# Patient Record
Sex: Female | Born: 1985 | Race: Black or African American | Hispanic: No | Marital: Single | Smoking: Current every day smoker
Health system: Southern US, Community
[De-identification: ages and names within clinical notes are randomized; demographics above are authoritative.]

## PROBLEM LIST (undated history)

## (undated) ENCOUNTER — Inpatient Hospital Stay (HOSPITAL_COMMUNITY): Payer: Self-pay

## (undated) ENCOUNTER — Inpatient Hospital Stay (HOSPITAL_COMMUNITY): Payer: Medicaid Other | Admitting: Obstetrics & Gynecology

## (undated) DIAGNOSIS — N750 Cyst of Bartholin's gland: Secondary | ICD-10-CM

## (undated) DIAGNOSIS — A64 Unspecified sexually transmitted disease: Secondary | ICD-10-CM

## (undated) DIAGNOSIS — R51 Headache: Secondary | ICD-10-CM

## (undated) DIAGNOSIS — F1910 Other psychoactive substance abuse, uncomplicated: Secondary | ICD-10-CM

## (undated) DIAGNOSIS — Z8742 Personal history of other diseases of the female genital tract: Secondary | ICD-10-CM

## (undated) DIAGNOSIS — R87629 Unspecified abnormal cytological findings in specimens from vagina: Secondary | ICD-10-CM

## (undated) DIAGNOSIS — N83209 Unspecified ovarian cyst, unspecified side: Secondary | ICD-10-CM

## (undated) HISTORY — PX: FRACTURE SURGERY: SHX138

## (undated) HISTORY — DX: Unspecified sexually transmitted disease: A64

## (undated) HISTORY — PX: WISDOM TOOTH EXTRACTION: SHX21

## (undated) HISTORY — PX: DILATION AND CURETTAGE OF UTERUS: SHX78

## (undated) HISTORY — PX: LAPAROSCOPY: SHX197

## (undated) HISTORY — DX: Personal history of other diseases of the female genital tract: Z87.42

---
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
## 2005-01-23 ENCOUNTER — Emergency Department (HOSPITAL_COMMUNITY): Admission: EM | Admit: 2005-01-23 | Discharge: 2005-01-23 | Payer: Self-pay | Admitting: Emergency Medicine

## 2005-09-10 ENCOUNTER — Emergency Department (HOSPITAL_COMMUNITY): Admission: EM | Admit: 2005-09-10 | Discharge: 2005-09-10 | Payer: Self-pay | Admitting: Emergency Medicine

## 2006-04-21 ENCOUNTER — Emergency Department (HOSPITAL_COMMUNITY): Admission: EM | Admit: 2006-04-21 | Discharge: 2006-04-21 | Payer: Self-pay | Admitting: Family Medicine

## 2006-06-10 ENCOUNTER — Emergency Department (HOSPITAL_COMMUNITY): Admission: EM | Admit: 2006-06-10 | Discharge: 2006-06-11 | Payer: Self-pay | Admitting: Emergency Medicine

## 2006-06-21 ENCOUNTER — Emergency Department (HOSPITAL_COMMUNITY): Admission: EM | Admit: 2006-06-21 | Discharge: 2006-06-22 | Payer: Self-pay | Admitting: Emergency Medicine

## 2006-08-23 ENCOUNTER — Emergency Department (HOSPITAL_COMMUNITY): Admission: EM | Admit: 2006-08-23 | Discharge: 2006-08-23 | Payer: Self-pay | Admitting: Emergency Medicine

## 2006-09-15 ENCOUNTER — Emergency Department (HOSPITAL_COMMUNITY): Admission: EM | Admit: 2006-09-15 | Discharge: 2006-09-15 | Payer: Self-pay | Admitting: Emergency Medicine

## 2006-09-22 ENCOUNTER — Emergency Department (HOSPITAL_COMMUNITY): Admission: EM | Admit: 2006-09-22 | Discharge: 2006-09-22 | Payer: Self-pay | Admitting: Emergency Medicine

## 2006-11-18 ENCOUNTER — Emergency Department (HOSPITAL_COMMUNITY): Admission: EM | Admit: 2006-11-18 | Discharge: 2006-11-18 | Payer: Self-pay | Admitting: Emergency Medicine

## 2006-12-01 ENCOUNTER — Emergency Department (HOSPITAL_COMMUNITY): Admission: EM | Admit: 2006-12-01 | Discharge: 2006-12-01 | Payer: Self-pay | Admitting: Emergency Medicine

## 2006-12-19 ENCOUNTER — Emergency Department (HOSPITAL_COMMUNITY): Admission: EM | Admit: 2006-12-19 | Discharge: 2006-12-19 | Payer: Self-pay | Admitting: Emergency Medicine

## 2007-01-05 ENCOUNTER — Emergency Department (HOSPITAL_COMMUNITY): Admission: EM | Admit: 2007-01-05 | Discharge: 2007-01-05 | Payer: Self-pay | Admitting: Emergency Medicine

## 2007-01-27 ENCOUNTER — Emergency Department (HOSPITAL_COMMUNITY): Admission: EM | Admit: 2007-01-27 | Discharge: 2007-01-27 | Payer: Self-pay | Admitting: Emergency Medicine

## 2007-04-10 ENCOUNTER — Emergency Department (HOSPITAL_COMMUNITY): Admission: EM | Admit: 2007-04-10 | Discharge: 2007-04-10 | Payer: Self-pay | Admitting: Emergency Medicine

## 2007-04-13 ENCOUNTER — Inpatient Hospital Stay (HOSPITAL_COMMUNITY): Admission: AD | Admit: 2007-04-13 | Discharge: 2007-04-13 | Payer: Self-pay | Admitting: Obstetrics & Gynecology

## 2007-04-16 ENCOUNTER — Other Ambulatory Visit: Payer: Self-pay | Admitting: Gynecology

## 2007-04-16 ENCOUNTER — Inpatient Hospital Stay (HOSPITAL_COMMUNITY): Admission: RE | Admit: 2007-04-16 | Discharge: 2007-04-18 | Payer: Self-pay | Admitting: *Deleted

## 2007-04-16 ENCOUNTER — Ambulatory Visit: Payer: Self-pay | Admitting: *Deleted

## 2007-06-10 ENCOUNTER — Emergency Department (HOSPITAL_COMMUNITY): Admission: EM | Admit: 2007-06-10 | Discharge: 2007-06-10 | Payer: Self-pay | Admitting: Emergency Medicine

## 2007-07-18 ENCOUNTER — Emergency Department (HOSPITAL_COMMUNITY): Admission: EM | Admit: 2007-07-18 | Discharge: 2007-07-18 | Payer: Self-pay | Admitting: Emergency Medicine

## 2007-07-25 ENCOUNTER — Inpatient Hospital Stay (HOSPITAL_COMMUNITY): Admission: AD | Admit: 2007-07-25 | Discharge: 2007-07-27 | Payer: Self-pay | Admitting: Obstetrics & Gynecology

## 2007-07-25 ENCOUNTER — Ambulatory Visit: Payer: Self-pay | Admitting: Obstetrics & Gynecology

## 2007-10-13 ENCOUNTER — Emergency Department (HOSPITAL_COMMUNITY): Admission: EM | Admit: 2007-10-13 | Discharge: 2007-10-13 | Payer: Self-pay | Admitting: Emergency Medicine

## 2007-11-29 ENCOUNTER — Emergency Department (HOSPITAL_COMMUNITY): Admission: EM | Admit: 2007-11-29 | Discharge: 2007-11-29 | Payer: Self-pay | Admitting: Family Medicine

## 2007-12-17 ENCOUNTER — Emergency Department (HOSPITAL_COMMUNITY): Admission: EM | Admit: 2007-12-17 | Discharge: 2007-12-17 | Payer: Self-pay | Admitting: Emergency Medicine

## 2008-01-11 ENCOUNTER — Emergency Department (HOSPITAL_COMMUNITY): Admission: EM | Admit: 2008-01-11 | Discharge: 2008-01-11 | Payer: Self-pay | Admitting: Emergency Medicine

## 2008-03-25 ENCOUNTER — Emergency Department (HOSPITAL_COMMUNITY): Admission: EM | Admit: 2008-03-25 | Discharge: 2008-03-25 | Payer: Self-pay | Admitting: Emergency Medicine

## 2008-06-28 ENCOUNTER — Emergency Department (HOSPITAL_COMMUNITY): Admission: EM | Admit: 2008-06-28 | Discharge: 2008-06-28 | Payer: Self-pay | Admitting: Emergency Medicine

## 2008-08-09 ENCOUNTER — Emergency Department (HOSPITAL_COMMUNITY): Admission: EM | Admit: 2008-08-09 | Discharge: 2008-08-09 | Payer: Self-pay | Admitting: Emergency Medicine

## 2008-09-05 ENCOUNTER — Emergency Department (HOSPITAL_COMMUNITY): Admission: EM | Admit: 2008-09-05 | Discharge: 2008-09-05 | Payer: Self-pay | Admitting: Emergency Medicine

## 2008-09-07 ENCOUNTER — Emergency Department (HOSPITAL_COMMUNITY): Admission: EM | Admit: 2008-09-07 | Discharge: 2008-09-07 | Payer: Self-pay | Admitting: Emergency Medicine

## 2008-09-26 ENCOUNTER — Emergency Department (HOSPITAL_COMMUNITY): Admission: EM | Admit: 2008-09-26 | Discharge: 2008-09-26 | Payer: Self-pay | Admitting: Emergency Medicine

## 2008-10-18 ENCOUNTER — Emergency Department: Payer: Self-pay | Admitting: Emergency Medicine

## 2008-10-20 ENCOUNTER — Emergency Department (HOSPITAL_COMMUNITY): Admission: EM | Admit: 2008-10-20 | Discharge: 2008-10-20 | Payer: Self-pay | Admitting: Emergency Medicine

## 2008-12-06 ENCOUNTER — Ambulatory Visit: Payer: Self-pay | Admitting: Obstetrics & Gynecology

## 2008-12-06 ENCOUNTER — Inpatient Hospital Stay (HOSPITAL_COMMUNITY): Admission: AD | Admit: 2008-12-06 | Discharge: 2008-12-08 | Payer: Self-pay | Admitting: Obstetrics & Gynecology

## 2008-12-10 ENCOUNTER — Inpatient Hospital Stay (HOSPITAL_COMMUNITY): Admission: AD | Admit: 2008-12-10 | Discharge: 2008-12-10 | Payer: Self-pay | Admitting: Obstetrics and Gynecology

## 2008-12-10 ENCOUNTER — Ambulatory Visit: Payer: Self-pay | Admitting: Family

## 2009-01-06 ENCOUNTER — Emergency Department (HOSPITAL_COMMUNITY): Admission: EM | Admit: 2009-01-06 | Discharge: 2009-01-06 | Payer: Self-pay | Admitting: Emergency Medicine

## 2009-01-09 ENCOUNTER — Inpatient Hospital Stay (HOSPITAL_COMMUNITY): Admission: AD | Admit: 2009-01-09 | Discharge: 2009-01-09 | Payer: Self-pay | Admitting: Obstetrics & Gynecology

## 2009-01-12 ENCOUNTER — Emergency Department (HOSPITAL_COMMUNITY): Admission: EM | Admit: 2009-01-12 | Discharge: 2009-01-12 | Payer: Self-pay | Admitting: Emergency Medicine

## 2009-01-18 ENCOUNTER — Emergency Department (HOSPITAL_COMMUNITY): Admission: EM | Admit: 2009-01-18 | Discharge: 2009-01-18 | Payer: Self-pay | Admitting: Emergency Medicine

## 2009-02-05 ENCOUNTER — Inpatient Hospital Stay (HOSPITAL_COMMUNITY): Admission: AD | Admit: 2009-02-05 | Discharge: 2009-02-05 | Payer: Self-pay | Admitting: Obstetrics & Gynecology

## 2009-02-22 ENCOUNTER — Emergency Department (HOSPITAL_COMMUNITY): Admission: EM | Admit: 2009-02-22 | Discharge: 2009-02-22 | Payer: Self-pay | Admitting: Emergency Medicine

## 2009-02-24 ENCOUNTER — Inpatient Hospital Stay (HOSPITAL_COMMUNITY): Admission: AD | Admit: 2009-02-24 | Discharge: 2009-02-24 | Payer: Self-pay | Admitting: Obstetrics & Gynecology

## 2009-04-05 ENCOUNTER — Inpatient Hospital Stay (HOSPITAL_COMMUNITY): Admission: AD | Admit: 2009-04-05 | Discharge: 2009-04-05 | Payer: Self-pay | Admitting: Obstetrics & Gynecology

## 2009-05-05 ENCOUNTER — Emergency Department (HOSPITAL_COMMUNITY): Admission: EM | Admit: 2009-05-05 | Discharge: 2009-05-05 | Payer: Self-pay | Admitting: Emergency Medicine

## 2009-05-06 ENCOUNTER — Emergency Department (HOSPITAL_COMMUNITY): Admission: EM | Admit: 2009-05-06 | Discharge: 2009-05-06 | Payer: Self-pay | Admitting: Emergency Medicine

## 2009-05-28 ENCOUNTER — Inpatient Hospital Stay (HOSPITAL_COMMUNITY): Admission: AD | Admit: 2009-05-28 | Discharge: 2009-05-28 | Payer: Self-pay | Admitting: Obstetrics and Gynecology

## 2009-05-28 ENCOUNTER — Ambulatory Visit: Payer: Self-pay | Admitting: Family Medicine

## 2009-06-11 ENCOUNTER — Ambulatory Visit: Payer: Self-pay | Admitting: Obstetrics and Gynecology

## 2009-06-11 LAB — CONVERTED CEMR LAB
Hemoglobin: 12.8 g/dL (ref 12.0–15.0)
RDW: 13.1 % (ref 11.5–15.5)

## 2009-07-30 ENCOUNTER — Inpatient Hospital Stay (HOSPITAL_COMMUNITY): Admission: AD | Admit: 2009-07-30 | Discharge: 2009-07-30 | Payer: Self-pay | Admitting: Obstetrics & Gynecology

## 2009-08-13 ENCOUNTER — Ambulatory Visit: Payer: Self-pay | Admitting: Obstetrics & Gynecology

## 2009-08-14 ENCOUNTER — Encounter: Payer: Self-pay | Admitting: Obstetrics and Gynecology

## 2009-08-19 ENCOUNTER — Emergency Department (HOSPITAL_COMMUNITY): Admission: EM | Admit: 2009-08-19 | Discharge: 2009-08-19 | Payer: Self-pay | Admitting: Emergency Medicine

## 2009-08-19 ENCOUNTER — Ambulatory Visit: Payer: Self-pay | Admitting: Obstetrics & Gynecology

## 2009-08-23 ENCOUNTER — Inpatient Hospital Stay (HOSPITAL_COMMUNITY): Admission: AD | Admit: 2009-08-23 | Discharge: 2009-08-23 | Payer: Self-pay | Admitting: Obstetrics and Gynecology

## 2009-08-31 ENCOUNTER — Ambulatory Visit: Payer: Self-pay | Admitting: Obstetrics & Gynecology

## 2009-08-31 ENCOUNTER — Ambulatory Visit (HOSPITAL_COMMUNITY): Admission: RE | Admit: 2009-08-31 | Discharge: 2009-08-31 | Payer: Self-pay | Admitting: Obstetrics & Gynecology

## 2009-09-03 ENCOUNTER — Inpatient Hospital Stay (HOSPITAL_COMMUNITY): Admission: AD | Admit: 2009-09-03 | Discharge: 2009-09-03 | Payer: Self-pay | Admitting: Obstetrics & Gynecology

## 2009-09-05 ENCOUNTER — Ambulatory Visit: Payer: Self-pay | Admitting: Physician Assistant

## 2009-09-05 ENCOUNTER — Inpatient Hospital Stay (HOSPITAL_COMMUNITY): Admission: AD | Admit: 2009-09-05 | Discharge: 2009-09-05 | Payer: Self-pay | Admitting: Obstetrics and Gynecology

## 2009-09-16 ENCOUNTER — Emergency Department (HOSPITAL_COMMUNITY): Admission: EM | Admit: 2009-09-16 | Discharge: 2009-09-16 | Payer: Self-pay | Admitting: Emergency Medicine

## 2009-10-03 ENCOUNTER — Emergency Department (HOSPITAL_COMMUNITY): Admission: EM | Admit: 2009-10-03 | Discharge: 2009-10-03 | Payer: Self-pay | Admitting: Emergency Medicine

## 2009-10-04 ENCOUNTER — Emergency Department (HOSPITAL_COMMUNITY): Admission: EM | Admit: 2009-10-04 | Discharge: 2009-10-04 | Payer: Self-pay | Admitting: Emergency Medicine

## 2009-10-20 ENCOUNTER — Inpatient Hospital Stay (HOSPITAL_COMMUNITY): Admission: AD | Admit: 2009-10-20 | Discharge: 2009-10-20 | Payer: Self-pay | Admitting: Family Medicine

## 2009-10-20 ENCOUNTER — Ambulatory Visit: Payer: Self-pay | Admitting: Gynecology

## 2009-10-30 ENCOUNTER — Ambulatory Visit: Payer: Self-pay | Admitting: Nurse Practitioner

## 2009-10-30 ENCOUNTER — Inpatient Hospital Stay (HOSPITAL_COMMUNITY): Admission: AD | Admit: 2009-10-30 | Discharge: 2009-10-30 | Payer: Self-pay | Admitting: Obstetrics & Gynecology

## 2009-11-15 ENCOUNTER — Emergency Department (HOSPITAL_COMMUNITY): Admission: EM | Admit: 2009-11-15 | Discharge: 2009-11-15 | Payer: Self-pay | Admitting: Emergency Medicine

## 2009-11-22 ENCOUNTER — Emergency Department (HOSPITAL_COMMUNITY): Admission: EM | Admit: 2009-11-22 | Discharge: 2009-11-22 | Payer: Self-pay | Admitting: Emergency Medicine

## 2009-11-23 ENCOUNTER — Ambulatory Visit: Payer: Self-pay | Admitting: Family

## 2009-11-23 ENCOUNTER — Emergency Department (HOSPITAL_COMMUNITY): Admission: EM | Admit: 2009-11-23 | Discharge: 2009-11-23 | Payer: Self-pay | Admitting: Emergency Medicine

## 2009-11-23 ENCOUNTER — Inpatient Hospital Stay (HOSPITAL_COMMUNITY)
Admission: AD | Admit: 2009-11-23 | Discharge: 2009-11-23 | Payer: Self-pay | Source: Home / Self Care | Admitting: Obstetrics & Gynecology

## 2009-11-26 ENCOUNTER — Other Ambulatory Visit: Payer: Self-pay | Admitting: Obstetrics & Gynecology

## 2009-11-26 ENCOUNTER — Emergency Department (HOSPITAL_COMMUNITY): Admission: EM | Admit: 2009-11-26 | Discharge: 2009-11-27 | Payer: Self-pay | Admitting: Emergency Medicine

## 2009-12-13 ENCOUNTER — Inpatient Hospital Stay (HOSPITAL_COMMUNITY): Admission: AD | Admit: 2009-12-13 | Discharge: 2009-12-13 | Payer: Self-pay | Admitting: Obstetrics and Gynecology

## 2009-12-13 ENCOUNTER — Ambulatory Visit: Payer: Self-pay | Admitting: Nurse Practitioner

## 2010-02-28 ENCOUNTER — Inpatient Hospital Stay (HOSPITAL_COMMUNITY): Admission: AD | Admit: 2010-02-28 | Discharge: 2010-02-28 | Payer: Self-pay | Admitting: Obstetrics & Gynecology

## 2010-03-29 ENCOUNTER — Ambulatory Visit: Payer: Self-pay | Admitting: Family Medicine

## 2010-04-05 ENCOUNTER — Ambulatory Visit: Payer: Self-pay | Admitting: Physician Assistant

## 2010-05-16 ENCOUNTER — Encounter: Payer: Self-pay | Admitting: Obstetrics and Gynecology

## 2010-05-16 ENCOUNTER — Encounter: Payer: Self-pay | Admitting: Obstetrics

## 2010-05-16 ENCOUNTER — Encounter: Payer: Self-pay | Admitting: *Deleted

## 2010-07-06 LAB — URINALYSIS, ROUTINE W REFLEX MICROSCOPIC
Glucose, UA: NEGATIVE mg/dL
Leukocytes, UA: NEGATIVE
Protein, ur: NEGATIVE mg/dL
Urobilinogen, UA: 0.2 mg/dL (ref 0.0–1.0)

## 2010-07-06 LAB — URINE MICROSCOPIC-ADD ON

## 2010-07-08 LAB — CBC
MCHC: 33.4 g/dL (ref 30.0–36.0)
Platelets: 388 10*3/uL (ref 150–400)
RDW: 15.1 % (ref 11.5–15.5)
WBC: 4.9 10*3/uL (ref 4.0–10.5)

## 2010-07-08 LAB — SAMPLE TO BLOOD BANK

## 2010-07-08 LAB — HCG, SERUM, QUALITATIVE: Preg, Serum: NEGATIVE

## 2010-07-09 LAB — COMPREHENSIVE METABOLIC PANEL
AST: 16 U/L (ref 0–37)
Albumin: 3.5 g/dL (ref 3.5–5.2)
Alkaline Phosphatase: 57 U/L (ref 39–117)
BUN: 10 mg/dL (ref 6–23)
Chloride: 105 mEq/L (ref 96–112)
GFR calc Af Amer: >60 mL/min (ref >60)
Potassium: 3.9 mEq/L (ref 3.5–5.1)
Sodium: 136 mEq/L (ref 135–145)
Total Protein: 7.6 g/dL (ref 6.0–8.3)

## 2010-07-09 LAB — URINE MICROSCOPIC-ADD ON

## 2010-07-09 LAB — URINALYSIS, ROUTINE W REFLEX MICROSCOPIC
Glucose, UA: NEGATIVE mg/dL
Protein, ur: 100 mg/dL — AB

## 2010-07-09 LAB — CBC
Platelets: 322 10*3/uL (ref 150–400)
RBC: 4.13 MIL/uL (ref 3.87–5.11)
RDW: 13.6 % (ref 11.5–15.5)
WBC: 7.2 10*3/uL (ref 4.0–10.5)

## 2010-07-09 LAB — POCT PREGNANCY, URINE: Preg Test, Ur: NEGATIVE

## 2010-07-10 LAB — CBC
MCH: 32.6 pg (ref 26.0–34.0)
MCV: 93.9 fL (ref 78.0–100.0)
Platelets: 319 10*3/uL (ref 150–400)
RBC: 3.88 MIL/uL (ref 3.87–5.11)
RDW: 14.2 % (ref 11.5–15.5)

## 2010-07-10 LAB — HEPATIC FUNCTION PANEL
Alkaline Phosphatase: 43 U/L (ref 39–117)
Bilirubin, Direct: 0.1 mg/dL (ref 0.0–0.3)
Indirect Bilirubin: 0.8 mg/dL (ref 0.3–0.9)
Total Protein: 6.8 g/dL (ref 6.0–8.3)

## 2010-07-10 LAB — URINALYSIS, ROUTINE W REFLEX MICROSCOPIC
Bilirubin Urine: NEGATIVE
Glucose, UA: NEGATIVE mg/dL
Protein, ur: NEGATIVE mg/dL
Protein, ur: NEGATIVE mg/dL
Urobilinogen, UA: 0.2 mg/dL (ref 0.0–1.0)
Urobilinogen, UA: 0.2 mg/dL (ref 0.0–1.0)
pH: 6 (ref 5.0–8.0)

## 2010-07-10 LAB — POCT I-STAT, CHEM 8
Calcium, Ion: 1.13 mmol/L (ref 1.12–1.32)
Chloride: 107 mEq/L (ref 96–112)
HCT: 41 % (ref 36.0–46.0)
Hemoglobin: 13.9 g/dL (ref 12.0–15.0)
TCO2: 21 mmol/L (ref 0–100)

## 2010-07-10 LAB — PREGNANCY, URINE: Preg Test, Ur: NEGATIVE

## 2010-07-10 LAB — DIFFERENTIAL
Eosinophils Absolute: 0 10*3/uL (ref 0.0–0.7)
Eosinophils Relative: 0 % (ref 0–5)
Lymphs Abs: 1.6 10*3/uL (ref 0.7–4.0)

## 2010-07-10 LAB — URINE MICROSCOPIC-ADD ON

## 2010-07-10 LAB — LIPASE, BLOOD: Lipase: 44 U/L (ref 11–59)

## 2010-07-10 LAB — POCT PREGNANCY, URINE: Preg Test, Ur: NEGATIVE

## 2010-07-11 LAB — HERPES SIMPLEX VIRUS CULTURE

## 2010-07-11 LAB — URINALYSIS, ROUTINE W REFLEX MICROSCOPIC
Bilirubin Urine: NEGATIVE
Bilirubin Urine: NEGATIVE
Glucose, UA: NEGATIVE mg/dL
Ketones, ur: NEGATIVE mg/dL
Ketones, ur: NEGATIVE mg/dL
Ketones, ur: 15 mg/dL — AB
Nitrite: NEGATIVE
Nitrite: NEGATIVE
Protein, ur: NEGATIVE mg/dL
Protein, ur: NEGATIVE mg/dL
Protein, ur: NEGATIVE mg/dL
Specific Gravity, Urine: 1.025 (ref 1.005–1.030)
Urobilinogen, UA: 0.2 mg/dL (ref 0.0–1.0)
Urobilinogen, UA: 0.2 mg/dL (ref 0.0–1.0)
pH: 6.5 (ref 5.0–8.0)

## 2010-07-11 LAB — POCT PREGNANCY, URINE: Preg Test, Ur: NEGATIVE

## 2010-07-11 LAB — GC/CHLAMYDIA PROBE AMP, GENITAL
Chlamydia, DNA Probe: NEGATIVE
GC Probe Amp, Genital: NEGATIVE

## 2010-07-11 LAB — WET PREP, GENITAL
Trich, Wet Prep: NONE SEEN
Trich, Wet Prep: NONE SEEN
Yeast Wet Prep HPF POC: NONE SEEN

## 2010-07-11 LAB — URINE MICROSCOPIC-ADD ON

## 2010-07-11 LAB — CBC
HCT: 38.5 % (ref 36.0–46.0)
HCT: 39.3 % (ref 36.0–46.0)
MCHC: 33.6 g/dL (ref 30.0–36.0)
MCV: 93.9 fL (ref 78.0–100.0)
MCV: 94.7 fL (ref 78.0–100.0)
Platelets: 378 10*3/uL (ref 150–400)
RBC: 4.1 MIL/uL (ref 3.87–5.11)
RDW: 14.1 % (ref 11.5–15.5)
WBC: 5.4 10*3/uL (ref 4.0–10.5)
WBC: 6.7 10*3/uL (ref 4.0–10.5)

## 2010-07-11 LAB — SAMPLE TO BLOOD BANK

## 2010-07-12 LAB — DIFFERENTIAL
Basophils Absolute: 0 10*3/uL (ref 0.0–0.1)
Basophils Absolute: 0 10*3/uL (ref 0.0–0.1)
Basophils Relative: 1 % (ref 0–1)
Basophils Relative: 0 % (ref 0–1)
Eosinophils Absolute: 0 10*3/uL (ref 0.0–0.7)
Eosinophils Absolute: 0 10*3/uL (ref 0.0–0.7)
Eosinophils Relative: 0 % (ref 0–5)
Lymphs Abs: 1.7 10*3/uL (ref 0.7–4.0)
Monocytes Relative: 6 % (ref 3–12)
Neutro Abs: 12.2 10*3/uL — ABNORMAL HIGH (ref 1.7–7.7)
Neutro Abs: 9.8 10*3/uL — ABNORMAL HIGH (ref 1.7–7.7)
Neutrophils Relative %: 81 % — ABNORMAL HIGH (ref 43–77)
Neutrophils Relative %: 83 % — ABNORMAL HIGH (ref 43–77)
Neutrophils Relative %: 66 % (ref 43–77)

## 2010-07-12 LAB — URINALYSIS, ROUTINE W REFLEX MICROSCOPIC
Bilirubin Urine: NEGATIVE
Ketones, ur: 15 mg/dL — AB
Nitrite: NEGATIVE
Protein, ur: NEGATIVE mg/dL
Protein, ur: NEGATIVE mg/dL
Specific Gravity, Urine: 1.015 (ref 1.005–1.030)
Urobilinogen, UA: 0.2 mg/dL (ref 0.0–1.0)
pH: 6.5 (ref 5.0–8.0)

## 2010-07-12 LAB — CBC
HCT: 38 % (ref 36.0–46.0)
MCHC: 34.1 g/dL (ref 30.0–36.0)
MCV: 94.6 fL (ref 78.0–100.0)
MCV: 94.5 fL (ref 78.0–100.0)
Platelets: 302 10*3/uL (ref 150–400)
Platelets: 320 10*3/uL (ref 150–400)
Platelets: 351 10*3/uL (ref 150–400)
RBC: 3.77 MIL/uL — ABNORMAL LOW (ref 3.87–5.11)
RBC: 3.95 MIL/uL (ref 3.87–5.11)
RDW: 13.9 % (ref 11.5–15.5)
RDW: 13.6 % (ref 11.5–15.5)
WBC: 15.1 10*3/uL — ABNORMAL HIGH (ref 4.0–10.5)

## 2010-07-12 LAB — POCT I-STAT, CHEM 8
BUN: 8 mg/dL (ref 6–23)
Creatinine, Ser: 0.7 mg/dL (ref 0.4–1.2)
Hemoglobin: 12.9 g/dL (ref 12.0–15.0)
Potassium: 3.7 mEq/L (ref 3.5–5.1)
Sodium: 137 mEq/L (ref 135–145)
TCO2: 21 mmol/L (ref 0–100)

## 2010-07-12 LAB — BASIC METABOLIC PANEL
BUN: 5 mg/dL — ABNORMAL LOW (ref 6–23)
Chloride: 107 mEq/L (ref 96–112)
Creatinine, Ser: 0.7 mg/dL (ref 0.4–1.2)
GFR calc non Af Amer: >60 mL/min (ref >60)
Glucose, Bld: 109 mg/dL — ABNORMAL HIGH (ref 70–99)
Potassium: 3.5 mEq/L (ref 3.5–5.1)

## 2010-07-12 LAB — RAPID URINE DRUG SCREEN, HOSP PERFORMED
Amphetamines: NOT DETECTED
Barbiturates: NOT DETECTED
Cocaine: NOT DETECTED
Opiates: NOT DETECTED

## 2010-07-12 LAB — GC/CHLAMYDIA PROBE AMP, GENITAL: GC Probe Amp, Genital: NEGATIVE

## 2010-07-12 LAB — URINE MICROSCOPIC-ADD ON

## 2010-07-12 LAB — D-DIMER, QUANTITATIVE: D-Dimer, Quant: 0.26 ug/mL-FEU (ref 0.00–0.48)

## 2010-07-12 LAB — POCT PREGNANCY, URINE: Preg Test, Ur: NEGATIVE

## 2010-07-12 LAB — RAPID STREP SCREEN (MED CTR MEBANE ONLY): Streptococcus, Group A Screen (Direct): NEGATIVE

## 2010-07-13 LAB — CBC
HCT: 37.8 % (ref 36.0–46.0)
HCT: 41.2 % (ref 36.0–46.0)
Hemoglobin: 14 g/dL (ref 12.0–15.0)
MCHC: 34.1 g/dL (ref 30.0–36.0)
MCHC: 34.4 g/dL (ref 30.0–36.0)
MCV: 94.3 fL (ref 78.0–100.0)
MCV: 94.8 fL (ref 78.0–100.0)
Platelets: 365 10*3/uL (ref 150–400)
Platelets: 392 10*3/uL (ref 150–400)
RBC: 4.36 MIL/uL (ref 3.87–5.11)
RDW: 14.3 % (ref 11.5–15.5)
RDW: 14.5 % (ref 11.5–15.5)

## 2010-07-13 LAB — PREGNANCY, URINE: Preg Test, Ur: NEGATIVE

## 2010-07-14 LAB — CBC
Hemoglobin: 13.4 g/dL (ref 12.0–15.0)
Hemoglobin: 13.1 g/dL (ref 12.0–15.0)
MCHC: 33.4 g/dL (ref 30.0–36.0)
MCHC: 33.6 g/dL (ref 30.0–36.0)
RBC: 4.14 MIL/uL (ref 3.87–5.11)
RDW: 14.5 % (ref 11.5–15.5)

## 2010-07-14 LAB — URINALYSIS, ROUTINE W REFLEX MICROSCOPIC
Bilirubin Urine: NEGATIVE
Ketones, ur: NEGATIVE mg/dL
Nitrite: NEGATIVE
Nitrite: NEGATIVE
Protein, ur: NEGATIVE mg/dL
Specific Gravity, Urine: 1.02 (ref 1.005–1.030)
Specific Gravity, Urine: 1.02 (ref 1.005–1.030)
Urobilinogen, UA: 0.2 mg/dL (ref 0.0–1.0)
pH: 5.5 (ref 5.0–8.0)

## 2010-07-14 LAB — URINE MICROSCOPIC-ADD ON

## 2010-07-14 LAB — COMPREHENSIVE METABOLIC PANEL
ALT: 33 U/L (ref 0–35)
Alkaline Phosphatase: 40 U/L (ref 39–117)
CO2: 26 mEq/L (ref 19–32)
Calcium: 9.8 mg/dL (ref 8.4–10.5)
Chloride: 105 mEq/L (ref 96–112)
GFR calc non Af Amer: >60 mL/min (ref >60)
Glucose, Bld: 96 mg/dL (ref 70–99)
Sodium: 138 mEq/L (ref 135–145)
Total Bilirubin: 0.3 mg/dL (ref 0.3–1.2)

## 2010-07-14 LAB — RAPID URINE DRUG SCREEN, HOSP PERFORMED
Amphetamines: NOT DETECTED
Barbiturates: NOT DETECTED
Cocaine: NOT DETECTED
Opiates: POSITIVE — AB
Tetrahydrocannabinol: POSITIVE — AB

## 2010-07-14 LAB — WET PREP, GENITAL

## 2010-07-14 LAB — GC/CHLAMYDIA PROBE AMP, GENITAL
Chlamydia, DNA Probe: NEGATIVE
GC Probe Amp, Genital: NEGATIVE

## 2010-07-14 LAB — LIPASE, BLOOD: Lipase: 32 U/L (ref 11–59)

## 2010-07-23 ENCOUNTER — Ambulatory Visit (HOSPITAL_COMMUNITY)
Admission: RE | Admit: 2010-07-23 | Discharge: 2010-07-23 | Disposition: A | Discharge status: Home / Self Care | Payer: Medicaid Other | Source: Ambulatory Visit | Attending: Obstetrics & Gynecology | Admitting: Obstetrics & Gynecology

## 2010-07-23 ENCOUNTER — Other Ambulatory Visit: Payer: Self-pay | Admitting: Obstetrics & Gynecology

## 2010-07-23 DIAGNOSIS — N949 Unspecified condition associated with female genital organs and menstrual cycle: Secondary | ICD-10-CM | POA: Insufficient documentation

## 2010-07-23 DIAGNOSIS — N938 Other specified abnormal uterine and vaginal bleeding: Secondary | ICD-10-CM | POA: Insufficient documentation

## 2010-07-23 LAB — PREGNANCY, URINE: Preg Test, Ur: NEGATIVE

## 2010-07-23 LAB — CBC
MCH: 31 pg (ref 26.0–34.0)
MCHC: 34.4 g/dL (ref 30.0–36.0)
MCV: 90.1 fL (ref 78.0–100.0)
Platelets: 374 10*3/uL (ref 150–400)

## 2010-07-27 LAB — COMPREHENSIVE METABOLIC PANEL
Albumin: 3.3 g/dL — ABNORMAL LOW (ref 3.5–5.2)
Alkaline Phosphatase: 39 U/L (ref 39–117)
BUN: 5 mg/dL — ABNORMAL LOW (ref 6–23)
Calcium: 8.7 mg/dL (ref 8.4–10.5)
Glucose, Bld: 84 mg/dL (ref 70–99)
Potassium: 4.3 mEq/L (ref 3.5–5.1)
Sodium: 138 mEq/L (ref 135–145)
Total Protein: 6.3 g/dL (ref 6.0–8.3)

## 2010-07-27 LAB — URINALYSIS, ROUTINE W REFLEX MICROSCOPIC
Glucose, UA: NEGATIVE mg/dL
Leukocytes, UA: NEGATIVE
Protein, ur: NEGATIVE mg/dL
pH: 5 (ref 5.0–8.0)

## 2010-07-27 LAB — POCT PREGNANCY, URINE: Preg Test, Ur: NEGATIVE

## 2010-07-27 LAB — WET PREP, GENITAL: Yeast Wet Prep HPF POC: NONE SEEN

## 2010-07-27 LAB — CBC
HCT: 35.6 % — ABNORMAL LOW (ref 36.0–46.0)
MCHC: 33.9 g/dL (ref 30.0–36.0)
Platelets: 355 10*3/uL (ref 150–400)
RDW: 13.4 % (ref 11.5–15.5)

## 2010-07-27 LAB — URINE MICROSCOPIC-ADD ON

## 2010-07-28 LAB — COMPREHENSIVE METABOLIC PANEL
BUN: 6 mg/dL (ref 6–23)
Calcium: 9 mg/dL (ref 8.4–10.5)
Glucose, Bld: 93 mg/dL (ref 70–99)
Sodium: 139 mEq/L (ref 135–145)
Total Protein: 7.1 g/dL (ref 6.0–8.3)

## 2010-07-28 LAB — GC/CHLAMYDIA PROBE AMP, GENITAL
Chlamydia, DNA Probe: NEGATIVE
GC Probe Amp, Genital: NEGATIVE

## 2010-07-28 LAB — URINALYSIS, ROUTINE W REFLEX MICROSCOPIC
Glucose, UA: NEGATIVE mg/dL
Protein, ur: NEGATIVE mg/dL
Specific Gravity, Urine: 1.02 (ref 1.005–1.030)
pH: 5.5 (ref 5.0–8.0)

## 2010-07-28 LAB — WET PREP, GENITAL
Trich, Wet Prep: NONE SEEN
Yeast Wet Prep HPF POC: NONE SEEN

## 2010-07-28 LAB — RAPID URINE DRUG SCREEN, HOSP PERFORMED
Amphetamines: NOT DETECTED
Barbiturates: NOT DETECTED
Opiates: POSITIVE — AB

## 2010-07-28 LAB — POCT PREGNANCY, URINE: Preg Test, Ur: NEGATIVE

## 2010-07-28 LAB — CBC
HCT: 40.1 % (ref 36.0–46.0)
Hemoglobin: 13.5 g/dL (ref 12.0–15.0)
MCHC: 33.7 g/dL (ref 30.0–36.0)
RDW: 14.2 % (ref 11.5–15.5)

## 2010-07-28 LAB — URINE MICROSCOPIC-ADD ON

## 2010-07-28 NOTE — Op Note (Signed)
  [name], [name]               ACCOUNT NO.:  [id number]  MEDICAL RECORD NO.:  [id number]           PATIENT TYPE:  O  LOCATION:  [facility]                          [facility]  [facility]  PHYSICIAN:  [name], M.D.DATE OF BIRTH:  14-Sep-1985  DATE OF PROCEDURE:  07/23/2010 DATE OF DISCHARGE:                              OPERATIVE REPORT   PREOPERATIVE DIAGNOSIS:  Abnormal uterine bleeding.  POSTOPERATIVE DIAGNOSIS:  Abnormal uterine bleeding.  PROCEDURE:  Diagnostic hysteroscopy, dilatation and curettage.  SURGEON:  [name], M.D.  ANESTHESIA:  Laryngeal mask airway, paracervical block.  FINDINGS:  The endometrial lining was thin.  There were no discrete lesions. The tubal ostia were well visualized bilaterally.  SPECIMEN:  Endometrial curettings to Pathology.  ESTIMATED BLOOD LOSS:  Minimal.  COMPLICATIONS:  None.  PROCEDURE:  The patient was taken to the operating room with an IV running.  A laryngeal mask airway was then placed.  She was then placed in a semi-lithotomy position in [place] stirrups and prepped and draped in the usual sterile fashion.  After time-out had been completed, a speculum was placed in the patient's vagina.  The anterior lip of the cervix was then infiltrated with 2 mL of 2% lidocaine plain.  The single- tooth tenaculum was then applied to this location.  A 4 mL of 2% lidocaine plain were then injected at 4 and 7 o'clock to produce a paracervical block.  The hysteroscope was then advanced into the cervix and gradually into the uterine cavity.  The above findings were noted. The hysteroscope was then removed.  A sharp curettage was then performed.  The single-tooth tenaculum was then removed with minimal bleeding noted from the cervix.  At the close of the procedure, the instrument and pack counts were said to be correct x2.  The patient was taken to the PACU awake and in stable condition.     [name],  M.D.    Judee Clara  D:  07/23/2010  T:  07/24/2010  Job:  [id number]  Electronically Signed by Antionette Char M.D. on 07/28/2010 10:00:59 PM

## 2010-07-29 LAB — URINALYSIS, ROUTINE W REFLEX MICROSCOPIC
Bilirubin Urine: NEGATIVE
Glucose, UA: NEGATIVE mg/dL
Ketones, ur: 15 mg/dL — AB
Ketones, ur: NEGATIVE mg/dL
Nitrite: NEGATIVE
Protein, ur: NEGATIVE mg/dL
Protein, ur: NEGATIVE mg/dL
Specific Gravity, Urine: 1.01 (ref 1.005–1.030)
Urobilinogen, UA: 0.2 mg/dL (ref 0.0–1.0)

## 2010-07-29 LAB — COMPREHENSIVE METABOLIC PANEL
AST: 19 U/L (ref 0–37)
Alkaline Phosphatase: 46 U/L (ref 39–117)
BUN: 5 mg/dL — ABNORMAL LOW (ref 6–23)
CO2: 27 mEq/L (ref 19–32)
Chloride: 105 mEq/L (ref 96–112)
Creatinine, Ser: 0.79 mg/dL (ref 0.4–1.2)
GFR calc Af Amer: >60 mL/min (ref >60)
GFR calc non Af Amer: >60 mL/min (ref >60)
Potassium: 4.1 mEq/L (ref 3.5–5.1)
Total Bilirubin: 0.7 mg/dL (ref 0.3–1.2)

## 2010-07-29 LAB — URINE MICROSCOPIC-ADD ON

## 2010-07-29 LAB — CBC
HCT: 39.5 % (ref 36.0–46.0)
MCV: 95.8 fL (ref 78.0–100.0)
RBC: 4.13 MIL/uL (ref 3.87–5.11)
WBC: 6.5 10*3/uL (ref 4.0–10.5)

## 2010-07-29 LAB — POCT PREGNANCY, URINE: Preg Test, Ur: NEGATIVE

## 2010-07-29 LAB — WET PREP, GENITAL
Clue Cells Wet Prep HPF POC: NONE SEEN
Trich, Wet Prep: NONE SEEN
Yeast Wet Prep HPF POC: NONE SEEN

## 2010-07-29 LAB — GC/CHLAMYDIA PROBE AMP, GENITAL
Chlamydia, DNA Probe: NEGATIVE
GC Probe Amp, Genital: NEGATIVE

## 2010-07-30 LAB — WET PREP, GENITAL
Trich, Wet Prep: NONE SEEN
Trich, Wet Prep: NONE SEEN
Yeast Wet Prep HPF POC: NONE SEEN
Yeast Wet Prep HPF POC: NONE SEEN

## 2010-07-30 LAB — URINALYSIS, ROUTINE W REFLEX MICROSCOPIC
Bilirubin Urine: NEGATIVE
Bilirubin Urine: NEGATIVE
Glucose, UA: NEGATIVE mg/dL
Ketones, ur: NEGATIVE mg/dL
Ketones, ur: NEGATIVE mg/dL
Leukocytes, UA: NEGATIVE
Nitrite: NEGATIVE
Nitrite: NEGATIVE
Protein, ur: NEGATIVE mg/dL
Protein, ur: NEGATIVE mg/dL
Specific Gravity, Urine: 1.014 (ref 1.005–1.030)
Urobilinogen, UA: 0.2 mg/dL (ref 0.0–1.0)
Urobilinogen, UA: 0.2 mg/dL (ref 0.0–1.0)
pH: 6 (ref 5.0–8.0)

## 2010-07-30 LAB — DIFFERENTIAL
Basophils Relative: 0 % (ref 0–1)
Eosinophils Absolute: 0.2 10*3/uL (ref 0.0–0.7)
Eosinophils Relative: 3 % (ref 0–5)
Lymphs Abs: 1.6 10*3/uL (ref 0.7–4.0)
Neutrophils Relative %: 66 % (ref 43–77)

## 2010-07-30 LAB — HERPES SIMPLEX VIRUS CULTURE: Culture: DETECTED

## 2010-07-30 LAB — URINE MICROSCOPIC-ADD ON

## 2010-07-30 LAB — CBC
HCT: 39.3 % (ref 36.0–46.0)
MCHC: 33.4 g/dL (ref 30.0–36.0)
MCV: 95.6 fL (ref 78.0–100.0)
Platelets: 322 10*3/uL (ref 150–400)
RDW: 13.9 % (ref 11.5–15.5)

## 2010-07-30 LAB — POCT I-STAT, CHEM 8
BUN: 8 mg/dL (ref 6–23)
Calcium, Ion: 1.16 mmol/L (ref 1.12–1.32)
Hemoglobin: 14.6 g/dL (ref 12.0–15.0)
Sodium: 139 mEq/L (ref 135–145)
TCO2: 23 mmol/L (ref 0–100)

## 2010-07-30 LAB — PREGNANCY, URINE: Preg Test, Ur: NEGATIVE

## 2010-07-30 LAB — GC/CHLAMYDIA PROBE AMP, GENITAL
Chlamydia, DNA Probe: NEGATIVE
Chlamydia, DNA Probe: NEGATIVE
GC Probe Amp, Genital: NEGATIVE
GC Probe Amp, Genital: NEGATIVE

## 2010-07-30 LAB — POCT PREGNANCY, URINE: Preg Test, Ur: NEGATIVE

## 2010-07-31 LAB — URINE MICROSCOPIC-ADD ON

## 2010-07-31 LAB — URINALYSIS, ROUTINE W REFLEX MICROSCOPIC
Bilirubin Urine: NEGATIVE
Glucose, UA: NEGATIVE mg/dL
Glucose, UA: NEGATIVE mg/dL
Ketones, ur: NEGATIVE mg/dL
Ketones, ur: NEGATIVE mg/dL
Leukocytes, UA: NEGATIVE
Nitrite: NEGATIVE
Protein, ur: NEGATIVE mg/dL
Protein, ur: NEGATIVE mg/dL
Specific Gravity, Urine: 1.02 (ref 1.005–1.030)
Urobilinogen, UA: 0.2 mg/dL (ref 0.0–1.0)
Urobilinogen, UA: 0.2 mg/dL (ref 0.0–1.0)
pH: 5.5 (ref 5.0–8.0)

## 2010-07-31 LAB — WET PREP, GENITAL
Trich, Wet Prep: NONE SEEN
Yeast Wet Prep HPF POC: NONE SEEN

## 2010-07-31 LAB — CBC
HCT: 40.6 % (ref 36.0–46.0)
HCT: 38 % (ref 36.0–46.0)
Hemoglobin: 12.9 g/dL (ref 12.0–15.0)
MCHC: 33.6 g/dL (ref 30.0–36.0)
MCHC: 34 g/dL (ref 30.0–36.0)
MCV: 96.4 fL (ref 78.0–100.0)
MCV: 96.7 fL (ref 78.0–100.0)
Platelets: 323 K/uL (ref 150–400)
RBC: 4.21 MIL/uL (ref 3.87–5.11)
RBC: 3.93 MIL/uL (ref 3.87–5.11)
RDW: 13.6 % (ref 11.5–15.5)
WBC: 8.1 10*3/uL (ref 4.0–10.5)
WBC: 9.8 K/uL (ref 4.0–10.5)

## 2010-07-31 LAB — DIFFERENTIAL
Eosinophils Absolute: 0.2 10*3/uL (ref 0.0–0.7)
Eosinophils Relative: 2 % (ref 0–5)
Lymphs Abs: 2.7 10*3/uL (ref 0.7–4.0)
Monocytes Relative: 8 % (ref 3–12)

## 2010-07-31 LAB — CREATININE, SERUM
Creatinine, Ser: 0.88 mg/dL (ref 0.4–1.2)
GFR calc non Af Amer: >60 mL/min

## 2010-07-31 LAB — POCT PREGNANCY, URINE: Preg Test, Ur: NEGATIVE

## 2010-08-02 LAB — URINE MICROSCOPIC-ADD ON

## 2010-08-02 LAB — CBC
HCT: 40.6 % (ref 36.0–46.0)
Hemoglobin: 13.6 g/dL (ref 12.0–15.0)
MCHC: 33.8 g/dL (ref 30.0–36.0)
MCHC: 33.6 g/dL (ref 30.0–36.0)
MCV: 95.5 fL (ref 78.0–100.0)
MCV: 94.9 fL (ref 78.0–100.0)
Platelets: 342 10*3/uL (ref 150–400)
RBC: 4.17 MIL/uL (ref 3.87–5.11)
RBC: 4.28 MIL/uL (ref 3.87–5.11)
RDW: 13.7 % (ref 11.5–15.5)
WBC: 7.2 10*3/uL (ref 4.0–10.5)

## 2010-08-02 LAB — DIFFERENTIAL
Basophils Absolute: 0 10*3/uL (ref 0.0–0.1)
Basophils Relative: 1 % (ref 0–1)
Basophils Relative: 0 % (ref 0–1)
Eosinophils Absolute: 0.1 10*3/uL (ref 0.0–0.7)
Eosinophils Absolute: 0.1 10*3/uL (ref 0.0–0.7)
Eosinophils Relative: 2 % (ref 0–5)
Lymphocytes Relative: 28 % (ref 12–46)
Lymphs Abs: 2 10*3/uL (ref 0.7–4.0)
Monocytes Absolute: 0.7 10*3/uL (ref 0.1–1.0)
Monocytes Absolute: 0.6 10*3/uL (ref 0.1–1.0)
Monocytes Relative: 8 % (ref 3–12)
Monocytes Relative: 9 % (ref 3–12)
Neutro Abs: 4.4 10*3/uL (ref 1.7–7.7)
Neutrophils Relative %: 66 % (ref 43–77)
Neutrophils Relative %: 61 % (ref 43–77)

## 2010-08-02 LAB — BASIC METABOLIC PANEL
BUN: 9 mg/dL (ref 6–23)
CO2: 22 mEq/L (ref 19–32)
Calcium: 9.3 mg/dL (ref 8.4–10.5)
Chloride: 107 mEq/L (ref 96–112)
Creatinine, Ser: 0.8 mg/dL (ref 0.4–1.2)
GFR calc Af Amer: >60 mL/min (ref >60)
GFR calc non Af Amer: >60 mL/min (ref >60)
Glucose, Bld: 98 mg/dL (ref 70–99)
Potassium: 4.5 mEq/L (ref 3.5–5.1)
Sodium: 138 mEq/L (ref 135–145)

## 2010-08-02 LAB — URINALYSIS, ROUTINE W REFLEX MICROSCOPIC
Bilirubin Urine: NEGATIVE
Glucose, UA: NEGATIVE mg/dL
Ketones, ur: NEGATIVE mg/dL
Nitrite: NEGATIVE
Protein, ur: NEGATIVE mg/dL
Specific Gravity, Urine: 1.021 (ref 1.005–1.030)
Urobilinogen, UA: 0.2 mg/dL (ref 0.0–1.0)
pH: 6 (ref 5.0–8.0)

## 2010-08-02 LAB — WET PREP, GENITAL
Trich, Wet Prep: NONE SEEN
Yeast Wet Prep HPF POC: NONE SEEN

## 2010-08-02 LAB — PREGNANCY, URINE: Preg Test, Ur: NEGATIVE

## 2010-08-02 LAB — GC/CHLAMYDIA PROBE AMP, GENITAL
Chlamydia, DNA Probe: NEGATIVE
GC Probe Amp, Genital: NEGATIVE

## 2010-08-03 ENCOUNTER — Inpatient Hospital Stay (HOSPITAL_COMMUNITY): Payer: Medicaid Other

## 2010-08-03 ENCOUNTER — Inpatient Hospital Stay (HOSPITAL_COMMUNITY)
Admission: AD | Admit: 2010-08-03 | Discharge: 2010-08-03 | Disposition: A | Discharge status: Home / Self Care | Payer: Medicaid Other | Source: Ambulatory Visit | Attending: Obstetrics | Admitting: Obstetrics

## 2010-08-03 DIAGNOSIS — N390 Urinary tract infection, site not specified: Secondary | ICD-10-CM

## 2010-08-03 DIAGNOSIS — R109 Unspecified abdominal pain: Secondary | ICD-10-CM | POA: Insufficient documentation

## 2010-08-03 LAB — POCT PREGNANCY, URINE
Preg Test, Ur: NEGATIVE
Preg Test, Ur: NEGATIVE

## 2010-08-03 LAB — CBC
HCT: 39.2 % (ref 36.0–46.0)
HCT: 38 % (ref 36.0–46.0)
Hemoglobin: 13.4 g/dL (ref 12.0–15.0)
MCH: 30.8 pg (ref 26.0–34.0)
MCV: 94 fL (ref 78.0–100.0)
MCV: 89.2 fL (ref 78.0–100.0)
Platelets: 293 10*3/uL (ref 150–400)
RBC: 4.17 MIL/uL (ref 3.87–5.11)
RBC: 4.26 MIL/uL (ref 3.87–5.11)
WBC: 5.7 10*3/uL (ref 4.0–10.5)
WBC: 7.6 10*3/uL (ref 4.0–10.5)

## 2010-08-03 LAB — URINALYSIS, ROUTINE W REFLEX MICROSCOPIC
Bilirubin Urine: NEGATIVE
Glucose, UA: NEGATIVE mg/dL
Ketones, ur: NEGATIVE mg/dL
Nitrite: POSITIVE — AB
Protein, ur: NEGATIVE mg/dL
Protein, ur: NEGATIVE mg/dL
Specific Gravity, Urine: 1.02 (ref 1.005–1.030)
Urobilinogen, UA: 0.2 mg/dL (ref 0.0–1.0)
pH: 6.5 (ref 5.0–8.0)

## 2010-08-03 LAB — URINE MICROSCOPIC-ADD ON

## 2010-08-03 LAB — POCT I-STAT, CHEM 8
BUN: 8 mg/dL (ref 6–23)
Calcium, Ion: 1.2 mmol/L (ref 1.12–1.32)
Chloride: 108 mEq/L (ref 96–112)
Creatinine, Ser: 0.9 mg/dL (ref 0.4–1.2)
Glucose, Bld: 78 mg/dL (ref 70–99)
TCO2: 24 mmol/L (ref 0–100)

## 2010-08-03 LAB — DIFFERENTIAL
Eosinophils Absolute: 0.2 10*3/uL (ref 0.0–0.7)
Eosinophils Relative: 4 % (ref 0–5)
Lymphocytes Relative: 32 % (ref 12–46)
Lymphs Abs: 1.8 10*3/uL (ref 0.7–4.0)
Monocytes Absolute: 0.6 10*3/uL (ref 0.1–1.0)
Monocytes Relative: 10 % (ref 3–12)

## 2010-08-03 LAB — HCG, QUANTITATIVE, PREGNANCY: hCG, Beta Chain, Quant, S: <2 m[IU]/mL (ref <5)

## 2010-08-04 LAB — WET PREP, GENITAL
Clue Cells Wet Prep HPF POC: NONE SEEN
Yeast Wet Prep HPF POC: NONE SEEN

## 2010-08-05 ENCOUNTER — Ambulatory Visit: Payer: Medicaid Other | Admitting: Physical Therapy

## 2010-08-05 LAB — POCT I-STAT, CHEM 8
Chloride: 105 mEq/L (ref 96–112)
Creatinine, Ser: 0.9 mg/dL (ref 0.4–1.2)
Hemoglobin: 15 g/dL (ref 12.0–15.0)
Potassium: 4 mEq/L (ref 3.5–5.1)
Sodium: 138 mEq/L (ref 135–145)

## 2010-08-05 LAB — URINE CULTURE

## 2010-08-05 LAB — WET PREP, GENITAL: Yeast Wet Prep HPF POC: NONE SEEN

## 2010-08-05 LAB — URINALYSIS, ROUTINE W REFLEX MICROSCOPIC
Bilirubin Urine: NEGATIVE
Ketones, ur: NEGATIVE mg/dL
Nitrite: NEGATIVE
Protein, ur: NEGATIVE mg/dL
Specific Gravity, Urine: 1.014 (ref 1.005–1.030)
Urobilinogen, UA: 0.2 mg/dL (ref 0.0–1.0)

## 2010-08-05 LAB — URINE MICROSCOPIC-ADD ON

## 2010-08-05 LAB — DIFFERENTIAL
Basophils Absolute: 0 10*3/uL (ref 0.0–0.1)
Basophils Relative: 0 % (ref 0–1)
Eosinophils Absolute: 0 10*3/uL (ref 0.0–0.7)
Eosinophils Relative: 1 % (ref 0–5)
Monocytes Absolute: 0.7 10*3/uL (ref 0.1–1.0)
Monocytes Relative: 8 % (ref 3–12)

## 2010-08-05 LAB — CBC
HCT: 40.3 % (ref 36.0–46.0)
MCHC: 34 g/dL (ref 30.0–36.0)
MCV: 94.9 fL (ref 78.0–100.0)
Platelets: 334 10*3/uL (ref 150–400)
RDW: 13.9 % (ref 11.5–15.5)
WBC: 9.1 10*3/uL (ref 4.0–10.5)

## 2010-08-05 LAB — PREGNANCY, URINE: Preg Test, Ur: NEGATIVE

## 2010-08-10 ENCOUNTER — Ambulatory Visit: Payer: Medicaid Other | Admitting: Physical Therapy

## 2010-09-04 ENCOUNTER — Inpatient Hospital Stay (HOSPITAL_COMMUNITY)
Admission: AD | Admit: 2010-09-04 | Discharge: 2010-09-04 | Disposition: A | Discharge status: Home / Self Care | Payer: Medicaid Other | Source: Ambulatory Visit | Attending: Obstetrics & Gynecology | Admitting: Obstetrics & Gynecology

## 2010-09-04 DIAGNOSIS — R109 Unspecified abdominal pain: Secondary | ICD-10-CM

## 2010-09-04 DIAGNOSIS — N390 Urinary tract infection, site not specified: Secondary | ICD-10-CM | POA: Insufficient documentation

## 2010-09-04 DIAGNOSIS — N949 Unspecified condition associated with female genital organs and menstrual cycle: Secondary | ICD-10-CM | POA: Insufficient documentation

## 2010-09-04 DIAGNOSIS — N938 Other specified abnormal uterine and vaginal bleeding: Secondary | ICD-10-CM | POA: Insufficient documentation

## 2010-09-04 LAB — URINE MICROSCOPIC-ADD ON

## 2010-09-04 LAB — URINALYSIS, ROUTINE W REFLEX MICROSCOPIC
Glucose, UA: NEGATIVE mg/dL
Protein, ur: NEGATIVE mg/dL
Specific Gravity, Urine: 1.02 (ref 1.005–1.030)
Urobilinogen, UA: 1 mg/dL (ref 0.0–1.0)

## 2010-09-04 LAB — CBC
HCT: 38.8 % (ref 36.0–46.0)
Hemoglobin: 13.6 g/dL (ref 12.0–15.0)
MCH: 31.1 pg (ref 26.0–34.0)
MCHC: 35.1 g/dL (ref 30.0–36.0)

## 2010-09-07 NOTE — Discharge Summary (Signed)
NAMEAUTUMNE, [name]               ACCOUNT NO.:  [id number]   MEDICAL RECORD NO.:  [id number]          PATIENT TYPE:  INP   LOCATION:  9304                          [facility]ACILITY:  [facility]   PHYSICIAN:  Norton Blizzard, MD    DATE OF BIRTH:  Feb 21, 1986   DATE OF ADMISSION:  07/25/2007  DATE OF DISCHARGE:  07/27/2007                               DISCHARGE SUMMARY   DISCHARGE DIAGNOSES:  1. Bilateral tubo-ovarian abscess.  2. Glaucoma.   DISCHARGE MEDICATIONS:  1. Levofloxacin 500 mg p.o. daily x10 days.  2. Metronidazole 100 mg p.o. t.i.d. x10 days.  3. Pilocarpine 2% ophthalmic solution 1 drop each eye b.i.d.  4. Percocet (5/325) 1-2 tabs p.o. q.6 h. p.r.n. pain, dispense #30.  5. Ibuprofen 400 mg p.o. q.6 h. p.r.n. pain.   CONSULTS:  None.   PROCEDURES:  The transvaginal ultrasound on July 25, 2007, showed  bilateral ovarian masses with hyperechoic component.  These were not  present on a prior ultrasound from August 2008.  Differential diagnosis  was felt to represent a dermoid cyst versus abscess versus endometrial  mass.   LABS:  On admission, the patient had a negative urine pregnancy test.  Urinalysis was significant for moderate blood and large leukocytes,  otherwise within normal limits with a specific gravity of 1.010.  Urine  microscopy showed 7-10 white blood cells and 0-2 red blood cells.  Wet  prep showed few clue cells and few white blood cells, no Trichomonas or  yeast.  GC and Chlamydia probe are positive for both GC and Chlamydia.  CBC on admission 13.1 white blood cells, 12.3 hemoglobin, 36.8  hematocrit, and 404 platelets.  Hepatis B surface antigen is negative.  HIV antibody nonreactive.  Hepatitis C antibody negative.  RPR  nonreactive.  ESR 49.  On July 26, 2007, CBC white blood cell count 4.8,  hemoglobin 11.4, hematocrit 34.3, and platelets 356.  Urine drug screen  on July 26, 2007, was positive for marijuana.   BRIEF HOSPITAL COURSE:  This is a 25 year old  female admitted with  bilateral tubo-ovarian abscesses.  1. Tubo-ovarian abscess.  The patient is treated during this admission      with IV Avelox and IV Flagyl.  She is discharged on 10 days of p.o.      levofloxacin and 10 days of Metronidazole.  She remained afebrile      throughout admission.  2. Gonorrhea and Chlamydia infections.  The patient is positive for      Gonorrhea and Chlamydia and is treated with ceftriaxone and      azithromycin during this admission.  3. Glaucoma.  The patient's home pilocarpine was continued.  4. Pain control.  The patient was treated with Percocet and ibuprofen      during this admission.   DISCHARGE CONDITION:  The patient is discharged to home in stable  medical condition.   DISCHARGE INSTRUCTIONS:  The patient is advised to increase activity  fully and has no restrictions on diet.   FOLLOWUP APPOINTMENTS:  The patient was provided with phone number for  [facility] and is asked  call for an appointment to establish care with  them, she states she has no regular doctor and it would provide one.      Romero Belling, MD      Norton Blizzard, MD  Electronically Signed    MO/MEDQ  D:  07/27/2007  T:  07/27/2007  Job:  [id number]
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
## 2010-09-07 NOTE — H&P (Signed)
[name], [name]               ACCOUNT NO.:  [id number]   MEDICAL RECORD NO.:  [id number]          PATIENT TYPE:  IPS   LOCATION:  0404                          [facility]ILITY:  [facility]   PHYSICIAN:  Anselm Jungling, MD  DATE OF BIRTH:  02/27/1986   DATE OF ADMISSION:  04/16/2007  DATE OF DISCHARGE:                       PSYCHIATRIC ADMISSION ASSESSMENT   A 25 year old single female voluntarily admitted April 16, 2007.   HISTORY OF PRESENT ILLNESS:  The patient presents with a history  depression, auditory hallucinations.  She states the last she heard any  hallucinations was 2 days ago.  They were derogatory in nature.  She has  been off her medications for some time.  She has not been sleeping well.  She denies any suicidal thoughts.  Denies any substance use.  She  reports having a miscarriage 5 days ago and is currently being treated  for Bartholin cyst.   PAST PSYCHIATRIC HISTORY:  This is her first admission to [facility].  In the past has been on Prozac, Neurontin and Seroquel.  She did not feel the Seroquel is beneficial.  She reports being very  sedated.  She does report being in counseling for years, but is not  currently receiving treatment.  Apparently her therapist has retired.   SOCIAL HISTORY:  25 year old female has a 32-year-old son.  The patient  lives with a best friend.  Patient has a 10th grade education.   FAMILY HISTORY:  Unknown.   SOCIAL HISTORY:  The patient smokes.  Denies any alcohol or drug use.   PRIMARY CARE Edrian Melucci:  Has been going to the women's clinic for  treatment.   MEDICAL PROBLEMS:  Medical problems are currently a Bartholin's cyst.  The patient has a Word catheter in place, will need follow-up in about 1-  2 weeks.   MEDICATIONS:  His prescription for Vicodin 5/500 1 every 6 hours.  No  other medications at this time.   DRUG ALLERGIES:  DOXYCYCLINE, FLEXERIL, AND LATEX.   PHYSICAL EXAMINATION:  This is a young female who  was fully assessed at  [facility].  She received Rocephin 250 mg IM, Toradol and Vicodin.  She currently has a Word catheter in place for the abscess.  Her  temperature is 97.6, 65 heart rate, 18 respirations, blood pressure is  107/65.   LABORATORY DATA:  Her WBC count is at 11.  Her CMET  shows a glucose of  102.  Pregnancy test is negative .  Her GC and chlamydia were negative.   MENTAL STATUS EXAM:  She is sleepy but easily arousable, cooperative  during the interview.  Speech is soft spoken but normal pace.  Mood is  neutral.  The patient's affect is, she is pleasant and cooperative,  somewhat sad.  Thought processes endorsing auditory hallucinations.  Prior to admission does not appear to be actively responding.  Her  cognitive function intact.  Her memory is good.  Judgment, insight is  fair.  AXIS I:  Depressive disorder NOS.  AXIS II:  Deferred.  AXIS III:  Is a Bartholin cyst.  AXIS  IV:  Medical problems, possible problems with occupation and  psychosocial problems.  AXIS V:  Current is 35 .   PLAN:  Contract for safety.  Stabilize her mood and thinking.  We will  continue with Vicodin for pain.  The patient is to follow up with  women's clinic in 1-2 weeks as indicated.  We will initiate Prozac.  Case manager will obtain her follow-up.  The patient is to attend  groups, increase coping skills.  Tentative length of stay is 2 to 3  days.      Landry Corporal, N.P.      Anselm Jungling, MD  Electronically Signed    JO/MEDQ  D:  04/17/2007  T:  04/17/2007  Job:  [id number]
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
## 2010-09-10 NOTE — Discharge Summary (Signed)
NAMECONSTANCIA, GEETING               ACCOUNT NO.:  [id number]   MEDICAL RECORD NO.:  [id number]          PATIENT TYPE:  IPS   LOCATION:  0404                          [facility]   PHYSICIAN:  Jasmine Pang, M.D. DATE OF BIRTH:  March 17, 1986   DATE OF ADMISSION:  04/16/2007  DATE OF DISCHARGE:  04/18/2007                               DISCHARGE SUMMARY   IDENTIFICATION:  A 25 year old single female who was admitted on a  voluntary basis on April 16, 2007.   HISTORY OF PRESENT ILLNESS:  The patient presents with a history of  depression and auditory hallucinations.  She states that the last she  heard any hallucinations was 2 days ago.  There were derogatory in  nature.  She has been off her medications for sometime.  She has not  been sleeping well.  She denies any suicidal thoughts.  She denies any  substance abuse.  She reports having a miscarriage 5 days ago and is  currently being treated for Bartholin cyst.  This is the first admission  to [facility].  In the past, she has been on Prozac,  Neurontin, and Seroquel.  She did not feel the Seroquel was beneficial.  She reports being very sedated.  She does report being in counseling for  years, but is not currently receiving treatment.  Apparently, her  therapist has retired.  The patient smokes, but denies any alcohol or  drug use.   She is on no medications at this time.   She is allergic to:  1. DOXYCYCLINE.  2. FLEXERIL.  3. LATEX.   PHYSICAL FINDINGS:  This is a young female who was fully assessed at the  [facility].  She received Rocephin 20 mg IM, Toradol, and Vicodin.  She currently has a Word catheter in place for her abscess (Bartholin  cyst).   LABORATORY DATA:  Her WBC count is 11.  CMET shows a glucose of 102.  Pregnancy test is negative.  GC and chlamydia were negative.  Basic  metabolic panel was grossly within normal limits except for the elevated  glucose of 102.  Hepatic profile  was within normal limits.  TSH was  1.414, which was within normal limits.  Urine drug screen was positive  for marijuana, cocaine, and opiates.   HOSPITAL COURSE:  Upon admission, the patient was continued on tramadol  50 mg p.o. q.6 h. p.r.n. and hydrocodone 5/500 p.o. q.6 h. p.r.n. for  pain from abscess.  She was also started on Zyprexa Zydis 5 mg q.6 h.  p.r.n. for the voices and hallucinations.  On April 17, 2007, she was  started on Prozac 20 mg daily and Zyprexa Zydis 5 mg p.o. q.h.s.  The  patient tolerated these medications well with no significant side  effects.  She was friendly and cooperative with me in individual  sessions.  She stated she had been off her medications and was very  tearful at the [facility].  They then referred her to [place] for  stabilization.  She denied suicidal or homicidal ideation.  She denied  any current  auditory or visual hallucinations.  She states she has  minimal contact with her family except occasional  conversation with her  grandmother and father.  Her mother died when she was 58 years old.  The  patient was able to participate appropriately in unit therapeutic groups  and activities.  On April 18, 2007, mental status had improved from  admission status.  The patient was less depressed and anxious.  Affect  wide range.  No suicidal or homicidal ideation.  No thoughts of self  injurious behavior.  No auditory or visual hallucinations.  No paranoia  or delusions.  Thoughts were logical and goal-directed.  Thought  content, no predominant theme.  Cognitive was grossly back to baseline.  The patient felt ready for discharge and it was felt she would be safe  to go home.   DISCHARGE DIAGNOSES:  Axis I:  Major depression, no mood disorder, not  otherwise specified.  Rule out marijuana abuse.  Axis II:  None.  Axis III:  Bartholin cyst.  Axis IV:  Moderate (medical problems, possible problems with occupation,  and psychosocial  problems.)  Axis V:  Global assessment of functioning was 50 upon discharge.  Global  assessment of functioning was 35 upon admission.  Global assessment of  functioning highest past year was 60-65.   DISCHARGE PLANS:  There were no specific activity level or dietary  restrictions.   POST HOSPITAL CARE PLANS:  The patient will go to [facility] to see  Eugenie Birks on May 08, 2007, at 12:30 p.m.   DISCHARGE MEDICATIONS:  1. Fluoxetine 20 mg daily.  2. Zyprexa 5 mg at bedtime.  3..  Ambien 5 mg at bedtime p.r.n. insomnia.   She will also follow up at the [facility] for treatment of  her Bartholin's cyst.      Jasmine Pang, M.D.  Electronically Signed     BHS/MEDQ  D:  05/08/2007  T:  05/08/2007  Job:  [id number]
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
## 2010-10-23 ENCOUNTER — Inpatient Hospital Stay (HOSPITAL_COMMUNITY)
Admission: AD | Admit: 2010-10-23 | Discharge: 2010-10-23 | Disposition: A | Discharge status: Other Acute Inpatient Hospital | Payer: Medicaid Other | Source: Ambulatory Visit | Attending: Obstetrics | Admitting: Obstetrics

## 2010-10-23 ENCOUNTER — Other Ambulatory Visit: Payer: Self-pay | Admitting: Emergency Medicine

## 2010-10-23 ENCOUNTER — Emergency Department (HOSPITAL_COMMUNITY)
Admission: EM | Admit: 2010-10-23 | Discharge: 2010-10-24 | Disposition: A | Discharge status: Other Acute Inpatient Hospital | Payer: Medicaid Other | Source: Home / Self Care | Attending: Emergency Medicine | Admitting: Emergency Medicine

## 2010-10-23 DIAGNOSIS — R45851 Suicidal ideations: Secondary | ICD-10-CM | POA: Insufficient documentation

## 2010-10-23 DIAGNOSIS — F259 Schizoaffective disorder, unspecified: Secondary | ICD-10-CM

## 2010-10-23 DIAGNOSIS — F1420 Cocaine dependence, uncomplicated: Secondary | ICD-10-CM

## 2010-10-23 DIAGNOSIS — N898 Other specified noninflammatory disorders of vagina: Secondary | ICD-10-CM | POA: Insufficient documentation

## 2010-10-23 LAB — CBC
HCT: 36.1 % (ref 36.0–46.0)
Hemoglobin: 12.4 g/dL (ref 12.0–15.0)
MCH: 30.7 pg (ref 26.0–34.0)
MCHC: 34.3 g/dL (ref 30.0–36.0)
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
## 2010-10-24 ENCOUNTER — Inpatient Hospital Stay (HOSPITAL_COMMUNITY)
Admission: RE | Admit: 2010-10-24 | Discharge: 2010-10-30 | DRG: 885 | Disposition: A | Discharge status: Other Acute Inpatient Hospital | Payer: Medicaid Other | Source: Ambulatory Visit | Attending: Psychiatry | Admitting: Psychiatry

## 2010-10-25 DIAGNOSIS — F259 Schizoaffective disorder, unspecified: Secondary | ICD-10-CM

## 2010-10-25 LAB — RAPID URINE DRUG SCREEN, HOSP PERFORMED
Amphetamines: NOT DETECTED
Barbiturates: NOT DETECTED
Tetrahydrocannabinol: POSITIVE — AB

## 2010-10-25 LAB — COMPREHENSIVE METABOLIC PANEL
AST: 15 U/L (ref 0–37)
Albumin: 3.7 g/dL (ref 3.5–5.2)
Chloride: 107 mEq/L (ref 96–112)
Creatinine, Ser: 0.78 mg/dL (ref 0.50–1.10)
Potassium: 3.2 mEq/L — ABNORMAL LOW (ref 3.5–5.1)
Total Bilirubin: 0.3 mg/dL (ref 0.3–1.2)

## 2010-10-25 LAB — DIFFERENTIAL
Basophils Relative: 0 % (ref 0–1)
Monocytes Absolute: 1.1 10*3/uL — ABNORMAL HIGH (ref 0.1–1.0)
Monocytes Relative: 12 % (ref 3–12)
Neutro Abs: 3.7 10*3/uL (ref 1.7–7.7)

## 2010-10-25 LAB — CBC
MCV: 88.1 fL (ref 78.0–100.0)
Platelets: 347 10*3/uL (ref 150–400)
RDW: 13.5 % (ref 11.5–15.5)
WBC: 9.1 10*3/uL (ref 4.0–10.5)

## 2010-10-25 LAB — ETHANOL: Alcohol, Ethyl (B): <11 mg/dL (ref 0–11)

## 2010-10-30 ENCOUNTER — Inpatient Hospital Stay (HOSPITAL_COMMUNITY): Admission: AD | Admit: 2010-10-30 | Payer: Self-pay | Source: Ambulatory Visit | Admitting: Family Medicine

## 2010-10-30 ENCOUNTER — Inpatient Hospital Stay (HOSPITAL_COMMUNITY)
Admission: RE | Admit: 2010-10-30 | Discharge: 2010-11-01 | Disposition: A | Discharge status: Home / Self Care | Payer: Medicaid Other | Source: Ambulatory Visit | Attending: Psychiatry | Admitting: Psychiatry

## 2010-10-30 ENCOUNTER — Inpatient Hospital Stay (HOSPITAL_COMMUNITY)
Admission: AD | Admit: 2010-10-30 | Discharge: 2010-10-30 | Disposition: A | Discharge status: Other Acute Inpatient Hospital | Payer: Medicaid Other | Source: Ambulatory Visit | Attending: Obstetrics & Gynecology | Admitting: Obstetrics & Gynecology

## 2010-10-30 ENCOUNTER — Inpatient Hospital Stay (HOSPITAL_COMMUNITY): Payer: Medicaid Other | Admitting: Family Medicine

## 2010-10-30 DIAGNOSIS — Z9119 Patient's noncompliance with other medical treatment and regimen: Secondary | ICD-10-CM

## 2010-10-30 DIAGNOSIS — N938 Other specified abnormal uterine and vaginal bleeding: Secondary | ICD-10-CM | POA: Insufficient documentation

## 2010-10-30 DIAGNOSIS — R109 Unspecified abdominal pain: Secondary | ICD-10-CM

## 2010-10-30 DIAGNOSIS — R45851 Suicidal ideations: Secondary | ICD-10-CM

## 2010-10-30 DIAGNOSIS — F1110 Opioid abuse, uncomplicated: Secondary | ICD-10-CM

## 2010-10-30 DIAGNOSIS — F1410 Cocaine abuse, uncomplicated: Secondary | ICD-10-CM

## 2010-10-30 DIAGNOSIS — F259 Schizoaffective disorder, unspecified: Principal | ICD-10-CM

## 2010-10-30 DIAGNOSIS — N949 Unspecified condition associated with female genital organs and menstrual cycle: Secondary | ICD-10-CM

## 2010-10-30 DIAGNOSIS — Z91199 Patient's noncompliance with other medical treatment and regimen due to unspecified reason: Secondary | ICD-10-CM

## 2010-10-30 DIAGNOSIS — H409 Unspecified glaucoma: Secondary | ICD-10-CM

## 2010-10-30 DIAGNOSIS — N809 Endometriosis, unspecified: Secondary | ICD-10-CM

## 2010-10-30 LAB — DIFFERENTIAL
Eosinophils Absolute: 0.1 10*3/uL (ref 0.0–0.7)
Eosinophils Relative: 2 % (ref 0–5)
Lymphs Abs: 1.9 10*3/uL (ref 0.7–4.0)
Monocytes Absolute: 1.1 10*3/uL — ABNORMAL HIGH (ref 0.1–1.0)
Monocytes Relative: 14 % — ABNORMAL HIGH (ref 3–12)

## 2010-10-30 LAB — CBC
HCT: 35.8 % — ABNORMAL LOW (ref 36.0–46.0)
Hemoglobin: 12.3 g/dL (ref 12.0–15.0)
MCH: 30.9 pg (ref 26.0–34.0)
MCV: 89.9 fL (ref 78.0–100.0)
Platelets: 353 10*3/uL (ref 150–400)
RBC: 3.98 MIL/uL (ref 3.87–5.11)

## 2010-10-30 LAB — POCT PREGNANCY, URINE: Preg Test, Ur: NEGATIVE

## 2010-10-30 LAB — URINALYSIS, ROUTINE W REFLEX MICROSCOPIC
Glucose, UA: NEGATIVE mg/dL
Leukocytes, UA: NEGATIVE
pH: 7.5 (ref 5.0–8.0)

## 2010-10-30 LAB — WET PREP, GENITAL
Trich, Wet Prep: NONE SEEN
Yeast Wet Prep HPF POC: NONE SEEN

## 2010-10-30 LAB — URINE MICROSCOPIC-ADD ON

## 2010-10-30 MED ORDER — ACETAMINOPHEN 500 MG PO TABS
1000.0000 mg | ORAL_TABLET | Freq: Once | ORAL | Status: AC
  Administered 2010-10-30: 1000 mg via ORAL
  Filled 2010-10-30: qty 2

## 2010-10-30 NOTE — ED Provider Notes (Signed)
History     Chief Complaint  Patient presents with  . Vaginal Bleeding   The history is provided by the patient.      No past medical history on file.  No past surgical history on file.  No family history on file.  History  Substance Use Topics  . Smoking status: Not on file  . Smokeless tobacco: Not on file  . Alcohol Use: Not on file    Allergies: Allergies not on file  No prescriptions prior to admission    Review of Systems  Gastrointestinal: Positive for abdominal pain. Negative for nausea and vomiting.  Genitourinary: Positive for dysuria and frequency.       Nocturia, vaginal bleeding X 3 day, pain.  Musculoskeletal: Positive for back pain.   Physical Exam   Blood pressure 93/51, pulse 80, temperature 97.6 F (36.4 C), temperature source Oral, resp. rate 18, height 5\' 4"  (1.626 m), weight 125 lb (56.7 kg).  Physical Exam  Constitutional: She appears well-developed and well-nourished.       uncomfortable  HENT:  Head: Normocephalic.  Neck: Normal range of motion.  Respiratory: Effort normal.  GI: Soft. There is tenderness in the right lower quadrant and left lower quadrant. There is no rebound and no guarding.       Moderate bleeding without clots.    Genitourinary: Uterus is tender. Cervix exhibits no motion tenderness and no discharge. Right adnexum displays tenderness. Right adnexum displays no mass. Left adnexum displays tenderness. Left adnexum displays no mass. There is bleeding around the vagina.  Neurological: She is alert.  Skin: Skin is warm and dry.    MAU Course  Procedures  MDM  Patient is a current patient of Dr. Marcia Brash.  I reported to Dr. Tamela Oddi, Whittney's hx, chief complaint and information regarding her admission to [facility] for detox per patient report, of Cocaine.  Discussed pain, physical findings.  Order given for UPT, UA, CBC and Acetaminophen 1000mg  po now for pain.    Matt Holmes, NP 10/30/10 1420  Matt Holmes, NP 10/30/10 1424

## 2010-10-30 NOTE — ED Notes (Signed)
Patient states the medication has eased her pain from a 10/10 to an 8/10.  She wants Dr. Tamela Oddi to give her 1 vicoden.  I called Dr. Holley Dexter to report.  Per Dr. Tamela Oddi, no further treatment needed.  If she needs pain management for her detox off cocaine, they will give her that at [facility].  Will plan to [facility] patient back to that facility.   Matt Holmes, NP 10/30/10 1529

## 2010-10-30 NOTE — Initial Assessments (Signed)
pT FEELING A LITTLE BETTER. pT TRANSFERRED [facility]. Report given to charge nurse. [facility] transported pt . Report given.

## 2010-10-30 NOTE — Progress Notes (Signed)
Pt has endo metriousis. Stopped bleeding 1.5 months ago and  Started bleeding 3 days ago. Soaking through pads 4/day. Pt c/o severe abd pain and cramping. [facility].

## 2010-11-01 LAB — GC/CHLAMYDIA PROBE AMP, GENITAL: Chlamydia, DNA Probe: NEGATIVE

## 2010-11-02 NOTE — Discharge Summary (Addendum)
[name], [name]               ACCOUNT NO.:  [id number]  MEDICAL RECORD NO.:  [id number]  LOCATION:  0407                          [facility]ILITY[facility]  [facility]  PHYSICIAN:  Eulogio Ditch, MD DATE OF BIRTH:  06-05-1985  DATE OF ADMISSION:  10/30/2010 DATE OF DISCHARGE:  11/01/2010                              DISCHARGE SUMMARY   HISTORY OF PRESENT ILLNESS:  A 25 year old African American female who was admitted from [facility] after she was feeling depressed. The patient also reported at the time of admission ongoing suicidal ideations.  The patient was noncompliant with her medications.  The patient also was abusing cocaine.  The patient was admitted from [facility] to the [facility].  The patient was started on Prozac 20 mg along with Risperdal 3 mg at bedtime and trazodone 150 mg at bedtime.  The patient remained compliant with her medication throughout the stay.  No agitation was reported by the staff.  The patient responded to the medication well without any side effects.  The patient was also put on clonidine detox protocol.  Later on the patient was feeling dizzy on Risperdal 3 mg at bedtime so Risperdal was decreased to 2 mg.  As the patient complained of agitation during the daytime she was given 0.5 mg Risperdal b.i.d. After that the patient denied any suicidal ideations or homicidal ideations.  Denied hearing any voices, remained compliant with the treatment.  The patient agreed to be admitted to inpatient rehab at [facility].  On Monday November 01, 2010 when I saw the patient, the patient was very logical and goal directed.  Denies any suicidal or homicidal ideations.  Denies hearing any voices, was not delusional, was pleasant on approach.  Her insight and judgment improved.  The patient is going to stay with the daughter and on Wednesday she will go to the [facility] for inpatient admission.  During the hospital stay on Saturday October 30, 2010 the patient  complained of abdominal pain and vaginal bleeding.  The patient was sent to the [facility].  The patient was not put on any medication by them. They told the patient that she has endometriosis and she should take Tylenol for the pain and then she can follow up with them in the outpatient setting.  At the time of discharge the patient was not having any abdominal pain, did not complain of any vaginal bleeding.  DIAGNOSIS AT THE TIME OF DISCHARGE:  AXIS I: Schizoaffective disorder and currently under remission, chronic cocaine abuse and opioids abuse. AXIS II: Deferred. AXIS III: History of endometriosis, glaucoma of the left eye, history of umbilical hernia repair. AXIS IV: No current stressors at this time.  The patient remained compliant with medications. AXIS V: 55.  FOLLOW UP:  The patient is going to follow up at [facility] on Wednesday for the inpatient admission.  DISCHARGE MEDICATIONS: 1. Prozac 20 mg p.o. daily. 2. Risperdal 2 mg at bedtime. 3. Risperdal 0.5 mg twice daily. 4. Trazodone 150 mg at bedtime.     Eulogio Ditch, MD     SA/MEDQ  D:  11/01/2010  T:  11/01/2010  Job:  [id number]  Electronically Signed by Eulogio Ditch  on 11/02/2010 11:20:42 AM

## 2010-11-09 ENCOUNTER — Emergency Department (HOSPITAL_COMMUNITY)
Admission: EM | Admit: 2010-11-09 | Discharge: 2010-11-11 | Disposition: A | Discharge status: Other Acute Inpatient Hospital | Payer: Medicaid Other | Source: Home / Self Care | Attending: Emergency Medicine | Admitting: Emergency Medicine

## 2010-11-09 DIAGNOSIS — F1410 Cocaine abuse, uncomplicated: Secondary | ICD-10-CM | POA: Insufficient documentation

## 2010-11-09 DIAGNOSIS — Z5900 Homelessness unspecified: Secondary | ICD-10-CM | POA: Insufficient documentation

## 2010-11-09 DIAGNOSIS — N390 Urinary tract infection, site not specified: Secondary | ICD-10-CM | POA: Insufficient documentation

## 2010-11-09 DIAGNOSIS — X58XXXA Exposure to other specified factors, initial encounter: Secondary | ICD-10-CM | POA: Insufficient documentation

## 2010-11-09 DIAGNOSIS — R45851 Suicidal ideations: Secondary | ICD-10-CM | POA: Insufficient documentation

## 2010-11-09 DIAGNOSIS — IMO0002 Reserved for concepts with insufficient information to code with codable children: Secondary | ICD-10-CM | POA: Insufficient documentation

## 2010-11-09 LAB — CBC
HCT: 35.2 % — ABNORMAL LOW (ref 36.0–46.0)
Hemoglobin: 12.8 g/dL (ref 12.0–15.0)
MCV: 86.7 fL (ref 78.0–100.0)
Platelets: 424 10*3/uL — ABNORMAL HIGH (ref 150–400)
RBC: 4.06 MIL/uL (ref 3.87–5.11)
WBC: 9.2 10*3/uL (ref 4.0–10.5)

## 2010-11-09 LAB — DIFFERENTIAL
Lymphocytes Relative: 39 % (ref 12–46)
Lymphs Abs: 3.6 10*3/uL (ref 0.7–4.0)
Neutro Abs: 3.9 10*3/uL (ref 1.7–7.7)
Neutrophils Relative %: 42 % — ABNORMAL LOW (ref 43–77)

## 2010-11-10 LAB — RAPID URINE DRUG SCREEN, HOSP PERFORMED
Amphetamines: NOT DETECTED
Tetrahydrocannabinol: POSITIVE — AB

## 2010-11-10 LAB — URINALYSIS, ROUTINE W REFLEX MICROSCOPIC
Specific Gravity, Urine: 1.02 (ref 1.005–1.030)
Urobilinogen, UA: 1 mg/dL (ref 0.0–1.0)
pH: 7.5 (ref 5.0–8.0)

## 2010-11-10 LAB — BASIC METABOLIC PANEL
BUN: 15 mg/dL (ref 6–23)
CO2: 23 mEq/L (ref 19–32)
Chloride: 101 mEq/L (ref 96–112)
GFR calc Af Amer: >60 mL/min (ref >60)
Glucose, Bld: 106 mg/dL — ABNORMAL HIGH (ref 70–99)
Potassium: 3 mEq/L — ABNORMAL LOW (ref 3.5–5.1)

## 2010-11-10 LAB — URINE MICROSCOPIC-ADD ON

## 2010-11-11 ENCOUNTER — Inpatient Hospital Stay (HOSPITAL_COMMUNITY)
Admission: AD | Admit: 2010-11-11 | Discharge: 2010-11-12 | DRG: 885 | Disposition: A | Discharge status: Home / Self Care | Payer: Medicaid Other | Source: Ambulatory Visit | Attending: Psychiatry | Admitting: Psychiatry

## 2010-11-11 DIAGNOSIS — IMO0002 Reserved for concepts with insufficient information to code with codable children: Secondary | ICD-10-CM

## 2010-11-11 DIAGNOSIS — F1110 Opioid abuse, uncomplicated: Secondary | ICD-10-CM

## 2010-11-11 DIAGNOSIS — H409 Unspecified glaucoma: Secondary | ICD-10-CM

## 2010-11-11 DIAGNOSIS — F259 Schizoaffective disorder, unspecified: Secondary | ICD-10-CM

## 2010-11-11 DIAGNOSIS — Z5900 Homelessness unspecified: Secondary | ICD-10-CM

## 2010-11-11 DIAGNOSIS — F4310 Post-traumatic stress disorder, unspecified: Secondary | ICD-10-CM

## 2010-11-11 DIAGNOSIS — F1410 Cocaine abuse, uncomplicated: Secondary | ICD-10-CM

## 2010-11-11 DIAGNOSIS — R45851 Suicidal ideations: Secondary | ICD-10-CM

## 2010-11-11 DIAGNOSIS — N809 Endometriosis, unspecified: Secondary | ICD-10-CM

## 2010-11-11 DIAGNOSIS — X58XXXA Exposure to other specified factors, initial encounter: Secondary | ICD-10-CM

## 2010-11-11 DIAGNOSIS — N390 Urinary tract infection, site not specified: Secondary | ICD-10-CM

## 2010-11-11 DIAGNOSIS — Z6379 Other stressful life events affecting family and household: Secondary | ICD-10-CM

## 2010-11-11 DIAGNOSIS — F322 Major depressive disorder, single episode, severe without psychotic features: Secondary | ICD-10-CM
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
## 2010-11-30 NOTE — Discharge Summary (Signed)
  NAMEAVYNN, KLASSEN               ACCOUNT NO.:  [id number]  MEDICAL RECORD NO.:  [id number]  LOCATION:  7846                          FACILITY:  [facility]  PHYSICIAN:  Orson Aloe, MD       DATE OF BIRTH:  05/30/1985  DATE OF ADMISSION:  11/11/2010 DATE OF DISCHARGE:  11/12/2010                              DISCHARGE SUMMARY   IDENTIFYING INFORMATION:  This is a 25 year old African American female. This is a voluntary admission.  HISTORY OF PRESENT ILLNESS:  This is a repeat psychiatric admission for [name] who returned to the emergency room 2 weeks after her previous discharge complaining of abnormal vaginal bleeding, foot pain and feeling that she is "at the end of her rope".  She has a history of endometriosis.  She is homeless, has been walking a lot since then and had relapsed on cocaine, benzodiazepine and marijuana abuse.  She was initially evaluated in the emergency room and her alcohol level was noted to be negative.  Her urine drug screen was positive for cocaine, benzodiazepines and tetrahydrocannabinoids.  UA noted to be positive for protein at 30 mg/dL, positive for nitrites and large amount of leukocyte esterase.  COURSE OF HOSPITALIZATION:  She was admitted to our Dual Diagnosis Unit and her routine medications were restarted.  Septra was added for presumptive UTI.  She was initially started on Librium detox protocol and placed on Risperdal 3 mg p.o. q.h.s., also Elavil 25 mg q.h.s. and trazodone 150 mg p.o. q.h.s.  She was ready for discharge with no suicidal thoughts by the 20th and in full contact with reality.  DISCHARGE DIAGNOSES:  Schizoaffective disorder, polysubstance abuse. Axis II: No diagnosis. Axis III: Pyuria, rule out urinary tract infection, questionable history of endometriosis. Axis IV: Significant issues with homelessness. Axis V: Current 55.  DISCHARGE MEDICATIONS: 1. Risperdal 3 mg q.h.s. 2. TMP/SMX 1 tablet b.i.d. 3. Klonopin 1 mg t.i.d. 4.  Fluoxetine 20 mg daily. 5. Risperdal 0.5 mg b.i.d. 6. Trazodone 150 mg p.o. q.h.s.  DISCHARGE PLAN:  Follow up at [facility] and to attend AA meetings.     Margaret A. Lorin Picket, N.P.   ______________________________ Orson Aloe, MD    MAS/MEDQ  D:  11/29/2010  T:  11/29/2010  Job:  [id number]  Electronically Signed by Kari Baars N.P. on 11/29/2010 02:40:56 PM Electronically Signed by Orson Aloe  on 11/30/2010 07:57:52 AM

## 2010-12-07 ENCOUNTER — Encounter: Payer: Self-pay | Admitting: *Deleted

## 2010-12-07 DIAGNOSIS — N938 Other specified abnormal uterine and vaginal bleeding: Secondary | ICD-10-CM

## 2010-12-07 DIAGNOSIS — R102 Pelvic and perineal pain: Secondary | ICD-10-CM

## 2010-12-07 DIAGNOSIS — Z8742 Personal history of other diseases of the female genital tract: Secondary | ICD-10-CM | POA: Insufficient documentation

## 2010-12-16 ENCOUNTER — Ambulatory Visit: Payer: Medicaid Other | Admitting: Obstetrics and Gynecology

## 2010-12-26 ENCOUNTER — Emergency Department (HOSPITAL_COMMUNITY): Payer: Self-pay

## 2010-12-26 ENCOUNTER — Emergency Department (HOSPITAL_COMMUNITY)
Admission: EM | Admit: 2010-12-26 | Discharge: 2010-12-26 | Disposition: A | Discharge status: Home / Self Care | Payer: Self-pay | Attending: Emergency Medicine | Admitting: Emergency Medicine

## 2010-12-26 DIAGNOSIS — F319 Bipolar disorder, unspecified: Secondary | ICD-10-CM | POA: Insufficient documentation

## 2010-12-26 DIAGNOSIS — R071 Chest pain on breathing: Secondary | ICD-10-CM | POA: Insufficient documentation

## 2010-12-26 DIAGNOSIS — R51 Headache: Secondary | ICD-10-CM | POA: Insufficient documentation

## 2010-12-26 DIAGNOSIS — F341 Dysthymic disorder: Secondary | ICD-10-CM | POA: Insufficient documentation

## 2010-12-26 DIAGNOSIS — R6884 Jaw pain: Secondary | ICD-10-CM | POA: Insufficient documentation

## 2010-12-26 DIAGNOSIS — R404 Transient alteration of awareness: Secondary | ICD-10-CM | POA: Insufficient documentation

## 2011-01-06 ENCOUNTER — Emergency Department (HOSPITAL_COMMUNITY)
Admission: EM | Admit: 2011-01-06 | Discharge: 2011-01-06 | Discharge status: Against Medical Advice | Payer: Self-pay | Attending: Emergency Medicine | Admitting: Emergency Medicine

## 2011-01-06 DIAGNOSIS — M545 Low back pain, unspecified: Secondary | ICD-10-CM | POA: Insufficient documentation

## 2011-01-06 DIAGNOSIS — R1032 Left lower quadrant pain: Secondary | ICD-10-CM | POA: Insufficient documentation

## 2011-01-06 DIAGNOSIS — N898 Other specified noninflammatory disorders of vagina: Secondary | ICD-10-CM | POA: Insufficient documentation

## 2011-01-17 LAB — POCT PREGNANCY, URINE
Operator id: 294501
Preg Test, Ur: NEGATIVE

## 2011-01-17 LAB — URINALYSIS, ROUTINE W REFLEX MICROSCOPIC
Bilirubin Urine: NEGATIVE
Ketones, ur: NEGATIVE
Nitrite: NEGATIVE
Urobilinogen, UA: 1

## 2011-01-18 LAB — RAPID URINE DRUG SCREEN, HOSP PERFORMED
Amphetamines: NOT DETECTED
Benzodiazepines: NOT DETECTED
Cocaine: NOT DETECTED
Tetrahydrocannabinol: POSITIVE — AB

## 2011-01-18 LAB — WET PREP, GENITAL: Trich, Wet Prep: NONE SEEN

## 2011-01-18 LAB — URINE MICROSCOPIC-ADD ON

## 2011-01-18 LAB — CBC
Hemoglobin: 11.4 — ABNORMAL LOW
MCHC: 33.3
MCHC: 33.3
RBC: 3.72 — ABNORMAL LOW
RBC: 4
WBC: 13.1 — ABNORMAL HIGH

## 2011-01-18 LAB — SEDIMENTATION RATE: Sed Rate: 49 — ABNORMAL HIGH

## 2011-01-18 LAB — DIFFERENTIAL
Basophils Relative: 0
Monocytes Absolute: 1.3 — ABNORMAL HIGH
Monocytes Relative: 10
Neutro Abs: 9 — ABNORMAL HIGH

## 2011-01-18 LAB — URINALYSIS, ROUTINE W REFLEX MICROSCOPIC
Glucose, UA: NEGATIVE
Specific Gravity, Urine: 1.01

## 2011-01-18 LAB — HEPATITIS B SURFACE ANTIGEN: Hepatitis B Surface Ag: NEGATIVE

## 2011-01-18 LAB — HEPATITIS C ANTIBODY: HCV Ab: NEGATIVE

## 2011-01-18 LAB — RPR: RPR Ser Ql: NONREACTIVE

## 2011-01-18 LAB — URINE CULTURE

## 2011-01-28 LAB — CBC
HCT: 36.6
Hemoglobin: 12.6
MCHC: 34.3
MCV: 93.7
RBC: 4.29 MIL/uL (ref 3.87–5.11)
RBC: 3.91
WBC: 7.1 10*3/uL (ref 4.0–10.5)

## 2011-01-28 LAB — DIFFERENTIAL
Lymphocytes Relative: 32 % (ref 12–46)
Lymphs Abs: 2.3 10*3/uL (ref 0.7–4.0)
Monocytes Relative: 8 % (ref 3–12)
Neutro Abs: 4.1 10*3/uL (ref 1.7–7.7)
Neutrophils Relative %: 58 % (ref 43–77)

## 2011-01-28 LAB — COMPREHENSIVE METABOLIC PANEL
ALT: 20
Albumin: 3.8
Alkaline Phosphatase: 50
Glucose, Bld: 102 — ABNORMAL HIGH
Potassium: 3.7
Sodium: 138
Total Protein: 7.1

## 2011-01-28 LAB — WET PREP, GENITAL: Trich, Wet Prep: NONE SEEN

## 2011-01-28 LAB — OPIATE, QUANTITATIVE, URINE
Hydrocodone: 1050 ng/mL
Hydromorphone GC/MS Conf: 240 ng/mL
Oxymorphone: NEGATIVE ng/mL

## 2011-01-28 LAB — URINALYSIS, ROUTINE W REFLEX MICROSCOPIC
Glucose, UA: NEGATIVE mg/dL
Glucose, UA: NEGATIVE
Ketones, ur: NEGATIVE
Nitrite: NEGATIVE
Nitrite: NEGATIVE
Protein, ur: NEGATIVE mg/dL
Protein, ur: NEGATIVE
pH: 6 (ref 5.0–8.0)

## 2011-01-28 LAB — CULTURE, ROUTINE-ABSCESS

## 2011-01-28 LAB — PREGNANCY, URINE
Preg Test, Ur: NEGATIVE
Preg Test, Ur: NEGATIVE

## 2011-01-28 LAB — URINE MICROSCOPIC-ADD ON

## 2011-01-28 LAB — DRUGS OF ABUSE SCREEN W/O ALC, ROUTINE URINE
Creatinine,U: 360.5
Marijuana Metabolite: POSITIVE — AB
Methadone: NEGATIVE
Opiate Screen, Urine: POSITIVE — AB
Propoxyphene: NEGATIVE

## 2011-01-28 LAB — BASIC METABOLIC PANEL
Calcium: 9.3 mg/dL (ref 8.4–10.5)
Chloride: 105 mEq/L (ref 96–112)
Creatinine, Ser: 0.85 mg/dL (ref 0.4–1.2)
GFR calc Af Amer: >60 mL/min (ref >60)
GFR calc non Af Amer: >60 mL/min (ref >60)

## 2011-01-28 LAB — GC/CHLAMYDIA PROBE AMP, GENITAL: Chlamydia, DNA Probe: NEGATIVE

## 2011-02-04 ENCOUNTER — Emergency Department (HOSPITAL_COMMUNITY)
Admission: EM | Admit: 2011-02-04 | Discharge: 2011-02-04 | Discharge status: Against Medical Advice | Payer: Self-pay | Attending: Emergency Medicine | Admitting: Emergency Medicine

## 2011-02-04 DIAGNOSIS — H9209 Otalgia, unspecified ear: Secondary | ICD-10-CM | POA: Insufficient documentation

## 2011-02-04 DIAGNOSIS — M7989 Other specified soft tissue disorders: Secondary | ICD-10-CM | POA: Insufficient documentation

## 2011-02-04 DIAGNOSIS — R51 Headache: Secondary | ICD-10-CM | POA: Insufficient documentation

## 2011-02-04 DIAGNOSIS — M545 Low back pain, unspecified: Secondary | ICD-10-CM | POA: Insufficient documentation

## 2011-02-04 LAB — URINALYSIS, ROUTINE W REFLEX MICROSCOPIC
Leukocytes, UA: NEGATIVE
Nitrite: NEGATIVE
Specific Gravity, Urine: 1.016
Specific Gravity, Urine: 1.015
Urobilinogen, UA: 1
Urobilinogen, UA: 0.2
pH: 6

## 2011-02-04 LAB — BASIC METABOLIC PANEL
BUN: 7
Calcium: 9
Creatinine, Ser: 1.23 — ABNORMAL HIGH
GFR calc non Af Amer: 56 — ABNORMAL LOW
Glucose, Bld: 99
Sodium: 133 — ABNORMAL LOW

## 2011-02-04 LAB — URINE MICROSCOPIC-ADD ON

## 2011-02-04 LAB — GC/CHLAMYDIA PROBE AMP, GENITAL: GC Probe Amp, Genital: NEGATIVE

## 2011-02-04 LAB — CBC
Hemoglobin: 12.8
MCHC: 33.4
Platelets: 392
RDW: 15.7 — ABNORMAL HIGH

## 2011-02-04 LAB — DIFFERENTIAL
Basophils Absolute: 0
Basophils Relative: 0
Lymphocytes Relative: 35
Neutro Abs: 4.4
Neutrophils Relative %: 55

## 2011-02-04 LAB — WET PREP, GENITAL
Clue Cells Wet Prep HPF POC: NONE SEEN
WBC, Wet Prep HPF POC: NONE SEEN

## 2011-02-05 ENCOUNTER — Emergency Department (HOSPITAL_COMMUNITY)
Admission: EM | Admit: 2011-02-05 | Discharge: 2011-02-05 | Disposition: A | Discharge status: Home / Self Care | Payer: Self-pay | Attending: Emergency Medicine | Admitting: Emergency Medicine

## 2011-02-05 ENCOUNTER — Emergency Department (HOSPITAL_COMMUNITY): Payer: Self-pay

## 2011-02-05 DIAGNOSIS — Y92009 Unspecified place in unspecified non-institutional (private) residence as the place of occurrence of the external cause: Secondary | ICD-10-CM | POA: Insufficient documentation

## 2011-02-05 DIAGNOSIS — M545 Low back pain, unspecified: Secondary | ICD-10-CM | POA: Insufficient documentation

## 2011-02-05 DIAGNOSIS — H9209 Otalgia, unspecified ear: Secondary | ICD-10-CM | POA: Insufficient documentation

## 2011-02-05 DIAGNOSIS — M7989 Other specified soft tissue disorders: Secondary | ICD-10-CM | POA: Insufficient documentation

## 2011-02-05 DIAGNOSIS — R300 Dysuria: Secondary | ICD-10-CM | POA: Insufficient documentation

## 2011-02-05 LAB — PREGNANCY, URINE: Preg Test, Ur: NEGATIVE

## 2011-02-05 LAB — URINALYSIS, ROUTINE W REFLEX MICROSCOPIC
Bilirubin Urine: NEGATIVE
Glucose, UA: NEGATIVE mg/dL
Ketones, ur: NEGATIVE mg/dL
Protein, ur: NEGATIVE mg/dL
pH: 8 (ref 5.0–8.0)

## 2011-02-05 LAB — URINE MICROSCOPIC-ADD ON

## 2011-02-07 LAB — POCT PREGNANCY, URINE
Operator id: 29026
Preg Test, Ur: NEGATIVE

## 2011-02-07 LAB — URINALYSIS, ROUTINE W REFLEX MICROSCOPIC
Glucose, UA: NEGATIVE
Specific Gravity, Urine: 1.019

## 2011-02-07 LAB — URINE MICROSCOPIC-ADD ON

## 2011-02-09 ENCOUNTER — Inpatient Hospital Stay (HOSPITAL_COMMUNITY)
Admission: AD | Admit: 2011-02-09 | Discharge: 2011-02-09 | Disposition: A | Discharge status: Home / Self Care | Payer: Self-pay | Source: Ambulatory Visit | Attending: Obstetrics & Gynecology | Admitting: Obstetrics & Gynecology

## 2011-02-09 ENCOUNTER — Encounter (HOSPITAL_COMMUNITY): Payer: Self-pay | Admitting: *Deleted

## 2011-02-09 ENCOUNTER — Inpatient Hospital Stay (HOSPITAL_COMMUNITY): Payer: Self-pay

## 2011-02-09 DIAGNOSIS — N730 Acute parametritis and pelvic cellulitis: Secondary | ICD-10-CM

## 2011-02-09 DIAGNOSIS — N949 Unspecified condition associated with female genital organs and menstrual cycle: Secondary | ICD-10-CM | POA: Insufficient documentation

## 2011-02-09 LAB — URINE MICROSCOPIC-ADD ON

## 2011-02-09 LAB — URINALYSIS, ROUTINE W REFLEX MICROSCOPIC
Bilirubin Urine: NEGATIVE
Ketones, ur: NEGATIVE mg/dL
Nitrite: NEGATIVE
Urobilinogen, UA: 0.2 mg/dL (ref 0.0–1.0)
pH: 6 (ref 5.0–8.0)

## 2011-02-09 LAB — WET PREP, GENITAL
Clue Cells Wet Prep HPF POC: NONE SEEN
Trich, Wet Prep: NONE SEEN
Yeast Wet Prep HPF POC: NONE SEEN

## 2011-02-09 LAB — RAPID URINE DRUG SCREEN, HOSP PERFORMED
Barbiturates: NOT DETECTED
Benzodiazepines: NOT DETECTED

## 2011-02-09 LAB — POCT PREGNANCY, URINE: Preg Test, Ur: NEGATIVE

## 2011-02-09 MED ORDER — AZITHROMYCIN 500 MG PO TABS: ORAL_TABLET | ORAL | Status: DC

## 2011-02-09 MED ORDER — CEFTRIAXONE SODIUM 1 G IJ SOLR
1.0000 g | Freq: Once | INTRAMUSCULAR | Status: AC
  Administered 2011-02-09: 1 g via INTRAMUSCULAR

## 2011-02-09 MED ORDER — OXYCODONE-ACETAMINOPHEN 5-325 MG PO TABS: 1.0000 | ORAL_TABLET | ORAL | Status: AC | PRN

## 2011-02-09 MED ORDER — OXYCODONE-ACETAMINOPHEN 5-325 MG PO TABS
2.0000 | ORAL_TABLET | Freq: Once | ORAL | Status: AC
  Administered 2011-02-09: 2 via ORAL
  Filled 2011-02-09: qty 2

## 2011-02-09 MED ORDER — CEFTRIAXONE SODIUM 1 G IJ SOLR
500.0000 mg | Freq: Once | INTRAMUSCULAR | Status: DC
  Filled 2011-02-09: qty 1

## 2011-02-09 NOTE — Progress Notes (Signed)
Pt left immediately after social worker walked out of room, states she has to catch the bus and can not wait for paper work that Child psychotherapist was bringing her.  States she will return if she misses the bus.  Pt given discharge instructions, states she can not wait to have vital signs taken or to sign for discharge instructions.

## 2011-02-09 NOTE — ED Provider Notes (Signed)
History   Pt presents today c/o lower pelvic pain, vag dc with irritation, and Rt lower back pain that radiates down her Rt leg. She denies fever and states her vag bleeding stopped yesterday. She has a long hx of chronic pelvic pain.  Chief Complaint  Patient presents with  . Abdominal Pain   HPI  OB History    Grav Para Term Preterm Abortions TAB SAB Ect Mult Living                  Past Medical History  Diagnosis Date  . STD (female)     hx of chlamydia and gonorrhea  . History of PID   . Mental disorder     Past Surgical History  Procedure Date  . Fracture surgery     left leg    No family history on file.  History  Substance Use Topics  . Smoking status: Not on file  . Smokeless tobacco: Not on file  . Alcohol Use:     Allergies:  Allergies  Allergen Reactions  . Darvocet (Propoxyphene N-Acetaminophen) Hives  . Doxycycline Swelling  . Metoclopramide Hives  . Naproxen Hives  . Latex Rash  . Tramadol Rash    Prescriptions prior to admission  Medication Sig Dispense Refill  . citalopram (CELEXA) 40 MG tablet Take 40 mg by mouth daily.        . clonazePAM (KLONOPIN) 1 MG tablet Take 1 mg by mouth 3 (three) times daily as needed. anxiety       . DULoxetine (CYMBALTA) 30 MG capsule Take 30 mg by mouth daily.        Marland Kitchen HYDROcodone-acetaminophen (NORCO) 10-325 MG per tablet Take 1 tablet by mouth every 6 (six) hours as needed. pain       . lithium carbonate 300 MG capsule Take 300 mg by mouth 2 (two) times daily with a meal.        . zolpidem (AMBIEN) 10 MG tablet Take 20 mg by mouth at bedtime as needed. sleep         Review of Systems  Constitutional: Negative for fever.  Cardiovascular: Negative for chest pain.  Gastrointestinal: Positive for abdominal pain. Negative for nausea, vomiting, diarrhea, constipation and blood in stool.  Genitourinary: Positive for dysuria, urgency and frequency. Negative for hematuria and flank pain.  Neurological: Negative  for dizziness and headaches.  Psychiatric/Behavioral: Negative for depression and suicidal ideas.   Physical Exam   Blood pressure 117/73, pulse 75, temperature 98.5 F (36.9 C), temperature source Oral, resp. rate 16, height 5\' 3"  (1.6 m), weight 139 lb 12.8 oz (63.413 kg), SpO2 97.00%.  Physical Exam  Nursing note and vitals reviewed. Constitutional: She is oriented to person, place, and time. She appears well-developed and well-nourished. No distress.  HENT:  Head: Normocephalic and atraumatic.  Eyes: EOM are normal. Pupils are equal, round, and reactive to light.  GI: Soft. She exhibits no distension and no mass. There is tenderness (Tenderness noted over bladder.). There is no rebound and no guarding.  Genitourinary: Uterus normal. Cervix exhibits no motion tenderness and no friability. Right adnexum displays no mass, no tenderness and no fullness. Left adnexum displays fullness. Left adnexum displays no mass and no tenderness. There is tenderness around the vagina. No erythema or bleeding around the vagina. No signs of injury around the vagina. Vaginal discharge found.  Neurological: She is alert and oriented to person, place, and time.  Skin: Skin is warm and dry. She is  not diaphoretic.  Psychiatric: She has a normal mood and affect. Her behavior is normal. Judgment and thought content normal.    MAU Course  Procedures  Wet prep and GC/Chlamydia cultures done.  Social Services also to see pt.  Results for orders placed during the hospital encounter of 02/09/11 (from the past 24 hour(s))  URINALYSIS, ROUTINE W REFLEX MICROSCOPIC     Status: Abnormal   Collection Time   02/09/11 12:45 PM      Component Value Range   Color, Urine YELLOW  YELLOW    Appearance CLEAR  CLEAR    Specific Gravity, Urine 1.020  1.005 - 1.030    pH 6.0  5.0 - 8.0    Glucose, UA NEGATIVE  NEGATIVE (mg/dL)   Hgb urine dipstick SMALL (*) NEGATIVE    Bilirubin Urine NEGATIVE  NEGATIVE    Ketones, ur  NEGATIVE  NEGATIVE (mg/dL)   Protein, ur NEGATIVE  NEGATIVE (mg/dL)   Urobilinogen, UA 0.2  0.0 - 1.0 (mg/dL)   Nitrite NEGATIVE  NEGATIVE    Leukocytes, UA NEGATIVE  NEGATIVE   URINE RAPID DRUG SCREEN (HOSP PERFORMED)     Status: Normal (Preliminary result)   Collection Time   02/09/11 12:45 PM      Component Value Range   Opiates NONE DETECTED  NONE DETECTED    Cocaine PENDING  NONE DETECTED    Benzodiazepines NONE DETECTED  NONE DETECTED    Amphetamines NONE DETECTED  NONE DETECTED    Tetrahydrocannabinol NONE DETECTED  NONE DETECTED    Barbiturates NONE DETECTED  NONE DETECTED   URINE MICROSCOPIC-ADD ON     Status: Normal   Collection Time   02/09/11 12:45 PM      Component Value Range   Squamous Epithelial / LPF RARE  RARE    RBC / HPF 0-2  <3 (RBC/hpf)  WET PREP, GENITAL     Status: Abnormal   Collection Time   02/09/11 12:50 PM      Component Value Range   Yeast, Wet Prep NONE SEEN  NONE SEEN    Trich, Wet Prep NONE SEEN  NONE SEEN    Clue Cells, Wet Prep NONE SEEN  NONE SEEN    WBC, Wet Prep HPF POC MANY (*) NONE SEEN   POCT PREGNANCY, URINE     Status: Normal   Collection Time   02/09/11 12:50 PM      Component Value Range   Preg Test, Ur NEGATIVE      Assessment and Plan  Pelvic pain: possible PID. Discussed with pt at length. Will give Rx for Azithromycin and 10 percocet. She will f/u with her PCP. Discussed diet, activity, risks, and precautions.  Clinton Gallant. Graves Nipp III, DrHSc, MPAS, PA-C  02/09/2011, 12:44 PM   Henrietta Hoover, PA 02/09/11 1430

## 2011-02-09 NOTE — Progress Notes (Signed)
Pt states she has a history of endometreosis. Has had a lot of abdominal and pelvic pain. Has had vagina pain and burning. Urinary frequency with abdominal pain. Has had bleeding up until yesterday, none now.

## 2011-02-09 NOTE — Plan of Care (Signed)
Pt is on the phone in the lobby when called to triage. Will not end the call.

## 2011-02-09 NOTE — Progress Notes (Signed)
Pt in c/o hx of endometriosis.  Pt in c/o of lower back pain and pelvic pain x1 week.  Denies any abnormal discharge.  Reports bleeding x2 days, none now.  Reports pain and burning with urination.

## 2011-02-10 LAB — GC/CHLAMYDIA PROBE AMP, GENITAL
Chlamydia, DNA Probe: NEGATIVE
GC Probe Amp, Genital: NEGATIVE

## 2011-02-10 NOTE — ED Provider Notes (Signed)
Attestation of Attending Supervision of Advanced Practitioner: Evaluation and management procedures were performed by the PA/NP/CNM/OB Fellow under my supervision/collaboration. Chart reviewed and agree with management and plan.  Billiejean Schimek A 02/10/2011 2:46 PM

## 2011-02-10 NOTE — Progress Notes (Signed)
LATE ENTRY FROM 02/09/11   PSYCHOSOCIAL ASSESSMENT ~ MATERNAL/CHILD Name:   Cendy Oconnor                                                                                   Age: 25   Referral Date:       10 /18   / 12  Reason/Source: Substance use & Homelessness / CN  I. FAMILY/HOME ENVIRONMENT A. Child's Legal Guardian _X__Parent(s) ___Grandparent ___Foster parent ___DSS_________________ Name:                                                                DOB: //                     Age:   Address:  Name:                                                               DOB: //                     Age:   Address:  B. Other Household Members/Support Persons Name:                                         Relationship:                        DOB ___/___/___                   Name:                                         Relationship:                        DOB ___/___/___                   Name:                                         Relationship:                        DOB ___/___/___                   Name:  Relationship:                        DOB ___/___/___  C. Other Support:   II. PSYCHOSOCIAL DATA A. Information Source                                                                                             _X_Patient Interview  __Family Interview           __Other___________  B. Event organiser __Employment: __Medicaid    [place]:                 __Private Insurance:                   __Self Pay  __Food Stamps   __WIC __[employer]     __Public Housing     __Section 8    __Maternity Care Coordination/Child Service Coordination/Early Intervention   ___School:                                                                         Grade:  __Other:   Salena Saner Cultural and Environment Information Cultural Issues Impacting Care:  III. STRENGTHS ___Supportive family/friends ___Adequate Resources ___Compliance with medical  plan ___Home prepared for Child (including basic supplies) ___Understanding of illness      ___Other: RISK FACTORS AND CURRENT PROBLEMS         ____No Problems Noted           Crack abuse  Homelessness  IV. SOCIAL WORK ASSESSMENT  Sw met with the pt to offer treatment and housing options.  Pt told Sw that she started smoking crack 5 months ago after a 2 year relationship ended with her boyfriend.    Pt has a 47 year old daughter who lives with her godmother.  Pt received brief treatment at [facility] and [facility].  She became homeless 5 months ago and states she walks the streets or provides sexual favors to be granted shelter for the the night.  Pt expressed interest in going to [facility] for detox and long term treatment.  She denies any SI but appears to be depressed and hopeless.  Her mother died from a drug overdose 20 years ago.  He reports that no one in her family cares about her.  Pt is not interested in shelter information.  Sw went to get treatment options for the pt but she left.  If pt returns to [facility] Sw has resources to provide to her.   V. SOCIAL WORK PLAN  _X__No Further Intervention Required/No Barriers to Discharge   ___Psychosocial Support and Ongoing Assessment of Needs   ___Patient/Family Education:   ___Child Protective Services Report   County___________ Date___/____/____   ___Information/Referral to [employer]_________________________   ___Other:

## 2011-03-12 ENCOUNTER — Inpatient Hospital Stay (HOSPITAL_COMMUNITY)
Admission: AD | Admit: 2011-03-12 | Discharge: 2011-03-12 | Disposition: A | Discharge status: Home / Self Care | Payer: Self-pay | Source: Ambulatory Visit | Attending: Obstetrics & Gynecology | Admitting: Obstetrics & Gynecology

## 2011-03-12 ENCOUNTER — Encounter (HOSPITAL_COMMUNITY): Payer: Self-pay | Admitting: *Deleted

## 2011-03-12 ENCOUNTER — Inpatient Hospital Stay (HOSPITAL_COMMUNITY): Payer: Self-pay

## 2011-03-12 DIAGNOSIS — R102 Pelvic and perineal pain: Secondary | ICD-10-CM

## 2011-03-12 DIAGNOSIS — N809 Endometriosis, unspecified: Secondary | ICD-10-CM

## 2011-03-12 DIAGNOSIS — N949 Unspecified condition associated with female genital organs and menstrual cycle: Secondary | ICD-10-CM | POA: Insufficient documentation

## 2011-03-12 HISTORY — DX: Unspecified ovarian cyst, unspecified side: N83.209

## 2011-03-12 LAB — DIFFERENTIAL
Basophils Absolute: 0 10*3/uL (ref 0.0–0.1)
Eosinophils Relative: 6 % — ABNORMAL HIGH (ref 0–5)
Lymphocytes Relative: 31 % (ref 12–46)
Lymphs Abs: 2 10*3/uL (ref 0.7–4.0)
Neutro Abs: 3.1 10*3/uL (ref 1.7–7.7)
Neutrophils Relative %: 50 % (ref 43–77)

## 2011-03-12 LAB — URINALYSIS, ROUTINE W REFLEX MICROSCOPIC
Nitrite: NEGATIVE
Protein, ur: NEGATIVE mg/dL
Specific Gravity, Urine: 1.02 (ref 1.005–1.030)
Urobilinogen, UA: 0.2 mg/dL (ref 0.0–1.0)

## 2011-03-12 LAB — CBC
MCV: 89.8 fL (ref 78.0–100.0)
Platelets: 369 10*3/uL (ref 150–400)
RBC: 3.94 MIL/uL (ref 3.87–5.11)
RDW: 14 % (ref 11.5–15.5)
WBC: 6.2 10*3/uL (ref 4.0–10.5)

## 2011-03-12 LAB — WET PREP, GENITAL: Clue Cells Wet Prep HPF POC: NONE SEEN

## 2011-03-12 LAB — URINE MICROSCOPIC-ADD ON

## 2011-03-12 LAB — POCT PREGNANCY, URINE: Preg Test, Ur: NEGATIVE

## 2011-03-12 MED ORDER — OXYCODONE-ACETAMINOPHEN 5-325 MG PO TABS
1.0000 | ORAL_TABLET | Freq: Once | ORAL | Status: AC
  Administered 2011-03-12: 1 via ORAL
  Filled 2011-03-12: qty 1

## 2011-03-12 MED ORDER — OXYCODONE-ACETAMINOPHEN 5-325 MG PO TABS: 2.0000 | ORAL_TABLET | Freq: Four times a day (QID) | ORAL | Status: AC | PRN

## 2011-03-12 NOTE — Progress Notes (Signed)
Pt medicated with percocet 1tab. Pt upset stated she needeed 2. Will check with NP to increase dose. Pt also did not want to go to  [facility] because it wil lnot show anything. Will notify. NP

## 2011-03-12 NOTE — Progress Notes (Signed)
Pt reports having endometriosis for the past 2 years. Has not had regular periods. Reports "bleeding is very heavy and having lots of back pain.

## 2011-03-12 NOTE — ED Provider Notes (Signed)
History     Chief Complaint  Patient presents with  . Vaginal Bleeding   HPI [name] is 25 y.o. [id number]  Had bleeding 1 month ago.  States she has dx by [facility] ( Dr. Gladis Riffle) 2011.  Tried hormones for control of pain but had more bleeding.  No meds X several months.  States she is tired of hurting and bleeding.  Using condoms.  New partner X 1.  Denies abnormal vaginal and odor.      Past Medical History  Diagnosis Date  . STD (female)     hx of chlamydia and gonorrhea  . History of PID   . Endometriosis   . Glaucoma   . Ovarian cyst     Past Surgical History  Procedure Date  . Fracture surgery     left leg  . Dilation and curettage of uterus   . Laparoscopy     No family history on file.  History  Substance Use Topics  . Smoking status: Current Everyday Smoker -- 0.5 packs/day    Types: Cigarettes  . Smokeless tobacco: Not on file  . Alcohol Use: No    Allergies:  Allergies  Allergen Reactions  . Darvocet (Propoxyphene N-Acetaminophen) Hives  . Doxycycline Swelling  . Metoclopramide Hives  . Naproxen Hives  . Latex Rash  . Tramadol Rash    Prescriptions prior to admission  Medication Sig Dispense Refill  . azithromycin (ZITHROMAX) 500 MG tablet Take both tablets as a single dose.  2 tablet  0    Review of Systems  Constitutional: Negative for fever and chills.  Respiratory: Negative.   Cardiovascular: Negative.   Gastrointestinal: Positive for abdominal pain.  Genitourinary: Negative.        Denies bleeding at this time.  Vaginal discharge and odor   Physical Exam   Blood pressure 119/80, pulse 72, temperature 98.6 F (37 C), temperature source Oral, resp. rate 18, height 5\' 4"  (1.626 m), weight 143 lb 3.2 oz (64.955 kg).  Physical Exam  Nursing note and vitals reviewed. Constitutional: She is oriented to person, place, and time. She appears well-developed and well-nourished. Distressed: uncomfortable.  HENT:  Head: Normocephalic.    Neck: Normal range of motion.  Cardiovascular: Normal rate.   Respiratory: Effort normal.  GI: Soft. She exhibits no mass. There is tenderness. There is no rebound and no guarding.  Genitourinary: Uterus is tender. Uterus is not enlarged. Cervix exhibits no motion tenderness, no discharge and no friability. Right adnexum displays tenderness. Right adnexum displays no mass and no fullness. Left adnexum displays tenderness. Left adnexum displays no mass and no fullness. No bleeding around the vagina. There is a foreign body (retained tampon) around the vagina. Vaginal discharge (thin foul smelling discharge) found.  Neurological: She is alert and oriented to person, place, and time.  Skin: Skin is warm and dry.    Results for orders placed during the hospital encounter of 03/12/11 (from the past 24 hour(s))  URINALYSIS, ROUTINE W REFLEX MICROSCOPIC     Status: Abnormal   Collection Time   03/12/11  1:10 PM      Component Value Range   Color, Urine YELLOW  YELLOW    Appearance CLEAR  CLEAR    Specific Gravity, Urine 1.020  1.005 - 1.030    pH 6.5  5.0 - 8.0    Glucose, UA NEGATIVE  NEGATIVE (mg/dL)   Hgb urine dipstick LARGE (*) NEGATIVE    Bilirubin Urine NEGATIVE  NEGATIVE  Ketones, ur NEGATIVE  NEGATIVE (mg/dL)   Protein, ur NEGATIVE  NEGATIVE (mg/dL)   Urobilinogen, UA 0.2  0.0 - 1.0 (mg/dL)   Nitrite NEGATIVE  NEGATIVE    Leukocytes, UA MODERATE (*) NEGATIVE   URINE MICROSCOPIC-ADD ON     Status: Abnormal   Collection Time   03/12/11  1:10 PM      Component Value Range   Squamous Epithelial / LPF FEW (*) RARE    WBC, UA 3-6  <3 (WBC/hpf)   RBC / HPF 3-6  <3 (RBC/hpf)   Bacteria, UA RARE  RARE   POCT PREGNANCY, URINE     Status: Normal   Collection Time   03/12/11  1:24 PM      Component Value Range   Preg Test, Ur NEGATIVE    WET PREP, GENITAL     Status: Abnormal   Collection Time   03/12/11  2:00 PM      Component Value Range   Yeast, Wet Prep NONE SEEN  NONE SEEN     Trich, Wet Prep NONE SEEN  NONE SEEN    Clue Cells, Wet Prep NONE SEEN  NONE SEEN    WBC, Wet Prep HPF POC FEW (*) NONE SEEN   CBC     Status: Abnormal   Collection Time   03/12/11  2:10 PM      Component Value Range   WBC 6.2  4.0 - 10.5 (K/uL)   RBC 3.94  3.87 - 5.11 (MIL/uL)   Hemoglobin 11.9 (*) 12.0 - 15.0 (g/dL)   HCT 40.9 (*) 81.1 - 46.0 (%)   MCV 89.8  78.0 - 100.0 (fL)   MCH 30.2  26.0 - 34.0 (pg)   MCHC 33.6  30.0 - 36.0 (g/dL)   RDW 91.4  78.2 - 95.6 (%)   Platelets 369  150 - 400 (K/uL)  DIFFERENTIAL     Status: Abnormal   Collection Time   03/12/11  2:10 PM      Component Value Range   Neutrophils Relative 50  43 - 77 (%)   Neutro Abs 3.1  1.7 - 7.7 (K/uL)   Lymphocytes Relative 31  12 - 46 (%)   Lymphs Abs 2.0  0.7 - 4.0 (K/uL)   Monocytes Relative 12  3 - 12 (%)   Monocytes Absolute 0.8  0.1 - 1.0 (K/uL)   Eosinophils Relative 6 (*) 0 - 5 (%)   Eosinophils Absolute 0.4  0.0 - 0.7 (K/uL)   Basophils Relative 1  0 - 1 (%)   Basophils Absolute 0.0  0.0 - 0.1 (K/uL)        *RADIOLOGY REPORT*  Clinical Data: Pelvic pain. Endometriosis.  TRANSVAGINAL ULTRASOUND OF PELVIS  Technique: Transvaginal ultrasound examination of the pelvis was  performed including evaluation of the uterus, ovaries, adnexal  regions, and pelvic cul-de-sac.  Comparison: 02/09/2011  Findings:  Uterus: 7.1 x 4.1 x 3.4 cm. Anteverted, anteflexed. No focal  abnormality.  Endometrium: 2 mm. Uniformly thin and echogenic.  Right ovary: 4.0 x 2.6 x 1.8 cm. Normal.  Left ovary: 3.7 x 2.6 x 1.6 cm. Normal.  Other Findings: Small free fluid noted in the cul-de-sac.  IMPRESSION:  Small amount of free fluid in the cul-de-sac, likely physiologic.  Otherwise normal exam.  Original Report Authenticated By: Harrel Lemon, M.D.      Imaging      MAU Course  Procedures GC/CHL culture to lab  MDM Discussed pain management with patient and she  states she has taken Percocet in the past  without any side effect.  Percocet 1 tab po given in MAU  At 15:12 patient reports that 1 percocet will not help pain and questioning why I ordered ultrasound.  I explained that even though we know she has endometriosis and U/S will not see that, we are looking for ovarian cysts and other causes of pain.  She agreed to go.  I have ordered another Percocet Tablet.  16:00 Patient is back from ultrasound.  Asked about her pain and she states the medication "took the edge off".  She is asking if she can be discharged before 6pm because she has a ride.   Assessment and Plan  A: Pelvic Pain     Hx endometriosis  P:  Message to [facility] CL[facility] to schedule  followup exam--she has seen Dr. Macon Large in past      Percocet Rx given        Goldman Birchall,EVE M 03/12/2011, 1:48 PM   Matt Holmes, NP 03/12/11 1627

## 2011-03-12 NOTE — Progress Notes (Signed)
Notified NP of pt wanting second percocet and refusing U/S. NP talked with pt and percocet ordered and pt agreeable to U/S

## 2011-03-12 NOTE — Progress Notes (Signed)
Paper towel/tampon removed by NP. Foul smelling dark bloody discharge noted.

## 2011-03-12 NOTE — ED Provider Notes (Signed)
Attestation of Attending Supervision of Advanced Practitioner: Evaluation and management procedures were performed by the PA/NP/CNM/OB Fellow under my supervision/collaboration. Chart reviewed, and agree with management and plan.  Katara Griner, M.D. 03/12/2011 4:53 PM   

## 2011-03-14 LAB — GC/CHLAMYDIA PROBE AMP, GENITAL
Chlamydia, DNA Probe: NEGATIVE
GC Probe Amp, Genital: NEGATIVE

## 2011-03-31 ENCOUNTER — Ambulatory Visit: Payer: Self-pay | Admitting: Obstetrics & Gynecology

## 2011-05-07 ENCOUNTER — Inpatient Hospital Stay (HOSPITAL_COMMUNITY)
Admission: AD | Admit: 2011-05-07 | Discharge: 2011-05-07 | Disposition: A | Discharge status: Home / Self Care | Payer: Medicaid Other | Source: Ambulatory Visit | Attending: Obstetrics & Gynecology | Admitting: Obstetrics & Gynecology

## 2011-05-07 ENCOUNTER — Encounter (HOSPITAL_COMMUNITY): Payer: Self-pay

## 2011-05-07 DIAGNOSIS — N938 Other specified abnormal uterine and vaginal bleeding: Secondary | ICD-10-CM | POA: Insufficient documentation

## 2011-05-07 DIAGNOSIS — N390 Urinary tract infection, site not specified: Secondary | ICD-10-CM

## 2011-05-07 DIAGNOSIS — N949 Unspecified condition associated with female genital organs and menstrual cycle: Secondary | ICD-10-CM | POA: Insufficient documentation

## 2011-05-07 LAB — URINE MICROSCOPIC-ADD ON

## 2011-05-07 LAB — URINE CULTURE: Colony Count: >=100000

## 2011-05-07 LAB — URINALYSIS, ROUTINE W REFLEX MICROSCOPIC
Glucose, UA: NEGATIVE mg/dL
Ketones, ur: NEGATIVE mg/dL
Nitrite: POSITIVE — AB
Specific Gravity, Urine: 1.015 (ref 1.005–1.030)
pH: 6 (ref 5.0–8.0)

## 2011-05-07 LAB — CBC
MCH: 30.5 pg (ref 26.0–34.0)
Platelets: 386 10*3/uL (ref 150–400)
RBC: 4 MIL/uL (ref 3.87–5.11)

## 2011-05-07 LAB — POCT PREGNANCY, URINE: Preg Test, Ur: NEGATIVE

## 2011-05-07 MED ORDER — CEPHALEXIN 500 MG PO CAPS: 500.0000 mg | ORAL_CAPSULE | Freq: Four times a day (QID) | ORAL | Status: AC

## 2011-05-07 MED ORDER — KETOROLAC TROMETHAMINE 60 MG/2ML IM SOLN
60.0000 mg | Freq: Once | INTRAMUSCULAR | Status: AC
  Administered 2011-05-07: 60 mg via INTRAMUSCULAR
  Filled 2011-05-07: qty 2

## 2011-05-07 NOTE — ED Provider Notes (Signed)
History   Pt presents today via EMS c/o bleeding in preg. She states she thinks she may be pregnant but she is not sure of her home preg test. She states she thinks she is 2 months pregnant. She states she has recently used cocaine because she was "stressed out." She has a long hx of narcotic abuse. She is requesting dilaudid. She denies fever, vag irritation, or any other sx at this time.  No chief complaint on file.  HPI  OB History    Grav Para Term Preterm Abortions TAB SAB Ect Mult Living   3 1 1  0 2 0 2 0 0 1      Past Medical History  Diagnosis Date  . STD (female)     hx of chlamydia and gonorrhea  . History of PID   . Endometriosis   . Glaucoma   . Ovarian cyst     Past Surgical History  Procedure Date  . Fracture surgery     left leg  . Dilation and curettage of uterus   . Laparoscopy     Family History  Problem Relation Age of Onset  . Anesthesia problems Neg Hx   . Hypotension Neg Hx   . Malignant hyperthermia Neg Hx   . Pseudochol deficiency Neg Hx   . Alcohol abuse Neg Hx     History  Substance Use Topics  . Smoking status: Current Everyday Smoker -- 0.5 packs/day    Types: Cigarettes  . Smokeless tobacco: Not on file  . Alcohol Use: No    Allergies:  Allergies  Allergen Reactions  . Darvocet (Propoxyphene N-Acetaminophen) Hives  . Doxycycline Swelling  . Metoclopramide Hives  . Naproxen Hives  . Latex Rash  . Tramadol Rash    No prescriptions prior to admission    Review of Systems  Constitutional: Negative for fever.  Eyes: Negative for blurred vision and double vision.  Cardiovascular: Negative for chest pain and palpitations.  Gastrointestinal: Positive for abdominal pain. Negative for nausea, vomiting, diarrhea and constipation.  Genitourinary: Negative for dysuria, urgency, frequency and hematuria.  Neurological: Negative for dizziness and headaches.  Psychiatric/Behavioral: Negative for depression and suicidal ideas.    Physical Exam   Blood pressure 103/54, pulse 72, temperature 97.7 F (36.5 C), temperature source Oral, resp. rate 18, last menstrual period 03/21/2011.  Physical Exam  Nursing note and vitals reviewed. Constitutional: She is oriented to person, place, and time. She appears well-developed and well-nourished. No distress.  HENT:  Head: Normocephalic and atraumatic.  Eyes: EOM are normal. Pupils are equal, round, and reactive to light.  GI: Soft. She exhibits no distension and no mass. There is tenderness. There is no rebound and no guarding.  Genitourinary: There is bleeding around the vagina. No vaginal discharge found.       Minimal amount of bleeding noted on exam. Cervix Lg/closed. Uterus NL size and shape. No adnexal masses. Pt tender to palpation.  Neurological: She is alert and oriented to person, place, and time.  Skin: Skin is warm and dry. She is not diaphoretic.  Psychiatric: She has a normal mood and affect. Her behavior is normal. Judgment and thought content normal.    MAU Course  Procedures  Social worker paged to see pt.  Results for orders placed during the hospital encounter of 05/07/11 (from the past 72 hour(s))  URINALYSIS, ROUTINE W REFLEX MICROSCOPIC     Status: Abnormal   Collection Time   05/07/11  5:29 PM  Component Value Range Comment   Color, Urine RED (*) YELLOW  BIOCHEMICALS MAY BE AFFECTED BY COLOR   APPearance CLOUDY (*) CLEAR     Specific Gravity, Urine 1.015  1.005 - 1.030     pH 6.0  5.0 - 8.0     Glucose, UA NEGATIVE  NEGATIVE (mg/dL)    Hgb urine dipstick LARGE (*) NEGATIVE     Bilirubin Urine NEGATIVE  NEGATIVE     Ketones, ur NEGATIVE  NEGATIVE (mg/dL)    Protein, ur 30 (*) NEGATIVE (mg/dL)    Urobilinogen, UA 1.0  0.0 - 1.0 (mg/dL)    Nitrite POSITIVE (*) NEGATIVE     Leukocytes, UA TRACE (*) NEGATIVE    URINE MICROSCOPIC-ADD ON     Status: Abnormal   Collection Time   05/07/11  5:29 PM      Component Value Range Comment    Squamous Epithelial / LPF RARE  RARE     WBC, UA 3-6  <3 (WBC/hpf)    RBC / HPF TOO NUMEROUS TO COUNT  <3 (RBC/hpf)    Bacteria, UA FEW (*) RARE    POCT PREGNANCY, URINE     Status: Normal   Collection Time   05/07/11  5:34 PM      Component Value Range Comment   Preg Test, Ur NEGATIVE     CBC     Status: Abnormal   Collection Time   05/07/11  6:06 PM      Component Value Range Comment   WBC 5.8  4.0 - 10.5 (K/uL)    RBC 4.00  3.87 - 5.11 (MIL/uL)    Hemoglobin 12.2  12.0 - 15.0 (g/dL)    HCT 47.8 (*) 29.5 - 46.0 (%)    MCV 88.5  78.0 - 100.0 (fL)    MCH 30.5  26.0 - 34.0 (pg)    MCHC 34.5  30.0 - 36.0 (g/dL)    RDW 62.1  30.8 - 65.7 (%)    Platelets 386  150 - 400 (K/uL)     Pt given toradol.  Pt refuses to have pelvic US at this time (6:37pm). She states she might reconsider in a little while.  Pt again refuses to go to [place] (7:14pm). Pt also is sleeping.  Pt will be dc'd with Rx for keflex for UTI. She is to f/u with PCP. Assessment and Plan  Non-compliance and UTI: Rx given to pt for keflex. Urine will be sent for culture. Discussed diet, activity, risks, and precautions.  Clinton Gallant. Rice III, DrHSc, MPAS, PA-C  05/07/2011, 5:55 PM   Henrietta Hoover, PA 05/07/11 [id number]

## 2011-05-07 NOTE — ED Notes (Signed)
[name] went into patient's room to take her to ultrasound patient refused to go at this time.
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
## 2011-05-07 NOTE — Progress Notes (Signed)
Upon entering room patient is resting with eyes closed. Asked patient if pain is any better, she turned to lay on her stomach and shook her head no. Asked patient if she would go to ultrasound and she said no. Patient returned to resting with eyes closed.

## 2011-08-30 ENCOUNTER — Emergency Department (HOSPITAL_COMMUNITY): Payer: Self-pay | Attending: Emergency Medicine

## 2011-08-30 ENCOUNTER — Emergency Department (HOSPITAL_COMMUNITY): Payer: Self-pay

## 2011-08-30 ENCOUNTER — Emergency Department (HOSPITAL_COMMUNITY)
Admission: EM | Admit: 2011-08-30 | Discharge: 2011-08-30 | Disposition: A | Discharge status: Home / Self Care | Payer: Self-pay | Attending: Emergency Medicine | Admitting: Emergency Medicine

## 2011-08-30 ENCOUNTER — Emergency Department (HOSPITAL_COMMUNITY): Payer: Medicaid Other

## 2011-08-30 ENCOUNTER — Encounter (HOSPITAL_COMMUNITY): Payer: Self-pay | Admitting: *Deleted

## 2011-08-30 DIAGNOSIS — T07XXXA Unspecified multiple injuries, initial encounter: Secondary | ICD-10-CM

## 2011-08-30 DIAGNOSIS — Y9289 Other specified places as the place of occurrence of the external cause: Secondary | ICD-10-CM | POA: Insufficient documentation

## 2011-08-30 DIAGNOSIS — IMO0002 Reserved for concepts with insufficient information to code with codable children: Secondary | ICD-10-CM | POA: Insufficient documentation

## 2011-08-30 MED ORDER — HYDROCODONE-ACETAMINOPHEN 5-325 MG PO TABS: 1.0000 | ORAL_TABLET | ORAL | Status: AC | PRN

## 2011-08-30 MED ORDER — HYDROMORPHONE HCL PF 1 MG/ML IJ SOLN
1.0000 mg | Freq: Once | INTRAMUSCULAR | Status: AC
  Administered 2011-08-30: 1 mg via INTRAMUSCULAR
  Filled 2011-08-30: qty 1

## 2011-08-30 NOTE — Discharge Instructions (Signed)
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
## 2011-09-06 ENCOUNTER — Emergency Department (HOSPITAL_COMMUNITY)
Admission: EM | Admit: 2011-09-06 | Discharge: 2011-09-07 | Disposition: A | Discharge status: Other Acute Inpatient Hospital | Payer: Self-pay | Attending: Emergency Medicine | Admitting: Emergency Medicine

## 2011-09-06 ENCOUNTER — Encounter (HOSPITAL_COMMUNITY): Payer: Self-pay | Admitting: *Deleted

## 2011-09-06 ENCOUNTER — Other Ambulatory Visit: Payer: Self-pay

## 2011-09-06 DIAGNOSIS — F3289 Other specified depressive episodes: Secondary | ICD-10-CM | POA: Insufficient documentation

## 2011-09-06 DIAGNOSIS — IMO0002 Reserved for concepts with insufficient information to code with codable children: Secondary | ICD-10-CM | POA: Insufficient documentation

## 2011-09-06 DIAGNOSIS — F1910 Other psychoactive substance abuse, uncomplicated: Secondary | ICD-10-CM | POA: Insufficient documentation

## 2011-09-06 DIAGNOSIS — R109 Unspecified abdominal pain: Secondary | ICD-10-CM | POA: Insufficient documentation

## 2011-09-06 DIAGNOSIS — F329 Major depressive disorder, single episode, unspecified: Secondary | ICD-10-CM | POA: Insufficient documentation

## 2011-09-06 LAB — URINALYSIS, ROUTINE W REFLEX MICROSCOPIC
Ketones, ur: NEGATIVE mg/dL
Nitrite: NEGATIVE
Protein, ur: NEGATIVE mg/dL
pH: 6 (ref 5.0–8.0)

## 2011-09-06 LAB — CBC
HCT: 39.2 % (ref 36.0–46.0)
Hemoglobin: 13.5 g/dL (ref 12.0–15.0)
MCV: 85.8 fL (ref 78.0–100.0)
Platelets: 480 10*3/uL — ABNORMAL HIGH (ref 150–400)
RBC: 4.57 MIL/uL (ref 3.87–5.11)
WBC: 6.1 10*3/uL (ref 4.0–10.5)

## 2011-09-06 LAB — DIFFERENTIAL
Eosinophils Relative: 5 % (ref 0–5)
Lymphocytes Relative: 31 % (ref 12–46)
Lymphs Abs: 1.9 10*3/uL (ref 0.7–4.0)
Monocytes Absolute: 0.6 10*3/uL (ref 0.1–1.0)
Monocytes Relative: 10 % (ref 3–12)

## 2011-09-06 LAB — ETHANOL: Alcohol, Ethyl (B): <11 mg/dL (ref 0–11)

## 2011-09-06 LAB — RAPID URINE DRUG SCREEN, HOSP PERFORMED
Cocaine: POSITIVE — AB
Opiates: NOT DETECTED

## 2011-09-06 LAB — COMPREHENSIVE METABOLIC PANEL
Albumin: 3.7 g/dL (ref 3.5–5.2)
BUN: 13 mg/dL (ref 6–23)
Creatinine, Ser: 0.82 mg/dL (ref 0.50–1.10)
Total Protein: 7.3 g/dL (ref 6.0–8.3)

## 2011-09-06 LAB — URINE MICROSCOPIC-ADD ON

## 2011-09-06 MED ORDER — HYDROXYZINE HCL 25 MG PO TABS: 25.0000 mg | ORAL_TABLET | Freq: Four times a day (QID) | ORAL | Status: DC | PRN

## 2011-09-06 MED ORDER — LORAZEPAM 1 MG PO TABS
1.0000 mg | ORAL_TABLET | Freq: Three times a day (TID) | ORAL | Status: DC | PRN
  Administered 2011-09-06 – 2011-09-07 (×2): 1 mg via ORAL
  Filled 2011-09-06 (×2): qty 1

## 2011-09-06 MED ORDER — ONDANSETRON 4 MG PO TBDP: 4.0000 mg | ORAL_TABLET | Freq: Four times a day (QID) | ORAL | Status: DC | PRN

## 2011-09-06 MED ORDER — ACETAMINOPHEN 325 MG PO TABS: 650.0000 mg | ORAL_TABLET | ORAL | Status: DC | PRN

## 2011-09-06 MED ORDER — CLONIDINE HCL 0.1 MG PO TABS
0.1000 mg | ORAL_TABLET | Freq: Four times a day (QID) | ORAL | Status: DC
  Administered 2011-09-06 – 2011-09-07 (×4): 0.1 mg via ORAL
  Filled 2011-09-06 (×4): qty 1

## 2011-09-06 MED ORDER — CLONIDINE HCL 0.1 MG PO TABS: 0.1000 mg | ORAL_TABLET | Freq: Every day | ORAL | Status: DC

## 2011-09-06 MED ORDER — CLONIDINE HCL 0.1 MG PO TABS: 0.1000 mg | ORAL_TABLET | ORAL | Status: DC

## 2011-09-06 MED ORDER — DICYCLOMINE HCL 20 MG PO TABS
20.0000 mg | ORAL_TABLET | Freq: Four times a day (QID) | ORAL | Status: DC | PRN
  Administered 2011-09-07: 20 mg via ORAL
  Filled 2011-09-06: qty 1

## 2011-09-06 MED ORDER — METHOCARBAMOL 500 MG PO TABS
500.0000 mg | ORAL_TABLET | Freq: Three times a day (TID) | ORAL | Status: DC | PRN
  Filled 2011-09-06: qty 1

## 2011-09-06 MED ORDER — ZOLPIDEM TARTRATE 5 MG PO TABS
5.0000 mg | ORAL_TABLET | Freq: Every evening | ORAL | Status: DC | PRN
  Administered 2011-09-06: 5 mg via ORAL
  Filled 2011-09-06: qty 1

## 2011-09-06 MED ORDER — LOPERAMIDE HCL 2 MG PO CAPS: 2.0000 mg | ORAL_CAPSULE | ORAL | Status: DC | PRN

## 2011-09-06 NOTE — ED Notes (Signed)
Pt taken to acute room. Pt states she is going in to withdrawal

## 2011-09-06 NOTE — ED Notes (Signed)
Pt states she want detox  From heroin and cocaine. Pt states he last use was this morning

## 2011-09-06 NOTE — ED Notes (Signed)
Pt brought back to the unit and immediately demanding food, per Nurse Tech, pt had just eaten a sandwich, given some cheese and crackers and peanut butter. Pt advised that she can have some peanut butter and crackers and some more cheese, pt then starts to yell even louder and state I have not eaten in 2 days and that's all you gone give me to eat. Pt advised that is all we have for her to eat, so that is all she can have, pt then advised that all of our other pt's are trying to rest and she needs to lower her voice and go back to her room if she could not be cooperative.

## 2011-09-06 NOTE — ED Provider Notes (Signed)
History     CSN: [id number]  Arrival date & time 09/06/11  1847   First MD Initiated Contact with Patient 09/06/11 1956      Chief Complaint  Patient presents with  . Medical Clearance    HPI Pt is requesting detox from heroin and crack. Pt states she is not sure how much she is using.  She last used crack this am.  The last time she used heroin was 2 days ago.  Pt states she is very hungry.  Pt states she is agitated, her stomach hurts.  Pt has not eaten anything because her appetite was not good but she is hungry now.  No vomiting, diarrhea. No fevers or cough. No dysuria. Past Medical History  Diagnosis Date  . STD (female)     hx of chlamydia and gonorrhea  . History of PID   . Endometriosis   . Glaucoma   . Ovarian cyst     Past Surgical History  Procedure Date  . Fracture surgery     left leg  . Dilation and curettage of uterus   . Laparoscopy     Family History  Problem Relation Age of Onset  . Anesthesia problems Neg Hx   . Hypotension Neg Hx   . Malignant hyperthermia Neg Hx   . Pseudochol deficiency Neg Hx   . Alcohol abuse Neg Hx     History  Substance Use Topics  . Smoking status: Current Everyday Smoker -- 0.5 packs/day    Types: Cigarettes  . Smokeless tobacco: Not on file  . Alcohol Use: Yes    OB History    Grav Para Term Preterm Abortions TAB SAB Ect Mult Living   3 1 1  0 2 0 2 0 0 1      Review of Systems  All other systems reviewed and are negative.    Allergies  Darvocet; Doxycycline; Flexeril; Metoclopramide; Naproxen; Latex; and Tramadol  Home Medications   Current Outpatient Rx  Name Route Sig Dispense Refill  . HYDROCODONE-ACETAMINOPHEN 5-325 MG PO TABS Oral Take 1 tablet by mouth every 4 (four) hours as needed for pain. 12 tablet 0  . IBUPROFEN 200 MG PO TABS Oral Take 400 mg by mouth every 8 (eight) hours as needed. For pain.      BP 124/76  Pulse 98  Temp 98 F (36.7 C)  Resp 20  SpO2 98%  Physical Exam    Nursing note and vitals reviewed. Constitutional: She appears well-developed and well-nourished. No distress.  HENT:  Head: Normocephalic and atraumatic.  Right Ear: External ear normal.  Left Ear: External ear normal.  Eyes: Conjunctivae are normal. Right eye exhibits no discharge. Left eye exhibits no discharge. No scleral icterus.  Neck: Neck supple. No tracheal deviation present.  Cardiovascular: Normal rate, regular rhythm and intact distal pulses.   Pulmonary/Chest: Effort normal and breath sounds normal. No stridor. No respiratory distress. She has no wheezes. She has no rales.  Abdominal: Soft. Bowel sounds are normal. She exhibits no distension. There is no tenderness. There is no rebound and no guarding.  Musculoskeletal: She exhibits no edema and no tenderness.  Neurological: She is alert. She has normal strength. No sensory deficit. Cranial nerve deficit:  no gross defecits noted. She exhibits normal muscle tone. She displays no seizure activity. Coordination normal.  Skin: Skin is warm and dry. No rash noted.  Psychiatric: Her affect is angry and labile. Her speech is not rapid and/or pressured and not slurred.  She is agitated. Thought content is not delusional. She exhibits a depressed mood. She expresses no homicidal and no suicidal ideation.    ED Course  Procedures (including critical care time)  Rate: 94  Rhythm: normal sinus rhythm  QRS Axis: normal  Intervals: normal  ST/T Wave abnormalities: Nonspecific T wave changes  Conduction Disutrbances:none  Old EKG Reviewed: none available  Labs Reviewed  URINALYSIS, ROUTINE W REFLEX MICROSCOPIC - Abnormal; Notable for the following:    APPearance TURBID (*)    Hgb urine dipstick TRACE (*)    Leukocytes, UA TRACE (*)    All other components within normal limits  COMPREHENSIVE METABOLIC PANEL - Abnormal; Notable for the following:    Glucose, Bld 120 (*)    Total Bilirubin 0.2 (*)    All other components within normal  limits  CBC - Abnormal; Notable for the following:    Platelets 480 (*)    All other components within normal limits  URINE MICROSCOPIC-ADD ON - Abnormal; Notable for the following:    Squamous Epithelial / LPF MANY (*)    All other components within normal limits  PREGNANCY, URINE  DIFFERENTIAL  ACETAMINOPHEN LEVEL  ETHANOL  URINE RAPID DRUG SCREEN (HOSP PERFORMED)   No results found.    MDM  Pt appears to be medically stable.  She is here requesting treatment for substance abuse problems.  Will consult act team to evaluate her for possible treatment.        Celene Kras, MD 09/06/11 2105

## 2011-09-07 ENCOUNTER — Encounter (HOSPITAL_COMMUNITY): Payer: Self-pay | Admitting: *Deleted

## 2011-09-07 ENCOUNTER — Inpatient Hospital Stay (HOSPITAL_COMMUNITY)
Admission: AD | Admit: 2011-09-07 | Discharge: 2011-09-13 | DRG: 897 | Disposition: A | Discharge status: Home / Self Care | Payer: Federal, State, Local not specified - Other | Source: Ambulatory Visit | Attending: Psychiatry | Admitting: Psychiatry

## 2011-09-07 DIAGNOSIS — F19939 Other psychoactive substance use, unspecified with withdrawal, unspecified: Principal | ICD-10-CM | POA: Diagnosis present

## 2011-09-07 DIAGNOSIS — F609 Personality disorder, unspecified: Secondary | ICD-10-CM | POA: Diagnosis present

## 2011-09-07 DIAGNOSIS — F39 Unspecified mood [affective] disorder: Secondary | ICD-10-CM | POA: Diagnosis present

## 2011-09-07 DIAGNOSIS — N72 Inflammatory disease of cervix uteri: Secondary | ICD-10-CM | POA: Diagnosis present

## 2011-09-07 DIAGNOSIS — F1920 Other psychoactive substance dependence, uncomplicated: Secondary | ICD-10-CM | POA: Diagnosis present

## 2011-09-07 DIAGNOSIS — B3789 Other sites of candidiasis: Secondary | ICD-10-CM | POA: Diagnosis present

## 2011-09-07 DIAGNOSIS — N809 Endometriosis, unspecified: Secondary | ICD-10-CM | POA: Diagnosis present

## 2011-09-07 DIAGNOSIS — A499 Bacterial infection, unspecified: Secondary | ICD-10-CM

## 2011-09-07 DIAGNOSIS — B373 Candidiasis of vulva and vagina: Secondary | ICD-10-CM

## 2011-09-07 DIAGNOSIS — R102 Pelvic and perineal pain: Secondary | ICD-10-CM

## 2011-09-07 DIAGNOSIS — F1120 Opioid dependence, uncomplicated: Secondary | ICD-10-CM

## 2011-09-07 DIAGNOSIS — Z79899 Other long term (current) drug therapy: Secondary | ICD-10-CM

## 2011-09-07 DIAGNOSIS — N760 Acute vaginitis: Secondary | ICD-10-CM

## 2011-09-07 DIAGNOSIS — H409 Unspecified glaucoma: Secondary | ICD-10-CM | POA: Diagnosis present

## 2011-09-07 MED ORDER — NICOTINE 21 MG/24HR TD PT24
21.0000 mg | MEDICATED_PATCH | Freq: Every day | TRANSDERMAL | Status: DC
  Administered 2011-09-10 – 2011-09-13 (×4): 21 mg via TRANSDERMAL
  Filled 2011-09-07 (×7): qty 1

## 2011-09-07 MED ORDER — HYDROXYZINE HCL 25 MG PO TABS
25.0000 mg | ORAL_TABLET | ORAL | Status: DC | PRN
  Administered 2011-09-07 – 2011-09-08 (×2): 25 mg via ORAL

## 2011-09-07 MED ORDER — ALUM & MAG HYDROXIDE-SIMETH 200-200-20 MG/5ML PO SUSP: 30.0000 mL | ORAL | Status: DC | PRN

## 2011-09-07 MED ORDER — ACETAMINOPHEN 325 MG PO TABS
650.0000 mg | ORAL_TABLET | Freq: Four times a day (QID) | ORAL | Status: DC | PRN
  Administered 2011-09-08 – 2011-09-11 (×3): 650 mg via ORAL
  Filled 2011-09-07: qty 2

## 2011-09-07 MED ORDER — MAGNESIUM HYDROXIDE 400 MG/5ML PO SUSP
30.0000 mL | Freq: Every day | ORAL | Status: DC | PRN
  Filled 2011-09-07: qty 30

## 2011-09-07 NOTE — ED Provider Notes (Signed)
The patient was accepted at [facility] by Dr. Peyton Bottoms, MD 09/07/11 [phone]

## 2011-09-07 NOTE — ED Notes (Signed)
Given sprite, applesauce and cheese, ate breakfast and is still hungry

## 2011-09-07 NOTE — Progress Notes (Signed)
Invited pt to join behavioral health group being facilitated in Psych ED, pt declined stating she was too weak.  Jasminemarie Sherrard B MS, LPCA, NCC

## 2011-09-07 NOTE — ED Notes (Signed)
Per Camelia Eng, in [facility] assessment, pt has been accepted by Roxy Cedar NP/Dr. Readling to bed 303-1. EDP has been notified and is in agreement with disposition. RN made aware to call report. Pt updated and agrees to transfer. All support paper work has been completed and faxed to [facility] for review. No further CSW needs identified at this time.

## 2011-09-07 NOTE — Progress Notes (Signed)
8:16 AM Review of pt's orders shows she has an order for lorazepam for anxiety, so Klonopin not prescribed.

## 2011-09-07 NOTE — BH Assessment (Signed)
Assessment Note   [name] is a 26 y.o. female who presents to [facility] for detox, denies SI/HI.  Pt is irritable and says--"I don't want to talk, I'm sick".  Pt is tearful.  Pt using Heroin, Crack(1 gram), and THC.  Pt denies alcohol.  Pt is homeless.  Pt is unable to tell this writer how much she's using on daily.  Pt c/o w/d sxs: agitation, cramps, shakes, body aches.  Pt says started using 1 yr ago, because stressed out about life.    Axis I: Substance Abuse Axis II: Deferred Axis III:  Past Medical History  Diagnosis Date  . STD (female)     hx of chlamydia and gonorrhea  . History of PID   . Endometriosis   . Glaucoma   . Ovarian cyst    Axis IV: housing problems, other psychosocial or environmental problems, problems related to social environment and problems with primary support group Axis V: 51-60 moderate symptoms  Past Medical History:  Past Medical History  Diagnosis Date  . STD (female)     hx of chlamydia and gonorrhea  . History of PID   . Endometriosis   . Glaucoma   . Ovarian cyst     Past Surgical History  Procedure Date  . Fracture surgery     left leg  . Dilation and curettage of uterus   . Laparoscopy     Family History:  Family History  Problem Relation Age of Onset  . Anesthesia problems Neg Hx   . Hypotension Neg Hx   . Malignant hyperthermia Neg Hx   . Pseudochol deficiency Neg Hx   . Alcohol abuse Neg Hx     Social History:  reports that she has been smoking Cigarettes.  She has been smoking about .5 packs per day. She does not have any smokeless tobacco history on file. She reports that she drinks alcohol. She reports that she uses illicit drugs (Marijuana, Cocaine, and Heroin).  Additional Social History:  Alcohol / Drug Use Pain Medications: None  Prescriptions: None  Over the Counter: None  History of alcohol / drug use?: Yes Longest period of sobriety (when/how long): None  Negative Consequences of Use: Personal  relationships Withdrawal Symptoms: Agitation;Cramps;Fever / Chills;Tremors (Body Aches ) Substance #1 Name of Substance 1: Heroin  1 - Age of First Use: Teens  1 - Amount (size/oz): "I don't know"  1 - Frequency: Daily  1 - Duration: On-going  1 - Last Use / Amount: 09/06/11 Substance #2 Name of Substance 2: Crack  2 - Age of First Use: Teens  2 - Amount (size/oz): 1 Gram  2 - Frequency: Daily  2 - Duration: On-going  2 - Last Use / Amount: 09/06/11 Substance #3 Name of Substance 3: THC  3 - Age of First Use: 15 YOF  3 - Amount (size/oz): "I don't know" 3 - Frequency: Daily  3 - Duration: On-going  3 - Last Use / Amount: 09/06/11 Allergies:  Allergies  Allergen Reactions  . Darvocet (Propoxyphene-Acetaminophen) Hives  . Doxycycline Swelling  . Flexeril (Cyclobenzaprine)   . Metoclopramide Hives  . Naproxen Hives  . Latex Rash  . Tramadol Rash    Home Medications:  (Not in a hospital admission)  OB/GYN Status:  No LMP recorded. Patient is not currently having periods (Reason: Other).  General Assessment Data Location of Assessment: [facility] Living Arrangements: Other (Comment) (Homeless) Can pt return to current living arrangement?: Yes Admission Status: Voluntary Is  patient capable of signing voluntary admission?: Yes Transfer from: Acute Hospital Referral Source: MD  Education Status Is patient currently in school?: No Current Grade: None  Highest grade of school patient has completed: Unk  Name of school: [employer]  Contact person: None   Risk to self Suicidal Ideation: No Suicidal Intent: No Is patient at risk for suicide?: No Suicidal Plan?: No Access to Means: No What has been your use of drugs/alcohol within the last 12 months?: Abusing: heroin, Crack, THC Previous Attempts/Gestures: No How many times?: 0  Other Self Harm Risks: None  Triggers for Past Attempts: None known Intentional Self Injurious Behavior: None Family Suicide History: No Recent  stressful life event(s): Other (Comment) (None Reported ) Persecutory voices/beliefs?: No Depression: Yes Depression Symptoms: Feeling angry/irritable Substance abuse history and/or treatment for substance abuse?: Yes Suicide prevention information given to non-admitted patients: Not applicable  Risk to Others Homicidal Ideation: No Thoughts of Harm to Others: No Current Homicidal Intent: No Current Homicidal Plan: No Access to Homicidal Means: No Identified Victim: None  History of harm to others?: No Assessment of Violence: None Noted Violent Behavior Description: None  Does patient have access to weapons?: No Criminal Charges Pending?: No Does patient have a court date: No  Psychosis Hallucinations: None noted Delusions: None noted  Mental Status Report Appear/Hygiene: Disheveled Eye Contact: Poor Motor Activity: Unremarkable Speech: Logical/coherent Level of Consciousness: Irritable Mood: Irritable Affect: Irritable Anxiety Level: Minimal Thought Processes: Coherent;Relevant Judgement: Unimpaired Orientation: Person;Place;Time;Situation Obsessive Compulsive Thoughts/Behaviors: None  Cognitive Functioning Concentration: Normal Memory: Recent Intact;Remote Intact IQ: Average Insight: Fair Impulse Control: Fair Appetite: Good Weight Loss: 0  Weight Gain: 0  Sleep: No Change Total Hours of Sleep: 5  Vegetative Symptoms: None  Prior Inpatient Therapy Prior Inpatient Therapy: Yes Prior Therapy Dates: [facility]  Prior Therapy Facilty/Provider(s): 2012 Reason for Treatment: MH/SA  Prior Outpatient Therapy Prior Outpatient Therapy: No Prior Therapy Dates: None  Prior Therapy Facilty/Provider(s): None  Reason for Treatment: None   ADL Screening (condition at time of admission) Patient's cognitive ability adequate to safely complete daily activities?: Yes Patient able to express need for assistance with ADLs?: Yes Independently performs ADLs?: Yes Weakness of  Legs: None Weakness of Arms/Hands: None       Abuse/Neglect Assessment (Assessment to be complete while patient is alone) Physical Abuse: Denies Verbal Abuse: Denies Sexual Abuse: Denies Exploitation of patient/patient's resources: Denies Self-Neglect: Denies Values / Beliefs Cultural Requests During Hospitalization: None Spiritual Requests During Hospitalization: None Consults Spiritual Care Consult Needed: No Social Work Consult Needed: No Merchant navy officer (For Healthcare) Advance Directive: Patient does not have advance directive;Patient would not like information Pre-existing out of facility DNR order (yellow form or pink MOST form): No    Additional Information 1:1 In Past 12 Months?: No CIRT Risk: No Elopement Risk: No Does patient have medical clearance?: Yes     Disposition:  Disposition Disposition of Patient: Referred to Patient referred to: [facility];RTS  On Site Evaluation by:   Reviewed with Physician:     Murrell Redden 09/07/2011 2:38 AM

## 2011-09-07 NOTE — ED Notes (Signed)
Informed during ACT pass-off that Patty @ [facility] is requesting UDS and vitals on pt. She will review once these have been complete. Informed RN that these are needed & request order.

## 2011-09-07 NOTE — Progress Notes (Signed)
Pt tearful, anxious.  Says she takes Klonopin for anxiety.  Will prescribe.

## 2011-09-07 NOTE — ED Notes (Signed)
Given applesauce and cheese x1
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
## 2011-09-08 DIAGNOSIS — F1420 Cocaine dependence, uncomplicated: Secondary | ICD-10-CM

## 2011-09-08 DIAGNOSIS — F1220 Cannabis dependence, uncomplicated: Secondary | ICD-10-CM

## 2011-09-08 DIAGNOSIS — F1320 Sedative, hypnotic or anxiolytic dependence, uncomplicated: Secondary | ICD-10-CM

## 2011-09-08 DIAGNOSIS — F39 Unspecified mood [affective] disorder: Secondary | ICD-10-CM

## 2011-09-08 MED ORDER — CHLORDIAZEPOXIDE HCL 25 MG PO CAPS
25.0000 mg | ORAL_CAPSULE | ORAL | Status: AC
  Administered 2011-09-11 (×2): 25 mg via ORAL
  Filled 2011-09-08 (×2): qty 1

## 2011-09-08 MED ORDER — CLONIDINE HCL 0.1 MG PO TABS
0.1000 mg | ORAL_TABLET | Freq: Four times a day (QID) | ORAL | Status: AC
  Administered 2011-09-08 – 2011-09-10 (×10): 0.1 mg via ORAL
  Filled 2011-09-08 (×11): qty 1

## 2011-09-08 MED ORDER — CHLORDIAZEPOXIDE HCL 25 MG PO CAPS
25.0000 mg | ORAL_CAPSULE | Freq: Three times a day (TID) | ORAL | Status: AC
  Administered 2011-09-10 (×3): 25 mg via ORAL
  Filled 2011-09-08 (×3): qty 1

## 2011-09-08 MED ORDER — ONDANSETRON 4 MG PO TBDP: 4.0000 mg | ORAL_TABLET | Freq: Four times a day (QID) | ORAL | Status: DC | PRN

## 2011-09-08 MED ORDER — BACITRACIN-NEOMYCIN-POLYMYXIN OINTMENT TUBE
TOPICAL_OINTMENT | Freq: Every day | CUTANEOUS | Status: DC
  Administered 2011-09-08 – 2011-09-09 (×2): 1 via TOPICAL
  Administered 2011-09-10 – 2011-09-11 (×2): via TOPICAL
  Filled 2011-09-08: qty 15

## 2011-09-08 MED ORDER — CHLORDIAZEPOXIDE HCL 25 MG PO CAPS
25.0000 mg | ORAL_CAPSULE | Freq: Once | ORAL | Status: AC
  Administered 2011-09-08: 25 mg via ORAL
  Filled 2011-09-08: qty 1

## 2011-09-08 MED ORDER — ADULT MULTIVITAMIN W/MINERALS CH
1.0000 | ORAL_TABLET | Freq: Every day | ORAL | Status: DC
  Administered 2011-09-08 – 2011-09-13 (×6): 1 via ORAL
  Filled 2011-09-08 (×7): qty 1

## 2011-09-08 MED ORDER — CLONIDINE HCL 0.1 MG PO TABS
0.1000 mg | ORAL_TABLET | Freq: Every day | ORAL | Status: AC
  Administered 2011-09-12 (×2): 0.1 mg via ORAL
  Filled 2011-09-08 (×2): qty 1

## 2011-09-08 MED ORDER — LOPERAMIDE HCL 2 MG PO CAPS: 2.0000 mg | ORAL_CAPSULE | ORAL | Status: DC | PRN

## 2011-09-08 MED ORDER — DICYCLOMINE HCL 20 MG PO TABS
20.0000 mg | ORAL_TABLET | Freq: Four times a day (QID) | ORAL | Status: DC | PRN
  Administered 2011-09-10: 20 mg via ORAL
  Filled 2011-09-08 (×2): qty 1

## 2011-09-08 MED ORDER — METHOCARBAMOL 500 MG PO TABS
500.0000 mg | ORAL_TABLET | Freq: Three times a day (TID) | ORAL | Status: DC | PRN
  Administered 2011-09-08: 500 mg via ORAL
  Filled 2011-09-08 (×3): qty 1

## 2011-09-08 MED ORDER — CHLORDIAZEPOXIDE HCL 25 MG PO CAPS
25.0000 mg | ORAL_CAPSULE | Freq: Four times a day (QID) | ORAL | Status: AC
  Administered 2011-09-08 – 2011-09-09 (×6): 25 mg via ORAL
  Filled 2011-09-08 (×6): qty 1

## 2011-09-08 MED ORDER — CHLORDIAZEPOXIDE HCL 25 MG PO CAPS
25.0000 mg | ORAL_CAPSULE | Freq: Every day | ORAL | Status: AC
  Administered 2011-09-12: 25 mg via ORAL
  Filled 2011-09-08: qty 1

## 2011-09-08 MED ORDER — CLONIDINE HCL 0.1 MG PO TABS
0.1000 mg | ORAL_TABLET | ORAL | Status: AC
  Administered 2011-09-10 – 2011-09-12 (×5): 0.1 mg via ORAL
  Filled 2011-09-08 (×5): qty 1

## 2011-09-08 NOTE — Tx Team (Signed)
Initial Interdisciplinary Treatment Plan  PATIENT STRENGTHS: (choose at least two) Capable of independent living General fund of knowledge Motivation for treatment/growth  PATIENT STRESSORS: Financial difficulties Loss of son* Substance abuse Traumatic event   PROBLEM LIST: Problem List/Patient Goals Date to be addressed Date deferred Reason deferred Estimated date of resolution  Depression-unable to see son      Substance abuse      Risk for self harm                                           DISCHARGE CRITERIA:  Ability to meet basic life and health needs Adequate post-discharge living arrangements Improved stabilization in mood, thinking, and/or behavior Motivation to continue treatment in a less acute level of care Safe-care adequate arrangements made Verbal commitment to aftercare and medication compliance Withdrawal symptoms are absent or subacute and managed without 24-hour nursing intervention  PRELIMINARY DISCHARGE PLAN: Attend aftercare/continuing care group Attend 12-step recovery group Outpatient therapy Placement in alternative living arrangements  PATIENT/FAMIILY INVOLVEMENT: This treatment plan has been presented to and reviewed with the patient, [name], and/or family member.  The patient and family have been given the opportunity to ask questions and make suggestions.  Jesus Genera [facility] 09/08/2011, 2:32 AM

## 2011-09-08 NOTE — Progress Notes (Signed)
Patient denies SI/HI and A/V hallucinations.Patient isolates to room most of the time, patient attempted to attend group activity tonight but returned to room after 15 minutes. Support and encouragement given. Cold pack given for right knee soreness. Patient appears asleep and relaxed. Safety checks Q15 minutes continue, will continue to monitor.
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
## 2011-09-08 NOTE — [facility] Suicide Risk Assessment (Signed)
Suicide Risk Assessment  Admission Assessment      Demographic factors:  See chart.  Current Mental Status:  Patient seen and evaluated. Chart reviewed. Patient stated that her mood was "not good". Her affect was mood congruent and labile.  Crying uncontrollably. Nevertheless, she denied any current thoughts of self injurious behavior, suicidal ideation or homicidal ideation. There were no auditory or visual hallucinations, paranoia, delusional thought processes, or mania noted.  Thought process was linear and goal directed.  Sig psychomotor agitation noted. Speech was normal rate, tone and increased volume. Eye contact was poor. Judgment and insight are limited.  Patient has not been up nor engaged on the unit.  No acute safety concerns reported from team.  Sig withdrawal s/s noted at this time.  Loss Factors: Decrease in vocational status;Loss of significant relationship;Decline in physical health;Financial problems / change in socioeconomic status  Historical Factors: Recently on Lithium w/out reported efficacy; Requesting "Klonopin" for anxiety; wants to get clean for her child  Risk Reduction Factors: Sense of responsibility to family; daughter  CLINICAL FACTORS: Heroin Dependence & W/D; Benzodiazepine W/D; Cocaine and Cannabis Dependence; Mood Disorder NOS  COGNITIVE FEATURES THAT CONTRIBUTE TO RISK: limited insight; impulsivity.  SUICIDE RISK:  Pt viewed as a chronic increased risk of harm to self in light of her past hx and risk factors.  No acute safety concerns on the unit.  Pt contracting for safety and in need of crisis stabilization, detox & Tx.  PLAN OF CARE: Pt admitted for crisis stabilization, detox and treatment.  Please see orders.   Medications reviewed with pt and medication education provided.  Pt seen and evaluated with physician extender, please see H&P.  Will continue q15 minute checks per unit protocol.  No clinical indication for one on one level of observation at this  time.  Pt contracting for safety.  Mental health treatment, medication management and continued sobriety will mitigate against the increased risk of harm to self and/or others.  Discussed the importance of recovery with pt, as well as, tools to move forward in a healthy & safe manner.  Pt agreeable with the plan.  Discussed with the team.   Lupe Carney 09/08/2011, 2:24 PM

## 2011-09-08 NOTE — Discharge Planning (Signed)
[name] did not attend AM group.  When I approached her this afternoon, she was extremely agitated, anxious and complaining of withdrawal symptoms.  Upset because "nothing the nurse is giving me is working."  Unable to carry on any meaningful conversation about d/c planning.

## 2011-09-08 NOTE — Progress Notes (Signed)
Pt upset, crying and agitated. Pt requesting to see the MD now for detox medication/protocol. Pt given Vistaril at 1117.Pt unable to go to groups, "I am going to stay in my room and sleep." AM VSS, will check noon meds.

## 2011-09-09 DIAGNOSIS — F1120 Opioid dependence, uncomplicated: Secondary | ICD-10-CM | POA: Diagnosis present

## 2011-09-09 NOTE — Progress Notes (Signed)
[facility] Group Notes:  (Counselor/Nursing/MHT/Case Management/Adjunct)  09/09/2011 2:22 PM  Type of Therapy:  Psychoeducational Skills  Participation Level:  None  Participation Quality:  Drowsy and Resistant  Affect:  Angry, Flat and Irritable  Cognitive:  Appropriate  Insight:  Limited  Engagement in Group:  None  Engagement in Therapy:  None  Modes of Intervention:  Education  Summary of Progress/Problems:Pt attended group but did not participate. Pt just sat in group, slumped over with eyes closed most of the time.    Dalia Heading 09/09/2011, 2:22 PM

## 2011-09-09 NOTE — Progress Notes (Signed)
Patient resting quietly in room. Patient denies SI/HI and A/V hallucinations. Patient c/o anxiety and restless feelings. Patient given cold pack for soreness to left knee. Patient refuses to attend group this evening, patient states "I don't need AA I am not an alcoholic." Support and encouragement given. Will continue to monitor.

## 2011-09-09 NOTE — Progress Notes (Signed)
Patient signed consent for [facility] to contact two friends and then rescinded consent on one of the friends leaving [place] with Clementeen Graham to contact. Homero Fellers at [phone] reports his concerns consist of:  1. He only recently meet patient yet has offered to drive her to detox several times 2. Patient needs longer than 3-5 day detox 3. Patient's report that he will provide hotel room for patient is false  Clide Dales 09/09/2011 5:21 PM

## 2011-09-09 NOTE — Treatment Plan (Addendum)
Interdisciplinary Treatment Plan Update (Adult)  Date: 09/09/2011  Time Reviewed: 8:19 AM   Progress in Treatment: Attending groups: Yes Participating in groups: Yes Taking medication as prescribed: Yes Tolerating medication: Yes   Family/Significant other contact made: Yes, by counselor for suicide prevention information if pt gives permission  Patient understands diagnosis:  Yes  As evidenced by asking for help with withdrawal symptoms Discussing patient identified problems/goals with staff:  Yes Medical problems stabilized or resolved:  Yes Denies suicidal/homicidal ideation: Yes  In AM group Issues/concerns per patient self-inventory:  Not filled out Other:  New problem(s) identified: N/A  Reason for Continuation of Hospitalization: Medication stabilization Withdrawal symptoms  Interventions implemented related to continuation of hospitalization: Librium taper, clonodine taper,  Encourage group attendance and participation  Additional comments: [name] states she has a job waiting for her at [employer] s/p d/c.  States they will also find her an apartment.  Until then, says a friend will pay for her to stay in hotel.  Offered her referral to [facility].  She declined.  Left message for [name] at [facility] about possible screening there.  Estimated length of stay:2-3 days  Discharge Plan:Unknown  New goal(s): N/A  Review of initial/current patient goals per problem list:    1.  Goal(s):Safely detox from opiates and benzos  Met:  No  Target date:5/20  As evidenced by:No withdrawal symptoms, stable vitals  2.  Goal (s):Identify comprehensive sobriety plan  Met:  No  Target date:5/20  As evidenced WU:JWJX report  3.  Goal(s):Eliminate SI  Met:  Yes  Target date:5/17  As evidenced BJ:YNWGN denides SI today  4.  Goal(s):Decrease depression  Met:  No  Target date:5/20  As evidenced FA:OZHYQM of 4 or less on self inventory  Attendees: Patient:   09/09/2011 8:19 AM    Family:     Physician:  Lupe Carney 09/09/2011 8:19 AM   Nursing:    09/09/2011 8:19 AM   Case Manager:  Richelle Ito, LCSW 09/09/2011 8:19 AM   Counselor:  Ronda Fairly, LCSWA 09/09/2011 8:19 AM   Other:     Other:     Other:     Other:      Scribe for Treatment Team:   Ida Rogue, 09/09/2011 8:19 AM

## 2011-09-09 NOTE — Progress Notes (Addendum)
Nutrition Note  Pt triggered for Nutrition Risk secondary to pregnant and unintentional weight loss.  Chart reviewed.  Pt admitted for detox.  States that she has not been eating well for the past 2 months secondary to stress, drugs.  Pt currently states that she is always hungry and eating very well (reg diet).  Missed snack this afternoon.  Ht:  5'3"  Wt:  129#, UBW:  139#.  93% UBW, BMI=22.9  Gave pt snack and encouraged continued good intake.  Obtained snack for patient. Continue regular diet with tid snacks.  Oran Rein, RD [phone]

## 2011-09-09 NOTE — Progress Notes (Signed)
[facility] Group Notes:  (Counselor/Nursing/MHT/Case Management/Adjunct)  09/09/2011 5:11 PM  Type of Therapy:  Group Therapy at 11 and 1:15  Participation Level:  Did Not Attend  [name] 09/09/2011, 5:11 PM

## 2011-09-09 NOTE — Progress Notes (Signed)
Patient has been doing much better today. Her mood is much less labile since receiving consistent detox medications. Has been attending groups today. Pleasant during interactions with Clinical research associate. Patient expressed sadness over his situation in life. Patient is receptive to emotional support and encouragement. Denies SI to Clinical research associate. Remains safe on the unit and verbalizes needs to staff.

## 2011-09-09 NOTE — Progress Notes (Signed)
[facility] Adult Inpatient Family/Significant Other Suicide Prevention Education  Suicide Prevention Education:  Education Completed; [name], friend at [phone] has been identified as the person(s) who has aided the patient in instance of a mental health crisis, willing to  transport her to detox..  With written consent from the patient, the family member/significant other has been provided the following suicide prevention education, prior to the and/or following the discharge of the patient.  The suicide prevention education provided includes the following:  Suicide risk factors  Suicide prevention and interventions  National Suicide Hotline telephone number  [facility] assessment telephone number  [facility] Emergency Assistance 911  [facility] and/or Residential Mobile Crisis Unit telephone number  Request made of family/significant other to:  Remove weapons (e.g., guns, rifles, knives), all items previously/currently identified as safety concern.    Remove drugs/medications (over-the-counter, prescriptions, illicit drugs), all items previously/currently identified as a safety concern.  [name] is a recent friend and is unaware as to whether or not patient has access to any firearms.   The friend verbalizes understanding of the suicide prevention education information provided.  The family member/significant other agrees to remove the items of safety concern listed above.  [name] 09/09/2011, 5:15 PM

## 2011-09-09 NOTE — Progress Notes (Signed)
[facility] MD Progress Note  09/09/2011 3:37 PM  S/O: Patient seen and evaluated. Chart reviewed. Patient stated that her mood was "not good". Her affect was mood congruent and labile. [name] crying uncontrollably. Nevertheless, she denied any current thoughts of self injurious behavior, suicidal ideation or homicidal ideation. There were no auditory or visual hallucinations, paranoia, delusional thought processes, or mania noted. Thought process was linear and goal directed. Sig psychomotor agitation noted. Speech was normal rate, tone and increased volume. Eye contact was poor. Judgment and insight are limited. Patient has not been up nor engaged on the unit. No acute safety concerns reported from team. Sig withdrawal s/s noted at this time.   Sleep:  Number of Hours: 3    Vital Signs:Blood pressure 100/59, pulse 81, temperature 98.2 F (36.8 C), temperature source Oral, resp. rate 12, height 5\' 3"  (1.6 m), weight 58.514 kg (129 lb), last menstrual period 09/07/2010.  Lab Results: No results found for this or any previous visit (from the past 48 hour(s)).  Physical Findings: CIWA:  CIWA-Ar Total: 4  COWS:  COWS Total Score: 14   A/P: Heroin Dependence & W/D; Benzodiazepine W/D; Cocaine and Cannabis Dependence; Mood Disorder NOS; PD NOS with Cluster B traits  Pt in withdrawal with sig mood instability.  Pt willing to start mood stabilizer in addition to her librium and clonidine tapers for withdrawal. Initiated Depakote ER 500mg  po qd.  Medication education completed.  Pros, cons, risks, potential side effects and benefits (including no treatment) were discussed with pt.  Pt agreeable with the plan.  See orders.  Discussed with team.  Lupe Carney 09/09/2011, 3:37 PM

## 2011-09-09 NOTE — Progress Notes (Signed)
[facility] Group Notes:  (Counselor/Nursing/MHT/Case Management/Adjunct)  09/09/2011 7:53 AM  Type of Therapy:  Group Therapy at 11AM and  1:15PM on Thursday 09/08/11  Participation Level:  Did Not Attend   Clide Dales 09/09/2011, 7:53 AM

## 2011-09-10 ENCOUNTER — Encounter (HOSPITAL_COMMUNITY): Payer: Self-pay

## 2011-09-10 LAB — WET PREP, GENITAL

## 2011-09-10 LAB — URINALYSIS, ROUTINE W REFLEX MICROSCOPIC
Bilirubin Urine: NEGATIVE
Hgb urine dipstick: NEGATIVE
Ketones, ur: NEGATIVE mg/dL
Nitrite: NEGATIVE
Protein, ur: NEGATIVE mg/dL
Urobilinogen, UA: 0.2 mg/dL (ref 0.0–1.0)

## 2011-09-10 LAB — URINE MICROSCOPIC-ADD ON

## 2011-09-10 MED ORDER — AZITHROMYCIN 500 MG PO TABS
1000.0000 mg | ORAL_TABLET | Freq: Every day | ORAL | Status: DC
  Administered 2011-09-10: 1000 mg via ORAL
  Filled 2011-09-10: qty 4
  Filled 2011-09-10 (×2): qty 2

## 2011-09-10 MED ORDER — FLUCONAZOLE 150 MG PO TABS: 150.0000 mg | ORAL_TABLET | Freq: Once | ORAL | Status: AC

## 2011-09-10 MED ORDER — CEFTRIAXONE SODIUM 250 MG IJ SOLR
250.0000 mg | Freq: Once | INTRAMUSCULAR | Status: DC
  Filled 2011-09-10: qty 250

## 2011-09-10 MED ORDER — FLUCONAZOLE 150 MG PO TABS
150.0000 mg | ORAL_TABLET | Freq: Once | ORAL | Status: AC
  Administered 2011-09-10: 150 mg via ORAL
  Filled 2011-09-10: qty 1

## 2011-09-10 MED ORDER — METRONIDAZOLE 500 MG PO TABS: 500.0000 mg | ORAL_TABLET | Freq: Two times a day (BID) | ORAL | Status: DC

## 2011-09-10 MED ORDER — FLUCONAZOLE 150 MG PO TABS: 150.0000 mg | ORAL_TABLET | Freq: Once | ORAL | Status: DC

## 2011-09-10 MED ORDER — METRONIDAZOLE 500 MG PO TABS
500.0000 mg | ORAL_TABLET | Freq: Two times a day (BID) | ORAL | Status: DC
  Administered 2011-09-10 – 2011-09-13 (×7): 500 mg via ORAL
  Filled 2011-09-10 (×9): qty 1

## 2011-09-10 MED ORDER — CEFIXIME 400 MG PO TABS
400.0000 mg | ORAL_TABLET | Freq: Once | ORAL | Status: AC
  Administered 2011-09-10: 400 mg via ORAL
  Filled 2011-09-10: qty 1

## 2011-09-10 NOTE — Progress Notes (Signed)
Pt. Returned from [facility] hospital -tolerated well. Is presently being treated for vaginal infections. Re[facility] . Pt stated her BF came to visit at lunchtime.

## 2011-09-10 NOTE — MAU Note (Signed)
Patient refused Rocephin injection, informed patient will be transferred back to [facility], patient upset that we are not giving her anything pain.
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
## 2011-09-10 NOTE — Progress Notes (Signed)
  [name] is a 26 y.o. female [id number] 1985/07/26  09/07/2011 Principal Problem:  *Opioid dependence   Mental Status:    Subjective/Objective: C/o pelvic pain and vaginal discharge. [facility] to  [facility] and found to have Candida cervicitis and probable PID. Empirically treated for Chlamydya gonorrhea and bacterial vaginitis.  Refused Rocephin so she was given Zithromax Diflucan and Flagyl.     Filed Vitals:   09/10/11 1015  BP: 112/56  Pulse: 72  Temp: 97.8 F (36.6 C)  Resp:     Lab Results:   BMET    Component Value Date/Time   NA 138 09/06/2011 1930   K 4.0 09/06/2011 1930   CL 106 09/06/2011 1930   CO2 22 09/06/2011 1930   GLUCOSE 120* 09/06/2011 1930   BUN 13 09/06/2011 1930   CREATININE 0.82 09/06/2011 1930   CALCIUM 8.9 09/06/2011 1930   GFRNONAA >90 09/06/2011 1930   GFRAA >90 09/06/2011 1930    Medications:  Scheduled:     . azithromycin  1,000 mg Oral Daily  . cefixime  400 mg Oral Once  . chlordiazePOXIDE  25 mg Oral QID   Followed by  . chlordiazePOXIDE  25 mg Oral TID   Followed by  . chlordiazePOXIDE  25 mg Oral BH-qamhs   Followed by  . chlordiazePOXIDE  25 mg Oral Daily  . cloNIDine  0.1 mg Oral QID   Followed by  . cloNIDine  0.1 mg Oral BH-qamhs   Followed by  . cloNIDine  0.1 mg Oral QAC breakfast  . fluconazole  150 mg Oral Once  . fluconazole  150 mg Oral Once  . fluconazole  150 mg Oral Once  . metroNIDAZOLE  500 mg Oral BID  . mulitivitamin with minerals  1 tablet Oral Daily  . neomycin-bacitracin-polymyxin   Topical Daily  . nicotine  21 mg Transdermal Q0600  . DISCONTD: cefTRIAXone (ROCEPHIN) IM  250 mg Intramuscular Once     PRN Meds acetaminophen, alum & mag hydroxide-simeth, dicyclomine, loperamide, magnesium hydroxide, methocarbamol, ondansetron Plan: continue Diflucan and Flagyl as ordered from [facility]            No OPIATES as she is here for detox.            Continue current plan of care .  Maryanna Stuber,MICKIE  D. 09/10/2011

## 2011-09-10 NOTE — Progress Notes (Signed)
Found pt in bed at 2030 complaining of abdominal cramping and anxiety. Refuses group. Asking for ice cream, told pt snacks would be after group to which pt replied, "I can't get up." Medicated with bentyl with relief. At 2130 pt up at med window complaining about medications. "I'm being promised things but I'm just being lied to." Reviewed protocols with pt and available meds at this time. Pt remains irritable, loud, labile. She does deny SI/HI/AVH. Lawrence Marseilles

## 2011-09-10 NOTE — Progress Notes (Signed)
Phoned [facility] and spoke to [facility]. Will be taken by security to women's for evaluation. Dorothyann Peng, RN will accompany pt.

## 2011-09-10 NOTE — MAU Note (Signed)
Patient was transferred from  [facility] for further evaluation of pelvic pain and vaginal discharge brown with an odor.
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
## 2011-09-11 MED ORDER — HYDROXYZINE HCL 50 MG PO TABS
50.0000 mg | ORAL_TABLET | Freq: Every evening | ORAL | Status: DC | PRN
  Administered 2011-09-11 – 2011-09-12 (×2): 50 mg via ORAL
  Filled 2011-09-11 (×6): qty 1

## 2011-09-11 MED ORDER — FLUCONAZOLE 150 MG PO TABS
150.0000 mg | ORAL_TABLET | Freq: Once | ORAL | Status: AC
  Administered 2011-09-13: 150 mg via ORAL
  Filled 2011-09-11: qty 1

## 2011-09-11 NOTE — Progress Notes (Signed)
Pt. Came to the med window and stated she felt better. Appeared very tired. Stated she was having a little pelivc pain but refused any pain med when offered. Pt. Does appear dishelved this am. No Si or HI and contracts for safety.

## 2011-09-11 NOTE — Progress Notes (Signed)
Pt remains irritable, cursing staff, somatic complaints. "I'm having an aneurysm." When asked if her withdrawal symptoms are worse pt confirms they are but in her next statement is angry a discharge date has not been discussed. Tylenol given for headache and ice pack provided. Encouraged pt to continue resting in bed however pt up in dayroom getting coffee and complaining about her care. [name]

## 2011-09-11 NOTE — Progress Notes (Signed)
Patient ID: [name], female   DOB: [birth year], 26 y.o.   MRN: [id number]  [facility] Group Notes:  (Counselor/Nursing/MHT/Case Management/Adjunct)  09/11/2011 1:15 PM  Type of Therapy:  Group Therapy, Dance/Movement Therapy   Participation Level:  Active  Participation Quality:  Appropriate  Affect:  Appropriate  Cognitive:  Appropriate  Insight:  Limited  Engagement in Group:  Good  Engagement in Therapy:  Good  Modes of Intervention:  Clarification, Problem-solving, Role-play, Socialization and Support  Summary of Progress/Problems: Therapist discussed choices and reasons why we choose good or bad choices in life.  Therapist ask members of group to read aloud the "Autobiography in Five Short Chapters" and ask group members where are they currently in relation to the poem.  Pt. stated, "I really like the poem.  This poem is my life.  I'm tired of being on crack and ready to make some changes in my life. Pt. seemed to understand where she fits in the poem.   [name]

## 2011-09-11 NOTE — Progress Notes (Signed)
Patient ID: [name], female   DOB: 1985-05-06, 26 y.o.   MRN: [id number]  Pt. attended a.m. aftercare planning group. Pt. was visibly upset and agitated. Pt. stated that she felt confused and pacified. Pt. is anxious to be discharged so that she can begin work on Wednesday and said, "I'm a nice person but I can be nasty." Pt. had an emotional break-down during group and spoke of the remorse and regret she feels about her drug-use as well as the type of mother she has been for her daughter. Pt. stated that her drug-use is generational and she wants to break the chain.

## 2011-09-11 NOTE — Progress Notes (Signed)
Dressing on rt knee changed non foul smelling exudate yellow/green in color. Does not appear to have water on the knee but states kee is painful at times.

## 2011-09-11 NOTE — Progress Notes (Signed)
Pt. States she wants to get ready for her discharge and wants to talk more about her feelings. Her hopelessness and depression today are b/t a 1-2. Pt. Refused any medicine for any pain even though she has a slight headache. Dressings to be changed.

## 2011-09-11 NOTE — Progress Notes (Signed)
  [name] is a 26 y.o. female [id number] [birth year]  09/07/2011 Principal Problem:  *Opioid dependence   Mental Status: Mood is much calmer and brighter. Denies SI/HI/AVH.    Subjective/Objective:  Feels better asks about Seroquel or Depakote for sleep but then says never mind. 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   Filed Vitals:   09/11/11 0801  BP: 111/75  Pulse: 88  Temp:   Resp:     Lab Results:   BMET    Component Value Date/Time   NA 138 09/06/2011 1930   K 4.0 09/06/2011 1930   CL 106 09/06/2011 1930   CO2 22 09/06/2011 1930   GLUCOSE 120* 09/06/2011 1930   BUN 13 09/06/2011 1930   CREATININE 0.82 09/06/2011 1930   CALCIUM 8.9 09/06/2011 1930   GFRNONAA >90 09/06/2011 1930   GFRAA >90 09/06/2011 1930    Medications:  Scheduled:     . chlordiazePOXIDE  25 mg Oral BH-qamhs   Followed by  . chlordiazePOXIDE  25 mg Oral Daily  . cloNIDine  0.1 mg Oral QID   Followed by  . cloNIDine  0.1 mg Oral BH-qamhs   Followed by  . cloNIDine  0.1 mg Oral QAC breakfast  . fluconazole  150 mg Oral Once  . metroNIDAZOLE  500 mg Oral BID  . mulitivitamin with minerals  1 tablet Oral Daily  . neomycin-bacitracin-polymyxin   Topical Daily  . nicotine  21 mg Transdermal Q0600  . DISCONTD: azithromycin  1,000 mg Oral Daily  . DISCONTD: fluconazole  150 mg Oral Once     PRN Meds acetaminophen, alum & mag hydroxide-simeth, dicyclomine, loperamide, magnesium hydroxide, methocarbamol, ondansetron Plan: allow Vistaril to help with sleep   Sabatino Williard,MICKIE D. 09/11/2011

## 2011-09-12 MED ORDER — DIVALPROEX SODIUM ER 500 MG PO TB24
500.0000 mg | ORAL_TABLET | Freq: Every day | ORAL | Status: DC
  Administered 2011-09-12 – 2011-09-13 (×2): 500 mg via ORAL
  Filled 2011-09-12 (×4): qty 1

## 2011-09-12 NOTE — Progress Notes (Signed)
Patient ID: [name], female   DOB: [birth year], 26 y.o.   MRN: [id number]  She had had angry outbursts today when she felt like she was not getting the medication for mood swings. Medication had not been ordered at that time and when she did received the medication she went to  Bed and slept/ Was up for 5 pm med,s and meals and has been calm and cooperative and pleasant.

## 2011-09-12 NOTE — Progress Notes (Signed)
B[facility] Group Notes:  (Counselor/Nursing/MHT/Case Management/Adjunct)  09/12/2011 12:40 PM  Type of Therapy:  Group Therapy at 11:00  Participation Level:  None  Participation Quality:  Inattentive and Resistant  Affect:  Irritable  Cognitive:  Alert  Insight:  None  Engagement in Group:  None  Engagement in Therapy:  None  Modes of Intervention:  Limit-setting  Summary of Progress/Problems:Patient was in group room for ten minutes of counseling group; moved twice between chairs and ultimately left when writer asked her to sit up in chair vs lying down.    Clide Dales 09/12/2011, 12:45 PM

## 2011-09-12 NOTE — Treatment Plan (Signed)
Interdisciplinary Treatment Plan Update (Adult)  Date: 09/12/2011  Time Reviewed: 5:55 PM   Progress in Treatment: Attending groups: Yes Participating in groups: Yes Taking medication as prescribed: Yes Tolerating medication: Yes   Family/Significant other contact made:  Not today.  Was told by Dr she must allow [place] to call to confirm safe place to stay at D/C if not going directly into tx from here Patient understands diagnosis:  Yes in a limited way.  Poor insight.  Knows she gets angry easily.  More focused on getting a job than any kink of treatment Discussing patient identified problems/goals with staff:  Yes  See below Medical problems stabilized or resolved:  Yes Denies suicidal/homicidal ideation: Yes In tx team Issues/concerns per patient self-inventory:  Not filled out Other:  New problem(s) identified: N/A  Reason for Continuation of Hospitalization: Medication stabilization Withdrawal symptoms  Interventions implemented related to continuation of hospitalization: clonodine taper, encourage group attendance and participation,  Depakote trial  Additional comments:  Estimated length of stay:  Discharge Plan:  New goal(s): N/A  Review of initial/current patient goals per problem list:   1.  Goal(s):Safely detox from benzos, opiates  Met:  No  Target date:5/21  As evidenced ZO:[id number] done, opiates not yet  2.  Goal (s):Identify comprehensive sobreity plan  Met:  No  Target date:5/21  As evidenced [id number] is waffling between going home and following up with [facility] vs going to [facility] directly from here   3.  Goal(s):Eliminate SI  Met:  Yes  See previous tx plan  Target date:  As evidenced by:  4.  Goal(s): Stabilize mood  Met:  No  Target date:5/22  As evidenced by: Kerryann will report/demonstrate a decreased level of labile affect  Attendees: Patient:  [name] 09/12/2011 5:55 PM  Family:     Physician:  Lupe Carney 09/12/2011 5:55  PM   Nursing: Robbie Louis   09/12/2011 5:55 PM   Case Manager:  Richelle Ito, LCSW 09/12/2011 5:55 PM   Counselor:  Ronda Fairly, LCSWA 09/12/2011 5:55 PM   Other:     Other:     Other:     Other:      Scribe for Treatment Team:   Ida Rogue, 09/12/2011 5:55 PM

## 2011-09-12 NOTE — Progress Notes (Signed)
Patient ID: [name], female   DOB: [birth year], 26 y.o.   MRN: [id number] Has been out and about on hall this evening, attended group, a little flirty at times with female peers, otherwise interacting well with staff and peers.  Neosporin and lg bandaid applied to rt knee abrasion after shower, old dsg showed some purulent drainage, had encouraged her to wash site well with soap while showering.  Helps peer frequently who has mobility issues, has been pleasant, cooperative and compliant with meds.  Denies si/hi.  Will continue to monitor.

## 2011-09-12 NOTE — Progress Notes (Signed)
[facility] MD Progress Note  09/12/2011 12:51 PM  S/O: Patient seen and evaluated in treatment team. Chart reviewed. Patient stated that her mood was "not good". Her affect was mood congruent and irritable. Crying spells improved. She denied any current thoughts of self injurious behavior, suicidal ideation or homicidal ideation. There were no auditory or visual hallucinations, paranoia, delusional thought processes, or mania noted. Thought process was linear and goal directed with redirection. Sig psychomotor agitation noted. Speech was normal rate, tone and increased volume. Eye contact was poor. Judgment and insight are limited. No acute safety concerns reported from team. Pt still ambivalent regarding next steps.  Sobriety plan pending.   Sleep:  Number of Hours: 4.5    Vital Signs:Blood pressure 134/73, pulse 78, temperature 98 F (36.7 C), temperature source Oral, resp. rate 16, height 5\' 3"  (1.6 m), weight 58.514 kg (129 lb), last menstrual period 09/07/2010.  Lab Results: No results found for this or any previous visit (from the past 48 hour(s)).  Physical Findings: CIWA:  CIWA-Ar Total: 0  COWS:  COWS Total Score: 14   A/P: Heroin Dependence & W/D; Benzodiazepine W/D; Cocaine and Cannabis Dependence; Mood Disorder NOS; PD NOS with Cluster B traits  Pt in continued withdrawal with mood instability.  Pt willing to start mood stabilizer in addition to her librium and clonidine tapers for withdrawal. Initiated Depakote ER 500mg  po qd.  Medication education completed.  Pros, cons, risks, potential side effects and benefits were discussed with pt.  Pt agreeable with the plan.  See orders.  Dispo pending. Discussed with team.  Lupe Carney 09/12/2011, 12:51 PM

## 2011-09-13 MED ORDER — METRONIDAZOLE 500 MG PO TABS: 500.0000 mg | ORAL_TABLET | Freq: Two times a day (BID) | ORAL | Status: AC

## 2011-09-13 MED ORDER — DIVALPROEX SODIUM ER 500 MG PO TB24: ORAL_TABLET | ORAL | Status: DC

## 2011-09-13 MED ORDER — DIVALPROEX SODIUM ER 500 MG PO TB24
500.0000 mg | ORAL_TABLET | Freq: Every day | ORAL | Status: DC
  Filled 2011-09-13: qty 14
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
## 2011-09-13 NOTE — Progress Notes (Signed)
[facility] Case Management Discharge Plan:  Will you be returning to the same living situation after discharge: Yes,  with Aurther Loft At discharge, do you have transportation home?:Yes,  bus pass Do you have the ability to pay for your medications:Yes,  mental health referral  Interagency Information:     Release of information consent forms completed and in the chart;  Patient's signature needed at discharge.  Patient to Follow up at:  Follow-up Information    Follow up with [facility]. (the GYN clinic will contact you about an appointment)       Follow up with [facility] on 09/16/2011. (Walk in at 8 AM fro your hospital follow up appointment  This is where you will see a Dr for your medication management)    Contact information:   [address]  [place]  [336] [phone]         Patient denies SI/HI:   Yes,  yes    Safety Planning and Suicide Prevention discussed:  Yes,  yes  Barrier to discharge identified:No.  Summary and Recommendations:   [name] 09/13/2011, 12:01 PM

## 2011-09-13 NOTE — Progress Notes (Signed)
Patient up and active in the milieu this morning.  Participated in groups.  Attended treatment team to discuss discharge planning.  Presented herself well.  Has tolerated medications well today.  Affect bright.  States she feels hopeful. States Depakote is helping to control her mood swings.  Patient was discharged today to home.  Reviewed all discharge instructions, medications, and follow up care.  Verbalized understanding of all.  Patient was given a two week supply of medication from the hospital pharmacy.  All belongings retrieved from room and from the locker out front.  Patient left the unit ambulatory and was escorted to the front lobby.  She was given a bus pass to get home with.
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
## 2011-09-13 NOTE — Progress Notes (Signed)
[facility] Group Notes:  (Counselor/Nursing/MHT/Case Management/Adjunct)  09/13/2011 3:15 PM  Type of Therapy:  Group Therapy  Participation Level:  Did Not Attend   Clide Dales 09/13/2011, 3:15 PM

## 2011-09-13 NOTE — Treatment Plan (Signed)
Interdisciplinary Treatment Plan Update (Adult)  Date: 09/13/2011  Time Reviewed: 10:12 AM   Progress in Treatment: Attending groups: Yes Participating in groups: Yes Taking medication as prescribed: Yes Tolerating medication: Yes   Family/Significant othe contact made:   Patient understands diagnosis:  Yes Discussing patient identified problems/goals with staff:  Yes Medical problems stabilized or resolved:  Yes Denies suicidal/homicidal ideation: Yes  In tx team Issues/concerns per patient self-inventory:  None Other:  New problem(s) identified: N/A  Reason for Continuation of Hospitalization: Other; describe D/C today  Interventions implemented related to continuation of hospitalization:   Additional comments:  Estimated length of stay: D/C today  Discharge Plan: Stay with Aurther Loft [friend] briefly until she goes to [place]  New goal(s): N/A  Review of initial/current patient goals per problem list:   1.  Goal(s):Stabilize mood  Met:  Yes  Target date:5/21  As evidenced QI:ONGEX reports that depakote has been helpful to stabilize mood  2.  Goal (s): Eliminate SI  Met:  Yes  See previous tx plan  Target date:  As evidenced by:  3.  Goal(s): Identify comprehensive sobriety plan  Met:  Yes  Target date:5/21  As evidenced by: Today Leidi states she will go to [place] to be with family who are supportive  4.  Goal(s): Safely detox from benzos and opiates  Met:  Yes  Target date: 5/21  As evidenced by:Stable vitals and no withdrawal symptoms  Attendees: Patient:  [name] 09/13/2011 10:12 AM  Family:     Physician:  Lupe Carney 09/13/2011 10:12 AM   Nursing: Cassie Freer   09/13/2011 10:12 AM   Case Manager:  Richelle Ito, LCSW 09/13/2011 10:12 AM   Counselor:  Ronda Fairly, LCSWA 09/13/2011 10:12 AM   Other:     Other:     Other:     Other:      Scribe for Treatment Team:   Ida Rogue, 09/13/2011 10:12 AM

## 2011-09-13 NOTE — [facility] Suicide Risk Assessment (Signed)
Suicide Risk Assessment  Discharge Assessment      Demographic factors: Adolescent or young adult;Low socioeconomic status  Current Mental Status Per Nursing Assessment::   On Admission:   (Denies any self- or other-harm) At Discharge:Pt denied any SI/HI/thoughts of self harm or acute psychiatric issues in treatment team with clinical, nursing and medical team present.  Current Mental Status Per Physician: Patient seen and evaluated in treatment team. Chart reviewed. Patient stated that her mood was "much better on Depakote". Her affect was mood congruent and stable. No reported crying spells.  She denied any current thoughts of self injurious behavior, suicidal ideation or homicidal ideation. There were no auditory or visual hallucinations, paranoia, delusional thought processes, or mania noted. Thought process was linear and goal directed. Speech was normal rate, tone and volume. Pt was smiling.  Eye contact was better. Judgment and insight are limited. No acute safety concerns reported from team. Pt still ambivalent regarding next steps, yet now wants to return to a freind's with outpt Tx and potentially relocate back to [place] with family.   Loss Factors: Decrease in vocational status;Loss of significant relationship;Decline in physical health;Financial problems / change in socioeconomic status  Historical Factors: Recently on Lithium w/out reported efficacy; Requesting "Klonopin" for anxiety upon initial treatment days; wants to get clean for her child  Risk Reduction Factors: Sense of responsibility to family; daughter; willing now to take mood stabilizer  Discharge Diagnoses:  AXIS I:  Heroin Dependence; Cocaine and Cannabis Dependence; Mood Disorder NOS AXIS II: PD NOS with Cluster B traits AXIS III:   Past Medical History  Diagnosis Date  . STD (female)     hx of chlamydia and gonorrhea  . History of PID   . Endometriosis   . Glaucoma   . Ovarian cyst    AXIS IV:  Moderate AXIS  V: 50  Cognitive Features That Contribute To Risk: limited insight; impulsivity.   Suicide Risk: Pt viewed as a chronic increased risk of harm to self in light of her past hx and risk factors.  No acute safety concerns since on the unit.  Pt contracting for safety and is stable for discharge.  Plan Of Care/Follow-up recommendations: Pt seen and evaluated in treatment team. Chart reviewed.  Pt stable for and requesting discharge to friend's home, [place].  Confirmed via CM. Pt contracting for safety and does not currently meet [place] involuntary commitment criteria for continued hospitalization against her will.  Mental health treatment, medication management and continued sobriety will mitigate against the increased risk of harm to self and/or others.  Discussed the importance of recovery further with pt, as well as, tools to move forward in a healthy & safe manner.  Pt agreeable with the plan.  Discussed with the team.  Please see orders, follow up appointments per AVS [facility]) and full discharge summary to be completed by physician extender.  Recommend follow up with NA.  Diet: Regular.  Activity: As tolerated.     Lupe Carney 09/13/2011, 1:46 PM

## 2011-09-15 NOTE — Progress Notes (Signed)
Patient Discharge Instructions:  After Visit Summary (AVS):   Faxed to:  09/14/2011 Psychiatric Admission Assessment Note:   Faxed to:  09/14/2011 Suicide Risk Assessment - Discharge Assessment:   Faxed to:  09/14/2011 Faxed/Sent to the Next Level Care provider:  09/14/2011  Faxed to [facility] @ [phone]  Heloise Purpura, Eduard Clos, 09/15/2011, 4:12 PM

## 2011-09-16 ENCOUNTER — Encounter: Payer: Self-pay | Admitting: Obstetrics & Gynecology

## 2011-12-08 ENCOUNTER — Emergency Department (HOSPITAL_COMMUNITY)
Admission: EM | Admit: 2011-12-08 | Discharge: 2011-12-08 | Disposition: A | Discharge status: Home / Self Care | Payer: Self-pay | Attending: Emergency Medicine | Admitting: Emergency Medicine

## 2011-12-08 ENCOUNTER — Emergency Department (HOSPITAL_COMMUNITY): Payer: Self-pay

## 2011-12-08 ENCOUNTER — Encounter (HOSPITAL_COMMUNITY): Payer: Self-pay | Admitting: Emergency Medicine

## 2011-12-08 DIAGNOSIS — Z202 Contact with and (suspected) exposure to infections with a predominantly sexual mode of transmission: Secondary | ICD-10-CM | POA: Insufficient documentation

## 2011-12-08 DIAGNOSIS — M79609 Pain in unspecified limb: Secondary | ICD-10-CM | POA: Insufficient documentation

## 2011-12-08 DIAGNOSIS — F1910 Other psychoactive substance abuse, uncomplicated: Secondary | ICD-10-CM | POA: Insufficient documentation

## 2011-12-08 DIAGNOSIS — M79671 Pain in right foot: Secondary | ICD-10-CM

## 2011-12-08 DIAGNOSIS — F17200 Nicotine dependence, unspecified, uncomplicated: Secondary | ICD-10-CM | POA: Insufficient documentation

## 2011-12-08 DIAGNOSIS — R109 Unspecified abdominal pain: Secondary | ICD-10-CM | POA: Insufficient documentation

## 2011-12-08 DIAGNOSIS — N390 Urinary tract infection, site not specified: Secondary | ICD-10-CM | POA: Insufficient documentation

## 2011-12-08 DIAGNOSIS — M7989 Other specified soft tissue disorders: Secondary | ICD-10-CM | POA: Insufficient documentation

## 2011-12-08 LAB — URINALYSIS, ROUTINE W REFLEX MICROSCOPIC
Bilirubin Urine: NEGATIVE
Ketones, ur: NEGATIVE mg/dL
Specific Gravity, Urine: 1.02 (ref 1.005–1.030)
pH: 6 (ref 5.0–8.0)

## 2011-12-08 LAB — COMPREHENSIVE METABOLIC PANEL
ALT: 18 U/L (ref 0–35)
Albumin: 3.4 g/dL — ABNORMAL LOW (ref 3.5–5.2)
Alkaline Phosphatase: 56 U/L (ref 39–117)
Calcium: 8.9 mg/dL (ref 8.4–10.5)
GFR calc Af Amer: >90 mL/min (ref >90)
Glucose, Bld: 130 mg/dL — ABNORMAL HIGH (ref 70–99)
Potassium: 3 mEq/L — ABNORMAL LOW (ref 3.5–5.1)
Sodium: 141 mEq/L (ref 135–145)
Total Protein: 6.4 g/dL (ref 6.0–8.3)

## 2011-12-08 LAB — RAPID URINE DRUG SCREEN, HOSP PERFORMED
Barbiturates: NOT DETECTED
Cocaine: POSITIVE — AB
Opiates: NOT DETECTED
Tetrahydrocannabinol: POSITIVE — AB

## 2011-12-08 LAB — CBC WITH DIFFERENTIAL/PLATELET
Eosinophils Relative: 3 % (ref 0–5)
HCT: 37.9 % (ref 36.0–46.0)
Hemoglobin: 13.5 g/dL (ref 12.0–15.0)
Lymphocytes Relative: 14 % (ref 12–46)
MCHC: 35.6 g/dL (ref 30.0–36.0)
MCV: 85.4 fL (ref 78.0–100.0)
Monocytes Absolute: 0.6 10*3/uL (ref 0.1–1.0)
Monocytes Relative: 6 % (ref 3–12)
Neutro Abs: 6.9 10*3/uL (ref 1.7–7.7)
WBC: 9 10*3/uL (ref 4.0–10.5)

## 2011-12-08 LAB — URINE MICROSCOPIC-ADD ON

## 2011-12-08 MED ORDER — NITROFURANTOIN MONOHYD MACRO 100 MG PO CAPS: 100.0000 mg | ORAL_CAPSULE | Freq: Two times a day (BID) | ORAL | Status: AC

## 2011-12-08 MED ORDER — MORPHINE SULFATE 4 MG/ML IJ SOLN
4.0000 mg | Freq: Once | INTRAMUSCULAR | Status: DC
  Filled 2011-12-08: qty 1

## 2011-12-08 MED ORDER — POTASSIUM CHLORIDE CRYS ER 20 MEQ PO TBCR
40.0000 meq | EXTENDED_RELEASE_TABLET | Freq: Once | ORAL | Status: AC
  Administered 2011-12-08: 40 meq via ORAL
  Filled 2011-12-08: qty 2

## 2011-12-08 MED ORDER — KETOROLAC TROMETHAMINE 30 MG/ML IJ SOLN
30.0000 mg | Freq: Once | INTRAMUSCULAR | Status: AC
  Administered 2011-12-08: 30 mg via INTRAVENOUS
  Filled 2011-12-08: qty 1

## 2011-12-08 MED ORDER — HYDROCODONE-ACETAMINOPHEN 5-325 MG PO TABS: 1.0000 | ORAL_TABLET | ORAL | Status: AC | PRN

## 2011-12-08 MED ORDER — AZITHROMYCIN 1 G PO PACK
1.0000 g | PACK | Freq: Once | ORAL | Status: DC
  Filled 2011-12-08: qty 1

## 2011-12-08 MED ORDER — DEXTROSE 5 % IV SOLN
1.0000 g | Freq: Once | INTRAVENOUS | Status: AC
  Administered 2011-12-08: 1 g via INTRAVENOUS
  Filled 2011-12-08: qty 10

## 2011-12-08 MED ORDER — PHENAZOPYRIDINE HCL 200 MG PO TABS: 200.0000 mg | ORAL_TABLET | Freq: Three times a day (TID) | ORAL | Status: AC

## 2011-12-08 MED ORDER — IBUPROFEN 600 MG PO TABS: 600.0000 mg | ORAL_TABLET | Freq: Four times a day (QID) | ORAL | Status: AC | PRN

## 2011-12-08 MED ORDER — ONDANSETRON HCL 4 MG/2ML IJ SOLN
4.0000 mg | Freq: Once | INTRAMUSCULAR | Status: AC
  Administered 2011-12-08: 4 mg via INTRAVENOUS
  Filled 2011-12-08 (×2): qty 2

## 2011-12-08 MED ORDER — HYDROCODONE-ACETAMINOPHEN 5-325 MG PO TABS
2.0000 | ORAL_TABLET | Freq: Once | ORAL | Status: AC
  Administered 2011-12-08: 2 via ORAL
  Filled 2011-12-08: qty 2

## 2011-12-08 MED ORDER — HYDROCODONE-ACETAMINOPHEN 5-325 MG PO TABS
1.0000 | ORAL_TABLET | Freq: Once | ORAL | Status: AC
  Administered 2011-12-08: 1 via ORAL
  Filled 2011-12-08: qty 1

## 2011-12-08 MED ORDER — CEFTRIAXONE SODIUM 250 MG IJ SOLR
250.0000 mg | Freq: Once | INTRAMUSCULAR | Status: AC
  Administered 2011-12-08: 250 mg via INTRAMUSCULAR
  Filled 2011-12-08: qty 250
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
## 2011-12-08 NOTE — ED Notes (Signed)
Patient transported to X-ray 

## 2011-12-08 NOTE — ED Notes (Signed)
Junious Dresser SANE nurse coming for eval.

## 2011-12-08 NOTE — ED Provider Notes (Signed)
Patient signed out to me by Dr. [name]  Patient has been seen by social work. Patients urinary tract infection to be treated. And she has been given referrals  Toy Baker, MD 12/08/11 2209
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
## 2011-12-08 NOTE — ED Notes (Signed)
Social work in to talk with pt.

## 2011-12-08 NOTE — ED Notes (Addendum)
SANE nurse at bedside.
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
## 2011-12-08 NOTE — ED Notes (Signed)
Pt in bed w/o any s/s of distress. Will continue to monitor.

## 2012-02-15 ENCOUNTER — Emergency Department (HOSPITAL_COMMUNITY): Admission: EM | Admit: 2012-02-15 | Discharge: 2012-02-15 | Discharge status: Against Medical Advice | Payer: Self-pay

## 2012-02-15 NOTE — ED Notes (Signed)
Pt was called 2 separate times.  No response.

## 2012-03-26 ENCOUNTER — Emergency Department (HOSPITAL_COMMUNITY)
Admission: EM | Admit: 2012-03-26 | Discharge: 2012-03-26 | Disposition: A | Discharge status: Home / Self Care | Payer: Self-pay | Attending: Emergency Medicine | Admitting: Emergency Medicine

## 2012-03-26 ENCOUNTER — Encounter (HOSPITAL_COMMUNITY): Payer: Self-pay | Admitting: Emergency Medicine

## 2012-03-26 DIAGNOSIS — R350 Frequency of micturition: Secondary | ICD-10-CM

## 2012-03-26 DIAGNOSIS — N949 Unspecified condition associated with female genital organs and menstrual cycle: Secondary | ICD-10-CM | POA: Insufficient documentation

## 2012-03-26 DIAGNOSIS — H409 Unspecified glaucoma: Secondary | ICD-10-CM | POA: Insufficient documentation

## 2012-03-26 DIAGNOSIS — R300 Dysuria: Secondary | ICD-10-CM | POA: Insufficient documentation

## 2012-03-26 DIAGNOSIS — Z8619 Personal history of other infectious and parasitic diseases: Secondary | ICD-10-CM | POA: Insufficient documentation

## 2012-03-26 DIAGNOSIS — Z8742 Personal history of other diseases of the female genital tract: Secondary | ICD-10-CM | POA: Insufficient documentation

## 2012-03-26 DIAGNOSIS — R634 Abnormal weight loss: Secondary | ICD-10-CM | POA: Insufficient documentation

## 2012-03-26 DIAGNOSIS — Z3202 Encounter for pregnancy test, result negative: Secondary | ICD-10-CM | POA: Insufficient documentation

## 2012-03-26 DIAGNOSIS — R358 Other polyuria: Secondary | ICD-10-CM | POA: Insufficient documentation

## 2012-03-26 DIAGNOSIS — F17200 Nicotine dependence, unspecified, uncomplicated: Secondary | ICD-10-CM | POA: Insufficient documentation

## 2012-03-26 DIAGNOSIS — R3589 Other polyuria: Secondary | ICD-10-CM | POA: Insufficient documentation

## 2012-03-26 DIAGNOSIS — Z8781 Personal history of (healed) traumatic fracture: Secondary | ICD-10-CM | POA: Insufficient documentation

## 2012-03-26 DIAGNOSIS — M549 Dorsalgia, unspecified: Secondary | ICD-10-CM | POA: Insufficient documentation

## 2012-03-26 DIAGNOSIS — R10A0 Flank pain, unspecified side: Secondary | ICD-10-CM

## 2012-03-26 DIAGNOSIS — R109 Unspecified abdominal pain: Secondary | ICD-10-CM | POA: Insufficient documentation

## 2012-03-26 LAB — WET PREP, GENITAL
Clue Cells Wet Prep HPF POC: NONE SEEN
Trich, Wet Prep: NONE SEEN
WBC, Wet Prep HPF POC: NONE SEEN
Yeast Wet Prep HPF POC: NONE SEEN

## 2012-03-26 LAB — URINE MICROSCOPIC-ADD ON

## 2012-03-26 LAB — URINALYSIS, ROUTINE W REFLEX MICROSCOPIC
Glucose, UA: NEGATIVE mg/dL
Ketones, ur: NEGATIVE mg/dL
Protein, ur: NEGATIVE mg/dL
Urobilinogen, UA: 0.2 mg/dL (ref 0.0–1.0)

## 2012-03-26 LAB — POCT I-STAT, CHEM 8
HCT: 43 % (ref 36.0–46.0)
Hemoglobin: 14.6 g/dL (ref 12.0–15.0)
Potassium: 4.2 mEq/L (ref 3.5–5.1)
Sodium: 138 mEq/L (ref 135–145)
TCO2: 23 mmol/L (ref 0–100)

## 2012-03-26 LAB — GC/CHLAMYDIA PROBE AMP: CT Probe RNA: NEGATIVE

## 2012-03-26 MED ORDER — IBUPROFEN 200 MG PO TABS: 600.0000 mg | ORAL_TABLET | Freq: Three times a day (TID) | ORAL | Status: DC | PRN

## 2012-03-26 MED ORDER — OXYCODONE-ACETAMINOPHEN 5-325 MG PO TABS: 1.0000 | ORAL_TABLET | Freq: Four times a day (QID) | ORAL | Status: DC | PRN

## 2012-03-26 MED ORDER — OXYCODONE-ACETAMINOPHEN 5-325 MG PO TABS
1.0000 | ORAL_TABLET | Freq: Once | ORAL | Status: AC
  Administered 2012-03-26: 1 via ORAL
  Filled 2012-03-26: qty 1

## 2012-03-26 NOTE — ED Notes (Addendum)
Patient states she has dysuria and polyuria, as well as left flank pain.  Patient reports N/V/D and denies fevers.

## 2012-03-26 NOTE — ED Provider Notes (Signed)
History     CSN: [id number]  Arrival date & time 03/26/12  0406   First MD Initiated Contact with Patient 03/26/12 0411      Chief Complaint  Patient presents with  . Flank Pain    left    (Consider location/radiation/quality/duration/timing/severity/associated sxs/prior treatment) HPI Comments: L flank pain dysuria, polyuria weight loss.  Hx endometrioses, irregular menses, L ovarian cysts, low back pain.    Patient is a 26 y.o. female presenting with flank pain. The history is provided by the patient.  Flank Pain This is a recurrent problem. The current episode started more than 1 month ago. The problem occurs intermittently. The problem has been unchanged. Associated symptoms include abdominal pain. Pertinent negatives include no change in bowel habit, fever, headaches, myalgias, rash or weakness.    Past Medical History  Diagnosis Date  . STD (female)     hx of chlamydia and gonorrhea  . History of PID   . Endometriosis   . Glaucoma(365)   . Ovarian cyst     Past Surgical History  Procedure Date  . Fracture surgery     left leg  . Dilation and curettage of uterus   . Laparoscopy     Family History  Problem Relation Age of Onset  . Anesthesia problems Neg Hx   . Hypotension Neg Hx   . Malignant hyperthermia Neg Hx   . Pseudochol deficiency Neg Hx   . Alcohol abuse Neg Hx     History  Substance Use Topics  . Smoking status: Current Every Day Smoker -- 0.5 packs/day    Types: Cigarettes  . Smokeless tobacco: Not on file  . Alcohol Use: No     Comment: Denies ETOH use    OB History    Grav Para Term Preterm Abortions TAB SAB Ect Mult Living   3 1 1  0 2 0 2 0 0 1      Review of Systems  Constitutional: Positive for unexpected weight change. Negative for fever, activity change and appetite change.  HENT: Negative.   Eyes: Negative.   Respiratory: Negative for shortness of breath.   Cardiovascular: Negative.   Gastrointestinal: Positive for  abdominal pain. Negative for change in bowel habit.  Genitourinary: Positive for frequency, flank pain and vaginal pain. Negative for dysuria, decreased urine volume, vaginal bleeding and vaginal discharge.  Musculoskeletal: Positive for back pain. Negative for myalgias.  Skin: Negative for rash and wound.  Neurological: Negative for dizziness, weakness and headaches.    Allergies  Darvocet; Doxycycline; Flexeril; Metoclopramide; Naproxen; Latex; and Tramadol  Home Medications   Current Outpatient Rx  Name  Route  Sig  Dispense  Refill  . IBUPROFEN 200 MG PO TABS   Oral   Take 3 tablets (600 mg total) by mouth every 8 (eight) hours as needed. For pain.   30 tablet   0   . OXYCODONE-ACETAMINOPHEN 5-325 MG PO TABS   Oral   Take 1 tablet by mouth every 6 (six) hours as needed for pain.   9 tablet   0     BP 129/79  Pulse 65  Temp 98.7 F (37.1 C)  Resp 20  Ht 5\' 4"  (1.626 m)  Wt 145 lb (65.772 kg)  BMI 24.89 kg/m2  SpO2 100%  LMP 03/18/2012  Physical Exam  Constitutional: She is oriented to person, place, and time. She appears well-developed and well-nourished.  HENT:  Head: Normocephalic.  Eyes: Pupils are equal, round, and reactive to light.  Neck: Normal range of motion.  Cardiovascular: Normal rate.   Pulmonary/Chest: Effort normal.  Abdominal: Soft. Bowel sounds are normal. Hernia confirmed negative in the right inguinal area and confirmed negative in the left inguinal area.  Genitourinary: Vagina normal. No erythema, tenderness or bleeding around the vagina. No foreign body around the vagina. No vaginal discharge found.       Slightly prolapsed into the vaginal  Musculoskeletal: Normal range of motion.  Lymphadenopathy:       Right: No inguinal adenopathy present.       Left: No inguinal adenopathy present.  Neurological: She is alert and oriented to person, place, and time.  Skin: Skin is warm and dry. No rash noted. No pallor.    ED Course  Procedures  (including critical care time)  Labs Reviewed  URINALYSIS, ROUTINE W REFLEX MICROSCOPIC - Abnormal; Notable for the following:    APPearance CLOUDY (*)     Hgb urine dipstick TRACE (*)     All other components within normal limits  WET PREP, GENITAL  POCT I-STAT, CHEM 8  URINE MICROSCOPIC-ADD ON  GC/CHLAMYDIA PROBE AMP  RPR  PREGNANCY, URINE   No results found.   1. Flank pain   2. Frequency of urination       MDM   Wet prep, reviewed.  Pregnancy is negative, I feel that her frequency.  Is most likely from a slightly prolapsed uterus in today        Arman Filter, NP 03/26/12 2130  Arman Filter, NP 03/26/12 [phone]

## 2012-03-26 NOTE — ED Notes (Signed)
Discharge instructions reviewed. All questions answered. Rx given x2.  

## 2012-03-26 NOTE — ED Notes (Signed)
Patient states she believes has a pinched nerve and a kidney infection. Patient reports painful and frequent urination and pain on the left side. Patient states she has had these symptoms before, this current episode has last approx 3 weeks. Patient states she has seen a pain management specialist in the past 2010 for the pinched nerve and was treated with narcotics.

## 2012-03-26 NOTE — ED Provider Notes (Signed)
Medical screening examination/treatment/procedure(s) were performed by non-physician practitioner and as supervising physician I was immediately available for consultation/collaboration.  Tobin Chad, MD 03/26/12 [phone]

## 2012-03-30 ENCOUNTER — Emergency Department (HOSPITAL_COMMUNITY)
Admission: EM | Admit: 2012-03-30 | Discharge: 2012-03-30 | Disposition: A | Discharge status: Home / Self Care | Payer: Self-pay | Attending: Emergency Medicine | Admitting: Emergency Medicine

## 2012-03-30 ENCOUNTER — Emergency Department (HOSPITAL_COMMUNITY): Payer: Self-pay

## 2012-03-30 ENCOUNTER — Encounter (HOSPITAL_COMMUNITY): Payer: Self-pay | Admitting: *Deleted

## 2012-03-30 DIAGNOSIS — H409 Unspecified glaucoma: Secondary | ICD-10-CM | POA: Insufficient documentation

## 2012-03-30 DIAGNOSIS — Z8742 Personal history of other diseases of the female genital tract: Secondary | ICD-10-CM | POA: Insufficient documentation

## 2012-03-30 DIAGNOSIS — M25559 Pain in unspecified hip: Secondary | ICD-10-CM

## 2012-03-30 DIAGNOSIS — IMO0002 Reserved for concepts with insufficient information to code with codable children: Secondary | ICD-10-CM | POA: Insufficient documentation

## 2012-03-30 DIAGNOSIS — F17200 Nicotine dependence, unspecified, uncomplicated: Secondary | ICD-10-CM | POA: Insufficient documentation

## 2012-03-30 DIAGNOSIS — S8990XA Unspecified injury of unspecified lower leg, initial encounter: Secondary | ICD-10-CM | POA: Insufficient documentation

## 2012-03-30 DIAGNOSIS — Z8619 Personal history of other infectious and parasitic diseases: Secondary | ICD-10-CM | POA: Insufficient documentation

## 2012-03-30 DIAGNOSIS — Z9889 Other specified postprocedural states: Secondary | ICD-10-CM | POA: Insufficient documentation

## 2012-03-30 DIAGNOSIS — S79929A Unspecified injury of unspecified thigh, initial encounter: Secondary | ICD-10-CM | POA: Insufficient documentation

## 2012-03-30 DIAGNOSIS — S79919A Unspecified injury of unspecified hip, initial encounter: Secondary | ICD-10-CM | POA: Insufficient documentation

## 2012-03-30 MED ORDER — HYDROCODONE-ACETAMINOPHEN 5-325 MG PO TABS
1.0000 | ORAL_TABLET | Freq: Once | ORAL | Status: AC
  Administered 2012-03-30: 1 via ORAL

## 2012-03-30 MED ORDER — OXYCODONE-ACETAMINOPHEN 5-325 MG PO TABS: 1.0000 | ORAL_TABLET | Freq: Four times a day (QID) | ORAL | Status: DC | PRN

## 2012-03-30 MED ORDER — HYDROCODONE-ACETAMINOPHEN 5-325 MG PO TABS
ORAL_TABLET | ORAL | Status: AC
  Filled 2012-03-30: qty 1

## 2012-03-30 MED ORDER — HYDROCODONE-ACETAMINOPHEN 5-325 MG PO TABS
1.0000 | ORAL_TABLET | Freq: Once | ORAL | Status: AC
  Administered 2012-03-30: 1 via ORAL
  Filled 2012-03-30: qty 1
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
## 2012-04-03 NOTE — ED Provider Notes (Signed)
Medical screening examination/treatment/procedure(s) were performed by non-physician practitioner and as supervising physician I was immediately available for consultation/collaboration.   Jenia Klepper E Hyla Coard, MD 04/03/12 1101 

## 2012-04-15 ENCOUNTER — Emergency Department (HOSPITAL_COMMUNITY)
Admission: EM | Admit: 2012-04-15 | Discharge: 2012-04-15 | Disposition: A | Discharge status: Home / Self Care | Payer: Self-pay | Attending: Emergency Medicine | Admitting: Emergency Medicine

## 2012-04-15 ENCOUNTER — Encounter (HOSPITAL_COMMUNITY): Payer: Self-pay | Admitting: Emergency Medicine

## 2012-04-15 DIAGNOSIS — Z8742 Personal history of other diseases of the female genital tract: Secondary | ICD-10-CM | POA: Insufficient documentation

## 2012-04-15 DIAGNOSIS — N898 Other specified noninflammatory disorders of vagina: Secondary | ICD-10-CM | POA: Insufficient documentation

## 2012-04-15 DIAGNOSIS — N939 Abnormal uterine and vaginal bleeding, unspecified: Secondary | ICD-10-CM

## 2012-04-15 DIAGNOSIS — R102 Pelvic and perineal pain: Secondary | ICD-10-CM

## 2012-04-15 DIAGNOSIS — Z8619 Personal history of other infectious and parasitic diseases: Secondary | ICD-10-CM | POA: Insufficient documentation

## 2012-04-15 DIAGNOSIS — Z8669 Personal history of other diseases of the nervous system and sense organs: Secondary | ICD-10-CM | POA: Insufficient documentation

## 2012-04-15 DIAGNOSIS — N949 Unspecified condition associated with female genital organs and menstrual cycle: Secondary | ICD-10-CM | POA: Insufficient documentation

## 2012-04-15 DIAGNOSIS — F17200 Nicotine dependence, unspecified, uncomplicated: Secondary | ICD-10-CM | POA: Insufficient documentation

## 2012-04-15 LAB — CBC
Hemoglobin: 12.6 g/dL (ref 12.0–15.0)
MCH: 29.6 pg (ref 26.0–34.0)
MCHC: 34.3 g/dL (ref 30.0–36.0)
Platelets: 471 10*3/uL — ABNORMAL HIGH (ref 150–400)
RBC: 4.25 MIL/uL (ref 3.87–5.11)

## 2012-04-15 LAB — BASIC METABOLIC PANEL
Calcium: 8.9 mg/dL (ref 8.4–10.5)
GFR calc non Af Amer: >90 mL/min (ref >90)
Glucose, Bld: 82 mg/dL (ref 70–99)
Potassium: 3.9 mEq/L (ref 3.5–5.1)
Sodium: 137 mEq/L (ref 135–145)

## 2012-04-15 LAB — WET PREP, GENITAL: Trich, Wet Prep: NONE SEEN

## 2012-04-15 LAB — HCG, SERUM, QUALITATIVE: Preg, Serum: NEGATIVE

## 2012-04-15 MED ORDER — IBUPROFEN 600 MG PO TABS: 600.0000 mg | ORAL_TABLET | Freq: Three times a day (TID) | ORAL | Status: DC | PRN

## 2012-04-15 MED ORDER — OXYCODONE-ACETAMINOPHEN 5-325 MG PO TABS
2.0000 | ORAL_TABLET | Freq: Once | ORAL | Status: AC
  Administered 2012-04-15: 2 via ORAL
  Filled 2012-04-15: qty 2

## 2012-04-15 MED ORDER — HYDROCODONE-ACETAMINOPHEN 7.5-500 MG/15ML PO SOLN: 10.0000 mL | Freq: Four times a day (QID) | ORAL | Status: DC | PRN

## 2012-04-15 NOTE — ED Notes (Signed)
Pt presents to the Ed with a complaint of vaginal bleeding. Pt states she has a dx of a prolapsed uterus and endometriosis.  Pt states" I have been going through 4-5 overnight pads a day."  Pt states" I am having lower back pain and I just can't sleep or do anything."

## 2012-04-15 NOTE — ED Notes (Signed)
Pt states "I was seen here on 12/2 and was diagnosed with a prolapsed uterus. They told me it would cause pain and heavy bleeding. I am tired of the pain." Pt was referred to the OB-GYN but has not followed up as advised on last ER visit; pt reports that she has had vaginal bleeding for 7 days but states "I am not bleeding that bad right now. I want something for pain"; Pt reports that she is changing her pad every 3-5 hrs with current menstrual flow; RN asked if pt could provide a urine sample; pt states "I don't have to pee and they did one last time." Pt states "I just want something for pain"

## 2012-04-15 NOTE — ED Provider Notes (Signed)
History     CSN: [id number]  Arrival date & time 04/15/12  0446   First MD Initiated Contact with Patient 04/15/12 [phone]      Chief Complaint  Patient presents with  . Vaginal Bleeding    The history is provided by the patient and medical records.   patient reports a history of endometriosis and presents the emergency department today because of several days of vaginal bleeding.  She does not have normal menstrual cycles.  She's been going to 4-5 pads a day.  She's also having mild low back pain and low pelvic pain.  She states she is tired of dealing with her ongoing pain.  She's supposed to followup with the OB/GYN but has not yet scheduled appointment.  She denies fevers or chills.  No new vaginal discharge.  No urinary complaints.  No nausea vomiting or diarrhea.  Nothing worsens her pain.  Nothing improves her pain.  Her pain is mild to moderate in severity.  Past Medical History  Diagnosis Date  . STD (female)     hx of chlamydia and gonorrhea  . History of PID   . Endometriosis   . Glaucoma(365)   . Ovarian cyst     Past Surgical History  Procedure Date  . Fracture surgery     left leg  . Dilation and curettage of uterus   . Laparoscopy     Family History  Problem Relation Age of Onset  . Anesthesia problems Neg Hx   . Hypotension Neg Hx   . Malignant hyperthermia Neg Hx   . Pseudochol deficiency Neg Hx   . Alcohol abuse Neg Hx     History  Substance Use Topics  . Smoking status: Current Every Day Smoker -- 0.5 packs/day    Types: Cigarettes  . Smokeless tobacco: Not on file  . Alcohol Use: No     Comment: Denies ETOH use    OB History    Grav Para Term Preterm Abortions TAB SAB Ect Mult Living   3 1 1  0 2 0 2 0 0 1      Review of Systems  Genitourinary: Positive for vaginal bleeding.  All other systems reviewed and are negative.    Allergies  Darvocet; Doxycycline; Flexeril; Metoclopramide; Naproxen; Latex; and Tramadol  Home Medications    Current Outpatient Rx  Name  Route  Sig  Dispense  Refill  . IBUPROFEN 200 MG PO TABS   Oral   Take 600 mg by mouth every 8 (eight) hours as needed. For pain.         . OXYCODONE-ACETAMINOPHEN 5-325 MG PO TABS   Oral   Take 1 tablet by mouth every 6 (six) hours as needed for pain.   9 tablet   0   . HYDROCODONE-ACETAMINOPHEN 7.5-500 MG/15ML PO SOLN   Oral   Take 10 mLs by mouth every 6 (six) hours as needed for pain.   60 mL   0   . IBUPROFEN 600 MG PO TABS   Oral   Take 1 tablet (600 mg total) by mouth every 8 (eight) hours as needed for pain.   15 tablet   0     BP 120/83  Pulse 67  Temp 98.8 F (37.1 C) (Oral)  Resp 16  SpO2 100%  LMP 03/18/2012  Physical Exam  Nursing note and vitals reviewed. Constitutional: She is oriented to person, place, and time. She appears well-developed and well-nourished. No distress.  HENT:  Head: Normocephalic  and atraumatic.  Eyes: EOM are normal.  Neck: Normal range of motion.  Cardiovascular: Normal rate, regular rhythm and normal heart sounds.   Pulmonary/Chest: Effort normal and breath sounds normal.  Abdominal: Soft. She exhibits no distension. There is no tenderness.  Genitourinary:       Normal external genitalia.  Normal-appearing cervix.  No cervical motion tenderness.  No adnexal masses or fullness.  No vaginal discharge.  Small amount of vaginal bleeding noted from her cervix.  Musculoskeletal: Normal range of motion.  Neurological: She is alert and oriented to person, place, and time.  Skin: Skin is warm and dry.  Psychiatric: She has a normal mood and affect. Judgment normal.    ED Course  Procedures (including critical care time)  Labs Reviewed  CBC - Abnormal; Notable for the following:    Platelets 471 (*)     All other components within normal limits  BASIC METABOLIC PANEL  HCG, SERUM, QUALITATIVE  PREGNANCY, URINE  GC/CHLAMYDIA PROBE AMP  WET PREP, GENITAL   No results found.   1. Vaginal  bleeding   2. Pelvic pain       MDM  Patient presents with what appears to be dysfunctional uterine bleeding.  She also appears to be developing chronic pelvic pain.  Home with a very short course of pain medicine as well as GYN followup        Lyanne Co, MD 04/15/12 [phone]

## 2012-04-17 ENCOUNTER — Encounter (HOSPITAL_COMMUNITY): Payer: Self-pay | Admitting: *Deleted

## 2012-04-17 ENCOUNTER — Inpatient Hospital Stay (HOSPITAL_COMMUNITY)
Admission: AD | Admit: 2012-04-17 | Discharge: 2012-04-17 | Disposition: A | Discharge status: Home / Self Care | Payer: Self-pay | Source: Ambulatory Visit | Attending: Obstetrics & Gynecology | Admitting: Obstetrics & Gynecology

## 2012-04-17 DIAGNOSIS — N730 Acute parametritis and pelvic cellulitis: Secondary | ICD-10-CM | POA: Insufficient documentation

## 2012-04-17 DIAGNOSIS — R102 Pelvic and perineal pain: Secondary | ICD-10-CM

## 2012-04-17 DIAGNOSIS — N949 Unspecified condition associated with female genital organs and menstrual cycle: Secondary | ICD-10-CM | POA: Insufficient documentation

## 2012-04-17 LAB — URINALYSIS, ROUTINE W REFLEX MICROSCOPIC
Bilirubin Urine: NEGATIVE
Glucose, UA: NEGATIVE mg/dL
Ketones, ur: NEGATIVE mg/dL
Leukocytes, UA: NEGATIVE
Nitrite: NEGATIVE
Protein, ur: NEGATIVE mg/dL
Specific Gravity, Urine: 1.02 (ref 1.005–1.030)
Urobilinogen, UA: 0.2 mg/dL (ref 0.0–1.0)
pH: 5.5 (ref 5.0–8.0)

## 2012-04-17 LAB — URINE MICROSCOPIC-ADD ON

## 2012-04-17 MED ORDER — ONDANSETRON 8 MG PO TBDP
8.0000 mg | ORAL_TABLET | ORAL | Status: AC
  Administered 2012-04-17: 8 mg via ORAL
  Filled 2012-04-17 (×2): qty 1

## 2012-04-17 MED ORDER — KETOROLAC TROMETHAMINE 60 MG/2ML IM SOLN
60.0000 mg | INTRAMUSCULAR | Status: AC
  Administered 2012-04-17: 60 mg via INTRAMUSCULAR
  Filled 2012-04-17: qty 2

## 2012-04-17 MED ORDER — OXYCODONE-ACETAMINOPHEN 5-325 MG PO TABS
2.0000 | ORAL_TABLET | ORAL | Status: AC
  Administered 2012-04-17: 2 via ORAL
  Filled 2012-04-17: qty 2

## 2012-04-17 MED ORDER — AZITHROMYCIN 250 MG PO TABS
1000.0000 mg | ORAL_TABLET | ORAL | Status: AC
  Administered 2012-04-17: 1000 mg via ORAL
  Filled 2012-04-17: qty 4

## 2012-04-17 MED ORDER — AZITHROMYCIN 500 MG PO TABS: ORAL_TABLET | ORAL | Status: DC

## 2012-04-17 MED ORDER — CEFTRIAXONE SODIUM 250 MG IJ SOLR
250.0000 mg | INTRAMUSCULAR | Status: AC
  Administered 2012-04-17: 250 mg via INTRAMUSCULAR
  Filled 2012-04-17: qty 250

## 2012-04-17 NOTE — MAU Provider Note (Signed)
Chief Complaint: Pelvic Pain   None    SUBJECTIVE HPI: [name] is a 26 y.o. [id number] who presents to maternity admissions reporting pelvic pain, ongoing for several years due to endometriosis but worsening in the last month.  She was seen in ED at [facility] on 12/2 and 12/22 for her symptoms and was diagnosed with a prolapsed uterus on a pelvic exam.  She had a f/u appointment with Dr [name] but does not have insurance and could not afford this appointment.  She has frequent ED visits for pain and has allergies to some NSAIDs but reports taking ibuprofen without difficulty, although it does not help her pain. She reports being treated for PID in the past but has been treated for STDs since this diagnosis, approximately 6 months ago.  She is having some spotting today, denies heavy vaginal bleeding, vaginal itching/burning, urinary symptoms, h/a, dizziness, n/v, or fever/chills.     Past Medical History  Diagnosis Date  . STD (female)     hx of chlamydia and gonorrhea  . History of PID   . Endometriosis   . Glaucoma(365)   . Ovarian cyst    Past Surgical History  Procedure Date  . Fracture surgery     left leg  . Dilation and curettage of uterus   . Laparoscopy    History   Social History  . Marital Status: Single    Spouse Name: N/A    Number of Children: N/A  . Years of Education: N/A   Occupational History  . Not on file.   Social History Main Topics  . Smoking status: Current Every Day Smoker -- 0.5 packs/day    Types: Cigarettes  . Smokeless tobacco: Not on file  . Alcohol Use: No     Comment: Denies ETOH use  . Drug Use: No     Comment: Pt states no drugs x 2 weeks.  Marland Kitchen Sexually Active: Yes    Birth Control/ Protection: Condom, None   Other Topics Concern  . Not on file   Social History Narrative  . No narrative on file   No current facility-administered medications on file prior to encounter.   Current Outpatient Prescriptions on File Prior to  Encounter  Medication Sig Dispense Refill  . ibuprofen (ADVIL,MOTRIN) 200 MG tablet Take 600 mg by mouth every 8 (eight) hours as needed. For pain.       Allergies  Allergen Reactions  . Darvocet (Propoxyphene-Acetaminophen) Hives  . Doxycycline Swelling  . Flexeril (Cyclobenzaprine) Nausea And Vomiting  . Metoclopramide Hives  . Naproxen Hives  . Latex Rash  . Tramadol Rash    ROS: Pertinent items in HPI  OBJECTIVE Blood pressure 122/84, pulse 66, temperature 98.3 F (36.8 C), temperature source Oral, resp. rate 18, height 5\' 3"  (1.6 m), weight 67.586 kg (149 lb), last menstrual period 03/18/2012, SpO2 97.00%. GENERAL: Well-developed, well-nourished female in no acute distress.  HEENT: Normocephalic HEART: normal rate RESP: normal effort ABDOMEN: Soft, non-tender EXTREMITIES: Nontender, no edema NEURO: Alert and oriented SPECULUM EXAM: Deferred--done 12/22  LAB RESULTS Results for orders placed during the hospital encounter of 04/17/12 (from the past 24 hour(s))  URINALYSIS, ROUTINE W REFLEX MICROSCOPIC     Status: Abnormal   Collection Time   04/17/12  8:03 PM      Component Value Range   Color, Urine YELLOW  YELLOW   APPearance CLEAR  CLEAR   Specific Gravity, Urine 1.020  1.005 - 1.030   pH 5.5  5.0 - 8.0   Glucose, UA NEGATIVE  NEGATIVE mg/dL   Hgb urine dipstick MODERATE (*) NEGATIVE   Bilirubin Urine NEGATIVE  NEGATIVE   Ketones, ur NEGATIVE  NEGATIVE mg/dL   Protein, ur NEGATIVE  NEGATIVE mg/dL   Urobilinogen, UA 0.2  0.0 - 1.0 mg/dL   Nitrite NEGATIVE  NEGATIVE   Leukocytes, UA NEGATIVE  NEGATIVE  URINE MICROSCOPIC-ADD ON     Status: Normal   Collection Time   04/17/12  8:03 PM      Component Value Range   Squamous Epithelial / LPF RARE  RARE   WBC, UA 0-2  <3 WBC/hpf   RBC / HPF 0-2  <3 RBC/hpf  POCT PREGNANCY, URINE     Status: Normal   Collection Time   04/17/12  8:09 PM      Component Value Range   Preg Test, Ur NEGATIVE  NEGATIVE     ASSESSMENT 1. PID (acute pelvic inflammatory disease)   2. Pelvic pain in female     PLAN Rocephin 250 mg IM x1 dose in MAU Toradol 60 mg IM and Percocet 5/325 x2 tabs in MAU Azithromycin 1000 mg x1 dose now, 1 dose prescribed to take in 1 week (alternative treatment listed in Up to Date, pt unable to afford Doxycycline) Discharge home Message sent to Gyn clinic for pt to f/u for chronic pelvic pain Return to MAU as needed    Medication List     As of 04/17/2012  9:05 PM    ASK your doctor about these medications         ibuprofen 200 MG tablet   Commonly known as: ADVIL,MOTRIN   Take 600 mg by mouth every 8 (eight) hours as needed. For pain.         Sharen Counter Certified Nurse-Midwife 04/17/2012  9:05 PM

## 2012-04-17 NOTE — MAU Note (Signed)
Pt reports history of endometriosis and states she was seen at [facility] on 12/02 and was told she has a prolapsed uterus. States she has had pain since that visit, but it has worsened over the last 3 days, states they referred her to Dr Tamela Oddi but her insurance is not in effect yet so she did not have the money to go.

## 2012-04-19 NOTE — MAU Provider Note (Signed)
Attestation of Attending Supervision of Advanced Practitioner (PA/CNM/NP): Evaluation and management procedures were performed by the Advanced Practitioner under my supervision and collaboration.  I have reviewed the Advanced Practitioner's note and chart, and I agree with the management and plan.  Reis Goga, MD, FACOG Attending Obstetrician & Gynecologist Faculty Practice, [facility]  

## 2012-04-27 ENCOUNTER — Encounter: Payer: Self-pay | Admitting: Obstetrics & Gynecology

## 2012-05-21 ENCOUNTER — Encounter: Payer: Self-pay | Admitting: Obstetrics & Gynecology

## 2012-05-25 ENCOUNTER — Emergency Department (HOSPITAL_COMMUNITY)
Admission: EM | Admit: 2012-05-25 | Discharge: 2012-05-25 | Disposition: A | Discharge status: Home / Self Care | Payer: Self-pay | Attending: Emergency Medicine | Admitting: Emergency Medicine

## 2012-05-25 ENCOUNTER — Encounter (HOSPITAL_COMMUNITY): Payer: Self-pay | Admitting: Emergency Medicine

## 2012-05-25 DIAGNOSIS — F17200 Nicotine dependence, unspecified, uncomplicated: Secondary | ICD-10-CM | POA: Insufficient documentation

## 2012-05-25 DIAGNOSIS — N730 Acute parametritis and pelvic cellulitis: Secondary | ICD-10-CM | POA: Insufficient documentation

## 2012-05-25 DIAGNOSIS — H409 Unspecified glaucoma: Secondary | ICD-10-CM | POA: Insufficient documentation

## 2012-05-25 DIAGNOSIS — Z8781 Personal history of (healed) traumatic fracture: Secondary | ICD-10-CM | POA: Insufficient documentation

## 2012-05-25 DIAGNOSIS — Z8742 Personal history of other diseases of the female genital tract: Secondary | ICD-10-CM | POA: Insufficient documentation

## 2012-05-25 DIAGNOSIS — Z8619 Personal history of other infectious and parasitic diseases: Secondary | ICD-10-CM | POA: Insufficient documentation

## 2012-05-25 DIAGNOSIS — F1410 Cocaine abuse, uncomplicated: Secondary | ICD-10-CM | POA: Insufficient documentation

## 2012-05-25 DIAGNOSIS — N390 Urinary tract infection, site not specified: Secondary | ICD-10-CM | POA: Insufficient documentation

## 2012-05-25 DIAGNOSIS — N949 Unspecified condition associated with female genital organs and menstrual cycle: Secondary | ICD-10-CM | POA: Insufficient documentation

## 2012-05-25 DIAGNOSIS — Z3202 Encounter for pregnancy test, result negative: Secondary | ICD-10-CM | POA: Insufficient documentation

## 2012-05-25 LAB — RAPID URINE DRUG SCREEN, HOSP PERFORMED
Amphetamines: NOT DETECTED
Barbiturates: NOT DETECTED
Benzodiazepines: NOT DETECTED
Cocaine: POSITIVE — AB

## 2012-05-25 LAB — URINE MICROSCOPIC-ADD ON

## 2012-05-25 LAB — URINALYSIS, ROUTINE W REFLEX MICROSCOPIC
Bilirubin Urine: NEGATIVE
Nitrite: POSITIVE — AB
Specific Gravity, Urine: 1.012 (ref 1.005–1.030)
Urobilinogen, UA: 0.2 mg/dL (ref 0.0–1.0)
pH: 6.5 (ref 5.0–8.0)

## 2012-05-25 LAB — COMPREHENSIVE METABOLIC PANEL
ALT: 8 U/L (ref 0–35)
AST: 11 U/L (ref 0–37)
Albumin: 3.4 g/dL — ABNORMAL LOW (ref 3.5–5.2)
Alkaline Phosphatase: 51 U/L (ref 39–117)
Potassium: 3.8 mEq/L (ref 3.5–5.1)
Sodium: 137 mEq/L (ref 135–145)
Total Protein: 6.6 g/dL (ref 6.0–8.3)

## 2012-05-25 LAB — CBC WITH DIFFERENTIAL/PLATELET
Basophils Absolute: 0 10*3/uL (ref 0.0–0.1)
Basophils Relative: 0 % (ref 0–1)
Eosinophils Absolute: 0.1 10*3/uL (ref 0.0–0.7)
Lymphs Abs: 2 10*3/uL (ref 0.7–4.0)
MCH: 30.1 pg (ref 26.0–34.0)
MCHC: 34.6 g/dL (ref 30.0–36.0)
Neutrophils Relative %: 61 % (ref 43–77)
Platelets: 413 10*3/uL — ABNORMAL HIGH (ref 150–400)
RBC: 4.39 MIL/uL (ref 3.87–5.11)
RDW: 14.1 % (ref 11.5–15.5)

## 2012-05-25 LAB — WET PREP, GENITAL: Clue Cells Wet Prep HPF POC: NONE SEEN

## 2012-05-25 LAB — PREGNANCY, URINE: Preg Test, Ur: NEGATIVE

## 2012-05-25 MED ORDER — METRONIDAZOLE 500 MG PO TABS
500.0000 mg | ORAL_TABLET | Freq: Once | ORAL | Status: AC
  Administered 2012-05-25: 500 mg via ORAL
  Filled 2012-05-25: qty 1

## 2012-05-25 MED ORDER — OXYCODONE-ACETAMINOPHEN 7.5-325 MG PO TABS: 1.0000 | ORAL_TABLET | ORAL | Status: DC | PRN

## 2012-05-25 MED ORDER — KETOROLAC TROMETHAMINE 30 MG/ML IJ SOLN
30.0000 mg | Freq: Once | INTRAMUSCULAR | Status: AC
  Administered 2012-05-25: 30 mg via INTRAVENOUS
  Filled 2012-05-25: qty 1

## 2012-05-25 MED ORDER — ONDANSETRON HCL 4 MG/2ML IJ SOLN
4.0000 mg | Freq: Once | INTRAMUSCULAR | Status: AC
  Administered 2012-05-25: 4 mg via INTRAVENOUS
  Filled 2012-05-25: qty 2

## 2012-05-25 MED ORDER — AZITHROMYCIN 250 MG PO TABS
1000.0000 mg | ORAL_TABLET | Freq: Once | ORAL | Status: AC
  Administered 2012-05-25: 1000 mg via ORAL
  Filled 2012-05-25: qty 4

## 2012-05-25 MED ORDER — METRONIDAZOLE 500 MG PO TABS: 500.0000 mg | ORAL_TABLET | Freq: Three times a day (TID) | ORAL | Status: DC

## 2012-05-25 MED ORDER — PROMETHAZINE HCL 25 MG PO TABS: 25.0000 mg | ORAL_TABLET | Freq: Four times a day (QID) | ORAL | Status: DC | PRN

## 2012-05-25 MED ORDER — CEPHALEXIN 500 MG PO CAPS: 500.0000 mg | ORAL_CAPSULE | Freq: Four times a day (QID) | ORAL | Status: DC

## 2012-05-25 MED ORDER — SODIUM CHLORIDE 0.9 % IV BOLUS (SEPSIS)
1000.0000 mL | Freq: Once | INTRAVENOUS | Status: AC
  Administered 2012-05-25: 1000 mL via INTRAVENOUS

## 2012-05-25 MED ORDER — AZITHROMYCIN 500 MG PO TABS: 1000.0000 mg | ORAL_TABLET | Freq: Once | ORAL | Status: DC

## 2012-05-25 MED ORDER — CEFTRIAXONE SODIUM 1 G IJ SOLR
1.0000 g | INTRAMUSCULAR | Status: DC
  Administered 2012-05-25: 1 g via INTRAVENOUS
  Filled 2012-05-25: qty 10

## 2012-05-25 MED ORDER — HYDROMORPHONE HCL PF 1 MG/ML IJ SOLN
1.0000 mg | Freq: Once | INTRAMUSCULAR | Status: AC
  Administered 2012-05-25: 1 mg via INTRAVENOUS
  Filled 2012-05-25: qty 1

## 2012-05-25 NOTE — ED Notes (Signed)
Per patient, states she has a "pelvic infection"-states she has constant burning-no discharge/no odor-also has a cocaine problem, does a gram a day-would like some help

## 2012-05-25 NOTE — Progress Notes (Signed)
Pt without pcp and coverage noted CM spoke with pt who confirms self pay [facility] resident with no pcp. CM discussed and provided written information with him and a female at bedside for self pay pcps, importance of pcp for f/u care, www.needymeds.org, discounted pharmacies, and other [place] resources such as financial assistance, DSS and  health department Reviewed Health connect number to assist with finding self pay provider close to pt's residence. Reviewed resources for health reform, Jovita Kussmaul, general medical clinics, [facility] out patient [facility]ies, housing, and other resources in [employer]. Pt voiced understanding and appreciation of resources provided All information given to female at bedside

## 2012-05-25 NOTE — ED Provider Notes (Signed)
History     CSN: [id number]  Arrival date & time 05/25/12  1411   First MD Initiated Contact with Patient 05/25/12 1526      Chief Complaint  Patient presents with  . pelvic infection   . cocanine abuse     (Consider location/radiation/quality/duration/timing/severity/associated sxs/prior treatment) The history is provided by the patient.   27 year old female comes in with 3 day history of lower down all pain and vaginal pain. Pain is severe and sharp. She rates it at 9/10. It is worse with walking, with coughing or sneezing, and worse with intercourse. Nothing makes it any better. She denies fever, chills, sweats. She has had nausea with intermittent vomiting. She's had urinary frequency and dysuria. She was treated for similar symptoms about one month ago and had some slight improvement but never got back to normal. She is sexually monogamous but her partner was not treated after she was treated. She also wishes referral for cocaine detox.  Past Medical History  Diagnosis Date  . STD (female)     hx of chlamydia and gonorrhea  . History of PID   . Endometriosis   . Glaucoma(365)   . Ovarian cyst     Past Surgical History  Procedure Date  . Fracture surgery     left leg  . Dilation and curettage of uterus   . Laparoscopy     Family History  Problem Relation Age of Onset  . Anesthesia problems Neg Hx   . Hypotension Neg Hx   . Malignant hyperthermia Neg Hx   . Pseudochol deficiency Neg Hx   . Alcohol abuse Neg Hx     History  Substance Use Topics  . Smoking status: Current Every Day Smoker -- 0.5 packs/day    Types: Cigarettes  . Smokeless tobacco: Not on file  . Alcohol Use: No     Comment: Denies ETOH use    OB History    Grav Para Term Preterm Abortions TAB SAB Ect Mult Living   3 1 1  0 2 0 2 0 0 1      Review of Systems  All other systems reviewed and are negative.    Allergies  Darvocet; Doxycycline; Flexeril; Metoclopramide; Naproxen; Latex;  and Tramadol  Home Medications  No current outpatient prescriptions on file.  BP 131/80  Pulse 83  Temp 98.3 F (36.8 C) (Oral)  Resp 18  SpO2 100%  LMP 05/11/2012  Physical Exam  Nursing note and vitals reviewed.  27 year old female, resting comfortably and in no acute distress. Vital signs are normal. Oxygen saturation is 100%, which is normal. Head is normocephalic and atraumatic. PERRLA, EOMI. Oropharynx is clear. Neck is nontender and supple without adenopathy or JVD. Back is nontender and there is no CVA tenderness. Lungs are clear without rales, wheezes, or rhonchi. Chest is nontender. Heart has regular rate and rhythm without murmur. Abdomen is soft, flat, with moderate tenderness across the lower abdomen. There is no rebound or guarding. There are no masses or hepatosplenomegaly and peristalsis is hypoactive. Pelvic: Normal external female genitalia, cervix appears normal, no significant discharge. There is marked cervical motion tenderness and marked tenderness bilaterally in the adnexae. No adnexal masses palpated.  Extremities have no cyanosis or edema, full range of motion is present. Skin is warm and dry without rash. Neurologic: Mental status is normal, cranial nerves are intact, there are no motor or sensory deficits.  ED Course  Procedures (including critical care time)  Results for orders  placed during the hospital encounter of 05/25/12  URINALYSIS, ROUTINE W REFLEX MICROSCOPIC      Component Value Range   Color, Urine YELLOW  YELLOW   APPearance CLEAR  CLEAR   Specific Gravity, Urine 1.012  1.005 - 1.030   pH 6.5  5.0 - 8.0   Glucose, UA NEGATIVE  NEGATIVE mg/dL   Hgb urine dipstick TRACE (*) NEGATIVE   Bilirubin Urine NEGATIVE  NEGATIVE   Ketones, ur NEGATIVE  NEGATIVE mg/dL   Protein, ur NEGATIVE  NEGATIVE mg/dL   Urobilinogen, UA 0.2  0.0 - 1.0 mg/dL   Nitrite POSITIVE (*) NEGATIVE   Leukocytes, UA SMALL (*) NEGATIVE  PREGNANCY, URINE       Component Value Range   Preg Test, Ur NEGATIVE  NEGATIVE  WET PREP, GENITAL      Component Value Range   Yeast Wet Prep HPF POC NONE SEEN  NONE SEEN   Trich, Wet Prep NONE SEEN  NONE SEEN   Clue Cells Wet Prep HPF POC NONE SEEN  NONE SEEN   WBC, Wet Prep HPF POC RARE (*) NONE SEEN  CBC WITH DIFFERENTIAL      Component Value Range   WBC 7.7  4.0 - 10.5 K/uL   RBC 4.39  3.87 - 5.11 MIL/uL   Hemoglobin 13.2  12.0 - 15.0 g/dL   HCT 16.1  09.6 - 04.5 %   MCV 87.0  78.0 - 100.0 fL   MCH 30.1  26.0 - 34.0 pg   MCHC 34.6  30.0 - 36.0 g/dL   RDW 40.9  81.1 - 91.4 %   Platelets 413 (*) 150 - 400 K/uL   Neutrophils Relative 61  43 - 77 %   Neutro Abs 4.7  1.7 - 7.7 K/uL   Lymphocytes Relative 26  12 - 46 %   Lymphs Abs 2.0  0.7 - 4.0 K/uL   Monocytes Relative 12  3 - 12 %   Monocytes Absolute 0.9  0.1 - 1.0 K/uL   Eosinophils Relative 1  0 - 5 %   Eosinophils Absolute 0.1  0.0 - 0.7 K/uL   Basophils Relative 0  0 - 1 %   Basophils Absolute 0.0  0.0 - 0.1 K/uL  COMPREHENSIVE METABOLIC PANEL      Component Value Range   Sodium 137  135 - 145 mEq/L   Potassium 3.8  3.5 - 5.1 mEq/L   Chloride 104  96 - 112 mEq/L   CO2 25  19 - 32 mEq/L   Glucose, Bld 89  70 - 99 mg/dL   BUN 7  6 - 23 mg/dL   Creatinine, Ser 7.82  0.50 - 1.10 mg/dL   Calcium 8.8  8.4 - 95.6 mg/dL   Total Protein 6.6  6.0 - 8.3 g/dL   Albumin 3.4 (*) 3.5 - 5.2 g/dL   AST 11  0 - 37 U/L   ALT 8  0 - 35 U/L   Alkaline Phosphatase 51  39 - 117 U/L   Total Bilirubin 0.2 (*) 0.3 - 1.2 mg/dL   GFR calc non Af Amer >90  >90 mL/min   GFR calc Af Amer >90  >90 mL/min  ETHANOL      Component Value Range   Alcohol, Ethyl (B) <11  0 - 11 mg/dL  URINE RAPID DRUG SCREEN (HOSP PERFORMED)      Component Value Range   Opiates NONE DETECTED  NONE DETECTED   Cocaine POSITIVE (*) NONE DETECTED  Benzodiazepines NONE DETECTED  NONE DETECTED   Amphetamines NONE DETECTED  NONE DETECTED   Tetrahydrocannabinol POSITIVE (*) NONE  DETECTED   Barbiturates NONE DETECTED  NONE DETECTED  URINE MICROSCOPIC-ADD ON      Component Value Range   Squamous Epithelial / LPF RARE  RARE   WBC, UA 0-2  <3 WBC/hpf   RBC / HPF 0-2  <3 RBC/hpf   Bacteria, UA MANY (*) RARE     1. PID (acute pelvic inflammatory disease)   2. Urinary tract infection   3. Cocaine abuse       MDM  Pelvic pain worrisome for pelvic inflammatory disease. Old charts are reviewed and she was treated for PID on December 24 but no cultures were obtained at that time and cultures that had been ordered and an ED visit 2 days prior were canceled and never completed. Also, her treatment consisted of a single dose of ceftriaxone and a single dose of azithromycin.  Urinalysis shows evidence of urinary tract infection but clinical exam is also consistent with pelvic inflammatory disease. She will need to be treated for both. Because of allergy to doxycycline, she will need to be treated with ceftriaxone and azithromycin with repeat dose of azithromycin in 7 days. She'll also be given prescriptions for metronidazole and cephalexin. Prescriptions are given for Percocet and Phenergan. She is advised that her partner needs to be treated as well or else she will become reinfected once she has been cured. Followup will be arranged with women's clinic following completion of antibiotics.    Dione Booze, MD 05/25/12 2004

## 2012-05-26 LAB — GC/CHLAMYDIA PROBE AMP
CT Probe RNA: NEGATIVE
GC Probe RNA: NEGATIVE

## 2012-06-03 ENCOUNTER — Encounter (HOSPITAL_COMMUNITY): Payer: Self-pay | Admitting: Emergency Medicine

## 2012-06-03 ENCOUNTER — Emergency Department (HOSPITAL_COMMUNITY): Payer: Self-pay

## 2012-06-03 ENCOUNTER — Emergency Department (HOSPITAL_COMMUNITY)
Admission: EM | Admit: 2012-06-03 | Discharge: 2012-06-04 | Disposition: A | Discharge status: Home / Self Care | Payer: Self-pay | Attending: Emergency Medicine | Admitting: Emergency Medicine

## 2012-06-03 DIAGNOSIS — Z3202 Encounter for pregnancy test, result negative: Secondary | ICD-10-CM | POA: Insufficient documentation

## 2012-06-03 DIAGNOSIS — R112 Nausea with vomiting, unspecified: Secondary | ICD-10-CM | POA: Insufficient documentation

## 2012-06-03 DIAGNOSIS — N898 Other specified noninflammatory disorders of vagina: Secondary | ICD-10-CM | POA: Insufficient documentation

## 2012-06-03 DIAGNOSIS — Z8669 Personal history of other diseases of the nervous system and sense organs: Secondary | ICD-10-CM | POA: Insufficient documentation

## 2012-06-03 DIAGNOSIS — Z9889 Other specified postprocedural states: Secondary | ICD-10-CM | POA: Insufficient documentation

## 2012-06-03 DIAGNOSIS — R109 Unspecified abdominal pain: Secondary | ICD-10-CM | POA: Insufficient documentation

## 2012-06-03 DIAGNOSIS — Z8742 Personal history of other diseases of the female genital tract: Secondary | ICD-10-CM | POA: Insufficient documentation

## 2012-06-03 DIAGNOSIS — F17200 Nicotine dependence, unspecified, uncomplicated: Secondary | ICD-10-CM | POA: Insufficient documentation

## 2012-06-03 DIAGNOSIS — Z8619 Personal history of other infectious and parasitic diseases: Secondary | ICD-10-CM | POA: Insufficient documentation

## 2012-06-03 DIAGNOSIS — N739 Female pelvic inflammatory disease, unspecified: Secondary | ICD-10-CM | POA: Insufficient documentation

## 2012-06-03 LAB — WET PREP, GENITAL
Clue Cells Wet Prep HPF POC: NONE SEEN
Trich, Wet Prep: NONE SEEN

## 2012-06-03 LAB — CBC WITH DIFFERENTIAL/PLATELET
Basophils Absolute: 0 10*3/uL (ref 0.0–0.1)
Basophils Relative: 0 % (ref 0–1)
Eosinophils Absolute: 0 10*3/uL (ref 0.0–0.7)
Eosinophils Relative: 0 % (ref 0–5)
HCT: 38.8 % (ref 36.0–46.0)
Hemoglobin: 13.3 g/dL (ref 12.0–15.0)
MCH: 30 pg (ref 26.0–34.0)
MCHC: 34.3 g/dL (ref 30.0–36.0)
Monocytes Absolute: 0.4 10*3/uL (ref 0.1–1.0)
Monocytes Relative: 9 % (ref 3–12)
Neutro Abs: 2.7 10*3/uL (ref 1.7–7.7)
RDW: 14 % (ref 11.5–15.5)

## 2012-06-03 LAB — URINALYSIS, ROUTINE W REFLEX MICROSCOPIC
Bilirubin Urine: NEGATIVE
Nitrite: NEGATIVE
Protein, ur: NEGATIVE mg/dL
Specific Gravity, Urine: 1.016 (ref 1.005–1.030)
Urobilinogen, UA: 0.2 mg/dL (ref 0.0–1.0)

## 2012-06-03 LAB — URINE MICROSCOPIC-ADD ON

## 2012-06-03 MED ORDER — OXYCODONE-ACETAMINOPHEN 5-325 MG PO TABS
1.0000 | ORAL_TABLET | Freq: Once | ORAL | Status: AC
  Administered 2012-06-03: 1 via ORAL
  Filled 2012-06-03: qty 1

## 2012-06-03 MED ORDER — AZITHROMYCIN 250 MG PO TABS: ORAL_TABLET | ORAL | Status: DC

## 2012-06-03 MED ORDER — AZITHROMYCIN 250 MG PO TABS
500.0000 mg | ORAL_TABLET | Freq: Once | ORAL | Status: AC
  Administered 2012-06-03: 500 mg via ORAL
  Filled 2012-06-03 (×2): qty 1

## 2012-06-03 MED ORDER — HYDROMORPHONE HCL PF 1 MG/ML IJ SOLN
1.0000 mg | Freq: Once | INTRAMUSCULAR | Status: AC
  Administered 2012-06-03: 1 mg via INTRAMUSCULAR

## 2012-06-03 MED ORDER — HYDROMORPHONE HCL PF 1 MG/ML IJ SOLN
1.0000 mg | Freq: Once | INTRAMUSCULAR | Status: DC
  Filled 2012-06-03: qty 1

## 2012-06-03 MED ORDER — METRONIDAZOLE 500 MG PO TABS
500.0000 mg | ORAL_TABLET | Freq: Once | ORAL | Status: AC
  Administered 2012-06-03: 500 mg via ORAL
  Filled 2012-06-03: qty 1

## 2012-06-03 MED ORDER — CEFTRIAXONE SODIUM 250 MG IJ SOLR
250.0000 mg | Freq: Once | INTRAMUSCULAR | Status: AC
  Administered 2012-06-03: 250 mg via INTRAMUSCULAR
  Filled 2012-06-03: qty 250

## 2012-06-03 MED ORDER — METRONIDAZOLE 500 MG PO TABS: 500.0000 mg | ORAL_TABLET | Freq: Two times a day (BID) | ORAL | Status: DC

## 2012-06-03 MED ORDER — OXYCODONE-ACETAMINOPHEN 7.5-325 MG PO TABS: 1.0000 | ORAL_TABLET | Freq: Four times a day (QID) | ORAL | Status: DC | PRN

## 2012-06-03 NOTE — ED Provider Notes (Addendum)
History     CSN: [id number]  Arrival date & time 06/03/12  1741   First MD Initiated Contact with Patient 06/03/12 1926      Chief Complaint  Patient presents with  . Pelvic Pain  . Migraine    (Consider location/radiation/quality/duration/timing/severity/associated sxs/prior treatment) HPI Patient presents with concern of persistent vaginal discharge, suprapubic pain, nausea.  Notably, the patient was seen here one week ago, evaluated for these complaints.  She was diagnosed with PID, received antibiotics, and after discharge initially was better.  She notes over the past 2 or 3 days she has had increasing nausea, episodic vomiting.  She continues to have foul-smelling vaginal discharge, suprapubic pain.  There is no new fever, no chills, no new dyspnea, no new other focal complaints. She has not seen a physician since that emergency department evaluation. She states that she has a history of similar prior events in the distant past. She has no primary care physician. Past Medical History  Diagnosis Date  . STD (female)     hx of chlamydia and gonorrhea  . History of PID   . Endometriosis   . Glaucoma(365)   . Ovarian cyst     Past Surgical History  Procedure Laterality Date  . Fracture surgery      left leg  . Dilation and curettage of uterus    . Laparoscopy      Family History  Problem Relation Age of Onset  . Anesthesia problems Neg Hx   . Hypotension Neg Hx   . Malignant hyperthermia Neg Hx   . Pseudochol deficiency Neg Hx   . Alcohol abuse Neg Hx     History  Substance Use Topics  . Smoking status: Current Every Day Smoker -- 0.50 packs/day    Types: Cigarettes  . Smokeless tobacco: Not on file  . Alcohol Use: No     Comment: Denies ETOH use    OB History   Grav Para Term Preterm Abortions TAB SAB Ect Mult Living   3 1 1  0 2 0 2 0 0 1      Review of Systems  Constitutional:       Per HPI, otherwise negative  HENT:       Per HPI, otherwise  negative  Respiratory:       Per HPI, otherwise negative  Cardiovascular:       Per HPI, otherwise negative  Gastrointestinal: Positive for nausea and vomiting.  Endocrine:       Negative aside from HPI  Genitourinary:       Neg aside from HPI   Musculoskeletal:       Per HPI, otherwise negative  Skin: Negative.   Neurological: Negative for syncope.    Allergies  Darvocet; Doxycycline; Flexeril; Metoclopramide; Naproxen; Latex; and Tramadol  Home Medications  No current outpatient prescriptions on file.  BP 110/75  Pulse 71  Temp(Src) 98.5 F (36.9 C) (Oral)  Resp 18  SpO2 100%  LMP 05/11/2012  Physical Exam  Nursing note and vitals reviewed. Constitutional: She is oriented to person, place, and time. She appears well-developed and well-nourished. No distress.  HENT:  Head: Normocephalic and atraumatic.  Eyes: Conjunctivae and EOM are normal.  Cardiovascular: Normal rate and regular rhythm.   Pulmonary/Chest: Effort normal and breath sounds normal. No stridor. No respiratory distress.  Abdominal: She exhibits no distension.  Genitourinary:    There is no rash, tenderness or lesion on the right labia. There is no rash, tenderness or lesion  on the left labia. There is tenderness around the vagina. No erythema or bleeding around the vagina. No foreign body around the vagina. Vaginal discharge found.  Musculoskeletal: She exhibits no edema.  Neurological: She is alert and oriented to person, place, and time. No cranial nerve deficit.  Skin: Skin is warm and dry.  Psychiatric: She has a normal mood and affect.    ED Course  Procedures (including critical care time)  Labs Reviewed  WET PREP, GENITAL - Abnormal; Notable for the following:    WBC, Wet Prep HPF POC FEW (*)    All other components within normal limits  URINALYSIS, ROUTINE W REFLEX MICROSCOPIC - Abnormal; Notable for the following:    Hgb urine dipstick TRACE (*)    All other components within normal  limits  GC/CHLAMYDIA PROBE AMP  PREGNANCY, URINE  CBC WITH DIFFERENTIAL  URINE MICROSCOPIC-ADD ON   US Transvaginal Non-ob  06/03/2012  *RADIOLOGY REPORT*  Clinical Data: Left-sided pelvic pain.  TRANSABDOMINAL AND TRANSVAGINAL ULTRASOUND OF PELVIS Technique:  Both transabdominal and transvaginal ultrasound examinations of the pelvis were performed. Transabdominal technique was performed for global imaging of the pelvis including uterus, ovaries, adnexal regions, and pelvic cul-de-sac.  It was necessary to proceed with endovaginal exam following the transabdominal exam to visualize the uterus and ovaries in greater detail.  Comparison:  None  Findings:  Uterus: Normal in size and appearance; measures 7.8 x 3.6 x 4.4 cm.  Endometrium: Normal in thickness and appearance; measures 0.9 cm in thickness.  Right ovary:  A mildly complex 1.9 cm cyst at the right ovary could reflect a small hemorrhagic cyst.  The right ovary is otherwise unremarkable in appearance.  It measures 4.9 x 2.3 x 2.8 cm.  Left ovary: Normal appearance/no adnexal mass; measures 4.3 x 2.1 x 2.1 cm.  Other findings: A trace amount of free fluid within the pelvic cul- de-sac is likely physiologic in nature.  Limited Doppler evaluation demonstrates normal color Doppler blood flow with respect to both ovaries; there is no evidence for ovarian torsion.  Peristalsing bowel is noted at the right adnexa.  IMPRESSION:  1.  No evidence for tubo-ovarian abscess; the left adnexa is unremarkable in appearance. 2.  No evidence for ovarian torsion. 3.  Question of small 1.9 cm right-sided hemorrhagic cyst; right ovary otherwise unremarkable. 4.  Uterus unremarkable in appearance.   Original Report Authenticated By: Tonia Ghent, M.D.    US Pelvis Complete  06/03/2012  *RADIOLOGY REPORT*  Clinical Data: Left-sided pelvic pain.  TRANSABDOMINAL AND TRANSVAGINAL ULTRASOUND OF PELVIS Technique:  Both transabdominal and transvaginal ultrasound examinations of  the pelvis were performed. Transabdominal technique was performed for global imaging of the pelvis including uterus, ovaries, adnexal regions, and pelvic cul-de-sac.  It was necessary to proceed with endovaginal exam following the transabdominal exam to visualize the uterus and ovaries in greater detail.  Comparison:  None  Findings:  Uterus: Normal in size and appearance; measures 7.8 x 3.6 x 4.4 cm.  Endometrium: Normal in thickness and appearance; measures 0.9 cm in thickness.  Right ovary:  A mildly complex 1.9 cm cyst at the right ovary could reflect a small hemorrhagic cyst.  The right ovary is otherwise unremarkable in appearance.  It measures 4.9 x 2.3 x 2.8 cm.  Left ovary: Normal appearance/no adnexal mass; measures 4.3 x 2.1 x 2.1 cm.  Other findings: A trace amount of free fluid within the pelvic cul- de-sac is likely physiologic in nature.  Limited Doppler evaluation  demonstrates normal color Doppler blood flow with respect to both ovaries; there is no evidence for ovarian torsion.  Peristalsing bowel is noted at the right adnexa.  IMPRESSION:  1.  No evidence for tubo-ovarian abscess; the left adnexa is unremarkable in appearance. 2.  No evidence for ovarian torsion. 3.  Question of small 1.9 cm right-sided hemorrhagic cyst; right ovary otherwise unremarkable. 4.  Uterus unremarkable in appearance.   Original Report Authenticated By: Tonia Ghent, M.D.      No diagnosis found.  Update: On repeat exam the patient remains in no distress.  I discussed all findings, and also to patient.  Specifically, we discussed the need to follow up with women's ambulatory clinic, and return precautions.  MDM  This generally well young female presents one week after initial evaluation for vaginal discharge pain, diagnosis was PID, now with recurrent nausea, and continued vaginal discharge and pain.  On exam the patient is afebrile, in no distress, but has cervical motion tenderness, with vaginal discharge. At  length I discussed the need for followup with the patient.  She received additional antibiotics here for PID, was discharged in stable condition.       Gerhard Munch, MD 06/03/12 2316  Gerhard Munch, MD 06/03/12 [id number]

## 2012-06-03 NOTE — ED Notes (Signed)
Pt states that she was seen a week ago for a pelvic infection and was given abx.  States she has taken all of them but she does not feel any better.  Also c/o migraine x 2 days.  Hx of same.

## 2012-06-05 LAB — GC/CHLAMYDIA PROBE AMP
CT Probe RNA: NEGATIVE
GC Probe RNA: NEGATIVE

## 2012-06-13 ENCOUNTER — Encounter (HOSPITAL_COMMUNITY): Payer: Self-pay | Admitting: Emergency Medicine

## 2012-06-13 ENCOUNTER — Emergency Department (HOSPITAL_COMMUNITY)
Admission: EM | Admit: 2012-06-13 | Discharge: 2012-06-13 | Disposition: A | Discharge status: Home / Self Care | Payer: Self-pay | Attending: Emergency Medicine | Admitting: Emergency Medicine

## 2012-06-13 DIAGNOSIS — F17200 Nicotine dependence, unspecified, uncomplicated: Secondary | ICD-10-CM | POA: Insufficient documentation

## 2012-06-13 DIAGNOSIS — N949 Unspecified condition associated with female genital organs and menstrual cycle: Secondary | ICD-10-CM | POA: Insufficient documentation

## 2012-06-13 DIAGNOSIS — R3915 Urgency of urination: Secondary | ICD-10-CM | POA: Insufficient documentation

## 2012-06-13 DIAGNOSIS — R112 Nausea with vomiting, unspecified: Secondary | ICD-10-CM | POA: Insufficient documentation

## 2012-06-13 DIAGNOSIS — G43909 Migraine, unspecified, not intractable, without status migrainosus: Secondary | ICD-10-CM | POA: Insufficient documentation

## 2012-06-13 DIAGNOSIS — A64 Unspecified sexually transmitted disease: Secondary | ICD-10-CM | POA: Insufficient documentation

## 2012-06-13 DIAGNOSIS — Z3202 Encounter for pregnancy test, result negative: Secondary | ICD-10-CM | POA: Insufficient documentation

## 2012-06-13 DIAGNOSIS — R350 Frequency of micturition: Secondary | ICD-10-CM | POA: Insufficient documentation

## 2012-06-13 DIAGNOSIS — M545 Low back pain, unspecified: Secondary | ICD-10-CM | POA: Insufficient documentation

## 2012-06-13 DIAGNOSIS — Z79899 Other long term (current) drug therapy: Secondary | ICD-10-CM | POA: Insufficient documentation

## 2012-06-13 DIAGNOSIS — H40009 Preglaucoma, unspecified, unspecified eye: Secondary | ICD-10-CM | POA: Insufficient documentation

## 2012-06-13 DIAGNOSIS — Z8742 Personal history of other diseases of the female genital tract: Secondary | ICD-10-CM | POA: Insufficient documentation

## 2012-06-13 DIAGNOSIS — R102 Pelvic and perineal pain: Secondary | ICD-10-CM

## 2012-06-13 DIAGNOSIS — R6883 Chills (without fever): Secondary | ICD-10-CM | POA: Insufficient documentation

## 2012-06-13 LAB — CBC WITH DIFFERENTIAL/PLATELET
HCT: 39.2 % (ref 36.0–46.0)
Hemoglobin: 14 g/dL (ref 12.0–15.0)
Lymphocytes Relative: 44 % (ref 12–46)
Monocytes Absolute: 0.6 10*3/uL (ref 0.1–1.0)
Monocytes Relative: 9 % (ref 3–12)
Neutro Abs: 2.9 10*3/uL (ref 1.7–7.7)
Neutrophils Relative %: 45 % (ref 43–77)
RBC: 4.54 MIL/uL (ref 3.87–5.11)
WBC: 6.6 10*3/uL (ref 4.0–10.5)

## 2012-06-13 LAB — URINALYSIS, ROUTINE W REFLEX MICROSCOPIC
Bilirubin Urine: NEGATIVE
Glucose, UA: NEGATIVE mg/dL
Ketones, ur: NEGATIVE mg/dL
Nitrite: NEGATIVE
Protein, ur: NEGATIVE mg/dL
Specific Gravity, Urine: 1.017 (ref 1.005–1.030)
Urobilinogen, UA: 0.2 mg/dL (ref 0.0–1.0)
pH: 5.5 (ref 5.0–8.0)

## 2012-06-13 LAB — COMPREHENSIVE METABOLIC PANEL
AST: 13 U/L (ref 0–37)
Alkaline Phosphatase: 59 U/L (ref 39–117)
BUN: 13 mg/dL (ref 6–23)
CO2: 23 mEq/L (ref 19–32)
Chloride: 102 mEq/L (ref 96–112)
Creatinine, Ser: 0.78 mg/dL (ref 0.50–1.10)
GFR calc non Af Amer: >90 mL/min (ref >90)
Potassium: 4.3 mEq/L (ref 3.5–5.1)
Total Bilirubin: 0.2 mg/dL — ABNORMAL LOW (ref 0.3–1.2)

## 2012-06-13 LAB — URINE MICROSCOPIC-ADD ON

## 2012-06-13 LAB — POCT PREGNANCY, URINE: Preg Test, Ur: NEGATIVE

## 2012-06-13 LAB — LIPASE, BLOOD: Lipase: 98 U/L — ABNORMAL HIGH (ref 11–59)

## 2012-06-13 MED ORDER — OXYCODONE-ACETAMINOPHEN 5-325 MG PO TABS
2.0000 | ORAL_TABLET | Freq: Once | ORAL | Status: AC
  Administered 2012-06-13: 2 via ORAL
  Filled 2012-06-13: qty 2

## 2012-06-13 MED ORDER — METRONIDAZOLE 500 MG PO TABS: 500.0000 mg | ORAL_TABLET | Freq: Two times a day (BID) | ORAL | Status: DC

## 2012-06-13 MED ORDER — SODIUM CHLORIDE 0.9 % IV BOLUS (SEPSIS): 1000.0000 mL | Freq: Once | INTRAVENOUS | Status: DC

## 2012-06-13 MED ORDER — OXYCODONE-ACETAMINOPHEN 5-325 MG PO TABS: 1.0000 | ORAL_TABLET | ORAL | Status: DC | PRN

## 2012-06-13 MED ORDER — AZITHROMYCIN 250 MG PO TABS
1000.0000 mg | ORAL_TABLET | Freq: Once | ORAL | Status: AC
  Administered 2012-06-13: 1000 mg via ORAL
  Filled 2012-06-13: qty 4

## 2012-06-13 MED ORDER — PROMETHAZINE HCL 25 MG PO TABS: 25.0000 mg | ORAL_TABLET | Freq: Four times a day (QID) | ORAL | Status: DC | PRN

## 2012-06-13 MED ORDER — CEFIXIME 400 MG PO TABS: 400.0000 mg | ORAL_TABLET | Freq: Every day | ORAL | Status: DC

## 2012-06-13 MED ORDER — CEFTRIAXONE SODIUM 250 MG IJ SOLR
250.0000 mg | Freq: Once | INTRAMUSCULAR | Status: AC
  Administered 2012-06-13: 250 mg via INTRAMUSCULAR
  Filled 2012-06-13: qty 250

## 2012-06-13 NOTE — ED Notes (Addendum)
Pt presenting to ed with c/o pelvic pain x 3 weeks pt states she has been seen here for the same. Pt states she can't take the antibiotics that have been prescribed. Pt states she also has positive back pain and nausea and vomiting. Pt denies vaginal discharge

## 2012-06-13 NOTE — ED Provider Notes (Signed)
History     CSN: [id number]  Arrival date & time 06/13/12  1313   First MD Initiated Contact with Patient 06/13/12 1354      Chief Complaint  Patient presents with  . Pelvic Pain    (Consider location/radiation/quality/duration/timing/severity/associated sxs/prior treatment) Patient is a 27 y.o. female presenting with pelvic pain. The history is provided by the patient and medical records.  Pelvic Pain This is a recurrent problem. The current episode started in the past 7 days. The problem occurs constantly. The problem has been unchanged. Associated symptoms include chills, headaches, nausea, urinary symptoms and vomiting. Pertinent negatives include no abdominal pain, anorexia, change in bowel habit, chest pain, coughing, diaphoresis, fatigue, fever, rash, sore throat or swollen glands. Nothing aggravates the symptoms. She has tried oral narcotics for the symptoms. The treatment provided mild relief.    [name] is a 27 y.o. female  with a hx of PID last treated on 06/03/12 and ovarian cyst presents to the Emergency Department complaining of gradual, persistent, progressively worsening pelvic pain onset 4 days ago. Associated symptoms include nausea, vomiting, migraines, chills, low back pain, urinary urgency, frequency.  Nothing makes it better and nothing makes it worse.  Pt denies fever,chest pain, shortness of breath, abdominal pain, diarrhea, weakness, dizziness, dysuria, hematuria, syncope.  Pt states the abx make her sick, but she has taken them anyway.  She knows her partner was evaluated, but is unsure if he was treated.    Past Medical History  Diagnosis Date  . STD (female)     hx of chlamydia and gonorrhea  . History of PID   . Endometriosis   . Glaucoma(365)   . Ovarian cyst     Past Surgical History  Procedure Laterality Date  . Fracture surgery      left leg  . Dilation and curettage of uterus    . Laparoscopy      Family History  Problem Relation Age  of Onset  . Anesthesia problems Neg Hx   . Hypotension Neg Hx   . Malignant hyperthermia Neg Hx   . Pseudochol deficiency Neg Hx   . Alcohol abuse Neg Hx     History  Substance Use Topics  . Smoking status: Current Every Day Smoker -- 0.50 packs/day    Types: Cigarettes  . Smokeless tobacco: Not on file  . Alcohol Use: No     Comment: Denies ETOH use    OB History   Grav Para Term Preterm Abortions TAB SAB Ect Mult Living   3 1 1  0 2 0 2 0 0 1      Review of Systems  Constitutional: Positive for chills. Negative for fever, diaphoresis, appetite change, fatigue and unexpected weight change.  HENT: Negative for sore throat, mouth sores and neck stiffness.   Eyes: Negative for visual disturbance.  Respiratory: Negative for cough, chest tightness, shortness of breath and wheezing.   Cardiovascular: Negative for chest pain.  Gastrointestinal: Positive for nausea and vomiting. Negative for abdominal pain, diarrhea, constipation, anorexia and change in bowel habit.  Endocrine: Negative for polydipsia, polyphagia and polyuria.  Genitourinary: Positive for urgency, frequency and pelvic pain. Negative for dysuria, hematuria, vaginal bleeding, vaginal discharge and vaginal pain.  Musculoskeletal: Negative for back pain.  Skin: Negative for rash.  Allergic/Immunologic: Negative for immunocompromised state.  Neurological: Positive for headaches. Negative for syncope and light-headedness.  Hematological: Does not bruise/bleed easily.  Psychiatric/Behavioral: Negative for sleep disturbance. The patient is not nervous/anxious.  All other systems reviewed and are negative.    Allergies  Darvocet; Doxycycline; Flexeril; Metoclopramide; Naproxen; Latex; and Tramadol  Home Medications   Current Outpatient Rx  Name  Route  Sig  Dispense  Refill  . cefixime (SUPRAX) 400 MG tablet   Oral   Take 1 tablet (400 mg total) by mouth daily.   10 tablet   0   . metroNIDAZOLE (FLAGYL) 500 MG  tablet   Oral   Take 1 tablet (500 mg total) by mouth 2 (two) times daily. One po bid x 7 days   14 tablet   0   . oxyCODONE-acetaminophen (PERCOCET) 5-325 MG per tablet   Oral   Take 1 tablet by mouth every 4 (four) hours as needed for pain.   20 tablet   0   . promethazine (PHENERGAN) 25 MG tablet   Oral   Take 1 tablet (25 mg total) by mouth every 6 (six) hours as needed for nausea.   20 tablet   0     BP 137/80  Pulse 80  Temp(Src) 98.3 F (36.8 C) (Oral)  SpO2 100%  LMP 05/11/2012  Physical Exam  Nursing note and vitals reviewed. Constitutional: She is oriented to person, place, and time. She appears well-developed and well-nourished. No distress.  HENT:  Head: Normocephalic and atraumatic.  Mouth/Throat: Oropharynx is clear and moist. No oropharyngeal exudate.  Eyes: Conjunctivae are normal. Pupils are equal, round, and reactive to light. No scleral icterus.  Neck: Normal range of motion. Neck supple.  Cardiovascular: Normal rate, regular rhythm, normal heart sounds and intact distal pulses.  Exam reveals no gallop and no friction rub.   No murmur heard. Pulmonary/Chest: Effort normal and breath sounds normal. No respiratory distress. She has no wheezes. She has no rales. She exhibits no tenderness.  Abdominal: Soft. Bowel sounds are normal. She exhibits no mass. There is no hepatosplenomegaly. There is tenderness (pelvic) in the right lower quadrant, suprapubic area and left lower quadrant. There is guarding. There is no rigidity, no rebound and no CVA tenderness.    Musculoskeletal: Normal range of motion. She exhibits no edema.  Lymphadenopathy:    She has no cervical adenopathy.  Neurological: She is alert and oriented to person, place, and time. She exhibits normal muscle tone. Coordination normal.  Speech is clear and goal oriented Moves extremities without ataxia  Skin: Skin is warm and dry. No rash noted. She is not diaphoretic. No erythema.  Psychiatric:  She has a normal mood and affect.    ED Course  Procedures (including critical care time)  Labs Reviewed  WET PREP, GENITAL - Abnormal; Notable for the following:    Clue Cells Wet Prep HPF POC FEW (*)    WBC, Wet Prep HPF POC MODERATE (*)    All other components within normal limits  URINALYSIS, ROUTINE W REFLEX MICROSCOPIC - Abnormal; Notable for the following:    APPearance CLOUDY (*)    Hgb urine dipstick SMALL (*)    Leukocytes, UA TRACE (*)    All other components within normal limits  CBC WITH DIFFERENTIAL - Abnormal; Notable for the following:    Platelets 429 (*)    All other components within normal limits  COMPREHENSIVE METABOLIC PANEL - Abnormal; Notable for the following:    Total Bilirubin 0.2 (*)    All other components within normal limits  LIPASE, BLOOD - Abnormal; Notable for the following:    Lipase 98 (*)    All other  components within normal limits  URINE MICROSCOPIC-ADD ON - Abnormal; Notable for the following:    Squamous Epithelial / LPF MANY (*)    Bacteria, UA FEW (*)    All other components within normal limits  GC/CHLAMYDIA PROBE AMP  POCT PREGNANCY, URINE   No results found.   1. STD (female)   2. Pelvic pain in female   3. History of PID       MDM  JAYNI PRESCHER presents with Hx of STD and recently treated for PID.  Patient has not refrain from sexual intercourse and it is unclear as to whether or not her partner has been treated.  Today with mildly elevated lipase and 98, CMP unremarkable, CBC unremarkable, UA with contamination and urine culture sent. Wet prep with moderate white blood cells a few clue cells.  I stressed the need to abstain from sexual intercourse until both parties have been treated in treatment has been given time to work. We'll retreat again today. Patient is afebrile, NAD, nonseptic, nontoxic appearing. I gently the patient needs IV antibiotics at this time.  Patient to be discharged with instructions to follow up with  OBGYN. Discussed importance of using protection when sexually active. Pt understands that they have GC/Chlamydia cultures pending and that they will need to inform all sexual partners if results return positive. Pt has been treated prophylacticly with azithromycin, metronidazole and rocephin due to pts history, pelvic exam, and wet prep with increased WBCs. Pt not concerning for PID because hemodynamically stable and no cervical motion tenderness on pelvic exam. Pt has also been treated with flagyl for Bacterial Vaginosis. Pt has been advised to not drink alcohol while on this medication.            Dahlia Client Kroy Sprung, PA-C 06/13/12 2108

## 2012-06-14 LAB — GC/CHLAMYDIA PROBE AMP: GC Probe RNA: NEGATIVE

## 2012-06-14 NOTE — ED Provider Notes (Signed)
Medical screening examination/treatment/procedure(s) were performed by non-physician practitioner and as supervising physician I was immediately available for consultation/collaboration.  Nikolos Billig, MD 06/14/12 0728 

## 2012-06-21 ENCOUNTER — Encounter (HOSPITAL_COMMUNITY): Payer: Self-pay | Admitting: Emergency Medicine

## 2012-06-21 ENCOUNTER — Emergency Department (HOSPITAL_COMMUNITY)
Admission: EM | Admit: 2012-06-21 | Discharge: 2012-06-21 | Disposition: A | Discharge status: Home / Self Care | Payer: Self-pay | Attending: Emergency Medicine | Admitting: Emergency Medicine

## 2012-06-21 DIAGNOSIS — N898 Other specified noninflammatory disorders of vagina: Secondary | ICD-10-CM | POA: Insufficient documentation

## 2012-06-21 DIAGNOSIS — Z8781 Personal history of (healed) traumatic fracture: Secondary | ICD-10-CM | POA: Insufficient documentation

## 2012-06-21 DIAGNOSIS — Z3202 Encounter for pregnancy test, result negative: Secondary | ICD-10-CM | POA: Insufficient documentation

## 2012-06-21 DIAGNOSIS — Z8742 Personal history of other diseases of the female genital tract: Secondary | ICD-10-CM | POA: Insufficient documentation

## 2012-06-21 DIAGNOSIS — Z8619 Personal history of other infectious and parasitic diseases: Secondary | ICD-10-CM | POA: Insufficient documentation

## 2012-06-21 DIAGNOSIS — R51 Headache: Secondary | ICD-10-CM | POA: Insufficient documentation

## 2012-06-21 DIAGNOSIS — Z8669 Personal history of other diseases of the nervous system and sense organs: Secondary | ICD-10-CM | POA: Insufficient documentation

## 2012-06-21 DIAGNOSIS — Z9889 Other specified postprocedural states: Secondary | ICD-10-CM | POA: Insufficient documentation

## 2012-06-21 DIAGNOSIS — N949 Unspecified condition associated with female genital organs and menstrual cycle: Secondary | ICD-10-CM | POA: Insufficient documentation

## 2012-06-21 DIAGNOSIS — F17200 Nicotine dependence, unspecified, uncomplicated: Secondary | ICD-10-CM | POA: Insufficient documentation

## 2012-06-21 DIAGNOSIS — M545 Low back pain, unspecified: Secondary | ICD-10-CM | POA: Insufficient documentation

## 2012-06-21 LAB — URINALYSIS, ROUTINE W REFLEX MICROSCOPIC
Glucose, UA: NEGATIVE mg/dL
Ketones, ur: NEGATIVE mg/dL
Leukocytes, UA: NEGATIVE
Nitrite: NEGATIVE
Specific Gravity, Urine: 1.016 (ref 1.005–1.030)
pH: 6 (ref 5.0–8.0)

## 2012-06-21 LAB — URINE MICROSCOPIC-ADD ON

## 2012-06-21 NOTE — ED Notes (Signed)
Pt c/o lower abd pain with vaginal discharge; pt sts recently treated for PID; pt sts HA x 3 days also

## 2012-06-21 NOTE — ED Notes (Signed)
[address] Rosalia Hammers with pelvic exam.

## 2012-06-21 NOTE — ED Notes (Signed)
Friend in room at bedside

## 2012-06-21 NOTE — ED Notes (Signed)
NAD noted at time of d/c home with friend 

## 2012-06-21 NOTE — ED Provider Notes (Addendum)
History     CSN: [id number]  Arrival date & time 06/21/12  1243   First MD Initiated Contact with Patient 06/21/12 1252      Chief Complaint  Patient presents with  . Abdominal Pain  . Vaginal Discharge  . Headache    (Consider location/radiation/quality/duration/timing/severity/associated sxs/prior treatment) HPI  Patient complaining of back pain lower back suprapubic began two days ago.  Patient with similar symptoms 3.5 weeks ago and seen tested for stds, treated, std negative, seen with same treatment 2/20 symptoms improved.  States symptoms resolved then returned unsure if partner treated.  Pain is constant, vaginal discharge, no dysuria, frequency with pressure.  G2p1a1.  No current birth control.    Past Medical History  Diagnosis Date  . STD (female)     hx of chlamydia and gonorrhea  . History of PID   . Endometriosis   . Glaucoma(365)   . Ovarian cyst     Past Surgical History  Procedure Laterality Date  . Fracture surgery      left leg  . Dilation and curettage of uterus    . Laparoscopy      Family History  Problem Relation Age of Onset  . Anesthesia problems Neg Hx   . Hypotension Neg Hx   . Malignant hyperthermia Neg Hx   . Pseudochol deficiency Neg Hx   . Alcohol abuse Neg Hx     History  Substance Use Topics  . Smoking status: Current Every Day Smoker -- 0.50 packs/day    Types: Cigarettes  . Smokeless tobacco: Not on file  . Alcohol Use: No     Comment: Denies ETOH use    OB History   Grav Para Term Preterm Abortions TAB SAB Ect Mult Living   3 1 1  0 2 0 2 0 0 1      Review of Systems  All other systems reviewed and are negative.    Allergies  Darvocet; Doxycycline; Flexeril; Metoclopramide; Naproxen; Latex; and Tramadol  Home Medications   Current Outpatient Rx  Name  Route  Sig  Dispense  Refill  . cefixime (SUPRAX) 400 MG tablet   Oral   Take 1 tablet (400 mg total) by mouth daily.   10 tablet   0   . metroNIDAZOLE  (FLAGYL) 500 MG tablet   Oral   Take 1 tablet (500 mg total) by mouth 2 (two) times daily. One po bid x 7 days   14 tablet   0   . oxyCODONE-acetaminophen (PERCOCET) 5-325 MG per tablet   Oral   Take 1 tablet by mouth every 4 (four) hours as needed for pain.   20 tablet   0   . promethazine (PHENERGAN) 25 MG tablet   Oral   Take 1 tablet (25 mg total) by mouth every 6 (six) hours as needed for nausea.   20 tablet   0     BP 134/93  Pulse 95  Temp(Src) 98.1 F (36.7 C) (Oral)  Resp 16  SpO2 99%  LMP 05/11/2012  Physical Exam  Nursing note and vitals reviewed. Constitutional: She appears well-developed and well-nourished.  HENT:  Head: Normocephalic and atraumatic.  Eyes: Conjunctivae and EOM are normal. Pupils are equal, round, and reactive to light.  Neck: Normal range of motion. Neck supple.  Cardiovascular: Normal rate, regular rhythm, normal heart sounds and intact distal pulses.   Pulmonary/Chest: Effort normal and breath sounds normal.  Abdominal: Soft. Bowel sounds are normal.  Genitourinary: Vagina normal.  Musculoskeletal: Normal range of motion.  Neurological: She is alert.  Skin: Skin is warm and dry.  Psychiatric: She has a normal mood and affect. Thought content normal.    ED Course  Procedures (including critical care time)  Labs Reviewed - No data to display No results found.   No diagnosis found.    MDM  Patient with history of endometriosis.  States laparoscopy for same.  No bcp.  Patient tested for stds x 2 in past two weeks.  Ultrasound report 2/9  TRANSABDOMINAL AND TRANSVAGINAL ULTRASOUND OF PELVIS  Technique: Both transabdominal and transvaginal ultrasound  examinations of the pelvis were performed. Transabdominal technique  was performed for global imaging of the pelvis including uterus,  ovaries, adnexal regions, and pelvic cul-de-sac.  It was necessary to proceed with endovaginal exam following the  transabdominal exam to  visualize the uterus and ovaries in greater  detail.  Comparison: None  Findings:  Uterus: Normal in size and appearance; measures 7.8 x 3.6 x 4.4 cm.  Endometrium: Normal in thickness and appearance; measures 0.9 cm in  thickness.  Right ovary: A mildly complex 1.9 cm cyst at the right ovary could  reflect a small hemorrhagic cyst. The right ovary is otherwise  unremarkable in appearance. It measures 4.9 x 2.3 x 2.8 cm.  Left ovary: Normal appearance/no adnexal mass; measures 4.3 x 2.1 x  2.1 cm.  Other findings: A trace amount of free fluid within the pelvic cul-  de-sac is likely physiologic in nature.  Limited Doppler evaluation demonstrates normal color Doppler blood  flow with respect to both ovaries; there is no evidence for ovarian  torsion.  Peristalsing bowel is noted at the right adnexa.  IMPRESSION:  1. No evidence for tubo-ovarian abscess; the left adnexa is  unremarkable in appearance.  2. No evidence for ovarian torsion.  3. Question of small 1.9 cm right-sided hemorrhagic cyst; right  ovary otherwise unremarkable.  4. Uterus unremarkable in appearance.  Original Report Authenticated By: Tonia Ghent, M.D.             External Result Report    External Result Report            Imaging    Imaging Information            Signed by    Signed Date/Time   Phone Pager   [place], JEFFREY 06/03/2012 11:06 PM EST [id number] [phone]         Exam Information    Status Exam Begun   Exam Ended     Final [99] 06/03/2012 8:38 PM EST 06/03/2012 10:58 PM EST                     Original Order    Ordered On      There is no evidence that this is a sexually transmitted disease although she does have pelvic pain she has been treated twice and tested twice. Although it is unclear her sexual partner has been tested and treated and she's advised to followup on this. She is been seen in the past for endometriosis and had laparoscopic  surgery. She's advised return to the gynecologist. She's we referred to gynecology as she is unable to get back into her prior gynecologist.  Hilario Quarry, MD 06/21/12 1408  Hilario Quarry, MD 07/25/12 [phone]

## 2012-07-03 ENCOUNTER — Emergency Department (HOSPITAL_COMMUNITY)
Admission: EM | Admit: 2012-07-03 | Discharge: 2012-07-04 | Disposition: A | Discharge status: Home / Self Care | Payer: Self-pay | Attending: Emergency Medicine | Admitting: Emergency Medicine

## 2012-07-03 ENCOUNTER — Encounter (HOSPITAL_COMMUNITY): Payer: Self-pay | Admitting: Emergency Medicine

## 2012-07-03 DIAGNOSIS — N809 Endometriosis, unspecified: Secondary | ICD-10-CM | POA: Insufficient documentation

## 2012-07-03 DIAGNOSIS — H409 Unspecified glaucoma: Secondary | ICD-10-CM | POA: Insufficient documentation

## 2012-07-03 DIAGNOSIS — Z3202 Encounter for pregnancy test, result negative: Secondary | ICD-10-CM | POA: Insufficient documentation

## 2012-07-03 DIAGNOSIS — F17200 Nicotine dependence, unspecified, uncomplicated: Secondary | ICD-10-CM | POA: Insufficient documentation

## 2012-07-03 DIAGNOSIS — N938 Other specified abnormal uterine and vaginal bleeding: Secondary | ICD-10-CM | POA: Insufficient documentation

## 2012-07-03 DIAGNOSIS — Z8619 Personal history of other infectious and parasitic diseases: Secondary | ICD-10-CM | POA: Insufficient documentation

## 2012-07-03 DIAGNOSIS — Z8742 Personal history of other diseases of the female genital tract: Secondary | ICD-10-CM | POA: Insufficient documentation

## 2012-07-03 DIAGNOSIS — R109 Unspecified abdominal pain: Secondary | ICD-10-CM | POA: Insufficient documentation

## 2012-07-03 DIAGNOSIS — M545 Low back pain, unspecified: Secondary | ICD-10-CM | POA: Insufficient documentation

## 2012-07-03 LAB — CBC WITH DIFFERENTIAL/PLATELET
Basophils Absolute: 0 10*3/uL (ref 0.0–0.1)
Basophils Relative: 0 % (ref 0–1)
Eosinophils Absolute: 0.2 10*3/uL (ref 0.0–0.7)
Eosinophils Relative: 2 % (ref 0–5)
MCH: 30.3 pg (ref 26.0–34.0)
MCHC: 35.3 g/dL (ref 30.0–36.0)
MCV: 86 fL (ref 78.0–100.0)
Platelets: 398 10*3/uL (ref 150–400)
RDW: 14 % (ref 11.5–15.5)
WBC: 8.8 10*3/uL (ref 4.0–10.5)

## 2012-07-03 LAB — URINALYSIS, ROUTINE W REFLEX MICROSCOPIC
Bilirubin Urine: NEGATIVE
Ketones, ur: NEGATIVE mg/dL
Protein, ur: 30 mg/dL — AB
Urobilinogen, UA: 1 mg/dL (ref 0.0–1.0)

## 2012-07-03 LAB — COMPREHENSIVE METABOLIC PANEL
ALT: 11 U/L (ref 0–35)
AST: 14 U/L (ref 0–37)
Calcium: 8.9 mg/dL (ref 8.4–10.5)
Creatinine, Ser: 0.72 mg/dL (ref 0.50–1.10)
Sodium: 134 mEq/L — ABNORMAL LOW (ref 135–145)
Total Protein: 7.4 g/dL (ref 6.0–8.3)

## 2012-07-03 LAB — WET PREP, GENITAL
Clue Cells Wet Prep HPF POC: NONE SEEN
Yeast Wet Prep HPF POC: NONE SEEN

## 2012-07-03 LAB — URINE MICROSCOPIC-ADD ON

## 2012-07-03 MED ORDER — MORPHINE SULFATE 4 MG/ML IJ SOLN
4.0000 mg | Freq: Once | INTRAMUSCULAR | Status: AC
  Administered 2012-07-03: 4 mg via INTRAVENOUS
  Filled 2012-07-03: qty 1

## 2012-07-03 MED ORDER — MORPHINE SULFATE 2 MG/ML IJ SOLN: 1.0000 mg | Freq: Once | INTRAMUSCULAR | Status: DC

## 2012-07-03 MED ORDER — SODIUM CHLORIDE 0.9 % IV SOLN
Freq: Once | INTRAVENOUS | Status: AC
  Administered 2012-07-03: 23:00:00 via INTRAVENOUS

## 2012-07-03 NOTE — ED Notes (Signed)
States that she started bleeding this am and thought she could be pregnant. States that she has a tremendous amount of pressure. States that she also has pain that originates in her back and centers in her abd. States that she is passing some clots also states that she has used 2 maxi pads since this am.

## 2012-07-03 NOTE — ED Provider Notes (Signed)
History     CSN: [id number]  Arrival date & time 07/03/12  1724   First MD Initiated Contact with Patient 07/03/12 2101      Chief Complaint  Patient presents with  . Vaginal Bleeding    (Consider location/radiation/quality/duration/timing/severity/associated sxs/prior treatment) HPI Comments: Patient is a 27 y/o F, with PMHx of endometriosis and ovarian cysts, c/o vaginal bleeding x 1 day. Patient reported that bleeding started today (07/03/2012) at approximately 1:00pm. Patient described bleeding being a mixture of bright red and dark red blood, with passage of clots. Patient reported pelvic and back pain that radiates to thighs bilaterally, patient reported pain being 10/10. Patient reported that symptoms are similar to miscarriage symptoms - patient has had two miscarriages, 2009 and 2012. Patient is unable to recall last menstrual period. Patient denied history of STDs., ectopic pregnancies. Denied chest pain, shortness of breath, difficulty breathing, genital lesions/ sores, dysuria.    Patient reported being on Depo-provera injections - discontinued approximately one year ago. Patient reported due to endometriosis that periods are never the same, but follows up with gynocologist.   Patient has had D&C after miscarriage and laparoscopy from endometriosis.  Patient reported that mother had history of miscarriages.   Patient reported that pregnancy with daughter went well, with no complaints, was full term, without problem postpartum.   Patient denied Family History of uterine cancer, ovarian cancer, complications during pregnancy, endometriosis, vaginal cancer, cervical cancer.    The history is provided by the patient.    Past Medical History  Diagnosis Date  . STD (female)     hx of chlamydia and gonorrhea  . History of PID   . Endometriosis   . Glaucoma(365)   . Ovarian cyst     Past Surgical History  Procedure Laterality Date  . Fracture surgery      left leg  .  Dilation and curettage of uterus    . Laparoscopy      Family History  Problem Relation Age of Onset  . Anesthesia problems Neg Hx   . Hypotension Neg Hx   . Malignant hyperthermia Neg Hx   . Pseudochol deficiency Neg Hx   . Alcohol abuse Neg Hx     History  Substance Use Topics  . Smoking status: Current Every Day Smoker -- 0.50 packs/day    Types: Cigarettes  . Smokeless tobacco: Not on file  . Alcohol Use: No     Comment: Denies ETOH use    OB History   Grav Para Term Preterm Abortions TAB SAB Ect Mult Living   3 1 1  0 2 0 2 0 0 1      Review of Systems  Respiratory: Negative for chest tightness and shortness of breath.   Genitourinary: Positive for vaginal bleeding and pelvic pain.  Musculoskeletal: Positive for back pain.  Neurological: Negative for dizziness.  All other systems reviewed and are negative.    Allergies  Acyclovir and related; Darvocet; Doxycycline; Flexeril; Metoclopramide; Naproxen; Latex; and Tramadol  Home Medications   Current Outpatient Rx  Name  Route  Sig  Dispense  Refill  . ibuprofen (ADVIL,MOTRIN) 200 MG tablet   Oral   Take 600 mg by mouth every 6 (six) hours as needed for pain or fever.         Marland Kitchen HYDROcodone-acetaminophen (HYCET) 7.5-325 mg/15 ml solution   Oral   Take 7.5 mLs by mouth every 8 (eight) hours as needed for pain.   45 mL   0  BP 117/72  Pulse 76  Temp(Src) 97.8 F (36.6 C) (Oral)  Resp 18  SpO2 99%  LMP 05/30/2012  Physical Exam  Nursing note and vitals reviewed. Constitutional: She appears well-developed and well-nourished. No distress.  Cardiovascular: Normal rate, regular rhythm, normal heart sounds and intact distal pulses.   No murmur heard. Pulmonary/Chest: Effort normal and breath sounds normal.  Abdominal: Soft. Bowel sounds are normal. She exhibits no distension and no mass. There is tenderness. There is no rebound and no guarding.  Tenderness to lower abomen  Genitourinary: Vagina  normal and uterus normal.  Pelvic exam: No lesions, inflammation, swelling, sores to external genitalia. Cervix without inflammation, ulcers, nodules. Cervix anteroverted upon palpation. No nodules, lesions, sores to vaginal walls. Tenderness to vaginal walls and cervix upon palpation. Positive bleeding from cervical os.   Skin: She is not diaphoretic.    ED Course  Procedures (including critical care time)  Labs Reviewed  WET PREP, GENITAL - Abnormal; Notable for the following:    WBC, Wet Prep HPF POC FEW (*)    All other components within normal limits  URINALYSIS, ROUTINE W REFLEX MICROSCOPIC - Abnormal; Notable for the following:    Color, Urine RED (*)    APPearance CLOUDY (*)    Hgb urine dipstick LARGE (*)    Protein, ur 30 (*)    Leukocytes, UA MODERATE (*)    All other components within normal limits  COMPREHENSIVE METABOLIC PANEL - Abnormal; Notable for the following:    Sodium 134 (*)    All other components within normal limits  URINE MICROSCOPIC-ADD ON - Abnormal; Notable for the following:    Bacteria, UA FEW (*)    All other components within normal limits  GC/CHLAMYDIA PROBE AMP  URINE CULTURE  CBC WITH DIFFERENTIAL  POCT PREGNANCY, URINE  Blood pressure 117/72, pulse 76, temperature 97.8 F (36.6 C), temperature source Oral, resp. rate 18, last menstrual period 05/30/2012, SpO2 99.00%.   US Transvaginal Non-ob  07/04/2012  *RADIOLOGY REPORT*  Clinical Data: Vaginal bleeding, endometriosis history  TRANSABDOMINAL AND TRANSVAGINAL ULTRASOUND OF PELVIS  Technique:  Both transabdominal and transvaginal ultrasound examinations of the pelvis were performed. Transabdominal technique was performed for global imaging of the pelvis including uterus, ovaries, adnexal regions, and pelvic cul-de-sac.  It was necessary to proceed with endovaginal exam following the transabdominal exam to visualize the endometrium and adnexa.  Comparison:  06/03/2012  Findings:  Uterus: Normal in  size and appearance, measuring 7.6 x 4.0 x 4.0 cm.  The patient demonstrated exquisite cervical tenderness during the examination.  Endometrium: Normal in thickness and appearance, measuring 3 mm.  Right ovary:  Normal appearance/no adnexal mass, measuring 4.3 x 2.1 x 2.1 cm.  Left ovary: Normal appearance/no adnexal mass, measuring 4.1 x 2.53-0.3 cm.  Other findings: No free fluid  IMPRESSION:  No sonographic abnormality identified.  Exquisite cervical tenderness elicited during the examination. Correlate clinically for pelvic inflammatory disease.   Original Report Authenticated By: Jearld Lesch, M.D.    US Pelvis Complete  07/04/2012  *RADIOLOGY REPORT*  Clinical Data: Vaginal bleeding, endometriosis history  TRANSABDOMINAL AND TRANSVAGINAL ULTRASOUND OF PELVIS  Technique:  Both transabdominal and transvaginal ultrasound examinations of the pelvis were performed. Transabdominal technique was performed for global imaging of the pelvis including uterus, ovaries, adnexal regions, and pelvic cul-de-sac.  It was necessary to proceed with endovaginal exam following the transabdominal exam to visualize the endometrium and adnexa.  Comparison:  06/03/2012  Findings:  Uterus: Normal  in size and appearance, measuring 7.6 x 4.0 x 4.0 cm.  The patient demonstrated exquisite cervical tenderness during the examination.  Endometrium: Normal in thickness and appearance, measuring 3 mm.  Right ovary:  Normal appearance/no adnexal mass, measuring 4.3 x 2.1 x 2.1 cm.  Left ovary: Normal appearance/no adnexal mass, measuring 4.1 x 2.53-0.3 cm.  Other findings: No free fluid  IMPRESSION:  No sonographic abnormality identified.  Exquisite cervical tenderness elicited during the examination. Correlate clinically for pelvic inflammatory disease.   Original Report Authenticated By: Jearld Lesch, M.D.    Urinalysis    Component Value Date/Time   COLORURINE RED* 07/03/2012 2128   APPEARANCEUR CLOUDY* 07/03/2012 2128    LABSPEC 1.021 07/03/2012 2128   PHURINE 5.5 07/03/2012 2128   GLUCOSEU NEGATIVE 07/03/2012 2128   HGBUR LARGE* 07/03/2012 2128   BILIRUBINUR NEGATIVE 07/03/2012 2128   KETONESUR NEGATIVE 07/03/2012 2128   PROTEINUR 30* 07/03/2012 2128   UROBILINOGEN 1.0 07/03/2012 2128   NITRITE NEGATIVE 07/03/2012 2128   LEUKOCYTESUR MODERATE* 07/03/2012 2128    . CBC    Component Value Date/Time   WBC 8.8 07/03/2012 1829   RBC 4.42 07/03/2012 1829   HGB 13.4 07/03/2012 1829   HCT 38.0 07/03/2012 1829   PLT 398 07/03/2012 1829   MCV 86.0 07/03/2012 1829   MCH 30.3 07/03/2012 1829   MCHC 35.3 07/03/2012 1829   RDW 14.0 07/03/2012 1829   LYMPHSABS 3.2 07/03/2012 1829   MONOABS 0.7 07/03/2012 1829   EOSABS 0.2 07/03/2012 1829   BASOSABS 0.0 07/03/2012 1829    1. Vaginal bleeding       MDM  Patient is a 27 y/o F with PMHx of endometriosis, c/o vaginal bleeding with pelvic and back pain x 1 day.   [id number] - miscarriage in 2009 and 2012 Patient has hx of dilation and curettage of uterus for miscarriage Patient has hx of laparoscopy for endometriosis Patient is sexually active with same partner for the past 6 months   Morphine 4mg  given IM for pain relief Morhpine 4mg  IV given for pain relief  Urine pregnancy order to r/o pregnancy, possible miscarriage CBC ordered to r/o anemia from bleeding CMP ordered to r/o electrolyte abnormality from bleeding GC/Chlamydia and wet prep ordered to r/o STDs. Transvaginal ultrasound ordered for possible mass, ovarian torsion  UA - red color to urine with large Hgb due to vaginal bleeding, few bacteria found - Urine culture in process Wet prep - negative, no trichomonas  CMP - Sodium low (134) - patient on 0.45% NaCl IV CBC - within normal limits, r/o infection  Urine Pregnancy test - negative  GC/Chlamydia in process  Transvaginal ultrasound- no ovarian torsion, no abscesses, ovaries are unremarkable.   Suspected diagnosis is vaginal bleeding due to endometriosis  complication.  Explained to patient the need to follow-up consistently with GYN physician and recommened possibly starting Depo-provera injections again.   Patient afebrile, normotensive, non-tachycardic, with moderate pain relief, alert. Discussed with patient the need to follow up with [facility] today (07/04/2012), patient understood. Discussed with patient the need to continue to stay compliant with medication and GYN since has history of cysts and endometriosis. Discharged patient with Hycet 45ml suspension, instructed patient to take 7.34ml PO q8h prn for pain relief. Discussed with patient if symptoms are to worsen to return to the ED.   Patient agreed to plan of care, understood, and all questions answered.  Raymon Mutton, PA-C 07/04/12 0710

## 2012-07-04 ENCOUNTER — Emergency Department (HOSPITAL_COMMUNITY): Payer: Self-pay

## 2012-07-04 LAB — GC/CHLAMYDIA PROBE AMP
CT Probe RNA: NEGATIVE
GC Probe RNA: NEGATIVE

## 2012-07-04 MED ORDER — MORPHINE SULFATE 4 MG/ML IJ SOLN
INTRAMUSCULAR | Status: AC
  Filled 2012-07-04: qty 1

## 2012-07-04 MED ORDER — IBUPROFEN 600 MG PO TABS: 600.0000 mg | ORAL_TABLET | Freq: Four times a day (QID) | ORAL | Status: DC | PRN

## 2012-07-04 MED ORDER — HYDROCODONE-ACETAMINOPHEN 7.5-325 MG/15ML PO SOLN: 7.5000 mL | Freq: Three times a day (TID) | ORAL | Status: DC | PRN

## 2012-07-04 MED ORDER — MORPHINE SULFATE 4 MG/ML IJ SOLN
4.0000 mg | Freq: Once | INTRAMUSCULAR | Status: AC
  Administered 2012-07-04: 4 mg via INTRAVENOUS

## 2012-07-04 NOTE — Discharge Instructions (Signed)
[facility] follow [facility]07[facility]04/2012) - please follow up with the OBGYN. Please take Motrin 600mg  one tab by mouth every six hours as when needed for pain.  If symptoms are to worsen please report back to the ED.

## 2012-07-06 LAB — URINE CULTURE

## 2012-07-06 NOTE — ED Provider Notes (Signed)
Medical screening examination/treatment/procedure(s) were performed by non-physician practitioner and as supervising physician I was immediately available for consultation/collaboration.  Sunnie Nielsen, MD 07/06/12 1006

## 2012-07-07 ENCOUNTER — Telehealth (HOSPITAL_COMMUNITY): Payer: Self-pay | Admitting: Emergency Medicine

## 2012-07-07 NOTE — ED Notes (Signed)
Patient has +Urine culture. Checking to see if appropriately treated. °

## 2012-07-07 NOTE — ED Notes (Signed)
+   Urine Chart sent to EDP office for review. 

## 2012-07-08 ENCOUNTER — Telehealth (HOSPITAL_COMMUNITY): Payer: Self-pay | Admitting: Emergency Medicine

## 2012-07-08 NOTE — ED Notes (Signed)
Rx called in to [facility] on [address] by [employer] [facility].

## 2012-07-08 NOTE — ED Notes (Signed)
Chart returned from EDP office. Per Marlon Pel PA-C, Ciprofloxacin 500 mg tablet. 1 tab PO BID for 7 days. Disp #14.

## 2012-09-03 ENCOUNTER — Encounter (HOSPITAL_COMMUNITY): Payer: Self-pay | Admitting: Emergency Medicine

## 2012-09-03 ENCOUNTER — Emergency Department (HOSPITAL_COMMUNITY)
Admission: EM | Admit: 2012-09-03 | Discharge: 2012-09-03 | Discharge status: Against Medical Advice | Payer: Self-pay | Attending: Emergency Medicine | Admitting: Emergency Medicine

## 2012-09-03 DIAGNOSIS — R109 Unspecified abdominal pain: Secondary | ICD-10-CM | POA: Insufficient documentation

## 2012-09-03 DIAGNOSIS — N898 Other specified noninflammatory disorders of vagina: Secondary | ICD-10-CM | POA: Insufficient documentation

## 2012-09-03 DIAGNOSIS — F17200 Nicotine dependence, unspecified, uncomplicated: Secondary | ICD-10-CM | POA: Insufficient documentation

## 2012-09-03 NOTE — ED Notes (Signed)
Pt sts unable to void at present

## 2012-09-03 NOTE — ED Notes (Signed)
No answer x1

## 2012-09-03 NOTE — ED Notes (Signed)
Pt c/o lower abd pain with brown vaginal discharge with odor; pt unsure of last LMP

## 2012-09-03 NOTE — ED Notes (Signed)
Pt did not answer when called  x2 

## 2012-09-04 ENCOUNTER — Encounter (HOSPITAL_COMMUNITY): Payer: Self-pay | Admitting: *Deleted

## 2012-09-04 ENCOUNTER — Emergency Department (HOSPITAL_COMMUNITY)
Admission: EM | Admit: 2012-09-04 | Discharge: 2012-09-04 | Disposition: A | Discharge status: Home / Self Care | Payer: Self-pay | Attending: Emergency Medicine | Admitting: Emergency Medicine

## 2012-09-04 DIAGNOSIS — R350 Frequency of micturition: Secondary | ICD-10-CM | POA: Insufficient documentation

## 2012-09-04 DIAGNOSIS — R112 Nausea with vomiting, unspecified: Secondary | ICD-10-CM | POA: Insufficient documentation

## 2012-09-04 DIAGNOSIS — Z3202 Encounter for pregnancy test, result negative: Secondary | ICD-10-CM | POA: Insufficient documentation

## 2012-09-04 DIAGNOSIS — N390 Urinary tract infection, site not specified: Secondary | ICD-10-CM | POA: Insufficient documentation

## 2012-09-04 DIAGNOSIS — Z8742 Personal history of other diseases of the female genital tract: Secondary | ICD-10-CM | POA: Insufficient documentation

## 2012-09-04 DIAGNOSIS — R300 Dysuria: Secondary | ICD-10-CM | POA: Insufficient documentation

## 2012-09-04 DIAGNOSIS — N898 Other specified noninflammatory disorders of vagina: Secondary | ICD-10-CM | POA: Insufficient documentation

## 2012-09-04 DIAGNOSIS — F17200 Nicotine dependence, unspecified, uncomplicated: Secondary | ICD-10-CM | POA: Insufficient documentation

## 2012-09-04 DIAGNOSIS — N72 Inflammatory disease of cervix uteri: Secondary | ICD-10-CM | POA: Insufficient documentation

## 2012-09-04 DIAGNOSIS — Z8619 Personal history of other infectious and parasitic diseases: Secondary | ICD-10-CM | POA: Insufficient documentation

## 2012-09-04 DIAGNOSIS — H409 Unspecified glaucoma: Secondary | ICD-10-CM | POA: Insufficient documentation

## 2012-09-04 DIAGNOSIS — N760 Acute vaginitis: Secondary | ICD-10-CM | POA: Insufficient documentation

## 2012-09-04 LAB — WET PREP, GENITAL
Trich, Wet Prep: NONE SEEN
Yeast Wet Prep HPF POC: NONE SEEN

## 2012-09-04 LAB — URINE MICROSCOPIC-ADD ON

## 2012-09-04 LAB — URINALYSIS, ROUTINE W REFLEX MICROSCOPIC
Bilirubin Urine: NEGATIVE
Ketones, ur: NEGATIVE mg/dL
Nitrite: NEGATIVE
Specific Gravity, Urine: 1.015 (ref 1.005–1.030)
pH: 6 (ref 5.0–8.0)

## 2012-09-04 LAB — POCT PREGNANCY, URINE: Preg Test, Ur: NEGATIVE

## 2012-09-04 MED ORDER — ACETAMINOPHEN 325 MG PO TABS
650.0000 mg | ORAL_TABLET | Freq: Once | ORAL | Status: AC
  Administered 2012-09-04: 650 mg via ORAL
  Filled 2012-09-04: qty 2

## 2012-09-04 MED ORDER — AZITHROMYCIN 250 MG PO TABS
1000.0000 mg | ORAL_TABLET | Freq: Once | ORAL | Status: AC
Start: 2012-09-04 — End: 2012-09-04
  Administered 2012-09-04: 1000 mg via ORAL
  Filled 2012-09-04: qty 4

## 2012-09-04 MED ORDER — ONDANSETRON 4 MG PO TBDP
4.0000 mg | ORAL_TABLET | Freq: Once | ORAL | Status: AC
  Administered 2012-09-04: 4 mg via ORAL
  Filled 2012-09-04: qty 1

## 2012-09-04 MED ORDER — CEPHALEXIN 500 MG PO CAPS: 500.0000 mg | ORAL_CAPSULE | Freq: Two times a day (BID) | ORAL | Status: DC

## 2012-09-04 MED ORDER — OXYCODONE-ACETAMINOPHEN 5-325 MG PO TABS: ORAL_TABLET | ORAL | Status: DC

## 2012-09-04 MED ORDER — METRONIDAZOLE 500 MG PO TABS: 500.0000 mg | ORAL_TABLET | Freq: Two times a day (BID) | ORAL | Status: DC

## 2012-09-04 MED ORDER — CEFTRIAXONE SODIUM 1 G IJ SOLR
1.0000 g | Freq: Once | INTRAMUSCULAR | Status: AC
  Administered 2012-09-04: 1 g via INTRAMUSCULAR
  Filled 2012-09-04: qty 10

## 2012-09-04 MED ORDER — OXYCODONE-ACETAMINOPHEN 5-325 MG PO TABS
2.0000 | ORAL_TABLET | Freq: Once | ORAL | Status: AC
  Administered 2012-09-04: 2 via ORAL
  Filled 2012-09-04: qty 2

## 2012-09-04 MED ORDER — METRONIDAZOLE 500 MG PO TABS
500.0000 mg | ORAL_TABLET | Freq: Once | ORAL | Status: AC
  Administered 2012-09-04: 500 mg via ORAL
  Filled 2012-09-04: qty 1

## 2012-09-04 NOTE — ED Notes (Signed)
POCT pregnancy ran by [place] [facility], was documented in mini lab but did not result in epic. Pregnancy was negative. Joni Reining, PA notified.

## 2012-09-04 NOTE — ED Provider Notes (Signed)
History     CSN: [id number]  Arrival date & time 09/04/12  0917   First MD Initiated Contact with Patient 09/04/12 0935      Chief Complaint  Patient presents with  . Urinary Frequency  . Dysuria  . Nausea  . Migraine    (Consider location/radiation/quality/duration/timing/severity/associated sxs/prior treatment) HPI  [name] is a 27 y.o. female with past medical history significant for PID and endometriosis complaining of pelvic pain worsening over the course of 3 days associated with dysuria, abnormal vaginal discharge (white non-foul smelling), urinary frequency and exacerbation of chronic headache. Patient denies fever, nausea vomiting, change in bowel or bladder habits. Patient has not been to see a gynecologist in several months. Patient has had unprotected sex x1 week ago.   Past Medical History  Diagnosis Date  . STD (female)     hx of chlamydia and gonorrhea  . History of PID   . Endometriosis   . Glaucoma(365)   . Ovarian cyst     Past Surgical History  Procedure Laterality Date  . Fracture surgery      left leg  . Dilation and curettage of uterus    . Laparoscopy      Family History  Problem Relation Age of Onset  . Anesthesia problems Neg Hx   . Hypotension Neg Hx   . Malignant hyperthermia Neg Hx   . Pseudochol deficiency Neg Hx   . Alcohol abuse Neg Hx     History  Substance Use Topics  . Smoking status: Current Every Day Smoker -- 0.50 packs/day    Types: Cigarettes  . Smokeless tobacco: Not on file  . Alcohol Use: No     Comment: Denies ETOH use    OB History   Grav Para Term Preterm Abortions TAB SAB Ect Mult Living   3 1 1  0 2 0 2 0 0 1      Review of Systems  Constitutional: Negative for fever.  Respiratory: Negative for shortness of breath.   Cardiovascular: Negative for chest pain.  Gastrointestinal: Negative for nausea, vomiting, abdominal pain and diarrhea.  Genitourinary: Positive for dysuria, vaginal discharge and  pelvic pain.  Neurological: Positive for headaches.  All other systems reviewed and are negative.    Allergies  Acyclovir and related; Darvocet; Doxycycline; Flexeril; Metoclopramide; Naproxen; Latex; and Tramadol  Home Medications   Current Outpatient Rx  Name  Route  Sig  Dispense  Refill  . HYDROcodone-acetaminophen (HYCET) 7.5-325 mg/15 ml solution   Oral   Take 7.5 mLs by mouth every 8 (eight) hours as needed for pain.   45 mL   0   . ibuprofen (ADVIL,MOTRIN) 200 MG tablet   Oral   Take 600 mg by mouth every 6 (six) hours as needed for pain or fever.           BP 117/67  Pulse 64  Temp(Src) 98.1 F (36.7 C) (Oral)  Resp 16  Ht 5\' 4"  (1.626 m)  Wt 150 lb (68.04 kg)  BMI 25.73 kg/m2  SpO2 98%  LMP 08/15/2012  Physical Exam  Nursing note and vitals reviewed. Constitutional: She is oriented to person, place, and time. She appears well-developed and well-nourished. No distress.  Afebrile, nontoxic  HENT:  Head: Normocephalic.  Mouth/Throat: Oropharynx is clear and moist.  Eyes: Conjunctivae and EOM are normal. Pupils are equal, round, and reactive to light.  Cardiovascular: Normal rate.   Pulmonary/Chest: Effort normal and breath sounds normal. No stridor. No respiratory  distress. She has no wheezes. She has no rales. She exhibits no tenderness.  Abdominal: Soft. Bowel sounds are normal. She exhibits no distension. There is tenderness.  Mild tenderness to palpation of suprapubic area with no guarding or rebound  Genitourinary:  No CVA tenderness bilaterally  Pelvic exam chaperoned by tech:  No rashes or lesions  Patient has a thick white non-foul-smelling vaginal discharge there is no cervical motion or adnexal tenderness.  Musculoskeletal: Normal range of motion.  Neurological: She is alert and oriented to person, place, and time.  Follows commands, Goal oriented speech, Strength is 5 out of 5x4 extremities, patient ambulates with a coordinated in  nonantalgic gait. Sensation is grossly intact.  Psychiatric: She has a normal mood and affect.    ED Course  Procedures (including critical care time)  Labs Reviewed  WET PREP, GENITAL - Abnormal; Notable for the following:    Clue Cells Wet Prep HPF POC MODERATE (*)    WBC, Wet Prep HPF POC TOO NUMEROUS TO COUNT (*)    All other components within normal limits  URINALYSIS, ROUTINE W REFLEX MICROSCOPIC - Abnormal; Notable for the following:    APPearance CLOUDY (*)    Hgb urine dipstick TRACE (*)    Leukocytes, UA LARGE (*)    All other components within normal limits  URINE MICROSCOPIC-ADD ON - Abnormal; Notable for the following:    Bacteria, UA FEW (*)    All other components within normal limits  GC/CHLAMYDIA PROBE AMP  URINE CULTURE  POCT PREGNANCY, URINE      Verbal report that urine pregnancy test is negative.  No results found.   1. Cervicitis   2. BV (bacterial vaginosis)   3. UTI (lower urinary tract infection)       MDM   Filed Vitals:   09/04/12 0930 09/04/12 1418  BP: 117/67   Pulse: 64 67  Temp: 98.1 F (36.7 C)   TempSrc: Oral   Resp: 16 18  Height: 5\' 4"  (1.626 m)   Weight: 150 lb (68.04 kg)   SpO2: 98% 100%     [name] is a 27 y.o. female of lower abdominal discomfort, dysuria, frequency abnormal vaginal discharge. Patient has nonsurgical abdominal exam. Wet prep is consistent with bacterial vaginosis and cervicitis. Additionally urinalysis is consistent with urinary tract infection. I've advised patient that it is very important that she follow with OB/GYN if she has multiple GYN complaints, I have also asked her to follow with the Department of Health for full STD screening. We have discussed the importance of refraining from her having protected sex and since she has a full STD screen.  Medications  acetaminophen (TYLENOL) tablet 650 mg (650 mg Oral Given 09/04/12 1006)  ondansetron (ZOFRAN-ODT) disintegrating tablet 4 mg (4 mg Oral  Given 09/04/12 1006)  cefTRIAXone (ROCEPHIN) injection 1 g (1 g Intramuscular Given 09/04/12 1143)  oxyCODONE-acetaminophen (PERCOCET/ROXICET) 5-325 MG per tablet 2 tablet (2 tablets Oral Given 09/04/12 1222)  azithromycin (ZITHROMAX) tablet 1,000 mg (1,000 mg Oral Given 09/04/12 1415)  metroNIDAZOLE (FLAGYL) tablet 500 mg (500 mg Oral Given 09/04/12 1415)    VSS and patient is appropriate for, and amenable to, discharge at this time. Pt verbalized understanding and agrees with care plan. Outpatient follow-up and return precautions given.    Discharge Medication List as of 09/04/2012  1:32 PM    START taking these medications   Details  cephALEXin (KEFLEX) 500 MG capsule Take 1 capsule (500 mg total) by mouth  2 (two) times daily., Starting 09/04/2012, Until Discontinued, Print    metroNIDAZOLE (FLAGYL) 500 MG tablet Take 1 tablet (500 mg total) by mouth 2 (two) times daily. One tab PO bid x 10 days, Starting 09/04/2012, Until Discontinued, Print    oxyCODONE-acetaminophen (PERCOCET/ROXICET) 5-325 MG per tablet 1 to 2 tabs PO q6hrs  PRN for pain, Print                Wynetta Emery, PA-C 09/05/12 [phone]

## 2012-09-04 NOTE — ED Notes (Signed)
Pt states she has had migraines, nausea, dysuria, urinary frequency and lower abd pain radiating to back x2 weeks. Hx of bladder infections, pt believes she has infection now. Reports lack of sleep d/t chronic headaches

## 2012-09-05 LAB — GC/CHLAMYDIA PROBE AMP: CT Probe RNA: POSITIVE — AB

## 2012-09-06 LAB — URINE CULTURE

## 2012-09-06 NOTE — ED Provider Notes (Signed)
Medical screening examination/treatment/procedure(s) were performed by non-physician practitioner and as supervising physician I was immediately available for consultation/collaboration.  Doug Sou, MD 09/06/12 2008

## 2012-09-07 NOTE — ED Notes (Signed)
+   Chlamydia Patient treated with Rocephin And Zithromax-[facility] faxed Attempt made to contact patient: no answer

## 2012-09-07 NOTE — ED Notes (Signed)
Post ED Visit - Positive Culture Follow-up  Culture report reviewed by antimicrobial stewardship pharmacist: []  Wes Dulaney, Pharm.D., BCPS [x]  Celedonio Miyamoto, Pharm.D., BCPS []  Georgina Pillion, Pharm.D., BCPS []  [place], [address].D., BCPS, AAHIVP []  Estella Husk, Pharm.D., BCPS, AAHIV  Positive urine culture Treated with Cephalexin- organism sensitive to the same and no further patient follow-up is required at this time.  Larena Sox 09/07/2012, 5:16 PM

## 2012-09-08 ENCOUNTER — Telehealth (HOSPITAL_COMMUNITY): Payer: Self-pay | Admitting: Emergency Medicine

## 2012-09-08 NOTE — ED Notes (Signed)
Unable to contact patient via phone. Sent letter. °

## 2012-11-03 ENCOUNTER — Telehealth (HOSPITAL_COMMUNITY): Payer: Self-pay | Admitting: Emergency Medicine

## 2012-11-03 NOTE — ED Notes (Signed)
No response to letter sent after 30 days. Chart sent to Medical Records. °

## 2012-11-18 ENCOUNTER — Emergency Department (HOSPITAL_COMMUNITY)
Admission: EM | Admit: 2012-11-18 | Discharge: 2012-11-18 | Disposition: A | Discharge status: Home / Self Care | Payer: Self-pay | Attending: Emergency Medicine | Admitting: Emergency Medicine

## 2012-11-18 ENCOUNTER — Encounter (HOSPITAL_COMMUNITY): Payer: Self-pay | Admitting: Emergency Medicine

## 2012-11-18 DIAGNOSIS — A499 Bacterial infection, unspecified: Secondary | ICD-10-CM | POA: Insufficient documentation

## 2012-11-18 DIAGNOSIS — R350 Frequency of micturition: Secondary | ICD-10-CM | POA: Insufficient documentation

## 2012-11-18 DIAGNOSIS — Z91040 Latex allergy status: Secondary | ICD-10-CM | POA: Insufficient documentation

## 2012-11-18 DIAGNOSIS — Z8669 Personal history of other diseases of the nervous system and sense organs: Secondary | ICD-10-CM | POA: Insufficient documentation

## 2012-11-18 DIAGNOSIS — Z3202 Encounter for pregnancy test, result negative: Secondary | ICD-10-CM | POA: Insufficient documentation

## 2012-11-18 DIAGNOSIS — N809 Endometriosis, unspecified: Secondary | ICD-10-CM

## 2012-11-18 DIAGNOSIS — F17200 Nicotine dependence, unspecified, uncomplicated: Secondary | ICD-10-CM | POA: Insufficient documentation

## 2012-11-18 DIAGNOSIS — R300 Dysuria: Secondary | ICD-10-CM | POA: Insufficient documentation

## 2012-11-18 DIAGNOSIS — Z8619 Personal history of other infectious and parasitic diseases: Secondary | ICD-10-CM | POA: Insufficient documentation

## 2012-11-18 DIAGNOSIS — N760 Acute vaginitis: Secondary | ICD-10-CM | POA: Insufficient documentation

## 2012-11-18 DIAGNOSIS — B9689 Other specified bacterial agents as the cause of diseases classified elsewhere: Secondary | ICD-10-CM

## 2012-11-18 DIAGNOSIS — N898 Other specified noninflammatory disorders of vagina: Secondary | ICD-10-CM | POA: Insufficient documentation

## 2012-11-18 DIAGNOSIS — IMO0002 Reserved for concepts with insufficient information to code with codable children: Secondary | ICD-10-CM | POA: Insufficient documentation

## 2012-11-18 DIAGNOSIS — Z792 Long term (current) use of antibiotics: Secondary | ICD-10-CM | POA: Insufficient documentation

## 2012-11-18 DIAGNOSIS — R109 Unspecified abdominal pain: Secondary | ICD-10-CM | POA: Insufficient documentation

## 2012-11-18 DIAGNOSIS — Z79899 Other long term (current) drug therapy: Secondary | ICD-10-CM | POA: Insufficient documentation

## 2012-11-18 DIAGNOSIS — Z8742 Personal history of other diseases of the female genital tract: Secondary | ICD-10-CM | POA: Insufficient documentation

## 2012-11-18 LAB — CBC WITH DIFFERENTIAL/PLATELET
Basophils Relative: 0 % (ref 0–1)
HCT: 38.9 % (ref 36.0–46.0)
Hemoglobin: 13.5 g/dL (ref 12.0–15.0)
MCH: 30.1 pg (ref 26.0–34.0)
MCHC: 34.7 g/dL (ref 30.0–36.0)
Monocytes Absolute: 0.5 10*3/uL (ref 0.1–1.0)
Monocytes Relative: 7 % (ref 3–12)
Neutro Abs: 4.6 10*3/uL (ref 1.7–7.7)

## 2012-11-18 LAB — URINALYSIS, ROUTINE W REFLEX MICROSCOPIC
Bilirubin Urine: NEGATIVE
Glucose, UA: NEGATIVE mg/dL
Ketones, ur: NEGATIVE mg/dL
Leukocytes, UA: NEGATIVE
pH: 7.5 (ref 5.0–8.0)

## 2012-11-18 LAB — BASIC METABOLIC PANEL
BUN: 7 mg/dL (ref 6–23)
CO2: 27 mEq/L (ref 19–32)
Chloride: 99 mEq/L (ref 96–112)
Creatinine, Ser: 0.84 mg/dL (ref 0.50–1.10)
GFR calc Af Amer: >90 mL/min (ref >90)
Glucose, Bld: 97 mg/dL (ref 70–99)

## 2012-11-18 LAB — WET PREP, GENITAL: Trich, Wet Prep: NONE SEEN

## 2012-11-18 LAB — POCT PREGNANCY, URINE: Preg Test, Ur: NEGATIVE

## 2012-11-18 MED ORDER — HYDROCODONE-ACETAMINOPHEN 5-325 MG PO TABS
2.0000 | ORAL_TABLET | Freq: Once | ORAL | Status: AC
  Administered 2012-11-18: 2 via ORAL
  Filled 2012-11-18: qty 2

## 2012-11-18 MED ORDER — PROMETHAZINE HCL 25 MG PO TABS: 25.0000 mg | ORAL_TABLET | Freq: Four times a day (QID) | ORAL | Status: DC | PRN

## 2012-11-18 MED ORDER — ONDANSETRON HCL 4 MG/2ML IJ SOLN: 4.0000 mg | Freq: Once | INTRAMUSCULAR | Status: DC

## 2012-11-18 MED ORDER — METRONIDAZOLE 500 MG PO TABS: 500.0000 mg | ORAL_TABLET | Freq: Two times a day (BID) | ORAL | Status: DC

## 2012-11-18 MED ORDER — KETOROLAC TROMETHAMINE 30 MG/ML IJ SOLN: 30.0000 mg | Freq: Once | INTRAMUSCULAR | Status: DC

## 2012-11-18 MED ORDER — ONDANSETRON 4 MG PO TBDP
4.0000 mg | ORAL_TABLET | Freq: Once | ORAL | Status: AC
  Administered 2012-11-18: 4 mg via ORAL
  Filled 2012-11-18: qty 1

## 2012-11-18 MED ORDER — OXYCODONE-ACETAMINOPHEN 5-325 MG PO TABS: 1.0000 | ORAL_TABLET | Freq: Four times a day (QID) | ORAL | Status: DC | PRN

## 2012-11-18 NOTE — ED Provider Notes (Signed)
CSN: [id number]     Arrival date & time 11/18/12  1323 History     First MD Initiated Contact with Patient 11/18/12 1341     Chief Complaint  Patient presents with  . Back Pain   (Consider location/radiation/quality/duration/timing/severity/associated sxs/prior Treatment) HPI Comments: Patient with a history of PID and Endometriosis presents today with lower abdominal pain, back pain, dyspareunia, increased urinary frequency, dysuria, and vaginal discharge.  She reports that her symptoms have been present for the past two days and are gradually worsening.  She states that the pain feels similar to the pain that she has had with her Endometriosis in the past.  She has been taking Ibuprofen and Tylenol for the pain without relief.  She states that Percocet has helped with her pain in the past.  Vaginal discharge is whitish in color and odorous.  She reports that her menstrual periods have been irregular and she is unsure of the date of her last period.  She is currently sexually active and has unprotected sex.  She denies fever or chills.  Denies nausea or vomiting.  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  She has been seen in the ED several times for similar symptoms.  The history is provided by the patient.    Past Medical History  Diagnosis Date  . STD (female)     hx of chlamydia and gonorrhea  . History of PID   . Endometriosis   . Glaucoma   . Ovarian cyst    Past Surgical History  Procedure Laterality Date  . Fracture surgery      left leg  . Dilation and curettage of uterus    . Laparoscopy     Family History  Problem Relation Age of Onset  . Anesthesia problems Neg Hx   . Hypotension Neg Hx   . Malignant hyperthermia Neg Hx   . Pseudochol deficiency Neg Hx   . Alcohol abuse Neg Hx    History  Substance Use Topics  . Smoking status: Current Every Day Smoker -- 0.50 packs/day    Types: Cigarettes  . Smokeless tobacco: Not on file  . Alcohol Use: No     Comment:  Denies ETOH use   OB History   Grav Para Term Preterm Abortions TAB SAB Ect Mult Living   3 1 1  0 2 0 2 0 0 1     Review of Systems  Constitutional: Negative for fever and chills.  Gastrointestinal: Positive for abdominal pain. Negative for nausea, vomiting, diarrhea and constipation.  Genitourinary: Positive for dysuria, vaginal discharge, pelvic pain and dyspareunia. Negative for vaginal bleeding.  Musculoskeletal: Positive for back pain.  All other systems reviewed and are negative.    Allergies  Acyclovir and related; Darvocet; Doxycycline; Flexeril; Metoclopramide; Naproxen; Latex; and Tramadol  Home Medications   Current Outpatient Rx  Name  Route  Sig  Dispense  Refill  . ibuprofen (ADVIL,MOTRIN) 200 MG tablet   Oral   Take 600 mg by mouth every 6 (six) hours as needed for pain or fever.         . cephALEXin (KEFLEX) 500 MG capsule   Oral   Take 1 capsule (500 mg total) by mouth 2 (two) times daily.   20 capsule   0   . HYDROcodone-acetaminophen (HYCET) 7.5-325 mg/15 ml solution   Oral   Take 7.5 mLs by mouth every 8 (eight) hours as needed for pain.   45 mL   0   .  metroNIDAZOLE (FLAGYL) 500 MG tablet   Oral   Take 1 tablet (500 mg total) by mouth 2 (two) times daily. One tab PO bid x 10 days   20 tablet   0   . oxyCODONE-acetaminophen (PERCOCET/ROXICET) 5-325 MG per tablet      1 to 2 tabs PO q6hrs  PRN for pain   8 tablet   0    BP 133/85  Pulse 70  Temp(Src) 99.2 F (37.3 C) (Oral)  Resp 15  SpO2 100% Physical Exam  Nursing note and vitals reviewed. Constitutional: She appears well-developed and well-nourished.  Cardiovascular: Normal rate, regular rhythm and normal heart sounds.   Pulmonary/Chest: Effort normal and breath sounds normal.  Abdominal: Soft. Bowel sounds are normal. She exhibits no distension and no mass. There is no tenderness. There is no rebound and no guarding.  Genitourinary: Vagina normal. There is no rash or lesion on  the right labia. There is no rash or lesion on the left labia. Cervix exhibits no motion tenderness. Right adnexum displays no mass, no tenderness and no fullness. Left adnexum displays no mass, no tenderness and no fullness. No bleeding around the vagina.  Thick whitish colored discharge in the vaginal vault.  Neurological: She is alert.  Skin: Skin is warm and dry.  Psychiatric: She has a normal mood and affect.    ED Course   Procedures (including critical care time)  Labs Reviewed  WET PREP, GENITAL - Abnormal; Notable for the following:    Clue Cells Wet Prep HPF POC MANY (*)    WBC, Wet Prep HPF POC FEW (*)    All other components within normal limits  CBC WITH DIFFERENTIAL - Abnormal; Notable for the following:    Platelets 490 (*)    All other components within normal limits  GC/CHLAMYDIA PROBE AMP  URINALYSIS, ROUTINE W REFLEX MICROSCOPIC  BASIC METABOLIC PANEL  POCT PREGNANCY, URINE   No results found. No diagnosis found.  Discussed the option of giving the patient treatment for Gonorrhea and Chlamydia in the ED today due to her past history.  However, patient declined and stated that she wants to wait for the results to come back. MDM  Patient presenting with dysuria, increased urinary frequency, vaginal discharge, abdominal pain, and back pain.  Patient has nonsurgical abdominal exam. UA and Urine pregnancy negative.  Wet prep is consistent with bacterial vaginosis.  Patient treated with seven day course of Flagyl.  GC/Chlamydia pending.  I've advised patient that it is very important that she follow with OB/GYN if she has multiple GYN complaints, I have also asked her to follow with the Department of Health for full STD screening. We have discussed the importance of using condoms while having sexual intercourse.     Pascal Lux [place], PA-C 11/19/12 1101

## 2012-11-18 NOTE — ED Notes (Signed)
Pt c/o lower back pain, c/o urinary frequency, burning with urination, also c/o pain with intercourse

## 2012-11-18 NOTE — ED Notes (Signed)
Pt aware of need of urine sample. States she will inform us when she can urinate.

## 2012-11-18 NOTE — ED Notes (Signed)
Pt aware we need urine sample, sts unable to go now, will try soon.

## 2012-11-21 NOTE — ED Provider Notes (Signed)
Medical screening examination/treatment/procedure(s) were performed by non-physician practitioner and as supervising physician I was immediately available for consultation/collaboration.  Candyce Churn, MD 11/21/12 [phone]

## 2013-02-09 ENCOUNTER — Encounter (HOSPITAL_COMMUNITY): Payer: Self-pay | Admitting: Emergency Medicine

## 2013-02-09 ENCOUNTER — Emergency Department (HOSPITAL_COMMUNITY)
Admission: EM | Admit: 2013-02-09 | Discharge: 2013-02-09 | Disposition: A | Discharge status: Home / Self Care | Payer: Self-pay | Attending: Emergency Medicine | Admitting: Emergency Medicine

## 2013-02-09 DIAGNOSIS — M545 Low back pain, unspecified: Secondary | ICD-10-CM | POA: Insufficient documentation

## 2013-02-09 DIAGNOSIS — Z8619 Personal history of other infectious and parasitic diseases: Secondary | ICD-10-CM | POA: Insufficient documentation

## 2013-02-09 DIAGNOSIS — N760 Acute vaginitis: Secondary | ICD-10-CM | POA: Insufficient documentation

## 2013-02-09 DIAGNOSIS — N739 Female pelvic inflammatory disease, unspecified: Secondary | ICD-10-CM | POA: Insufficient documentation

## 2013-02-09 DIAGNOSIS — N730 Acute parametritis and pelvic cellulitis: Secondary | ICD-10-CM

## 2013-02-09 DIAGNOSIS — Z7982 Long term (current) use of aspirin: Secondary | ICD-10-CM | POA: Insufficient documentation

## 2013-02-09 DIAGNOSIS — IMO0002 Reserved for concepts with insufficient information to code with codable children: Secondary | ICD-10-CM | POA: Insufficient documentation

## 2013-02-09 DIAGNOSIS — Z3202 Encounter for pregnancy test, result negative: Secondary | ICD-10-CM | POA: Insufficient documentation

## 2013-02-09 DIAGNOSIS — M79609 Pain in unspecified limb: Secondary | ICD-10-CM | POA: Insufficient documentation

## 2013-02-09 DIAGNOSIS — N72 Inflammatory disease of cervix uteri: Secondary | ICD-10-CM | POA: Insufficient documentation

## 2013-02-09 DIAGNOSIS — F17200 Nicotine dependence, unspecified, uncomplicated: Secondary | ICD-10-CM | POA: Insufficient documentation

## 2013-02-09 DIAGNOSIS — H409 Unspecified glaucoma: Secondary | ICD-10-CM | POA: Insufficient documentation

## 2013-02-09 LAB — URINALYSIS, ROUTINE W REFLEX MICROSCOPIC
Bilirubin Urine: NEGATIVE
Ketones, ur: NEGATIVE mg/dL
Nitrite: NEGATIVE
Specific Gravity, Urine: 1.022 (ref 1.005–1.030)
Urobilinogen, UA: 1 mg/dL (ref 0.0–1.0)
pH: 6.5 (ref 5.0–8.0)

## 2013-02-09 LAB — URINE MICROSCOPIC-ADD ON

## 2013-02-09 LAB — PREGNANCY, URINE: Preg Test, Ur: NEGATIVE

## 2013-02-09 LAB — WET PREP, GENITAL

## 2013-02-09 MED ORDER — AZITHROMYCIN 250 MG PO TABS
1000.0000 mg | ORAL_TABLET | Freq: Once | ORAL | Status: AC
  Administered 2013-02-09: 1000 mg via ORAL
  Filled 2013-02-09: qty 4

## 2013-02-09 MED ORDER — LEVOFLOXACIN 500 MG PO TABS: 500.0000 mg | ORAL_TABLET | Freq: Every day | ORAL | Status: DC

## 2013-02-09 MED ORDER — STERILE WATER FOR INJECTION IJ SOLN
INTRAMUSCULAR | Status: AC
  Filled 2013-02-09: qty 10

## 2013-02-09 MED ORDER — HYDROCODONE-ACETAMINOPHEN 5-325 MG PO TABS: 1.0000 | ORAL_TABLET | Freq: Four times a day (QID) | ORAL | Status: DC | PRN

## 2013-02-09 MED ORDER — FLUCONAZOLE 150 MG PO TABS
150.0000 mg | ORAL_TABLET | Freq: Once | ORAL | Status: AC
  Administered 2013-02-09: 150 mg via ORAL
  Filled 2013-02-09: qty 1

## 2013-02-09 MED ORDER — HYDROCODONE-ACETAMINOPHEN 5-325 MG PO TABS
2.0000 | ORAL_TABLET | Freq: Once | ORAL | Status: AC
  Administered 2013-02-09: 2 via ORAL
  Filled 2013-02-09: qty 2

## 2013-02-09 MED ORDER — CEFTRIAXONE SODIUM 250 MG IJ SOLR
250.0000 mg | Freq: Once | INTRAMUSCULAR | Status: AC
  Administered 2013-02-09: 250 mg via INTRAMUSCULAR
  Filled 2013-02-09: qty 250

## 2013-02-09 MED ORDER — METRONIDAZOLE 500 MG PO TABS: 500.0000 mg | ORAL_TABLET | Freq: Two times a day (BID) | ORAL | Status: DC

## 2013-02-09 NOTE — ED Notes (Signed)
Pt c/o low back pain that radiates down her legs x 1 mo.  Pt states that she is also having vaginal pain with milky discharge x 1 wk.

## 2013-02-09 NOTE — ED Provider Notes (Addendum)
CSN: [id number]     Arrival date & time 02/09/13  1245 History   First MD Initiated Contact with Patient 02/09/13 1301     Chief Complaint  Patient presents with  . Back Pain  . Leg Pain  . Vaginal Pain  . Vaginal Discharge   (Consider location/radiation/quality/duration/timing/severity/associated sxs/prior Treatment) Patient is a 27 y.o. female presenting with back pain, leg pain, vaginal pain, and vaginal discharge. The history is provided by the patient.  Back Pain Associated symptoms: leg pain   Associated symptoms: no abdominal pain, no chest pain, no fever, no headaches, no numbness and no weakness   Leg Pain Associated symptoms: back pain   Associated symptoms: no fever and no neck pain   Vaginal Pain Pertinent negatives include no chest pain, no abdominal pain, no headaches and no shortness of breath.  Vaginal Discharge Associated symptoms: dyspareunia   Associated symptoms: no abdominal pain, no fever and no vomiting   pt states hx endometriosis, states in past few days, vaginal discharge, whitish, dyspareunia.  Also states low back pain for 1 month, non radicular, constant, dull. Denies fever or chills. No leg numbness/weakness. No perineal numbness. No dysuria or urgency. No flank pain. No problems w urinary incontinence or retention. Denies known std exposure. Notes similar symptoms and ed visits for same in past. lnmp 2 weeks ago.      Past Medical History  Diagnosis Date  . STD (female)     hx of chlamydia and gonorrhea  . History of PID   . Endometriosis   . Glaucoma   . Ovarian cyst    Past Surgical History  Procedure Laterality Date  . Fracture surgery      left leg  . Dilation and curettage of uterus    . Laparoscopy     Family History  Problem Relation Age of Onset  . Anesthesia problems Neg Hx   . Hypotension Neg Hx   . Malignant hyperthermia Neg Hx   . Pseudochol deficiency Neg Hx   . Alcohol abuse Neg Hx    History  Substance Use Topics  .  Smoking status: Current Every Day Smoker -- 0.50 packs/day    Types: Cigarettes  . Smokeless tobacco: Not on file  . Alcohol Use: No     Comment: Denies ETOH use   OB History   Grav Para Term Preterm Abortions TAB SAB Ect Mult Living   3 1 1  0 2 0 2 0 0 1     Review of Systems  Constitutional: Negative for fever and chills.  HENT: Negative for sore throat.   Eyes: Negative for redness.  Respiratory: Negative for shortness of breath.   Cardiovascular: Negative for chest pain.  Gastrointestinal: Negative for vomiting and abdominal pain.  Genitourinary: Positive for vaginal discharge, vaginal pain and dyspareunia. Negative for flank pain.  Musculoskeletal: Positive for back pain. Negative for neck pain.  Skin: Negative for rash.  Neurological: Negative for weakness, numbness and headaches.  Hematological: Does not bruise/bleed easily.  Psychiatric/Behavioral: Negative for confusion.    Allergies  Acyclovir and related; Darvocet; Doxycycline; Flexeril; Metoclopramide; Naproxen; Latex; and Tramadol  Home Medications   Current Outpatient Rx  Name  Route  Sig  Dispense  Refill  . aspirin 81 MG tablet   Oral   Take 81 mg by mouth daily.         Marland Kitchen ibuprofen (ADVIL,MOTRIN) 200 MG tablet   Oral   Take 600 mg by mouth every 6 (six) hours  as needed for pain or fever.          BP 109/70  Pulse 96  Temp(Src) 98.1 F (36.7 C) (Oral)  SpO2 98%  LMP 01/23/2013 Physical Exam  Nursing note and vitals reviewed. Constitutional: She appears well-developed and well-nourished. No distress.  Eyes: Conjunctivae are normal. No scleral icterus.  Neck: Neck supple. No tracheal deviation present.  Cardiovascular: Normal rate.   Pulmonary/Chest: Effort normal. No respiratory distress.  Abdominal: Soft. Normal appearance and bowel sounds are normal. She exhibits no distension. There is no tenderness. There is no rebound and no guarding.  Genitourinary:  No cva tenderness. Whitish/greyish  vaginal discharge. +cmt. No focal adx masses or focal/lateralizing tenderness.   Musculoskeletal: She exhibits no edema.  Neurological: She is alert.  Skin: Skin is warm and dry. No rash noted.  Psychiatric: She has a normal mood and affect.    ED Course  Procedures (including critical care time)  EKG Interpretation   None      Results for orders placed during the hospital encounter of 02/09/13  WET PREP, GENITAL      Result Value Range   Yeast Wet Prep HPF POC FEW (*) NONE SEEN   Trich, Wet Prep NONE SEEN  NONE SEEN   Clue Cells Wet Prep HPF POC MANY (*) NONE SEEN   WBC, Wet Prep HPF POC FEW (*) NONE SEEN  PREGNANCY, URINE      Result Value Range   Preg Test, Ur NEGATIVE  NEGATIVE  URINALYSIS, ROUTINE W REFLEX MICROSCOPIC      Result Value Range   Color, Urine YELLOW  YELLOW   APPearance CLEAR  CLEAR   Specific Gravity, Urine 1.022  1.005 - 1.030   pH 6.5  5.0 - 8.0   Glucose, UA NEGATIVE  NEGATIVE mg/dL   Hgb urine dipstick TRACE (*) NEGATIVE   Bilirubin Urine NEGATIVE  NEGATIVE   Ketones, ur NEGATIVE  NEGATIVE mg/dL   Protein, ur NEGATIVE  NEGATIVE mg/dL   Urobilinogen, UA 1.0  0.0 - 1.0 mg/dL   Nitrite NEGATIVE  NEGATIVE   Leukocytes, UA SMALL (*) NEGATIVE  URINE MICROSCOPIC-ADD ON      Result Value Range   Squamous Epithelial / LPF RARE  RARE   WBC, UA 3-6  <3 WBC/hpf   RBC / HPF 3-6  <3 RBC/hpf   Bacteria, UA RARE  RARE      MDM  Pt requests pain med prior to female exam. States has ride, does not have to drive.   vicodin po.  Labs.  Reviewed nursing notes and prior charts for additional history.   zithromax po. Rocephin im.  Yeast and clue cells on wet prep - will rx.   Recheck abd soft nt.  Given significant cmt, pain, ?mild pid, will rx. Allergy to doxy, therefore will give alternate rx.   Pt appear stable for d/c.       Suzi Roots, MD 02/09/13 [phone]

## 2013-02-11 LAB — GC/CHLAMYDIA PROBE AMP: GC Probe RNA: NEGATIVE

## 2013-03-28 ENCOUNTER — Encounter (HOSPITAL_COMMUNITY): Payer: Self-pay | Admitting: Emergency Medicine

## 2013-03-28 ENCOUNTER — Emergency Department (HOSPITAL_COMMUNITY)
Admission: EM | Admit: 2013-03-28 | Discharge: 2013-03-28 | Disposition: A | Discharge status: Home / Self Care | Payer: Medicaid Other | Attending: Emergency Medicine | Admitting: Emergency Medicine

## 2013-03-28 DIAGNOSIS — N72 Inflammatory disease of cervix uteri: Secondary | ICD-10-CM | POA: Insufficient documentation

## 2013-03-28 DIAGNOSIS — Z8619 Personal history of other infectious and parasitic diseases: Secondary | ICD-10-CM | POA: Insufficient documentation

## 2013-03-28 DIAGNOSIS — F17200 Nicotine dependence, unspecified, uncomplicated: Secondary | ICD-10-CM | POA: Insufficient documentation

## 2013-03-28 DIAGNOSIS — Z8669 Personal history of other diseases of the nervous system and sense organs: Secondary | ICD-10-CM | POA: Insufficient documentation

## 2013-03-28 DIAGNOSIS — R300 Dysuria: Secondary | ICD-10-CM | POA: Insufficient documentation

## 2013-03-28 DIAGNOSIS — R102 Pelvic and perineal pain: Secondary | ICD-10-CM

## 2013-03-28 DIAGNOSIS — R350 Frequency of micturition: Secondary | ICD-10-CM | POA: Insufficient documentation

## 2013-03-28 DIAGNOSIS — M25559 Pain in unspecified hip: Secondary | ICD-10-CM | POA: Insufficient documentation

## 2013-03-28 DIAGNOSIS — R3915 Urgency of urination: Secondary | ICD-10-CM | POA: Insufficient documentation

## 2013-03-28 DIAGNOSIS — Z7982 Long term (current) use of aspirin: Secondary | ICD-10-CM | POA: Insufficient documentation

## 2013-03-28 DIAGNOSIS — M545 Low back pain, unspecified: Secondary | ICD-10-CM | POA: Insufficient documentation

## 2013-03-28 DIAGNOSIS — Z91040 Latex allergy status: Secondary | ICD-10-CM | POA: Insufficient documentation

## 2013-03-28 DIAGNOSIS — Z3202 Encounter for pregnancy test, result negative: Secondary | ICD-10-CM | POA: Insufficient documentation

## 2013-03-28 DIAGNOSIS — Z79899 Other long term (current) drug therapy: Secondary | ICD-10-CM | POA: Insufficient documentation

## 2013-03-28 LAB — URINALYSIS W MICROSCOPIC + REFLEX CULTURE
Bilirubin Urine: NEGATIVE
Glucose, UA: NEGATIVE mg/dL
Ketones, ur: NEGATIVE mg/dL
Protein, ur: NEGATIVE mg/dL
Specific Gravity, Urine: 1.021 (ref 1.005–1.030)
Urobilinogen, UA: 1 mg/dL (ref 0.0–1.0)

## 2013-03-28 LAB — WET PREP, GENITAL
Trich, Wet Prep: NONE SEEN
Yeast Wet Prep HPF POC: NONE SEEN

## 2013-03-28 MED ORDER — HYDROCODONE-ACETAMINOPHEN 5-325 MG PO TABS
1.0000 | ORAL_TABLET | Freq: Once | ORAL | Status: AC
  Administered 2013-03-28: 1 via ORAL
  Filled 2013-03-28: qty 1

## 2013-03-28 MED ORDER — AZITHROMYCIN 250 MG PO TABS
1000.0000 mg | ORAL_TABLET | Freq: Once | ORAL | Status: AC
  Administered 2013-03-28: 1000 mg via ORAL
  Filled 2013-03-28: qty 4

## 2013-03-28 MED ORDER — CEFTRIAXONE SODIUM 250 MG IJ SOLR
250.0000 mg | Freq: Once | INTRAMUSCULAR | Status: AC
  Administered 2013-03-28: 250 mg via INTRAMUSCULAR
  Filled 2013-03-28: qty 250

## 2013-03-28 MED ORDER — LIDOCAINE HCL 1 % IJ SOLN
INTRAMUSCULAR | Status: AC
  Administered 2013-03-28: 0.9 mL
  Filled 2013-03-28: qty 20

## 2013-03-28 NOTE — ED Provider Notes (Signed)
CSN: [id number]     Arrival date & time 03/28/13  0222 History   First MD Initiated Contact with Patient 03/28/13 0224     Chief Complaint  Patient presents with  . Pelvic Pain   (Consider location/radiation/quality/duration/timing/severity/associated sxs/prior Treatment) HPI [name] is a 27 y.o. female who presents emergency department complaining of [name], pain, lower back pain, left thigh pain. Patient states her symptoms began 2 weeks ago but worsened last night. States pain is worse with intercourse and with walking and movement. Patient reports dysuria, urinary frequency, urinary urgency. She also reports abnormal vaginal discharge. Patient states she has a new sexual partner and does not use protection always. Patient states she has been using ibuprofen during the day and at nighttime with no relief in pain. Patient denies any fever, chills. She denies any nausea vomiting. She denies any changes in her bowels.  Past Medical History  Diagnosis Date  . STD (female)     hx of chlamydia and gonorrhea  . History of PID   . Endometriosis   . Glaucoma   . Ovarian cyst    Past Surgical History  Procedure Laterality Date  . Fracture surgery      left leg  . Dilation and curettage of uterus    . Laparoscopy     Family History  Problem Relation Age of Onset  . Anesthesia problems Neg Hx   . Hypotension Neg Hx   . Malignant hyperthermia Neg Hx   . Pseudochol deficiency Neg Hx   . Alcohol abuse Neg Hx    History  Substance Use Topics  . Smoking status: Current Every Day Smoker -- 0.50 packs/day    Types: Cigarettes  . Smokeless tobacco: Not on file  . Alcohol Use: No     Comment: Denies ETOH use   OB History   Grav Para Term Preterm Abortions TAB SAB Ect Mult Living   3 1 1  0 2 0 2 0 0 1     Review of Systems  Constitutional: Negative for fever and chills.  Respiratory: Negative for cough, chest tightness and shortness of breath.   Cardiovascular: Negative for  chest pain, palpitations and leg swelling.  Gastrointestinal: Positive for abdominal pain. Negative for nausea, vomiting and diarrhea.  Genitourinary: Positive for dysuria, urgency, frequency, vaginal discharge, vaginal pain and pelvic pain. Negative for flank pain and vaginal bleeding.  Musculoskeletal: Negative for arthralgias, myalgias, neck pain and neck stiffness.  Skin: Negative for rash.  Neurological: Negative for dizziness, weakness and headaches.  All other systems reviewed and are negative.    Allergies  Acyclovir and related; Darvocet; Doxycycline; Flexeril; Metoclopramide; Naproxen; Latex; and Tramadol  Home Medications   Current Outpatient Rx  Name  Route  Sig  Dispense  Refill  . aspirin 81 MG tablet   Oral   Take 81 mg by mouth daily.         Marland Kitchen HYDROcodone-acetaminophen (NORCO/VICODIN) 5-325 MG per tablet   Oral   Take 1-2 tablets by mouth every 6 (six) hours as needed for pain.   20 tablet   0   . ibuprofen (ADVIL,MOTRIN) 200 MG tablet   Oral   Take 600 mg by mouth every 6 (six) hours as needed for pain or fever.         Marland Kitchen levofloxacin (LEVAQUIN) 500 MG tablet   Oral   Take 1 tablet (500 mg total) by mouth daily.   14 tablet   0   . metroNIDAZOLE (  FLAGYL) 500 MG tablet   Oral   Take 1 tablet (500 mg total) by mouth 2 (two) times daily.   28 tablet   0    BP 123/85  Pulse 76  Temp(Src) 98.6 F (37 C) (Oral)  Resp 15  Ht 5\' 4"  (1.626 m)  Wt 143 lb (64.864 kg)  BMI 24.53 kg/m2  SpO2 96%  LMP 02/23/2013 Physical Exam  Nursing note and vitals reviewed. Constitutional: She appears well-developed and well-nourished. No distress.  HENT:  Head: Normocephalic.  Eyes: Conjunctivae are normal.  Neck: Neck supple.  Cardiovascular: Normal rate, regular rhythm and normal heart sounds.   Pulmonary/Chest: Effort normal and breath sounds normal. No respiratory distress. She has no wheezes. She has no rales.  Abdominal: Soft. Bowel sounds are normal.  She exhibits no distension. There is tenderness. There is no rebound.  Suprapubic tenderness  Genitourinary:  Normal external genitalia. Vaginal canal irritated. Purulent vaginal discharge. Cervix friable. Positive CMT, uterine tenderness, right adnexal tenderness.   Musculoskeletal: She exhibits no edema.  Neurological: She is alert.  Skin: Skin is warm and dry.  Psychiatric: She has a normal mood and affect. Her behavior is normal.    ED Course  Procedures (including critical care time) Labs Review Labs Reviewed  WET PREP, GENITAL - Abnormal; Notable for the following:    Clue Cells Wet Prep HPF POC RARE (*)    WBC, Wet Prep HPF POC MODERATE (*)    All other components within normal limits  URINALYSIS W MICROSCOPIC + REFLEX CULTURE - Abnormal; Notable for the following:    Hgb urine dipstick TRACE (*)    Leukocytes, UA SMALL (*)    All other components within normal limits  GC/CHLAMYDIA PROBE AMP  POCT PREGNANCY, URINE   Imaging Review No results found.  EKG Interpretation   None       MDM   1. Cervicitis   2. Pelvic pain      Pt with cervicitis based on exam. Moderate WBCs on wet prep. Treated with rocephin 250 mg IM and zithromax 1g PO. norco given for pain. Pt will be d/c home at this time. Her VS normal. No signs of PID. UA unremarkable. Pt admitted she is homeless and has no where to go and no way of getting anywhere. Will give a bus pass.   Filed Vitals:   03/28/13 0226 03/28/13 0322  BP: 123/85 116/72  Pulse: 76 60  Temp: 98.6 F (37 C) 98.1 F (36.7 C)  TempSrc: Oral Oral  Resp: 15 17  Height: 5\' 4"  (1.626 m)   Weight: 143 lb (64.864 kg)   SpO2: 96% 97%      Myriam Jacobson Johnathan Heskett, PA-C 03/28/13 0422

## 2013-03-28 NOTE — ED Notes (Signed)
Pt states that she has a hx of endometriosis and is having pain and pressure in her vaginal area. Clear vaginal discharge.

## 2013-03-28 NOTE — ED Notes (Signed)
Patient refuses to be discharged until she is given "another Vicodin to go." Will inform PA that patient is asking for more medication DC instructions reviewed with patient, during which patient pulled covers over her head Patient refusing to sign paperwork Bus pass given

## 2013-03-29 LAB — GC/CHLAMYDIA PROBE AMP
CT Probe RNA: NEGATIVE
GC Probe RNA: NEGATIVE

## 2013-03-29 NOTE — ED Provider Notes (Signed)
Medical screening examination/treatment/procedure(s) were performed by non-physician practitioner and as supervising physician I was immediately available for consultation/collaboration.  Anastasya Jewell T Lylah Lantis, MD 03/29/13 0523 

## 2013-04-25 NOTE — L&D Delivery Note (Signed)
Delivery Note At 9:44 AM a viable female was delivered via Vaginal, Spontaneous Delivery (Presentation: Left Occiput Anterior). 40 second shoulder dystocia resolved with McRoberts, suprapubic pressure, and internal maneuvers. NICU at delivery for thick meconium.  APGAR: 8, 9; weight 3260 gm.   Placenta status: Intact, Spontaneous.  Cord: 3 vessels with the following complications: None.   Anesthesia: Epidural  Episiotomy: None Lacerations: None Est. Blood Loss (mL): 250 cc  Mom to postpartum.  Baby to Couplet care / Skin to Skin.  William DaltonMcEachern, Jannice Beitzel 12/10/2013, 10:54 AM

## 2013-04-25 NOTE — L&D Delivery Note (Signed)
I was present for delivery, shoulder dystocia <871minute.  Maneuvers as above, successful delivery after corkscrew.  Dr. Debroah LoopArnold also present for delivery.  NICU at delivery.  Patient reporting severe pain, yelling, subsequently given 50mg  IV benadryl and 100mcg fentanyl (at least 30min apart).  Perry MountACOSTA,Maimouna Rondeau ROCIO, MD

## 2013-04-30 ENCOUNTER — Emergency Department (HOSPITAL_COMMUNITY)
Admission: EM | Admit: 2013-04-30 | Discharge: 2013-04-30 | Disposition: A | Discharge status: Home / Self Care | Payer: Medicaid Other | Attending: Emergency Medicine | Admitting: Emergency Medicine

## 2013-04-30 ENCOUNTER — Encounter (HOSPITAL_COMMUNITY): Payer: Self-pay | Admitting: Emergency Medicine

## 2013-04-30 DIAGNOSIS — N750 Cyst of Bartholin's gland: Secondary | ICD-10-CM | POA: Insufficient documentation

## 2013-04-30 DIAGNOSIS — Z8669 Personal history of other diseases of the nervous system and sense organs: Secondary | ICD-10-CM | POA: Insufficient documentation

## 2013-04-30 DIAGNOSIS — O99330 Smoking (tobacco) complicating pregnancy, unspecified trimester: Secondary | ICD-10-CM | POA: Insufficient documentation

## 2013-04-30 DIAGNOSIS — Z91040 Latex allergy status: Secondary | ICD-10-CM | POA: Insufficient documentation

## 2013-04-30 DIAGNOSIS — A599 Trichomoniasis, unspecified: Secondary | ICD-10-CM

## 2013-04-30 DIAGNOSIS — Z3490 Encounter for supervision of normal pregnancy, unspecified, unspecified trimester: Secondary | ICD-10-CM

## 2013-04-30 DIAGNOSIS — O98819 Other maternal infectious and parasitic diseases complicating pregnancy, unspecified trimester: Secondary | ICD-10-CM | POA: Insufficient documentation

## 2013-04-30 DIAGNOSIS — A5900 Urogenital trichomoniasis, unspecified: Secondary | ICD-10-CM | POA: Insufficient documentation

## 2013-04-30 DIAGNOSIS — O2390 Unspecified genitourinary tract infection in pregnancy, unspecified trimester: Secondary | ICD-10-CM | POA: Insufficient documentation

## 2013-04-30 LAB — URINE MICROSCOPIC-ADD ON

## 2013-04-30 LAB — URINALYSIS, ROUTINE W REFLEX MICROSCOPIC
Bilirubin Urine: NEGATIVE
Glucose, UA: NEGATIVE mg/dL
Hgb urine dipstick: NEGATIVE
Ketones, ur: NEGATIVE mg/dL
NITRITE: NEGATIVE
PH: 8 (ref 5.0–8.0)
Protein, ur: NEGATIVE mg/dL
SPECIFIC GRAVITY, URINE: 1.012 (ref 1.005–1.030)
Urobilinogen, UA: 0.2 mg/dL (ref 0.0–1.0)

## 2013-04-30 LAB — WET PREP, GENITAL: YEAST WET PREP: NONE SEEN

## 2013-04-30 LAB — GC/CHLAMYDIA PROBE AMP
CT Probe RNA: NEGATIVE
GC PROBE AMP APTIMA: NEGATIVE

## 2013-04-30 LAB — POCT PREGNANCY, URINE: Preg Test, Ur: POSITIVE — AB

## 2013-04-30 MED ORDER — HYDROCODONE-ACETAMINOPHEN 5-325 MG PO TABS
1.0000 | ORAL_TABLET | Freq: Once | ORAL | Status: AC
  Administered 2013-04-30: 1 via ORAL
  Filled 2013-04-30: qty 1

## 2013-04-30 MED ORDER — FOLIC ACID 1 MG PO TABS: 1.0000 mg | ORAL_TABLET | Freq: Every day | ORAL | Status: DC

## 2013-04-30 MED ORDER — CEFTRIAXONE SODIUM 250 MG IJ SOLR
250.0000 mg | Freq: Once | INTRAMUSCULAR | Status: AC
  Administered 2013-04-30: 250 mg via INTRAMUSCULAR
  Filled 2013-04-30: qty 250

## 2013-04-30 MED ORDER — HYDROMORPHONE HCL PF 1 MG/ML IJ SOLN
1.0000 mg | Freq: Once | INTRAMUSCULAR | Status: AC
  Administered 2013-04-30: 1 mg via INTRAMUSCULAR
  Filled 2013-04-30: qty 1

## 2013-04-30 MED ORDER — PROMETHAZINE HCL 25 MG PO TABS: 25.0000 mg | ORAL_TABLET | Freq: Four times a day (QID) | ORAL | Status: DC | PRN

## 2013-04-30 MED ORDER — METRONIDAZOLE 500 MG PO TABS: 500.0000 mg | ORAL_TABLET | Freq: Two times a day (BID) | ORAL | Status: DC

## 2013-04-30 MED ORDER — AZITHROMYCIN 250 MG PO TABS
1000.0000 mg | ORAL_TABLET | Freq: Once | ORAL | Status: AC
  Administered 2013-04-30: 1000 mg via ORAL
  Filled 2013-04-30: qty 4

## 2013-04-30 MED ORDER — MIDAZOLAM HCL 10 MG/2ML IJ SOLN
5.0000 mg | Freq: Once | INTRAMUSCULAR | Status: AC
  Administered 2013-04-30: 5 mg via INTRAMUSCULAR
  Filled 2013-04-30: qty 1

## 2013-04-30 NOTE — Progress Notes (Signed)
[facility] provided pt with a list of primary care resources, highlighting [facility]. Also, provided pt with [facility] information.  °

## 2013-04-30 NOTE — Discharge Instructions (Signed)
Please see the women's clinic in 2 days as requested. You have a trichomonas infection - so take the antibiotics provided, get your partner checked out - no unprotected intercourse until you are done with the antibiotics.   ABCs of Pregnancy A Antepartum care is very important. Be sure you see your doctor and get prenatal care as soon as you think you are pregnant. At this time, you will be tested for infection, genetic abnormalities and potential problems with you and the pregnancy. This is the time to discuss diet, exercise, work, medications, labor, pain medication during labor and the possibility of a cesarean delivery. Ask any questions that may concern you. It is important to see your doctor regularly throughout your pregnancy. Avoid exposure to toxic substances and chemicals - such as cleaning solvents, lead and mercury, some insecticides, and paint. Pregnant women should avoid exposure to paint fumes, and fumes that cause you to feel ill, dizzy or faint. When possible, it is a good idea to have a pre-pregnancy consultation with your caregiver to begin some important recommendations your caregiver suggests such as, taking folic acid, exercising, quitting smoking, avoiding alcoholic beverages, etc. B Breastfeeding is the healthiest choice for both you and your baby. It has many nutritional benefits for the baby and health benefits for the mother. It also creates a very tight and loving bond between the baby and mother. Talk to your doctor, your family and friends, and your employer about how you choose to feed your baby and how they can support you in your decision. Not all birth defects can be prevented, but a woman can take actions that may increase her chance of having a healthy baby. Many birth defects happen very early in pregnancy, sometimes before a woman even knows she is pregnant. Birth defects or abnormalities of any child in your or the father's family should be discussed with your  caregiver. Get a good support bra as your breast size changes. Wear it especially when you exercise and when nursing.  C Celebrate the news of your pregnancy with the your spouse/father and family. Childbirth classes are helpful to take for you and the spouse/father because it helps to understand what happens during the pregnancy, labor and delivery. Cesarean delivery should be discussed with your doctor so you are prepared for that possibility. The pros and cons of circumcision if it is a boy, should be discussed with your pediatrician. Cigarette smoking during pregnancy can result in low birth weight babies. It has been associated with infertility, miscarriages, tubal pregnancies, infant death (mortality) and poor health (morbidity) in childhood. Additionally, cigarette smoking may cause long-term learning disabilities. If you smoke, you should try to quit before getting pregnant and not smoke during the pregnancy. Secondary smoke may also harm a mother and her developing baby. It is a good idea to ask people to stop smoking around you during your pregnancy and after the baby is born. Extra calcium is necessary when you are pregnant and is found in your prenatal vitamin, in dairy products, green leafy vegetables and in calcium supplements. D A healthy diet according to your current weight and height, along with vitamins and mineral supplements should be discussed with your caregiver. Domestic abuse or violence should be made known to your doctor right away to get the situation corrected. Drink more water when you exercise to keep hydrated. Discomfort of your back and legs usually develops and progresses from the middle of the second trimester through to delivery of the baby.  This is because of the enlarging baby and uterus, which may also affect your balance. Do not take illegal drugs. Illegal drugs can seriously harm the baby and you. Drink extra fluids (water is best) throughout pregnancy to help your body  keep up with the increases in your blood volume. Drink at least 6 to 8 glasses of water, fruit juice, or milk each day. A good way to know you are drinking enough fluid is when your urine looks almost like clear water or is very light yellow.  E Eat healthy to get the nutrients you and your unborn baby need. Your meals should include the five basic food groups. Exercise (30 minutes of light to moderate exercise a day) is important and encouraged during pregnancy, if there are no medical problems or problems with the pregnancy. Exercise that causes discomfort or dizziness should be stopped and reported to your caregiver. Emotions during pregnancy can change from being ecstatic to depression and should be understood by you, your partner and your family. F Fetal screening with ultrasound, amniocentesis and monitoring during pregnancy and labor is common and sometimes necessary. Take 400 micrograms of folic acid daily both before, when possible, and during the first few months of pregnancy to reduce the risk of birth defects of the brain and spine. All women who could possibly become pregnant should take a vitamin with folic acid, every day. It is also important to eat a healthy diet with fortified foods (enriched grain products, including cereals, rice, breads, and pastas) and foods with natural sources of folate (orange juice, green leafy vegetables, beans, peanuts, broccoli, asparagus, peas, and lentils). The father should be involved with all aspects of the pregnancy including, the prenatal care, childbirth classes, labor, delivery, and postpartum time. Fathers may also have emotional concerns about being a father, financial needs, and raising a family. G Genetic testing should be done appropriately. It is important to know your family and the father's history. If there have been problems with pregnancies or birth defects in your family, report these to your doctor. Also, genetic counselors can talk with you  about the information you might need in making decisions about having a family. You can call a major medical center in your area for help in finding a board-certified genetic counselor. Genetic testing and counseling should be done before pregnancy when possible, especially if there is a history of problems in the mother's or father's family. Certain ethnic backgrounds are more at risk for genetic defects. H Get familiar with the hospital where you will be having your baby. Get to know how long it takes to get there, the labor and delivery area, and the hospital procedures. Be sure your medical insurance is accepted there. Get your home ready for the baby including, clothes, the baby's room (when possible), furniture and car seat. Hand washing is important throughout the day, especially after handling raw meat and poultry, changing the baby's diaper or using the bathroom. This can help prevent the spread of many bacteria and viruses that cause infection. Your hair may become dry and thinner, but will return to normal a few weeks after the baby is born. Heartburn is a common problem that can be treated by taking antacids recommended by your caregiver, eating smaller meals 5 or 6 times a day, not drinking liquids when eating, drinking between meals and raising the head of your bed 2 to 3 inches. I Insurance to cover you, the baby, doctor and hospital should be reviewed so that  you will be prepared to pay any costs not covered by your insurance plan. If you do not have medical insurance, there are usually clinics and services available for you in your community. Take 30 milligrams of iron during your pregnancy as prescribed by your doctor to reduce the risk of low red blood cells (anemia) later in pregnancy. All women of childbearing age should eat a diet rich in iron. J There should be a joint effort for the mother, father and any other children to adapt to the pregnancy financially, emotionally, and  psychologically during the pregnancy. Join a support group for moms-to-be. Or, join a class on parenting or childbirth. Have the family participate when possible. K Know your limits. Let your caregiver know if you experience any of the following:   Pain of any kind.  Strong cramps.  You develop a lot of weight in a short period of time (5 pounds in 3 to 5 days).  Vaginal bleeding, leaking of amniotic fluid.  Headache, vision problems.  Dizziness, fainting, shortness of breath.  Chest pain.  Fever of 102 F (38.9 C) or higher.  Gush of clear fluid from your vagina.  Painful urination.  Domestic violence.  Irregular heartbeat (palpitations).  Rapid beating of the heart (tachycardia).  Constant feeling sick to your stomach (nauseous) and vomiting.  Trouble walking, fluid retention (edema).  Muscle weakness.  If your baby has decreased activity.  Persistent diarrhea.  Abnormal vaginal discharge.  Uterine contractions at 20-minute intervals.  Back pain that travels down your leg. L Learn and practice that what you eat and drink should be in moderation and healthy for you and your baby. Legal drugs such as alcohol and caffeine are important issues for pregnant women. There is no safe amount of alcohol a woman can drink while pregnant. Fetal alcohol syndrome, a disorder characterized by growth retardation, facial abnormalities, and central nervous system dysfunction, is caused by a woman's use of alcohol during pregnancy. Caffeine, found in tea, coffee, soft drinks and chocolate, should also be limited. Be sure to read labels when trying to cut down on caffeine during pregnancy. More than 200 foods, beverages, and over-the-counter medications contain caffeine and have a high salt content! There are coffees and teas that do not contain caffeine. M Medical conditions such as diabetes, epilepsy, and high blood pressure should be treated and kept under control before pregnancy  when possible, but especially during pregnancy. Ask your caregiver about any medications that may need to be changed or adjusted during pregnancy. If you are currently taking any medications, ask your caregiver if it is safe to take them while you are pregnant or before getting pregnant when possible. Also, be sure to discuss any herbs or vitamins you are taking. They are medicines, too! Discuss with your doctor all medications, prescribed and over-the-counter, that you are taking. During your prenatal visit, discuss the medications your doctor may give you during labor and delivery. N Never be afraid to ask your doctor or caregiver questions about your health, the progress of the pregnancy, family problems, stressful situations, and recommendation for a pediatrician, if you do not have one. It is better to take all precautions and discuss any questions or concerns you may have during your office visits. It is a good idea to write down your questions before you visit the doctor. O Over-the-counter cough and cold remedies may contain alcohol or other ingredients that should be avoided during pregnancy. Ask your caregiver about prescription, herbs or over-the-counter  medications that you are taking or may consider taking while pregnant.  P Physical activity during pregnancy can benefit both you and your baby by lessening discomfort and fatigue, providing a sense of well-being, and increasing the likelihood of early recovery after delivery. Light to moderate exercise during pregnancy strengthens the belly (abdominal) and back muscles. This helps improve posture. Practicing yoga, walking, swimming, and cycling on a stationary bicycle are usually safe exercises for pregnant women. Avoid scuba diving, exercise at high altitudes (over 3000 feet), skiing, horseback riding, contact sports, etc. Always check with your doctor before beginning any kind of exercise, especially during pregnancy and especially if you did not  exercise before getting pregnant. Q Queasiness, stomach upset and morning sickness are common during pregnancy. Eating a couple of crackers or dry toast before getting out of bed. Foods that you normally love may make you feel sick to your stomach. You may need to substitute other nutritious foods. Eating 5 or 6 small meals a day instead of 3 large ones may make you feel better. Do not drink with your meals, drink between meals. Questions that you have should be written down and asked during your prenatal visits. R Read about and make plans to baby-proof your home. There are important tips for making your home a safer environment for your baby. Review the tips and make your home safer for you and your baby. Read food labels regarding calories, salt and fat content in the food. S Saunas, hot tubs, and steam rooms should be avoided while you are pregnant. Excessive high heat may be harmful during your pregnancy. Your caregiver will screen and examine you for sexually transmitted diseases and genetic disorders during your prenatal visits. Learn the signs of labor. Sexual relations while pregnant is safe unless there is a medical or pregnancy problem and your caregiver advises against it. T Traveling long distances should be avoided especially in the third trimester of your pregnancy. If you do have to travel out of state, be sure to take a copy of your medical records and medical insurance plan with you. You should not travel long distances without seeing your doctor first. Most airlines will not allow you to travel after 36 weeks of pregnancy. Toxoplasmosis is an infection caused by a parasite that can seriously harm an unborn baby. Avoid eating undercooked meat and handling cat litter. Be sure to wear gloves when gardening. Tingling of the hands and fingers is not unusual and is due to fluid retention. This will go away after the baby is born. U Womb (uterus) size increases during the first trimester. Your  kidneys will begin to function more efficiently. This may cause you to feel the need to urinate more often. You may also leak urine when sneezing, coughing or laughing. This is due to the growing uterus pressing against your bladder, which lies directly in front of and slightly under the uterus during the first few months of pregnancy. If you experience burning along with frequency of urination or bloody urine, be sure to tell your doctor. The size of your uterus in the third trimester may cause a problem with your balance. It is advisable to maintain good posture and avoid wearing high heels during this time. An ultrasound of your baby may be necessary during your pregnancy and is safe for you and your baby. V Vaccinations are an important concern for pregnant women. Get needed vaccines before pregnancy. Center for Disease Control ([employer]) has clear guidelines for the use of  vaccines during pregnancy. Review the list, be sure to discuss it with your doctor. Prenatal vitamins are helpful and healthy for you and the baby. Do not take extra vitamins except what is recommended. Taking too much of certain vitamins can cause overdose problems. Continuous vomiting should be reported to your caregiver. Varicose veins may appear especially if there is a family history of varicose veins. They should subside after the delivery of the baby. Support hose helps if there is leg discomfort. W Being overweight or underweight during pregnancy may cause problems. Try to get within 15 pounds of your ideal weight before pregnancy. Remember, pregnancy is not a time to be dieting! Do not stop eating or start skipping meals as your weight increases. Both you and your baby need the calories and nutrition you receive from a healthy diet. Be sure to consult with your doctor about your diet. There is a formula and diet plan available depending on whether you are overweight or underweight. Your caregiver or nutritionist can help and  advise you if necessary. X Avoid X-rays. If you must have dental work or diagnostic tests, tell your dentist or physician that you are pregnant so that extra care can be taken. X-rays should only be taken when the risks of not taking them outweigh the risk of taking them. If needed, only the minimum amount of radiation should be used. When X-rays are necessary, protective lead shields should be used to cover areas of the body that are not being X-rayed. Y Your baby loves you. Breastfeeding your baby creates a loving and very close bond between the two of you. Give your baby a healthy environment to live in while you are pregnant. Infants and children require constant care and guidance. Their health and safety should be carefully watched at all times. After the baby is born, rest or take a nap when the baby is sleeping. Z Get your ZZZs. Be sure to get plenty of rest. Resting on your side as often as possible, especially on your left side is advised. It provides the best circulation to your baby and helps reduce swelling. Try taking a nap for 30 to 45 minutes in the afternoon when possible. After the baby is born rest or take a nap when the baby is sleeping. Try elevating your feet for that amount of time when possible. It helps the circulation in your legs and helps reduce swelling.  Most information courtesy of the CDC. Document Released: 04/11/2005 Document Revised: 07/04/2011 Document Reviewed: 12/24/2008 [facility] Patient Information 2014 [place], [place].  Bartholin's Cyst or Abscess Bartholin's glands are small glands located within the folds of skin (labia) along the sides of the lower opening of the vagina (birth canal). A cyst may develop when the duct of the gland becomes blocked. When this happens, fluid that accumulates within the cyst can become infected. This is known as an abscess. The Bartholin gland produces a mucous fluid to lubricate the outside of the vagina during sexual  intercourse. SYMPTOMS   Patients with a small cyst may not have any symptoms.  Mild discomfort to severe pain depending on the size of the cyst and if it is infected (abscess).  Pain, redness, and swelling around the lower opening of the vagina.  Painful intercourse.  Pressure in the perineal area.  Swelling of the lips of the vagina (labia).  The cyst or abscess can be on one side or both sides of the vagina. DIAGNOSIS   A large swelling is seen in  the lower vagina area by your caregiver.  Painful to touch.  Redness and pain, if it is an abscess. TREATMENT   Sometimes the cyst will go away on its own.  Apply warm wet compresses to the area or take hot sitz baths several times a day.  An incision to drain the cyst or abscess with local anesthesia.  Culture the pus, if it is an abscess.  Antibiotic treatment, if it is an abscess.  Cut open the gland and suture the edges to make the opening of the gland bigger (marsupialization).  Remove the whole gland if the cyst or abscess returns. PREVENTION   Practice good hygiene.  Clean the vaginal area with a mild soap and soft cloth when bathing.  Do not rub hard in the vaginal area when bathing.  Protect the crotch area with a padded cushion if you take long bike rides or ride horses.  Be sure you are well lubricated when you have sexual intercourse. HOME CARE INSTRUCTIONS   If your cyst or abscess was opened, a small piece of gauze, or a drain, may have been placed in the wound to allow drainage. Do not remove this gauze or drain unless directed by your caregiver.  Wear feminine pads, not tampons, as needed for any drainage or bleeding.  If antibiotics were prescribed, take them exactly as directed. Finish the entire course.  Only take over-the-counter or prescription medicines for pain, discomfort, or fever as directed by your caregiver. SEEK IMMEDIATE MEDICAL CARE IF:   You have an increase in pain, redness,  swelling, or drainage.  You have bleeding from the wound which results in the use of more than the number of pads suggested by your caregiver in 24 hours.  You have chills.  You have a fever.  You develop any new problems (symptoms) or aggravation of your existing condition. MAKE SURE YOU:   Understand these instructions.  Will watch your condition.  Will get help right away if you are not doing well or get worse. Document Released: 04/11/2005 Document Revised: 07/04/2011 Document Reviewed: 11/28/2007 [facility] Patient Information 2014 [place], [place].  Trichomoniasis Trichomoniasis is an infection, caused by the Trichomonas organism, that affects both women and men. In women, the outer female genitalia and the vagina are affected. In men, the penis is mainly affected, but the prostate and other reproductive organs can also be involved. Trichomoniasis is a sexually transmitted disease (STD) and is most often passed to another person through sexual contact. The majority of people who get trichomoniasis do so from a sexual encounter and are also at risk for other STDs. CAUSES   Sexual intercourse with an infected partner.  It can be present in swimming pools or hot tubs. SYMPTOMS   Abnormal gray-green frothy vaginal discharge in women.  Vaginal itching and irritation in women.  Itching and irritation of the area outside the vagina in women.  Penile discharge with or without pain in males.  Inflammation of the urethra (urethritis), causing painful urination.  Bleeding after sexual intercourse. RELATED COMPLICATIONS  Pelvic inflammatory disease.  Infection of the uterus (endometritis).  Infertility.  Tubal (ectopic) pregnancy.  It can be associated with other STDs, including gonorrhea and chlamydia, hepatitis B, and HIV. COMPLICATIONS DURING PREGNANCY  Early (premature) delivery.  Premature rupture of the membranes (PROM).  Low birth weight. DIAGNOSIS    Visualization of Trichomonas under the microscope from the vagina discharge.  Ph of the vagina greater than 4.5, tested with a test tape.  Trich Rapid Test.  Culture of the organism, but this is not usually needed.  It may be found on a Pap test.  Having a "strawberry cervix,"which means the cervix looks very red like a strawberry. TREATMENT   You may be given medication to fight the infection. Inform your caregiver if you could be or are pregnant. Some medications used to treat the infection should not be taken during pregnancy.  Over-the-counter medications or creams to decrease itching or irritation may be recommended.  Your sexual partner will need to be treated if infected. HOME CARE INSTRUCTIONS   Take all medication prescribed by your caregiver.  Take over-the-counter medication for itching or irritation as directed by your caregiver.  Do not have sexual intercourse while you have the infection.  Do not douche or wear tampons.  Discuss your infection with your partner, as your partner may have acquired the infection from you. Or, your partner may have been the person who transmitted the infection to you.  Have your sex partner examined and treated if necessary.  Practice safe, informed, and protected sex.  See your caregiver for other STD testing. SEEK MEDICAL CARE IF:   You still have symptoms after you finish the medication.  You have an oral temperature above 102 F (38.9 C).  You develop belly (abdominal) pain.  You have pain when you urinate.  You have bleeding after sexual intercourse.  You develop a rash.  The medication makes you sick or makes you throw up (vomit). Document Released: 10/05/2000 Document Revised: 07/04/2011 Document Reviewed: 10/31/2008 [facility] Patient Information 2014 [place].

## 2013-04-30 NOTE — ED Provider Notes (Addendum)
CSN: [id number]     Arrival date & time 04/30/13  0741 History   First MD Initiated Contact with Patient 04/30/13 [phone]     Chief Complaint  Patient presents with  . Cyst    vaginal   (Consider location/radiation/quality/duration/timing/severity/associated sxs/prior Treatment) HPI Comments: Pt comes in with cc of cyst. Pt has hx of bartholin cyst, and states that she noted a nodule in her vaginal area - similar to the previous cyst, and started having pain again. Her pain is severe, and is worse when she ambulates. She also thinks she is pregnant. Pt has faint, white vaginal discharge. No vaginal bleeding. Pt has no abd pain. No n/v/f/c.  The history is provided by the patient.    Past Medical History  Diagnosis Date  . STD (female)     hx of chlamydia and gonorrhea  . History of PID   . Endometriosis   . Glaucoma   . Ovarian cyst    Past Surgical History  Procedure Laterality Date  . Fracture surgery      left leg  . Dilation and curettage of uterus    . Laparoscopy     Family History  Problem Relation Age of Onset  . Anesthesia problems Neg Hx   . Hypotension Neg Hx   . Malignant hyperthermia Neg Hx   . Pseudochol deficiency Neg Hx   . Alcohol abuse Neg Hx    History  Substance Use Topics  . Smoking status: Current Every Day Smoker -- 0.50 packs/day    Types: Cigarettes  . Smokeless tobacco: Not on file  . Alcohol Use: No     Comment: Denies ETOH use   OB History   Grav Para Term Preterm Abortions TAB SAB Ect Mult Living   3 1 1  0 2 0 2 0 0 1     Review of Systems  Constitutional: Positive for activity change.  Respiratory: Negative for shortness of breath.   Cardiovascular: Negative for chest pain.  Gastrointestinal: Negative for nausea, vomiting and abdominal pain.  Genitourinary: Positive for vaginal discharge and vaginal pain. Negative for dysuria.  Musculoskeletal: Negative for neck pain.  Skin: Positive for rash.  Allergic/Immunologic: Negative for  immunocompromised state.  Neurological: Negative for headaches.  Hematological: Does not bruise/bleed easily.    Allergies  Acyclovir and related; Darvocet; Doxycycline; Flexeril; Metoclopramide; Naproxen; Latex; and Tramadol  Home Medications   Current Outpatient Rx  Name  Route  Sig  Dispense  Refill  . ibuprofen (ADVIL,MOTRIN) 200 MG tablet   Oral   Take 400 mg by mouth every 6 (six) hours as needed for moderate pain.          BP 110/67  Pulse 69  Temp(Src) 98.7 F (37.1 C) (Oral)  Resp 20  SpO2 100%  LMP 02/28/2013 Physical Exam  Nursing note and vitals reviewed. Constitutional: She is oriented to person, place, and time. She appears well-developed and well-nourished.  HENT:  Head: Normocephalic and atraumatic.  Eyes: Conjunctivae and EOM are normal. Pupils are equal, round, and reactive to light.  Neck: Normal range of motion. Neck supple.  Cardiovascular: Normal rate, regular rhythm, normal heart sounds and intact distal pulses.   No murmur heard. Pulmonary/Chest: Effort normal. No respiratory distress. She has no wheezes.  Abdominal: Soft. Bowel sounds are normal. She exhibits no distension. There is no tenderness. There is no rebound and no guarding.  Genitourinary: Vagina normal and uterus normal.  External exam - left vulvovaginal region, there is some  edema and fluctuance with tenderness. Speculum exam: Pt has some yellow discharge, no blood Bimanual exam: Patient has + CMT, no adnexal tenderness or fullness and cervical os is closed  Neurological: She is alert and oriented to person, place, and time.  Skin: Skin is warm and dry.    ED Course  Word catheter placed Date/Time: 04/30/2013 10:50 AM Performed by: Derwood KaplanNANAVATI, Angeliyah Kirkey Authorized by: Derwood KaplanNANAVATI, Lillionna Nabi Consent: Verbal consent obtained. Risks and benefits: risks, benefits and alternatives were discussed Consent given by: patient Patient understanding: patient states understanding of the procedure being  performed Required items: required blood products, implants, devices, and special equipment available Patient identity confirmed: verbally with patient Time out: Immediately prior to procedure a "time out" was called to verify the correct patient, procedure, equipment, support staff and site/side marked as required. Preparation: Patient was prepped and draped in the usual sterile fashion. Local anesthesia used: yes Anesthesia: local infiltration Local anesthetic: lidocaine 2% with epinephrine Anesthetic total: 4 ml Patient sedated: yes Sedation type: moderate (conscious) sedation Sedatives: midazolam Analgesia: hydromorphone Vitals: Vital signs were monitored during sedation. Patient tolerance: Patient tolerated the procedure well with no immediate complications.  INCISION AND DRAINAGE Date/Time: 04/30/2013 10:51 AM Performed by: Derwood KaplanNANAVATI, Keirsten Matuska Authorized by: Derwood KaplanNANAVATI, Culley Hedeen Consent: Verbal consent obtained. Risks and benefits: risks, benefits and alternatives were discussed Consent given by: patient Patient identity confirmed: verbally with patient Type: cyst Body area: anogenital Location details: Bartholin's gland Anesthesia: local infiltration Local anesthetic: lidocaine 2% with epinephrine Anesthetic total: 4 ml Patient sedated: yes Sedation type: anxiolysis Sedatives: midazolam Analgesia: hydromorphone Risk factor: underlying major vessel Scalpel size: 11 Incision type: single straight Complexity: simple Drainage: purulent Drainage amount: copious Wound treatment: drain placed Packing material: WORD CATHETER. Patient tolerance: Patient tolerated the procedure well with no immediate complications.   (including critical care time) Labs Review Labs Reviewed  GC/CHLAMYDIA PROBE AMP  WET PREP, GENITAL  URINALYSIS, ROUTINE W REFLEX MICROSCOPIC   Imaging Review No results found.  EKG Interpretation   None       MDM  No diagnosis found.  Pt comes in with cc  of cyst. Pt has bartholin's cyst, which we drained - and placed a WORD catheter.  Pt also is pregnant and has Cervical motion tenderness on exam, with yellow discharge.  Will treat with ceftriaxone and AZT.  [facility], and she agrees with the plan.  Pt to see [facility] in 2 days for catheter removal.    Derwood KaplanAnkit Jaquayla Hege, MD 04/30/13 1109    Derwood KaplanAnkit Jillaine Waren, MD 05/02/13 1719

## 2013-04-30 NOTE — ED Notes (Addendum)
Pt c/o vaginal cyst, started feeling pain yesterday, Denies bleeding but states it hurts to use bathroom. Last cyst in 2008. Pt also states she may be pregnant

## 2013-05-03 ENCOUNTER — Encounter (HOSPITAL_COMMUNITY): Payer: Self-pay | Admitting: *Deleted

## 2013-05-03 ENCOUNTER — Inpatient Hospital Stay (HOSPITAL_COMMUNITY)
Admission: AD | Admit: 2013-05-03 | Discharge: 2013-05-03 | Disposition: A | Discharge status: Home / Self Care | Payer: Medicaid Other | Source: Ambulatory Visit | Attending: Obstetrics & Gynecology | Admitting: Obstetrics & Gynecology

## 2013-05-03 ENCOUNTER — Inpatient Hospital Stay (HOSPITAL_COMMUNITY): Payer: Medicaid Other

## 2013-05-03 DIAGNOSIS — O23591 Infection of other part of genital tract in pregnancy, first trimester: Secondary | ICD-10-CM

## 2013-05-03 DIAGNOSIS — Z3490 Encounter for supervision of normal pregnancy, unspecified, unspecified trimester: Secondary | ICD-10-CM

## 2013-05-03 DIAGNOSIS — B9689 Other specified bacterial agents as the cause of diseases classified elsewhere: Secondary | ICD-10-CM | POA: Insufficient documentation

## 2013-05-03 DIAGNOSIS — A5901 Trichomonal vulvovaginitis: Secondary | ICD-10-CM | POA: Insufficient documentation

## 2013-05-03 DIAGNOSIS — N750 Cyst of Bartholin's gland: Secondary | ICD-10-CM

## 2013-05-03 DIAGNOSIS — N760 Acute vaginitis: Secondary | ICD-10-CM | POA: Insufficient documentation

## 2013-05-03 DIAGNOSIS — A499 Bacterial infection, unspecified: Secondary | ICD-10-CM | POA: Insufficient documentation

## 2013-05-03 DIAGNOSIS — F17200 Nicotine dependence, unspecified, uncomplicated: Secondary | ICD-10-CM | POA: Insufficient documentation

## 2013-05-03 DIAGNOSIS — N949 Unspecified condition associated with female genital organs and menstrual cycle: Secondary | ICD-10-CM | POA: Insufficient documentation

## 2013-05-03 HISTORY — DX: Cyst of Bartholin's gland: N75.0

## 2013-05-03 LAB — CBC
HCT: 32.8 % — ABNORMAL LOW (ref 36.0–46.0)
Hemoglobin: 11.7 g/dL — ABNORMAL LOW (ref 12.0–15.0)
MCH: 30.5 pg (ref 26.0–34.0)
MCHC: 35.7 g/dL (ref 30.0–36.0)
MCV: 85.4 fL (ref 78.0–100.0)
PLATELETS: 364 10*3/uL (ref 150–400)
RBC: 3.84 MIL/uL — ABNORMAL LOW (ref 3.87–5.11)
RDW: 14.1 % (ref 11.5–15.5)
WBC: 16.4 10*3/uL — AB (ref 4.0–10.5)

## 2013-05-03 LAB — DIFFERENTIAL
BASOS ABS: 0 10*3/uL (ref 0.0–0.1)
Basophils Relative: 0 % (ref 0–1)
EOS PCT: 0 % (ref 0–5)
Eosinophils Absolute: 0.1 10*3/uL (ref 0.0–0.7)
LYMPHS PCT: 13 % (ref 12–46)
Lymphs Abs: 2.2 10*3/uL (ref 0.7–4.0)
Monocytes Absolute: 1.8 10*3/uL — ABNORMAL HIGH (ref 0.1–1.0)
Monocytes Relative: 11 % (ref 3–12)
NEUTROS PCT: 76 % (ref 43–77)
Neutro Abs: 12.4 10*3/uL — ABNORMAL HIGH (ref 1.7–7.7)

## 2013-05-03 LAB — HCG, QUANTITATIVE, PREGNANCY: hCG, Beta Chain, Quant, S: 48866 m[IU]/mL — ABNORMAL HIGH (ref <5)

## 2013-05-03 MED ORDER — CEPHALEXIN 500 MG PO CAPS: 500.0000 mg | ORAL_CAPSULE | Freq: Four times a day (QID) | ORAL | Status: DC

## 2013-05-03 MED ORDER — ACETAMINOPHEN-CODEINE #3 300-30 MG PO TABS: 1.0000 | ORAL_TABLET | ORAL | Status: DC | PRN

## 2013-05-03 MED ORDER — METRONIDAZOLE 500 MG PO TABS
500.0000 mg | ORAL_TABLET | Freq: Once | ORAL | Status: AC
  Administered 2013-05-03: 500 mg via ORAL
  Filled 2013-05-03: qty 1

## 2013-05-03 MED ORDER — HYDROMORPHONE HCL PF 1 MG/ML IJ SOLN
1.0000 mg | Freq: Once | INTRAMUSCULAR | Status: AC
  Administered 2013-05-03: 1 mg via INTRAMUSCULAR

## 2013-05-03 MED ORDER — METRONIDAZOLE 500 MG PO TABS: 500.0000 mg | ORAL_TABLET | Freq: Two times a day (BID) | ORAL | Status: DC

## 2013-05-03 MED ORDER — HYDROMORPHONE HCL PF 1 MG/ML IJ SOLN
INTRAMUSCULAR | Status: AC
  Administered 2013-05-03: 1 mg via INTRAMUSCULAR
  Filled 2013-05-03: qty 1

## 2013-05-03 NOTE — Discharge Instructions (Signed)
Trichomoniasis Trichomoniasis is an infection, caused by the Trichomonas organism, that affects both women and men. In women, the outer female genitalia and the vagina are affected. In men, the penis is mainly affected, but the prostate and other reproductive organs can also be involved. Trichomoniasis is a sexually transmitted disease (STD) and is most often passed to another person through sexual contact. The majority of people who get trichomoniasis do so from a sexual encounter and are also at risk for other STDs. CAUSES   Sexual intercourse with an infected partner.  It can be present in swimming pools or hot tubs. SYMPTOMS   Abnormal gray-green frothy vaginal discharge in women.  Vaginal itching and irritation in women.  Itching and irritation of the area outside the vagina in women.  Penile discharge with or without pain in males.  Inflammation of the urethra (urethritis), causing painful urination.  Bleeding after sexual intercourse. RELATED COMPLICATIONS  Pelvic inflammatory disease.  Infection of the uterus (endometritis).  Infertility.  Tubal (ectopic) pregnancy.  It can be associated with other STDs, including gonorrhea and chlamydia, hepatitis B, and HIV. COMPLICATIONS DURING PREGNANCY  Early (premature) delivery.  Premature rupture of the membranes (PROM).  Low birth weight. DIAGNOSIS   Visualization of Trichomonas under the microscope from the vagina discharge.  Ph of the vagina greater than 4.5, tested with a test tape.  Trich Rapid Test.  Culture of the organism, but this is not usually needed.  It may be found on a Pap test.  Having a "strawberry cervix,"which means the cervix looks very red like a strawberry. TREATMENT   You may be given medication to fight the infection. Inform your caregiver if you could be or are pregnant. Some medications used to treat the infection should not be taken during pregnancy.  Over-the-counter medications or  creams to decrease itching or irritation may be recommended.  Your sexual partner will need to be treated if infected. HOME CARE INSTRUCTIONS   Take all medication prescribed by your caregiver.  Take over-the-counter medication for itching or irritation as directed by your caregiver.  Do not have sexual intercourse while you have the infection.  Do not douche or wear tampons.  Discuss your infection with your partner, as your partner may have acquired the infection from you. Or, your partner may have been the person who transmitted the infection to you.  Have your sex partner examined and treated if necessary.  Practice safe, informed, and protected sex.  See your caregiver for other STD testing. SEEK MEDICAL CARE IF:   You still have symptoms after you finish the medication.  You have an oral temperature above 102 F (38.9 C).  You develop belly (abdominal) pain.  You have pain when you urinate.  You have bleeding after sexual intercourse.  You develop a rash.  The medication makes you sick or makes you throw up (vomit). Document Released: 10/05/2000 Document Revised: 07/04/2011 Document Reviewed: 10/31/2008 [facility] Patient Information 2014 [place], [place]. Pregnancy - First Trimester During sexual intercourse, millions of sperm go into the vagina. Only 1 sperm will penetrate and fertilize the female egg while it is in the Fallopian tube. One week later, the fertilized egg implants into the wall of the uterus. An embryo begins to develop into a baby. At 6 to 8 weeks, the eyes and face are formed and the heartbeat can be seen on ultrasound. At the end of 12 weeks (first trimester), all the baby's organs are formed. Now that you are pregnant, you  will want to do everything you can to have a healthy baby. Two of the most important things are to get good prenatal care and follow your caregiver's instructions. Prenatal care is all the medical care you receive before the baby's  birth. It is given to prevent, find, and treat problems during the pregnancy and childbirth. PRENATAL EXAMS  During prenatal visits, your weight, blood pressure, and urine are checked. This is done to make sure you are healthy and progressing normally during the pregnancy.  A pregnant woman should gain 25 to 35 pounds during the pregnancy. However, if you are overweight or underweight, your caregiver will advise you regarding your weight.  Your caregiver will ask and answer questions for you.  Blood work, cervical cultures, other necessary tests, and a Pap test are done during your prenatal exams. These tests are done to check on your health and the probable health of your baby. Tests are strongly recommended and done for HIV with your permission. This is the virus that causes AIDS. These tests are done because medicines can be given to help prevent your baby from being born with this infection should you have been infected without knowing it. Blood work is also used to find out your blood type, previous infections, and follow your blood levels (hemoglobin).  Low hemoglobin (anemia) is common during pregnancy. Iron and vitamins are given to help prevent this. Later in the pregnancy, blood tests for diabetes will be done along with any other tests if any problems develop.  You may need other tests to make sure you and the baby are doing well. CHANGES DURING THE FIRST TRIMESTER  Your body goes through many changes during pregnancy. They vary from person to person. Talk to your caregiver about changes you notice and are concerned about. Changes can include:  Your menstrual period stops.  The egg and sperm carry the genes that determine what you look like. Genes from you and your partner are forming a baby. The female genes determine whether the baby is a boy or a girl.  Your body increases in girth and you may feel bloated.  Feeling sick to your stomach (nauseous) and throwing up (vomiting). If the  vomiting is uncontrollable, call your caregiver.  Your breasts will begin to enlarge and become tender.  Your nipples may stick out more and become darker.  The need to urinate more. Painful urination may mean you have a bladder infection.  Tiring easily.  Loss of appetite.  Cravings for certain kinds of food.  At first, you may gain or lose a couple of pounds.  You may have changes in your emotions from day to day (excited to be pregnant or concerned something may go wrong with the pregnancy and baby).  You may have more vivid and strange dreams. HOME CARE INSTRUCTIONS   It is very important to avoid all smoking, alcohol and non-prescribed drugs during your pregnancy. These affect the formation and growth of the baby. Avoid chemicals while pregnant to ensure the delivery of a healthy infant.  Start your prenatal visits by the 12th week of pregnancy. They are usually scheduled monthly at first, then more often in the last 2 months before delivery. Keep your caregiver's appointments. Follow your caregiver's instructions regarding medicine use, blood and lab tests, exercise, and diet.  During pregnancy, you are providing food for you and your baby. Eat regular, well-balanced meals. Choose foods such as meat, fish, milk and other low fat dairy products, vegetables, fruits, and  whole-grain breads and cereals. Your caregiver will tell you of the ideal weight gain.  You can help morning sickness by keeping soda crackers at the bedside. Eat a couple before arising in the morning. You may want to use the crackers without salt on them.  Eating 4 to 5 small meals rather than 3 large meals a day also may help the nausea and vomiting.  Drinking liquids between meals instead of during meals also seems to help nausea and vomiting.  A physical sexual relationship may be continued throughout pregnancy if there are no other problems. Problems may be early (premature) leaking of amniotic fluid from  the membranes, vaginal bleeding, or belly (abdominal) pain.  Exercise regularly if there are no restrictions. Check with your caregiver or physical therapist if you are unsure of the safety of some of your exercises. Greater weight gain will occur in the last 2 trimesters of pregnancy. Exercising will help:  Control your weight.  Keep you in shape.  Prepare you for labor and delivery.  Help you lose your pregnancy weight after you deliver your baby.  Wear a good support or jogging bra for breast tenderness during pregnancy. This may help if worn during sleep too.  Ask when prenatal classes are available. Begin classes when they are offered.  Do not use hot tubs, steam rooms, or saunas.  Wear your seat belt when driving. This protects you and your baby if you are in an accident.  Avoid raw meat, uncooked cheese, cat litter boxes, and soil used by cats throughout the pregnancy. These carry germs that can cause birth defects in the baby.  The first trimester is a good time to visit your dentist for your dental health. Getting your teeth cleaned is okay. Use a softer toothbrush and brush gently during pregnancy.  Ask for help if you have financial, counseling, or nutritional needs during pregnancy. Your caregiver will be able to offer counseling for these needs as well as refer you for other special needs.  Do not take any medicines or herbs unless told by your caregiver.  Inform your caregiver if there is any mental or physical domestic violence.  Make a list of emergency phone numbers of family, friends, hospital, and police and fire departments.  Write down your questions. Take them to your prenatal visit.  Do not douche.  Do not cross your legs.  If you have to stand for long periods of time, rotate you feet or take small steps in a circle.  You may have more vaginal secretions that may require a sanitary pad. Do not use tampons or scented sanitary pads. MEDICINES AND DRUG  USE IN PREGNANCY  Take prenatal vitamins as directed. The vitamin should contain 1 milligram of folic acid. Keep all vitamins out of reach of children. Only a couple vitamins or tablets containing iron may be fatal to a baby or young child when ingested.  Avoid use of all medicines, including herbs, over-the-counter medicines, not prescribed or suggested by your caregiver. Only take over-the-counter or prescription medicines for pain, discomfort, or fever as directed by your caregiver. Do not use aspirin, ibuprofen, or naproxen unless directed by your caregiver.  Let your caregiver also know about herbs you may be using.  Alcohol is related to a number of birth defects. This includes fetal alcohol syndrome. All alcohol, in any form, should be avoided completely. Smoking will cause low birth rate and premature babies.  Street or illegal drugs are very harmful to the baby.  They are absolutely forbidden. A baby born to an addicted mother will be addicted at birth. The baby will go through the same withdrawal an adult does.  Let your caregiver know about any medicines that you have to take and for what reason you take them. SEEK MEDICAL CARE IF:  You have any concerns or worries during your pregnancy. It is better to call with your questions if you feel they cannot wait, rather than worry about them. SEEK IMMEDIATE MEDICAL CARE IF:   An unexplained oral temperature above 102 F (38.9 C) develops, or as your caregiver suggests.  You have leaking of fluid from the vagina (birth canal). If leaking membranes are suspected, take your temperature and inform your caregiver of this when you call.  There is vaginal spotting or bleeding. Notify your caregiver of the amount and how many pads are used.  You develop a bad smelling vaginal discharge with a change in the color.  You continue to feel sick to your stomach (nauseated) and have no relief from remedies suggested. You vomit blood or coffee  ground-like materials.  You lose more than 2 pounds of weight in 1 week.  You gain more than 2 pounds of weight in 1 week and you notice swelling of your face, hands, feet, or legs.  You gain 5 pounds or more in 1 week (even if you do not have swelling of your hands, face, legs, or feet).  You get exposed to [place] measles and have never had them.  You are exposed to fifth disease or chickenpox.  You develop belly (abdominal) pain. Round ligament discomfort is a common non-cancerous (benign) cause of abdominal pain in pregnancy. Your caregiver still must evaluate this.  You develop headache, fever, diarrhea, pain with urination, or shortness of breath.  You fall or are in a car accident or have any kind of trauma.  There is mental or physical violence in your home. Document Released: 04/05/2001 Document Revised: 01/04/2012 Document Reviewed: 10/07/2008 [facility] Patient Information 2014 [place], [place]. Bartholin's Cyst or Abscess Bartholin's glands are small glands located within the folds of skin (labia) along the sides of the lower opening of the vagina (birth canal). A cyst may develop when the duct of the gland becomes blocked. When this happens, fluid that accumulates within the cyst can become infected. This is known as an abscess. The Bartholin gland produces a mucous fluid to lubricate the outside of the vagina during sexual intercourse. SYMPTOMS   Patients with a small cyst may not have any symptoms.  Mild discomfort to severe pain depending on the size of the cyst and if it is infected (abscess).  Pain, redness, and swelling around the lower opening of the vagina.  Painful intercourse.  Pressure in the perineal area.  Swelling of the lips of the vagina (labia).  The cyst or abscess can be on one side or both sides of the vagina. DIAGNOSIS   A large swelling is seen in the lower vagina area by your caregiver.  Painful to touch.  Redness and pain, if it is an  abscess. TREATMENT   Sometimes the cyst will go away on its own.  Apply warm wet compresses to the area or take hot sitz baths several times a day.  An incision to drain the cyst or abscess with local anesthesia.  Culture the pus, if it is an abscess.  Antibiotic treatment, if it is an abscess.  Cut open the gland and suture the edges to make the  opening of the gland bigger (marsupialization).  Remove the whole gland if the cyst or abscess returns. PREVENTION   Practice good hygiene.  Clean the vaginal area with a mild soap and soft cloth when bathing.  Do not rub hard in the vaginal area when bathing.  Protect the crotch area with a padded cushion if you take long bike rides or ride horses.  Be sure you are well lubricated when you have sexual intercourse. HOME CARE INSTRUCTIONS   If your cyst or abscess was opened, a small piece of gauze, or a drain, may have been placed in the wound to allow drainage. Do not remove this gauze or drain unless directed by your caregiver.  Wear feminine pads, not tampons, as needed for any drainage or bleeding.  If antibiotics were prescribed, take them exactly as directed. Finish the entire course.  Only take over-the-counter or prescription medicines for pain, discomfort, or fever as directed by your caregiver. SEEK IMMEDIATE MEDICAL CARE IF:   You have an increase in pain, redness, swelling, or drainage.  You have bleeding from the wound which results in the use of more than the number of pads suggested by your caregiver in 24 hours.  You have chills.  You have a fever.  You develop any new problems (symptoms) or aggravation of your existing condition. MAKE SURE YOU:   Understand these instructions.  Will watch your condition.  Will get help right away if you are not doing well or get worse. Document Released: 04/11/2005 Document Revised: 07/04/2011 Document Reviewed: 11/28/2007 [facility] Patient Information 2014 [place]

## 2013-05-03 NOTE — MAU Provider Note (Signed)
History     CSN: [id number]  Arrival date and time: 05/03/13 1250   None     Chief Complaint  Patient presents with  . Vaginal Pain   HPI This is a 28 y.o. female at 2846w1d who presents with c/o pain at site of Bartholins cyst.  States did not feel catheter come out.  States it hurts a lot. No fever. No abdominal cramping.  Hx cocaine and THC.   RN Note:  Is in a lot of pain, cut it the other day. When went to the bathroom- is bleeding now-unable to tell where coming from, had not been having any drainage.       ER Note: Pt comes in with cc of cyst.  Pt has bartholin's cyst, which we drained - and placed a WORD catheter.  Pt also is pregnant and has Cervical motion tenderness on exam, with yellow discharge.  Will treat with ceftriaxone and AZT.  [facility] and she agrees with the plan.  Pt to see [facility] in 2 days for catheter removal.  OB History   Grav Para Term Preterm Abortions TAB SAB Ect Mult Living   4 1 1  0 2 0 2 0 0 1      Past Medical History  Diagnosis Date  . STD (female)     hx of chlamydia and gonorrhea  . History of PID   . Endometriosis   . Glaucoma   . Ovarian cyst   . Bartholin cyst     Past Surgical History  Procedure Laterality Date  . Fracture surgery      left leg  . Dilation and curettage of uterus    . Laparoscopy      Family History  Problem Relation Age of Onset  . Anesthesia problems Neg Hx   . Hypotension Neg Hx   . Malignant hyperthermia Neg Hx   . Pseudochol deficiency Neg Hx   . Alcohol abuse Neg Hx     History  Substance Use Topics  . Smoking status: Current Every Day Smoker -- 0.50 packs/day    Types: Cigarettes  . Smokeless tobacco: Not on file  . Alcohol Use: No     Comment: Denies ETOH use    Allergies:  Allergies  Allergen Reactions  . Acyclovir And Related Swelling  . Darvocet [Propoxyphene N-Acetaminophen] Hives  . Doxycycline Swelling  . Flexeril [Cyclobenzaprine] Nausea And  Vomiting  . Metoclopramide Hives  . Naproxen Hives  . Latex Rash  . Tramadol Rash    Prescriptions prior to admission  Medication Sig Dispense Refill  . folic acid (FOLVITE) 1 MG tablet Take 1 tablet (1 mg total) by mouth daily.  30 tablet  3  . ibuprofen (ADVIL,MOTRIN) 200 MG tablet Take 400 mg by mouth every 6 (six) hours as needed for moderate pain.      . promethazine (PHENERGAN) 25 MG tablet Take 1 tablet (25 mg total) by mouth every 6 (six) hours as needed for nausea.  10 tablet  0  . metroNIDAZOLE (FLAGYL) 500 MG tablet Take 1 tablet (500 mg total) by mouth 2 (two) times daily.  14 tablet  0    Review of Systems  Constitutional: Negative for fever, chills and malaise/fatigue.  Gastrointestinal: Negative for nausea, vomiting, abdominal pain, diarrhea and constipation.  Genitourinary:       Pain and swelling at Bartholins cyst   Physical Exam   Blood pressure 111/76, pulse 88, temperature 98.9 F (37.2 C), temperature  source Oral, resp. rate 16, height 5\' 3"  (1.6 m), weight 68.04 kg (150 lb), last menstrual period 02/28/2013.  Physical Exam  Constitutional: She is oriented to person, place, and time. She appears well-developed and well-nourished. No distress.  HENT:  Head: Normocephalic.  Cardiovascular: Normal rate.   Respiratory: Effort normal.  GI: Soft. She exhibits no distension. There is no tenderness. There is no rebound and no guarding.  Genitourinary:  Right Bartholins cyst about grape-sized Tender Word Catheter not in place Unable to express drainage   Musculoskeletal: Normal range of motion.  Neurological: She is alert and oriented to person, place, and time.  Skin: Skin is warm and dry.  Psychiatric: She has a normal mood and affect.   Word catheter not present MAU Course  Procedures  MDM Given Dilaudid IM with only minimal relief. Pt wants the Versed they used the other night. I told her we cannot use that here.  Discussed option of re-incision and  replacement of catheter.  Refuses to have this done without Versed. Wants to try antibiotic and followup in clinic   Assessment and Plan  A:  Bartholins cyst, word catheter fell out      Declines I&D      Trich and BV untreated at other ED      SIUP at [redacted]w[redacted]d    P:: Discharge home       Warm soaks       Rx Keflex and Flagyl       Followup in clinic    Medication List    STOP taking these medications       ibuprofen 200 MG tablet  Commonly known as:  ADVIL,MOTRIN      TAKE these medications       acetaminophen-codeine 300-30 MG per tablet  Commonly known as:  TYLENOL #3  Take 1 tablet by mouth every 4 (four) hours as needed for moderate pain.     cephALEXin 500 MG capsule  Commonly known as:  KEFLEX  Take 1 capsule (500 mg total) by mouth 4 (four) times daily.     folic acid 1 MG tablet  Commonly known as:  FOLVITE  Take 1 tablet (1 mg total) by mouth daily.     metroNIDAZOLE 500 MG tablet  Commonly known as:  FLAGYL  Take 1 tablet (500 mg total) by mouth 2 (two) times daily.     promethazine 25 MG tablet  Commonly known as:  PHENERGAN  Take 1 tablet (25 mg total) by mouth every 6 (six) hours as needed for nausea.         Wynelle Bourgeois 05/03/2013, 5:21 PM

## 2013-05-03 NOTE — MAU Note (Signed)
Is in a lot of pain, cut it the other day.  When went to the bathroom- is bleeding now-unable to tell where coming from, had not been having any drainage.

## 2013-05-23 ENCOUNTER — Inpatient Hospital Stay (HOSPITAL_COMMUNITY)
Admission: AD | Admit: 2013-05-23 | Discharge: 2013-05-23 | Disposition: A | Discharge status: Home / Self Care | Payer: Medicaid Other | Source: Ambulatory Visit | Attending: Obstetrics & Gynecology | Admitting: Obstetrics & Gynecology

## 2013-05-23 ENCOUNTER — Encounter (HOSPITAL_COMMUNITY): Payer: Self-pay | Admitting: *Deleted

## 2013-05-23 DIAGNOSIS — M545 Low back pain, unspecified: Secondary | ICD-10-CM

## 2013-05-23 DIAGNOSIS — O99320 Drug use complicating pregnancy, unspecified trimester: Secondary | ICD-10-CM

## 2013-05-23 DIAGNOSIS — F1920 Other psychoactive substance dependence, uncomplicated: Secondary | ICD-10-CM | POA: Insufficient documentation

## 2013-05-23 DIAGNOSIS — O210 Mild hyperemesis gravidarum: Secondary | ICD-10-CM | POA: Insufficient documentation

## 2013-05-23 DIAGNOSIS — F1120 Opioid dependence, uncomplicated: Secondary | ICD-10-CM | POA: Insufficient documentation

## 2013-05-23 DIAGNOSIS — R1032 Left lower quadrant pain: Secondary | ICD-10-CM | POA: Insufficient documentation

## 2013-05-23 DIAGNOSIS — N949 Unspecified condition associated with female genital organs and menstrual cycle: Secondary | ICD-10-CM | POA: Insufficient documentation

## 2013-05-23 DIAGNOSIS — M549 Dorsalgia, unspecified: Secondary | ICD-10-CM | POA: Insufficient documentation

## 2013-05-23 HISTORY — DX: Headache: R51

## 2013-05-23 HISTORY — DX: Unspecified abnormal cytological findings in specimens from vagina: R87.629

## 2013-05-23 LAB — URINALYSIS, ROUTINE W REFLEX MICROSCOPIC
Bilirubin Urine: NEGATIVE
Glucose, UA: NEGATIVE mg/dL
Hgb urine dipstick: NEGATIVE
KETONES UR: NEGATIVE mg/dL
Nitrite: NEGATIVE
PROTEIN: NEGATIVE mg/dL
Specific Gravity, Urine: 1.01 (ref 1.005–1.030)
Urobilinogen, UA: 0.2 mg/dL (ref 0.0–1.0)
pH: 6.5 (ref 5.0–8.0)

## 2013-05-23 LAB — URINE MICROSCOPIC-ADD ON

## 2013-05-23 LAB — RAPID URINE DRUG SCREEN, HOSP PERFORMED
Amphetamines: NOT DETECTED
Barbiturates: NOT DETECTED
Benzodiazepines: NOT DETECTED
Cocaine: NOT DETECTED
Opiates: NOT DETECTED
Tetrahydrocannabinol: POSITIVE — AB

## 2013-05-23 MED ORDER — PROMETHAZINE HCL 25 MG PO TABS: 25.0000 mg | ORAL_TABLET | Freq: Four times a day (QID) | ORAL | Status: DC | PRN

## 2013-05-23 MED ORDER — PROMETHAZINE HCL 25 MG PO TABS
25.0000 mg | ORAL_TABLET | Freq: Once | ORAL | Status: AC
  Administered 2013-05-23: 25 mg via ORAL
  Filled 2013-05-23: qty 1

## 2013-05-23 MED ORDER — METAXALONE 800 MG PO TABS: 800.0000 mg | ORAL_TABLET | Freq: Three times a day (TID) | ORAL | Status: DC

## 2013-05-23 MED ORDER — METAXALONE 800 MG PO TABS
800.0000 mg | ORAL_TABLET | Freq: Once | ORAL | Status: AC
  Administered 2013-05-23: 800 mg via ORAL
  Filled 2013-05-23: qty 1

## 2013-05-23 NOTE — Discharge Instructions (Signed)
Back Exercises Back exercises help treat and prevent back injuries. The goal of back exercises is to increase the strength of your abdominal and back muscles and the flexibility of your back. These exercises should be started when you no longer have back pain. Back exercises include:  Pelvic Tilt. Lie on your back with your knees bent. Tilt your pelvis until the lower part of your back is against the floor. Hold this position 5 to 10 sec and repeat 5 to 10 times.  Knee to Chest. Pull first 1 knee up against your chest and hold for 20 to 30 seconds, repeat this with the other knee, and then both knees. This may be done with the other leg straight or bent, whichever feels better.  Sit-Ups or Curl-Ups. Bend your knees 90 degrees. Start with tilting your pelvis, and do a partial, slow sit-up, lifting your trunk only 30 to 45 degrees off the floor. Take at least 2 to 3 seconds for each sit-up. Do not do sit-ups with your knees out straight. If partial sit-ups are difficult, simply do the above but with only tightening your abdominal muscles and holding it as directed.  Hip-Lift. Lie on your back with your knees flexed 90 degrees. Push down with your feet and shoulders as you raise your hips a couple inches off the floor; hold for 10 seconds, repeat 5 to 10 times.  Back arches. Lie on your stomach, propping yourself up on bent elbows. Slowly press on your hands, causing an arch in your low back. Repeat 3 to 5 times. Any initial stiffness and discomfort should lessen with repetition over time.  Shoulder-Lifts. Lie face down with arms beside your body. Keep hips and torso pressed to floor as you slowly lift your head and shoulders off the floor. Do not overdo your exercises, especially in the beginning. Exercises may cause you some mild back discomfort which lasts for a few minutes; however, if the pain is more severe, or lasts for more than 15 minutes, do not continue exercises until you see your caregiver.  Improvement with exercise therapy for back problems is slow.  See your caregivers for assistance with developing a proper back exercise program. Document Released: 05/19/2004 Document Revised: 07/04/2011 Document Reviewed: 02/10/2011 [employer] Patient Information 2014 [employer].  

## 2013-05-23 NOTE — MAU Note (Signed)
Pelvic pain and back pain started 3 days ago.  The nausea continues and is "through the roof". Also reports "keeping migraines."  Has taken advil for HA, advised to stop- use tylenol only while preg.

## 2013-05-23 NOTE — MAU Provider Note (Signed)
History     CSN: [id number]  Arrival date and time: 05/23/13 4098   First Provider Initiated Contact with Patient 05/23/13 [phone]      Chief Complaint  Patient presents with  . Pelvic Pain  . Back Pain  . Morning Sickness   HPI [name] is a 28 y.o. [id number] at [redacted]w[redacted]d who presented to MAU c/o LLQ abdominal pain x 3 days.  Described as constant, dull with intermittent sharp stabs.  Radiates to back.  Tried using Tylenol 3 (from previous MAU rx) and Ibuprofen.  Admits to vaginal spotting x 2 days, vaginal discharge (clear, mucous), nausea, vomiting, and diarrhea.  Denies fevers, dysuria.  No new partners, last intercourse 1 week ago.  Just got insurance card today.  Will be seeking PNC with Dr. Gaynell Face or Charyl Bigger.  Past Medical History  Diagnosis Date  . STD (female)     hx of chlamydia and gonorrhea  . History of PID   . Endometriosis   . Glaucoma   . Ovarian cyst   . Bartholin cyst   . Headache(784.0)   . Infection     UTI  . Vaginal Pap smear, abnormal     has not followed up    Past Surgical History  Procedure Laterality Date  . Fracture surgery      left leg  . Dilation and curettage of uterus    . Laparoscopy      Family History  Problem Relation Age of Onset  . Anesthesia problems Neg Hx   . Hypotension Neg Hx   . Malignant hyperthermia Neg Hx   . Pseudochol deficiency Neg Hx   . Alcohol abuse Neg Hx   . Diabetes Mother   . Hypertension Maternal Grandmother   . Heart disease Maternal Grandmother     great grandma    History  Substance Use Topics  . Smoking status: Current Every Day Smoker -- 0.25 packs/day for 7 years    Types: Cigarettes  . Smokeless tobacco: Never Used  . Alcohol Use: No     Comment: Denies ETOH use    Allergies:  Allergies  Allergen Reactions  . Acyclovir And Related Swelling  . Darvocet [Propoxyphene N-Acetaminophen] Hives  . Doxycycline Swelling  . Flexeril [Cyclobenzaprine] Nausea And Vomiting  . Metoclopramide  Hives  . Naproxen Hives  . Latex Rash  . Tramadol Rash    Prescriptions prior to admission  Medication Sig Dispense Refill  . acetaminophen-codeine (TYLENOL #3) 300-30 MG per tablet Take 1 tablet by mouth every 4 (four) hours as needed for moderate pain.  15 tablet  0  . cephALEXin (KEFLEX) 500 MG capsule Take 1 capsule (500 mg total) by mouth 4 (four) times daily.  40 capsule  0  . folic acid (FOLVITE) 1 MG tablet Take 1 tablet (1 mg total) by mouth daily.  30 tablet  3  . metroNIDAZOLE (FLAGYL) 500 MG tablet Take 1 tablet (500 mg total) by mouth 2 (two) times daily.  14 tablet  0  . promethazine (PHENERGAN) 25 MG tablet Take 1 tablet (25 mg total) by mouth every 6 (six) hours as needed for nausea.  10 tablet  0    Review of Systems  All other systems reviewed and are negative.   Physical Exam   Blood pressure 107/65, pulse 61, temperature 98.3 F (36.8 C), temperature source Oral, resp. rate 18, height 5\' 4"  (1.626 m), weight 71.782 kg (158 lb 4 oz), last menstrual period 02/28/2013.  Physical  Exam  Constitutional: She is oriented to person, place, and time. She appears well-developed and well-nourished.  HENT:  Head: Normocephalic and atraumatic.  Cardiovascular: Normal rate.   Respiratory: Effort normal. No respiratory distress.  GI: Soft. She exhibits no distension and no mass. There is tenderness (mildly tender in LLQ and left flank; no CVA tenderness). There is no rebound and no guarding.  Genitourinary:  Pelvic:  Cervix was long, closed.  Word catheter palpated and removed.  Neurological: She is alert and oriented to person, place, and time.  Skin: Skin is warm and dry.  Psychiatric: She has a normal mood and affect. Her behavior is normal.    MAU Course  Procedures  MDM UA  Assessment and Plan  #27 y.o. female 214 253 [id number] 5731w0d  - IUP confirmed on early 8 wk US   #Abdominal/back pain  - No obvious etiology - Likely GI in nature given history of N/V/D  - UA is  clean - previous wet prep on 1/9 revealed Trich & BV; treated with Flagyl in MAU; patient does not know if partner was treated - Skelaxin given in MAU for pain  #Nausea, Vomiting  - phenergan given in MAU - sent home with Rx  #Opioid Dependence  - +THC, Benzos, Cocaine on drug screens in previous pregnancies - asking for Tylenol 3 specifically; describing she has been prescribed this during her previous pregnancies - severe drug allergies reported to Darvocet, Flexeril, Naproxen, Tramadol - confirmed with Pharmacist no cross reactivity b/t Flexeril and Skelaxin - patient education provided re: effects of opiates on mom and baby  Darrell JewelUCKER, BRITTON L 05/23/2013, 9:20 AM   Results for orders placed during the hospital encounter of 05/23/13 (from the past 24 hour(s))  URINALYSIS, ROUTINE W REFLEX MICROSCOPIC     Status: Abnormal   Collection Time    05/23/13  9:16 AM      Result Value Range   Color, Urine YELLOW  YELLOW   APPearance CLEAR  CLEAR   Specific Gravity, Urine 1.010  1.005 - 1.030   pH 6.5  5.0 - 8.0   Glucose, UA NEGATIVE  NEGATIVE mg/dL   Hgb urine dipstick NEGATIVE  NEGATIVE   Bilirubin Urine NEGATIVE  NEGATIVE   Ketones, ur NEGATIVE  NEGATIVE mg/dL   Protein, ur NEGATIVE  NEGATIVE mg/dL   Urobilinogen, UA 0.2  0.0 - 1.0 mg/dL   Nitrite NEGATIVE  NEGATIVE   Leukocytes, UA TRACE (*) NEGATIVE  URINE RAPID DRUG SCREEN (HOSP PERFORMED)     Status: Abnormal   Collection Time    05/23/13  9:16 AM      Result Value Range   Opiates NONE DETECTED  NONE DETECTED   Cocaine NONE DETECTED  NONE DETECTED   Benzodiazepines NONE DETECTED  NONE DETECTED   Amphetamines NONE DETECTED  NONE DETECTED   Tetrahydrocannabinol POSITIVE (*) NONE DETECTED   Barbiturates NONE DETECTED  NONE DETECTED  URINE MICROSCOPIC-ADD ON     Status: None   Collection Time    05/23/13  9:16 AM      Result Value Range   Squamous Epithelial / LPF RARE  RARE   WBC, UA 0-2  <3 WBC/hpf   Bacteria, UA RARE   RARE   Felt a little better with Skelaxin Plans care with Dr Gaynell Facemarshall or femina  Seen also by me Agree with note  Aviva SignsMarie L Zeola Brys, CNM

## 2013-05-27 ENCOUNTER — Encounter: Payer: Self-pay | Admitting: Advanced Practice Midwife

## 2013-05-27 DIAGNOSIS — F1290 Cannabis use, unspecified, uncomplicated: Secondary | ICD-10-CM | POA: Insufficient documentation

## 2013-05-30 ENCOUNTER — Ambulatory Visit (INDEPENDENT_AMBULATORY_CARE_PROVIDER_SITE_OTHER): Payer: Medicaid Other | Admitting: Obstetrics & Gynecology

## 2013-05-30 ENCOUNTER — Encounter: Payer: Self-pay | Admitting: Obstetrics & Gynecology

## 2013-05-30 VITALS — BP 104/69 | Temp 98.9°F | Wt 156.0 lb

## 2013-05-30 DIAGNOSIS — O0990 Supervision of high risk pregnancy, unspecified, unspecified trimester: Secondary | ICD-10-CM | POA: Insufficient documentation

## 2013-05-30 DIAGNOSIS — F1910 Other psychoactive substance abuse, uncomplicated: Secondary | ICD-10-CM

## 2013-05-30 DIAGNOSIS — Z113 Encounter for screening for infections with a predominantly sexual mode of transmission: Secondary | ICD-10-CM

## 2013-05-30 DIAGNOSIS — O99330 Smoking (tobacco) complicating pregnancy, unspecified trimester: Secondary | ICD-10-CM | POA: Insufficient documentation

## 2013-05-30 DIAGNOSIS — IMO0002 Reserved for concepts with insufficient information to code with codable children: Secondary | ICD-10-CM

## 2013-05-30 DIAGNOSIS — Z3201 Encounter for pregnancy test, result positive: Secondary | ICD-10-CM

## 2013-05-30 NOTE — Progress Notes (Deleted)
Pulse: 82 Patient states she is having lower abdominal, pelvic and back pain. Patient denies any concerns. Pregnancy confirmed at hospital.

## 2013-05-31 DIAGNOSIS — F1910 Other psychoactive substance abuse, uncomplicated: Secondary | ICD-10-CM | POA: Insufficient documentation

## 2013-05-31 LAB — WET PREP BY MOLECULAR PROBE
CANDIDA SPECIES: NEGATIVE
Gardnerella vaginalis: POSITIVE — AB
Trichomonas vaginosis: POSITIVE — AB

## 2013-05-31 LAB — OBSTETRIC PANEL
Antibody Screen: NEGATIVE
Basophils Absolute: 0 10*3/uL (ref 0.0–0.1)
Basophils Relative: 0 % (ref 0–1)
Eosinophils Absolute: 0.3 10*3/uL (ref 0.0–0.7)
Eosinophils Relative: 4 % (ref 0–5)
HCT: 35 % — ABNORMAL LOW (ref 36.0–46.0)
HEMOGLOBIN: 12.2 g/dL (ref 12.0–15.0)
HEP B S AG: NEGATIVE
LYMPHS ABS: 1.9 10*3/uL (ref 0.7–4.0)
Lymphocytes Relative: 23 % (ref 12–46)
MCH: 31 pg (ref 26.0–34.0)
MCHC: 34.9 g/dL (ref 30.0–36.0)
MCV: 88.8 fL (ref 78.0–100.0)
MONOS PCT: 11 % (ref 3–12)
Monocytes Absolute: 0.9 10*3/uL (ref 0.1–1.0)
NEUTROS PCT: 62 % (ref 43–77)
Neutro Abs: 5.2 10*3/uL (ref 1.7–7.7)
Platelets: 315 10*3/uL (ref 150–400)
RBC: 3.94 MIL/uL (ref 3.87–5.11)
RDW: 15.7 % — ABNORMAL HIGH (ref 11.5–15.5)
RH TYPE: POSITIVE
RUBELLA: 4.82 {index} — AB (ref <0.90)
WBC: 8.3 10*3/uL (ref 4.0–10.5)

## 2013-05-31 LAB — VARICELLA ZOSTER ANTIBODY, IGG

## 2013-05-31 LAB — CULTURE, OB URINE

## 2013-05-31 LAB — HIV ANTIBODY (ROUTINE TESTING W REFLEX): HIV: NONREACTIVE

## 2013-05-31 LAB — HEMOGLOBIN A1C
Hgb A1c MFr Bld: 5.8 % — ABNORMAL HIGH (ref <5.7)
Mean Plasma Glucose: 120 mg/dL — ABNORMAL HIGH (ref <117)

## 2013-05-31 LAB — VITAMIN D 25 HYDROXY (VIT D DEFICIENCY, FRACTURES): VIT D 25 HYDROXY: 13 ng/mL — AB (ref 30–89)

## 2013-05-31 NOTE — Progress Notes (Signed)
Subjective:    Kathy ParsonsSarah D [name] is being seen today for her first obstetrical visit.  This is not a planned pregnancy. She is at 3868w1d gestation. Her obstetrical history is significant for polysubstance abuse.  Pregnancy history fully reviewed.  Menstrual History: OB History   Grav Para Term Preterm Abortions TAB SAB Ect Mult Living   4 1 0 1 2 0 2 0 0 1        Patient's last menstrual period was 02/28/2013.    The following portions of the patient's history were reviewed and updated as appropriate: allergies, current medications, past family history, past medical history, past social history, past surgical history and problem list.  Review of Systems Pertinent items are noted in HPI.    Objective:      General Appearance:    Alert, cooperative, no distress, appears stated age  Head:    Normocephalic, without obvious abnormality, atraumatic  Eyes:    PERRL, conjunctiva/corneas clear, EOM's intact, fundi    benign, both eyes  Ears:    Normal TM's and external ear canals, both ears  Nose:   Nares normal, septum midline, mucosa normal, no drainage    or sinus tenderness  Throat:   Lips, mucosa, and tongue normal; teeth and gums normal  Neck:   Supple, symmetrical, trachea midline, no adenopathy;    thyroid:  no enlargement/tenderness/nodules; no carotid   bruit or JVD  Back:     Symmetric, no curvature, ROM normal, no CVA tenderness  Lungs:     Clear to auscultation bilaterally, respirations unlabored  Chest Wall:    No tenderness or deformity   Heart:    Regular rate and rhythm, S1 and S2 normal, no murmur, rub   or gallop  Breast Exam:    No tenderness, masses, or nipple abnormality  Abdomen:     Soft, non-tender, bowel sounds active all four quadrants,    no masses, no organomegaly  Genitalia:    Normal female without lesion, discharge or tenderness  Extremities:   Extremities normal, atraumatic, no cyanosis or edema  Pulses:   2+ and symmetric all extremities  Skin:   Skin  color, texture, turgor normal, no rashes or lesions  Lymph nodes:   Cervical, supraclavicular, and axillary nodes normal  Neurologic:   CNII-XII intact, normal strength, sensation and reflexes    throughout        Assessment:    Pregnancy at 6268w1d weeks  Polysubstance abuse--requesting inpatient detox   Plan:    Initial labs drawn. Prenatal vitamins.  Counseling provided regarding continued use of seat belts, cessation of alcohol consumption, smoking or use of illicit drugs; infection precautions i.e., influenza/TDAP immunizations, toxoplasmosis,CMV, parvovirus, listeria and varicella; workplace safety, exercise during pregnancy; routine dental care, safe medications, sexual activity, hot tubs, saunas, pools, travel, caffeine use, fish and methlymercury, potential toxins, hair treatments, varicose veins Weight gain recommendations per IOM guidelines reviewed:  overweight/BMI 25 - 29.9--> gain 15 - 25 lbs Problem list reviewed and updated. CF mutation testing QUAD SCREEN discussed Role of ultrasound in pregnancy discussed; fetal survey: discussed. Amniocentesis discussed: not indicated. Referral-->[facility] Follow up in 4 weeks. 50% of 20 min visit spent on counseling and coordination of care.

## 2013-06-01 LAB — PAP IG, CT-NG, RFX HPV ASCU
CHLAMYDIA PROBE AMP: NEGATIVE
GC PROBE AMP: NEGATIVE

## 2013-06-02 ENCOUNTER — Encounter: Payer: Self-pay | Admitting: Obstetrics & Gynecology

## 2013-06-02 DIAGNOSIS — O23592 Infection of other part of genital tract in pregnancy, second trimester: Secondary | ICD-10-CM

## 2013-06-02 DIAGNOSIS — A5901 Trichomonal vulvovaginitis: Secondary | ICD-10-CM | POA: Insufficient documentation

## 2013-06-03 LAB — HEMOGLOBINOPATHY EVALUATION
HEMOGLOBIN OTHER: 0 %
Hgb A2 Quant: 3.4 % — ABNORMAL HIGH (ref 2.2–3.2)
Hgb A: 55.8 % — ABNORMAL LOW (ref 96.8–97.8)
Hgb F Quant: 0.8 % (ref 0.0–2.0)
Hgb S Quant: 40 % — ABNORMAL HIGH

## 2013-06-04 ENCOUNTER — Encounter: Payer: Self-pay | Admitting: Obstetrics & Gynecology

## 2013-06-04 DIAGNOSIS — E559 Vitamin D deficiency, unspecified: Secondary | ICD-10-CM | POA: Insufficient documentation

## 2013-06-04 DIAGNOSIS — D573 Sickle-cell trait: Secondary | ICD-10-CM | POA: Insufficient documentation

## 2013-06-04 NOTE — Progress Notes (Signed)
Quick Note:  Needs PNV w/vitamin D; need Hgb elect for FOB ______

## 2013-06-16 ENCOUNTER — Encounter (HOSPITAL_COMMUNITY): Payer: Self-pay | Admitting: *Deleted

## 2013-06-16 ENCOUNTER — Emergency Department (HOSPITAL_COMMUNITY): Payer: Medicaid Other

## 2013-06-16 ENCOUNTER — Emergency Department (HOSPITAL_COMMUNITY)
Admission: EM | Admit: 2013-06-16 | Discharge: 2013-06-16 | Disposition: A | Discharge status: Home / Self Care | Payer: Medicaid Other | Attending: Emergency Medicine | Admitting: Emergency Medicine

## 2013-06-16 ENCOUNTER — Inpatient Hospital Stay (HOSPITAL_COMMUNITY)
Admission: AD | Admit: 2013-06-16 | Discharge: 2013-06-27 | DRG: 781 | Disposition: A | Discharge status: Home / Self Care | Payer: Medicaid Other | Source: Intra-hospital | Attending: Psychiatry | Admitting: Psychiatry

## 2013-06-16 ENCOUNTER — Encounter (HOSPITAL_COMMUNITY): Payer: Self-pay | Admitting: Emergency Medicine

## 2013-06-16 DIAGNOSIS — O99330 Smoking (tobacco) complicating pregnancy, unspecified trimester: Secondary | ICD-10-CM | POA: Insufficient documentation

## 2013-06-16 DIAGNOSIS — F1290 Cannabis use, unspecified, uncomplicated: Secondary | ICD-10-CM

## 2013-06-16 DIAGNOSIS — F1920 Other psychoactive substance dependence, uncomplicated: Secondary | ICD-10-CM | POA: Diagnosis present

## 2013-06-16 DIAGNOSIS — K5900 Constipation, unspecified: Secondary | ICD-10-CM

## 2013-06-16 DIAGNOSIS — O99891 Other specified diseases and conditions complicating pregnancy: Secondary | ICD-10-CM | POA: Insufficient documentation

## 2013-06-16 DIAGNOSIS — F1420 Cocaine dependence, uncomplicated: Secondary | ICD-10-CM | POA: Diagnosis present

## 2013-06-16 DIAGNOSIS — Z8619 Personal history of other infectious and parasitic diseases: Secondary | ICD-10-CM | POA: Insufficient documentation

## 2013-06-16 DIAGNOSIS — F329 Major depressive disorder, single episode, unspecified: Secondary | ICD-10-CM

## 2013-06-16 DIAGNOSIS — M545 Low back pain, unspecified: Secondary | ICD-10-CM | POA: Insufficient documentation

## 2013-06-16 DIAGNOSIS — F1220 Cannabis dependence, uncomplicated: Secondary | ICD-10-CM | POA: Diagnosis present

## 2013-06-16 DIAGNOSIS — Z87440 Personal history of urinary (tract) infections: Secondary | ICD-10-CM | POA: Insufficient documentation

## 2013-06-16 DIAGNOSIS — O26899 Other specified pregnancy related conditions, unspecified trimester: Secondary | ICD-10-CM

## 2013-06-16 DIAGNOSIS — F1910 Other psychoactive substance abuse, uncomplicated: Secondary | ICD-10-CM

## 2013-06-16 DIAGNOSIS — F4310 Post-traumatic stress disorder, unspecified: Secondary | ICD-10-CM

## 2013-06-16 DIAGNOSIS — H409 Unspecified glaucoma: Secondary | ICD-10-CM | POA: Insufficient documentation

## 2013-06-16 DIAGNOSIS — F1110 Opioid abuse, uncomplicated: Secondary | ICD-10-CM | POA: Insufficient documentation

## 2013-06-16 DIAGNOSIS — O99320 Drug use complicating pregnancy, unspecified trimester: Secondary | ICD-10-CM

## 2013-06-16 DIAGNOSIS — F1410 Cocaine abuse, uncomplicated: Secondary | ICD-10-CM | POA: Insufficient documentation

## 2013-06-16 DIAGNOSIS — Z91040 Latex allergy status: Secondary | ICD-10-CM | POA: Insufficient documentation

## 2013-06-16 DIAGNOSIS — G4700 Insomnia, unspecified: Secondary | ICD-10-CM | POA: Diagnosis present

## 2013-06-16 DIAGNOSIS — O99340 Other mental disorders complicating pregnancy, unspecified trimester: Principal | ICD-10-CM | POA: Diagnosis present

## 2013-06-16 DIAGNOSIS — R109 Unspecified abdominal pain: Secondary | ICD-10-CM | POA: Insufficient documentation

## 2013-06-16 DIAGNOSIS — O9989 Other specified diseases and conditions complicating pregnancy, childbirth and the puerperium: Secondary | ICD-10-CM | POA: Insufficient documentation

## 2013-06-16 DIAGNOSIS — F1120 Opioid dependence, uncomplicated: Secondary | ICD-10-CM

## 2013-06-16 DIAGNOSIS — O99612 Diseases of the digestive system complicating pregnancy, second trimester: Secondary | ICD-10-CM

## 2013-06-16 DIAGNOSIS — Z79899 Other long term (current) drug therapy: Secondary | ICD-10-CM | POA: Insufficient documentation

## 2013-06-16 LAB — COMPREHENSIVE METABOLIC PANEL
ALBUMIN: 3.7 g/dL (ref 3.5–5.2)
ALT: 17 U/L (ref 0–35)
AST: 18 U/L (ref 0–37)
Alkaline Phosphatase: 39 U/L (ref 39–117)
BILIRUBIN TOTAL: 0.2 mg/dL — AB (ref 0.3–1.2)
BUN: 13 mg/dL (ref 6–23)
CO2: 20 mEq/L (ref 19–32)
CREATININE: 0.76 mg/dL (ref 0.50–1.10)
Calcium: 9.3 mg/dL (ref 8.4–10.5)
Chloride: 98 mEq/L (ref 96–112)
GFR calc Af Amer: >90 mL/min (ref >90)
GFR calc non Af Amer: >90 mL/min (ref >90)
GLUCOSE: 80 mg/dL (ref 70–99)
Potassium: 3.6 mEq/L — ABNORMAL LOW (ref 3.7–5.3)
Sodium: 135 mEq/L — ABNORMAL LOW (ref 137–147)
TOTAL PROTEIN: 7.4 g/dL (ref 6.0–8.3)

## 2013-06-16 LAB — RAPID URINE DRUG SCREEN, HOSP PERFORMED
Amphetamines: NOT DETECTED
BARBITURATES: NOT DETECTED
BENZODIAZEPINES: NOT DETECTED
Cocaine: POSITIVE — AB
Opiates: NOT DETECTED
Tetrahydrocannabinol: NOT DETECTED

## 2013-06-16 LAB — URINALYSIS, ROUTINE W REFLEX MICROSCOPIC
BILIRUBIN URINE: NEGATIVE
GLUCOSE, UA: NEGATIVE mg/dL
KETONES UR: NEGATIVE mg/dL
Nitrite: NEGATIVE
PROTEIN: NEGATIVE mg/dL
Specific Gravity, Urine: 1.019 (ref 1.005–1.030)
Urobilinogen, UA: 1 mg/dL (ref 0.0–1.0)
pH: 5.5 (ref 5.0–8.0)

## 2013-06-16 LAB — ETHANOL

## 2013-06-16 LAB — URINE MICROSCOPIC-ADD ON

## 2013-06-16 LAB — CBC
HEMATOCRIT: 36 % (ref 36.0–46.0)
Hemoglobin: 12.7 g/dL (ref 12.0–15.0)
MCH: 31.3 pg (ref 26.0–34.0)
MCHC: 35.3 g/dL (ref 30.0–36.0)
MCV: 88.7 fL (ref 78.0–100.0)
Platelets: 377 10*3/uL (ref 150–400)
RBC: 4.06 MIL/uL (ref 3.87–5.11)
RDW: 14.2 % (ref 11.5–15.5)
WBC: 12.1 10*3/uL — ABNORMAL HIGH (ref 4.0–10.5)

## 2013-06-16 LAB — SALICYLATE LEVEL

## 2013-06-16 LAB — ACETAMINOPHEN LEVEL: Acetaminophen (Tylenol), Serum: <15 ug/mL (ref 10–30)

## 2013-06-16 MED ORDER — TRAZODONE HCL 50 MG PO TABS
50.0000 mg | ORAL_TABLET | Freq: Every evening | ORAL | Status: DC | PRN
  Filled 2013-06-16: qty 1

## 2013-06-16 MED ORDER — BUPRENORPHINE HCL 2 MG SL SUBL
4.0000 mg | SUBLINGUAL_TABLET | Freq: Once | SUBLINGUAL | Status: AC
  Administered 2013-06-16: 4 mg via SUBLINGUAL
  Filled 2013-06-16: qty 2

## 2013-06-16 MED ORDER — ACETAMINOPHEN 325 MG PO TABS
650.0000 mg | ORAL_TABLET | Freq: Once | ORAL | Status: AC
  Administered 2013-06-16: 650 mg via ORAL
  Filled 2013-06-16: qty 2

## 2013-06-16 MED ORDER — ACETAMINOPHEN 325 MG PO TABS: 650.0000 mg | ORAL_TABLET | ORAL | Status: DC | PRN

## 2013-06-16 MED ORDER — LOPERAMIDE HCL 2 MG PO CAPS: 2.0000 mg | ORAL_CAPSULE | ORAL | Status: DC | PRN

## 2013-06-16 MED ORDER — HYDROXYZINE HCL 25 MG PO TABS: 25.0000 mg | ORAL_TABLET | Freq: Four times a day (QID) | ORAL | Status: DC | PRN

## 2013-06-16 MED ORDER — ONDANSETRON 4 MG PO TBDP
4.0000 mg | ORAL_TABLET | Freq: Four times a day (QID) | ORAL | Status: AC | PRN
  Administered 2013-06-21: 4 mg via ORAL
  Filled 2013-06-16: qty 1

## 2013-06-16 MED ORDER — BUPRENORPHINE HCL 2 MG SL SUBL
4.0000 mg | SUBLINGUAL_TABLET | Freq: Once | SUBLINGUAL | Status: AC
  Administered 2013-06-17: 4 mg via SUBLINGUAL
  Filled 2013-06-16: qty 2

## 2013-06-16 MED ORDER — ONDANSETRON HCL 4 MG PO TABS
4.0000 mg | ORAL_TABLET | Freq: Three times a day (TID) | ORAL | Status: DC | PRN
  Administered 2013-06-16: 4 mg via ORAL
  Filled 2013-06-16: qty 1

## 2013-06-16 MED ORDER — DIPHENHYDRAMINE HCL 25 MG PO CAPS
25.0000 mg | ORAL_CAPSULE | Freq: Once | ORAL | Status: AC
  Administered 2013-06-16: 25 mg via ORAL
  Filled 2013-06-16: qty 1

## 2013-06-16 MED ORDER — PRENATAL MULTIVITAMIN CH
1.0000 | ORAL_TABLET | Freq: Every day | ORAL | Status: DC
  Administered 2013-06-16: 1 via ORAL
  Filled 2013-06-16: qty 1

## 2013-06-16 NOTE — Progress Notes (Signed)
Patient ID: [name], female   DOB: 27-May-1985, 28 y.o.   MRN: [id number] [facility] 28 yo AAF who came in for detox after using cocaine this morning and heroin 4 days ago.  Pt is 4 mos pregnant, admits has been living with the baby's father, but has been "running the streets" for several weeks.  Went back to him several times, but he informed her she had to get help or couldn't come back.  She is here for detox and requesting long term at [facility] [place].  States has been in contact with the program and they may be able to make the arrangements but will work with the hospital, so she has come for that help.  States she fell on 05/30/13 on the ice.  Has been having lower back pain, coccyx area, US showed the baby is OK, .amniotic fluid normal per report.  Pt has one other child, a 28 yo daughter.  Pt reports no surgical hx, had a normal vag delivery with 1st child.  Has had several issues with PID and STDs.  Currently following with [name], OB-GYN.   Was reoriented to unit, rules.  Has been pleasant, cooperative, throughout admission process.  Requested and was given a meal, and was taken to her room.

## 2013-06-16 NOTE — ED Notes (Signed)
Per [facility], they do not evaluate for under [redacted] weeks gestation.
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
## 2013-06-16 NOTE — ED Notes (Signed)
Per pt, Wants detox from heroine and cocaine.  Last used this morning.  Pt states last detox admit was a year ago.  Pt states she has been using entire pregnancy.  Last was seen by OBGYN on Feb 5.  Pt states all was good at that time.

## 2013-06-16 NOTE — ED Provider Notes (Signed)
Complains of low back pain and low abdominal pain onset 2 days patient states she's slipped and fell 2 days ago. Then on her back. Pain is not made worse or better by anything is constant. Last bowel movement today, normal she is currently [redacted] weeks pregnant. Last used heroin 2 days ago. No other associated symptoms. On exam alert Glasgow Coma Score 15 tearful lungs clear with patient heart regular rate and rhythm abdomen gravid nontender back without point tenderness or flank tenderness neurologic Glasgow Coma Score 15 gait normal cranial nerves II through XII intact moves all extremities well  Doug SouSam Maelys Kinnick, MD 06/16/13 1610

## 2013-06-16 NOTE — ED Notes (Signed)
[facility] for pick up.

## 2013-06-16 NOTE — ED Provider Notes (Signed)
CSN: [id number]     Arrival date & time 06/16/13  1325 History   First MD Initiated Contact with Patient 06/16/13 1501     Chief Complaint  Patient presents with  . Medical Clearance     (Consider location/radiation/quality/duration/timing/severity/associated sxs/prior Treatment) HPI [name] is a 28 y.o. female who presents to emergency department with chief complaint of lower abdominal pain for 2 days as well as requesting detox from cocaine and heroin. Patient states that she is approximately [redacted] weeks pregnant, has had an ultrasound a month ago to confirm IUP. She has not followed up with OB/GYN. She states she was doing well up until 2 days ago when she developed lower abdominal pain and lower back pain. She denies any vaginal discharge or bleeding at this time. She denies any urinary symptoms. She denies any abnormal vaginal discharge. She has not taken anything for the pain. Patient admits to current use of cocaine and heroine. She states she last used this morning. Patient is very tight and states she wants to stop using.  Past Medical History  Diagnosis Date  . STD (female)     hx of chlamydia and gonorrhea  . History of PID   . Endometriosis   . Glaucoma   . Ovarian cyst   . Bartholin cyst   . Headache(784.0)   . Infection     UTI  . Vaginal Pap smear, abnormal     has not followed up   Past Surgical History  Procedure Laterality Date  . Fracture surgery      left leg  . Dilation and curettage of uterus    . Laparoscopy     Family History  Problem Relation Age of Onset  . Anesthesia problems Neg Hx   . Hypotension Neg Hx   . Malignant hyperthermia Neg Hx   . Pseudochol deficiency Neg Hx   . Alcohol abuse Neg Hx   . Diabetes Mother   . Hypertension Maternal Grandmother   . Heart disease Maternal Grandmother     great grandma   History  Substance Use Topics  . Smoking status: Current Every Day Smoker -- 0.25 packs/day for 7 years    Types: Cigarettes   . Smokeless tobacco: Never Used  . Alcohol Use: No     Comment: Denies ETOH use   OB History   Grav Para Term Preterm Abortions TAB SAB Ect Mult Living   4 1 0 1 2 0 2 0 0 1      Review of Systems  Constitutional: Negative for fever and chills.  Respiratory: Negative for cough, chest tightness and shortness of breath.   Cardiovascular: Negative for chest pain, palpitations and leg swelling.  Gastrointestinal: Positive for abdominal pain. Negative for nausea, vomiting and diarrhea.  Genitourinary: Positive for pelvic pain. Negative for dysuria, flank pain, vaginal bleeding, vaginal discharge and vaginal pain.  Musculoskeletal: Positive for back pain. Negative for arthralgias, myalgias, neck pain and neck stiffness.  Skin: Negative for rash.  Neurological: Negative for dizziness, weakness and headaches.  All other systems reviewed and are negative.      Allergies  Acyclovir and related; Darvocet; Doxycycline; Flexeril; Metoclopramide; Naproxen; Latex; and Tramadol  Home Medications   Current Outpatient Rx  Name  Route  Sig  Dispense  Refill  . folic acid (FOLVITE) 1 MG tablet   Oral   Take 1 tablet (1 mg total) by mouth daily.   30 tablet   3   . ibuprofen (ADVIL,MOTRIN) 200  MG tablet   Oral   Take 800 mg by mouth every 6 (six) hours as needed for mild pain or moderate pain.         . promethazine (PHENERGAN) 25 MG tablet   Oral   Take 1 tablet (25 mg total) by mouth every 6 (six) hours as needed for nausea or vomiting.   30 tablet   0    BP 135/84  Pulse 100  Temp(Src) 98.9 F (37.2 C) (Oral)  Resp 16  SpO2 99%  LMP 02/28/2013 Physical Exam  Nursing note and vitals reviewed. Constitutional: She is oriented to person, place, and time. She appears well-developed and well-nourished.  HENT:  Head: Normocephalic.  Eyes: Conjunctivae are normal.  Neck: Neck supple.  Cardiovascular: Normal rate, regular rhythm and normal heart sounds.   Pulmonary/Chest:  Effort normal and breath sounds normal. No respiratory distress. She has no wheezes. She has no rales.  Abdominal: Soft. Bowel sounds are normal. She exhibits no distension. There is no tenderness. There is no rebound and no guarding.  Musculoskeletal: She exhibits no edema.  Neurological: She is alert and oriented to person, place, and time.  Skin: Skin is warm and dry.  Psychiatric: Her behavior is normal.  Pt tearful, avoiding eye contact    ED Course  Procedures (including critical care time) Labs Review Labs Reviewed  CBC - Abnormal; Notable for the following:    WBC 12.1 (*)    All other components within normal limits  COMPREHENSIVE METABOLIC PANEL - Abnormal; Notable for the following:    Sodium 135 (*)    Potassium 3.6 (*)    Total Bilirubin 0.2 (*)    All other components within normal limits  SALICYLATE LEVEL - Abnormal; Notable for the following:    Salicylate Lvl <2.0 (*)    All other components within normal limits  URINE RAPID DRUG SCREEN (HOSP PERFORMED) - Abnormal; Notable for the following:    Cocaine POSITIVE (*)    All other components within normal limits  URINALYSIS, ROUTINE W REFLEX MICROSCOPIC - Abnormal; Notable for the following:    APPearance CLOUDY (*)    Hgb urine dipstick SMALL (*)    Leukocytes, UA SMALL (*)    All other components within normal limits  URINE CULTURE  GC/CHLAMYDIA PROBE AMP  WET PREP, GENITAL  ACETAMINOPHEN LEVEL  ETHANOL  URINE MICROSCOPIC-ADD ON   Imaging Review US Ob Limited  06/16/2013   CLINICAL DATA:  Fourteen weeks pregnant, abdominal pain  EXAM: LIMITED OBSTETRIC ULTRASOUND  COMPARISON:  Prior 05/03/2013  FINDINGS: Number of Fetuses: 1  Heart Rate:  147 bpm  Movement: Yes  Presentation: Breech  Placental Location: Anterior  Previa: None  Amniotic Fluid (Subjective):  Within normal limits.  BPD:  2.7 cm   14 w   6 d  MATERNAL FINDINGS:  Cervix:  Appears closed.  Uterus/Adnexae:  No abnormality visualized.  IMPRESSION:  Single live intrauterine pregnancy as above.  Feel size corresponds to assigned gestational age based on prior ultrasound.  This exam is performed on an emergent basis and does not comprehensively evaluate fetal size, dating, or anatomy; follow-up complete OB US should be considered if further fetal assessment is warranted.   Electronically Signed   By: Ulyses Southward M.D.   On: 06/16/2013 18:06    EKG Interpretation   None       MDM   Final diagnoses:  Polysubstance abuse  Abdominal pain in pregnancy   Patient approximately 14 weeks  4 days pregnant, estimated by last ultrasound that was a month ago. Patient is complaining of lower, pain and lower back pain for 2 days. Will check urine, labs, ultrasound  To further evaluate the pregnancy further. Patient denies any vaginal discharge or bleeding. Will get medical clearance labs. Tylenol for pain.  4:28 PM Pt refusing pelvic exam. US pending. Labs unremarkable. TTS consulted.   5:35 PM Pt accepted to [facility], Dr. Dub MikesLugo, once US back, if normal.   US normal. Pt will be transferred to [facility]       Lottie Musselatyana A Decarla Siemen, PA-C 06/16/13 2359

## 2013-06-16 NOTE — Discharge Instructions (Signed)
US normal. Prenatal vitamins daily. Tylenol for pain. Follow up with OB/GYN

## 2013-06-16 NOTE — Progress Notes (Signed)
Patient did not attend the evening speaker AA meeting. Pt was just newly admitted to the hall and remained in her room.

## 2013-06-16 NOTE — Progress Notes (Signed)
Spoke with Kathy Crumbleatyana Kirichenko, PA before and after assessment. Patient will have ultrasound to medically clear patient [facility]. Rosey BathKelly Belinda Schlichting, RN

## 2013-06-16 NOTE — ED Notes (Signed)
Bed: [id number] Expected date:  Expected time:  Means of arrival:  Comments: Nichter

## 2013-06-16 NOTE — ED Notes (Addendum)
Pt also states she is having abdominal and back pain.  Pt states pain started 2 days ago on its own.  However, additional staff is stating that patient admitted to falling on ice.

## 2013-06-17 DIAGNOSIS — F4310 Post-traumatic stress disorder, unspecified: Secondary | ICD-10-CM | POA: Diagnosis present

## 2013-06-17 DIAGNOSIS — F1920 Other psychoactive substance dependence, uncomplicated: Secondary | ICD-10-CM

## 2013-06-17 DIAGNOSIS — F329 Major depressive disorder, single episode, unspecified: Secondary | ICD-10-CM | POA: Diagnosis present

## 2013-06-17 DIAGNOSIS — F1120 Opioid dependence, uncomplicated: Secondary | ICD-10-CM | POA: Diagnosis present

## 2013-06-17 LAB — URINE CULTURE

## 2013-06-17 MED ORDER — DIPHENHYDRAMINE HCL 25 MG PO CAPS
25.0000 mg | ORAL_CAPSULE | Freq: Three times a day (TID) | ORAL | Status: DC | PRN
  Administered 2013-06-17 – 2013-06-25 (×15): 25 mg via ORAL
  Filled 2013-06-17 (×16): qty 1

## 2013-06-17 MED ORDER — BUPRENORPHINE HCL 2 MG SL SUBL
4.0000 mg | SUBLINGUAL_TABLET | Freq: Three times a day (TID) | SUBLINGUAL | Status: DC
  Administered 2013-06-17 – 2013-06-27 (×31): 4 mg via SUBLINGUAL
  Filled 2013-06-17 (×32): qty 2

## 2013-06-17 MED ORDER — TRAZODONE HCL 100 MG PO TABS
100.0000 mg | ORAL_TABLET | Freq: Every evening | ORAL | Status: DC | PRN
  Administered 2013-06-17: 100 mg via ORAL
  Filled 2013-06-17: qty 1

## 2013-06-17 MED ORDER — TRAZODONE HCL 50 MG PO TABS
50.0000 mg | ORAL_TABLET | Freq: Two times a day (BID) | ORAL | Status: DC | PRN
  Administered 2013-06-23 – 2013-06-25 (×2): 50 mg via ORAL
  Filled 2013-06-17 (×3): qty 1
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
## 2013-06-17 NOTE — Progress Notes (Signed)
D Pt. Denies SI and HI, she does complain of some itching and anxiety  A Writer offered support and encouragement.   R Pt. Remains safe on the unit.  WRiter offered  Benadryl to relieve the anxiety and itching.  Pt. Is calm and presently attending wrap up group.
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
## 2013-06-17 NOTE — [facility] Suicide Risk Assessment (Signed)
[facility] INPATIENT: Family/Significant Other Suicide Prevention Education  Suicide Prevention Education:  Education Completed; No one has been identified by the patient as the family member/significant other with whom the patient will be residing, and identified as the person(s) who will aid the patient in the event of a mental health crisis (suicidal ideations/suicide attempt).   Pt did not c/o SI at admission, nor have they endorsed SI during their stay here. SPE not required. SPI pamphlet provided to pt and she was encouraged to share information with support network, ask questions, and talk about any concerns relating to SPE.  The Sherwin-WilliamsHeather Smart, LCSWA 06/17/2013 11:35 AM

## 2013-06-17 NOTE — Progress Notes (Signed)
Patient ID: [name], female   DOB: 12/01/1985, 28 y.o.   MRN: [id number] She was in the dayroom after supper and started to have sharp abdominal pain. Stated that her stomach was tight and was hurting. When asked she said that she had eat a a lot of hot spicy sauce at supper. She was  Walked to her room.  She said"' after she went to bathroom that she did not have any bleeding. She layed back down and and belched loudly.  Said that the baby was kicking her. She was give ginger ale to drink .

## 2013-06-17 NOTE — [facility] Group Notes (Signed)
[facility]   06/17/2013 9:26 AM  Participation Quality:  DID NOT ATTEND-pt in bed sleeping.   Smart, American FinancialHeather LCSWA
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
## 2013-06-17 NOTE — Progress Notes (Signed)
06-17-13 NSG NOTE  7a-3p  D: Affect is anxious and depressed.  Mood is depressed.  Behavior is anxious but cooperative with encouragement, direction and support.  Interacts appropriately with peers and staff.  Participated in counselor lead group.   Reports fair sleep and good appetite, with normal energy levels and good ability to pay attention.  Identified changes she would have to make post discharge.  Concerned about medications, wants to speak to MD.  A:  Medications per MD order.  Support given throughout day.  1:1 time spent with pt.  R:  Following treatment plan.  Denies HI/SI, auditory or visual hallucinations.  Contracts for safety.

## 2013-06-17 NOTE — Clinical Social Work Note (Signed)
CSW contacted [facility] to check status of pt (waiting list). CSW spoke with Mellissa Kohutaroline Poole who will check status and call back. CSW [facility] with [facility] pt on [facility]oxine into program.  [facility] Nort[facility]ather Smart, LCSWA 06/17/2013 11:44 AM

## 2013-06-17 NOTE — Progress Notes (Signed)
Adult Psychoeducational Group Note  Date:  06/17/2013 Time: 10:00 am  Group Topic/Focus:  Wellness Toolbox:   The focus of this group is to discuss various aspects of wellness, balancing those aspects and exploring ways to increase the ability to experience wellness.  Patients will create a wellness toolbox for use upon discharge.   [name], [name] 06/17/2013, 3:41 PM

## 2013-06-17 NOTE — [facility] Group Notes (Signed)
[facility] LCSW Group Therapy  06/17/2013 3:02 PM  Type of Therapy:  Group Therapy  Participation Level:  Minimal  Participation Quality:  Inattentive  Affect:  Labile  Cognitive:  Disorganized  Insight:  Limited  Engagement in Therapy:  Limited  Modes of Intervention:  Confrontation, Discussion, Education, Exploration, Problem-solving, Rapport Building, Socialization and Support  Summary of Progress/Problems: Today's Topic: Overcoming Obstacles. Pt identified obstacles faced currently and processed barriers involved in overcoming these obstacles. Pt identified steps necessary for overcoming these obstacles and explored motivation (internal and external) for facing these difficulties head on. Pt further identified one area of concern in their lives and chose a skill of focus pulled from their "toolbox." [name] was inattentive during group. She was asked to take a seat after pacing room for a few minutes after she entered. Lashannon fell asleep and did not participate in group discussion. CSW woke pt up when group concluded to tell her that [facility] would accept her on suboxine and pt stated that she was happy to hear this news. At this time, pt making limited progress in group setting due to inattention, drowsiness, and inability to focus on group topic.    Smart, Tiandra Swoveland LCSWA  06/17/2013, 3:02 PM

## 2013-06-17 NOTE — Progress Notes (Signed)
Patient ID: Kathy ParsonsSarah D Dengel, female   DOB: 04/16/1986, 28 y.o.   MRN: [id number]  At this time she is up combing her hair. Said that she had belched some more and did not have the abdominal pain now.

## 2013-06-18 DIAGNOSIS — F411 Generalized anxiety disorder: Secondary | ICD-10-CM

## 2013-06-18 MED ORDER — PRENATAL MULTIVITAMIN CH: 1.0000 | ORAL_TABLET | Freq: Every day | ORAL | Status: DC

## 2013-06-18 MED ORDER — TRAZODONE HCL 150 MG PO TABS
150.0000 mg | ORAL_TABLET | Freq: Every evening | ORAL | Status: DC | PRN
  Administered 2013-06-18 – 2013-06-26 (×9): 150 mg via ORAL
  Filled 2013-06-18 (×10): qty 1

## 2013-06-18 MED ORDER — PRENATAL MULTIVITAMIN CH
1.0000 | ORAL_TABLET | Freq: Every day | ORAL | Status: DC
  Administered 2013-06-19 – 2013-06-27 (×9): 1 via ORAL
  Filled 2013-06-18 (×3): qty 1
  Filled 2013-06-18: qty 14
  Filled 2013-06-18 (×8): qty 1

## 2013-06-18 MED ORDER — FOLIC ACID 1 MG PO TABS
1.0000 mg | ORAL_TABLET | Freq: Every day | ORAL | Status: DC
  Administered 2013-06-18 – 2013-06-27 (×10): 1 mg via ORAL
  Filled 2013-06-18 (×5): qty 1
  Filled 2013-06-18: qty 14
  Filled 2013-06-18 (×9): qty 1

## 2013-06-18 NOTE — Clinical Social Work Note (Signed)
CSW met with pt individually this morning to discuss aftercare plan. Pt stated that she plans to take suboxine throughout her pregnancy to control cravings. Pt is therefore ineligible for both [facility] and [facility]. She is on waiting list at [facility] in [place]. Pt stated that she can return home for a short time if needed.   [place], LCSWA 06/18/2013 10:55 AM

## 2013-06-18 NOTE — Progress Notes (Signed)
Recreation Therapy Notes  Date: 02.23.2015 Time: 2:45pm Location: [address] Dayroom   Group Topic: Wellness  Goal Area(s) Addresses:  Patient will define components of whole wellness. Patient will verbalize benefit of whole wellness.  Behavioral Response: Did not attend.   Marykay Lexenise L Adreona Brand, LRT/CTRS  Kymari Lollis L 06/18/2013 9:25 AM

## 2013-06-18 NOTE — Progress Notes (Signed)
The focus of this group is to educate the patient on the purpose and policies of crisis stabilization and provide a format to answer questions about their admission.  The group details unit policies and expectations of patients while admitted. Patient reported in group that " control my attitude"; patient reports " I want to take care of my energy
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
## 2013-06-18 NOTE — Progress Notes (Signed)
[facility] MD Progress Note  06/18/2013 4:31 PM [name]  MRN:  [id number] Subjective:  Still not able to sleep. States that she is committed to make this work for her and her baby. She has applied to a long term program in [place]. She is waiting for their response. The Subutex is controlling the withdrawal symptoms. If she is not allowed on the program on Subutex, will start weaning off. If she is allowed on it she would prefer to be on the Subutex.  Diagnosis:   DSM5: Schizophrenia Disorders:  none Obsessive-Compulsive Disorders:  none Trauma-Stressor Disorders:  Posttraumatic Stress Disorder (309.81) Substance/Addictive Disorders:  Cannabis Use Disorder - Moderate 9304.30) and Opioid Disorder - Severe (304.00) Depressive Disorders:  Major Depressive Disorder - Moderate (296.22) Total Time spent with patient: 30 minutes  Axis I: Anxiety Disorder NOS  ADL's:  Intact  Sleep: Poor  Appetite:  Fair  Suicidal Ideation:  Plan:  denies Intent:  denies Means:  denies Homicidal Ideation:  Plan:  denies Intent:  denies Means:  denies AEB (as evidenced by):  Psychiatric Specialty Exam: Physical Exam  Review of Systems  Constitutional: Positive for malaise/fatigue.  HENT: Negative.   Eyes: Negative.   Respiratory: Negative.   Cardiovascular: Negative.   Gastrointestinal: Negative.   Genitourinary: Negative.   Musculoskeletal: Negative.   Skin: Negative.   Neurological: Positive for weakness.  Endo/Heme/Allergies: Negative.   Psychiatric/Behavioral: Positive for substance abuse. The patient is nervous/anxious and has insomnia.     Blood pressure 121/67, pulse 68, temperature 97.6 F (36.4 C), temperature source Oral, resp. rate 16, height 5' 3.5" (1.613 m), weight 68.947 kg (152 lb), last menstrual period 02/28/2013.Body mass index is 26.5 kg/(m^2).  General Appearance: Fairly Groomed  Patent attorney::  Fair  Speech:  Clear and Coherent  Volume:  fluctuates  Mood:   Anxious, Depressed and worried  Affect:  anxious, depressed, worried  Thought Process:  Coherent and Goal Directed  Orientation:  Full (Time, Place, and Person)  Thought Content:  symtpoms, worries, concerns  Suicidal Thoughts:  No  Homicidal Thoughts:  No  Memory:  Immediate;   Fair Recent;   Fair Remote;   Fair  Judgement:  Fair  Insight:  Present  Psychomotor Activity:  Restlessness  Concentration:  Fair  Recall:  [employer] of Knowledge:NA  Language: Fair  Akathisia:  No  Handed:    AIMS (if indicated):     Assets:  Desire for Improvement  Sleep:  Number of Hours: 2.75   Musculoskeletal: Strength & Muscle Tone: within normal limits Gait & Station: normal Patient leans: N/A  Current Medications: Current Facility-Administered Medications  Medication Dose Route Frequency Provider Last Rate Last Dose  . buprenorphine (SUBUTEX) SL tablet 4 mg  4 mg Sublingual TID AC Rachael Fee, MD   4 mg at 06/18/13 1158  . diphenhydrAMINE (BENADRYL) capsule 25 mg  25 mg Oral Q8H PRN Rachael Fee, MD   25 mg at 06/17/13 1959  . folic acid (FOLVITE) tablet 1 mg  1 mg Oral Daily Rachael Fee, MD      . ondansetron (ZOFRAN-ODT) disintegrating tablet 4 mg  4 mg Oral Q6H PRN Kristeen Mans, NP      . Melene Muller ON 06/19/2013] prenatal multivitamin tablet 1 tablet  1 tablet Oral Q1200 Rachael Fee, MD      . traZODone (DESYREL) tablet 150 mg  150 mg Oral QHS PRN Rachael Fee, MD      .  traZODone (DESYREL) tablet 50 mg  50 mg Oral BID PRN Rachael FeeIrving A Mimie Goering, MD        Lab Results: No results found for this or any previous visit (from the past 48 hour(s)).  Physical Findings: AIMS: Facial and Oral Movements Muscles of Facial Expression: None, normal Lips and Perioral Area: None, normal Jaw: None, normal Tongue: None, normal,Extremity Movements Upper (arms, wrists, hands, fingers): None, normal Lower (legs, knees, ankles, toes): None, normal, Trunk Movements Neck, shoulders, hips: None, normal,  Overall Severity Severity of abnormal movements (highest score from questions above): None, normal Incapacitation due to abnormal movements: None, normal Patient's awareness of abnormal movements (rate only patient's report): No Awareness, Dental Status Current problems with teeth and/or dentures?: No Does patient usually wear dentures?: No  CIWA:  CIWA-Ar Total: 0 COWS:  COWS Total Score: 6  Treatment Plan Summary: Daily contact with patient to assess and evaluate symptoms and progress in treatment Medication management  Plan: Supportive approach/coping skills/relape prevention           Will increase the Trazodone to 150 mg HS           Will add the Prenatal and the Folic Acid           Facilitate admission to rehab program  Medical Decision Making Problem Points:  Review of psycho-social stressors (1) Data Points:  Review of medication regiment & side effects (2)  I certify that inpatient services furnished can reasonably be expected to improve the patient's condition.   Avishai Reihl A 06/18/2013, 4:31 PM

## 2013-06-18 NOTE — [facility] Group Notes (Signed)
[facility] LCSW Group Therapy  06/18/2013 1:56 PM  Type of Therapy:  Group Therapy  Participation Level:  Did Not Attend-pt in room resting/refused to attend afternoon group.  Smart, [name] LCSWA 06/18/2013, 1:56 PM

## 2013-06-18 NOTE — Progress Notes (Signed)
Pt resting in bed with eyes closed. No distress noted. No c/o Voiced.

## 2013-06-18 NOTE — Clinical Social Work Note (Signed)
Kathy ReamerSarah Yates [facility] called to schedule phone interview with pt for 9am Wed (06/19/13).   [address][name] Smart, LCSWA 06/18/2013 2:34 PM

## 2013-06-19 DIAGNOSIS — F1994 Other psychoactive substance use, unspecified with psychoactive substance-induced mood disorder: Secondary | ICD-10-CM

## 2013-06-19 MED ORDER — POLYETHYLENE GLYCOL 3350 17 G PO PACK
17.0000 g | PACK | Freq: Two times a day (BID) | ORAL | Status: DC
  Administered 2013-06-19 – 2013-06-25 (×3): 17 g via ORAL
  Filled 2013-06-19 (×12): qty 1
  Filled 2013-06-19: qty 28
  Filled 2013-06-19: qty 1
  Filled 2013-06-19: qty 28
  Filled 2013-06-19 (×7): qty 1

## 2013-06-19 MED ORDER — POLYETHYLENE GLYCOL 3350 17 G PO PACK
17.0000 g | PACK | Freq: Once | ORAL | Status: AC
  Administered 2013-06-19: 17 g via ORAL
  Filled 2013-06-19: qty 1

## 2013-06-19 MED ORDER — BISACODYL 5 MG PO TBEC: 10.0000 mg | DELAYED_RELEASE_TABLET | Freq: Once | ORAL | Status: DC

## 2013-06-19 MED ORDER — ACETAMINOPHEN 325 MG PO TABS
650.0000 mg | ORAL_TABLET | Freq: Four times a day (QID) | ORAL | Status: DC | PRN
  Administered 2013-06-23 – 2013-06-26 (×4): 650 mg via ORAL
  Filled 2013-06-19 (×4): qty 2

## 2013-06-19 NOTE — ED Provider Notes (Signed)
Medical screening examination/treatment/procedure(s) were conducted as a shared visit with non-physician practitioner(s) and myself.  I personally evaluated the patient during the encounter.  EKG Interpretation   None        Doug SouSam Owens Hara, MD 06/19/13 1114

## 2013-06-19 NOTE — Progress Notes (Signed)
Adult Psychoeducational Group Note  Date:  06/18/2013 Time: 8:00 PM  Group Topic/Focus:  AA  Participation Level:  Active  Participation Quality:  Intrusive, Inattentive and Monopolizing  Affect:  Anxious and Excited  Cognitive:  Lacking  Insight: Lacking  Engagement in Group:  Distracting and Monopolizing  Modes of Intervention:  Discussion, Education and Limit-setting  Additional Comments:  Pt was very inappropriate in group, beginning with cleaning up in the dayroom while the facilitators were speaking. Pt also kept interrupting the facilitators to ask questions. She also held side conversations while other patients were talking. Pt was loud and had to be redirected several times by this Clinical research associatewriter.   Humberto SealsWhitaker, Rayshaun Needle [name] 06/19/2013, 2:41 AM

## 2013-06-19 NOTE — Progress Notes (Signed)
Mental health technician Jobe Igoanya G and Lamont reported that during recreation that the patient was grazed with the basketball; nursing assessment done; patient is alert and oriented x 4; patient is not complaining of any distress at this time; Clinical research associatewriter made NP [place] aware of patient status and order for Tylenol was given; patient had no other concerns at this time

## 2013-06-19 NOTE — Progress Notes (Addendum)
Recreation Therapy Notes  Date: 02.24.2015 Time: 2:45pm Location: [address] Dayroom   Group Topic: Communication, Team Building, Problem Solving  Goal Area(s) Addresses:  Patient will effectively work with peer towards shared goal.  Patient will identify skill used to make activity successful.  Patient will identify how skills used during activity can be used to reach post d/c goals.   Behavioral Response: Monopolizing, Disruptive  Intervention: Problem Solving Activitiy  Activity: Life Boat. Patients were given a scenario about being on a sinking yacht. Patients were informed the yacht included 15 guest, 8 of which could be placed on the life boat, along with all group members. Individuals on guest list were of varying socioeconomic classes such as a Education officer, museumriest, Materials engineerresident Obama, MidwifeBus Driver, Tree surgeonTeacher and Chef.   Education: Pharmacist, communityocial Skills, Discharge Planning   Education Outcome: Acknowledges understanding  Clinical Observations/Feedback: Patient attended group session, but was monopolizing and disruptive during activity. Patient stood at front of room and attempted to direct activity, patient additionally made inappropriate statements, for example the physician should be placed on the life raft because he can administer drugs. LRT redirected patient, however patient continued with inappropriate statements and monopolizing behaviors. At one point patient lost interest in monopolizing group session, but proceeded to hold a side conversation with peer. LRT redirected patient, however patient ignored LRT and continued side conversation.   Marykay Lexenise L Rheagan Nayak, LRT/CTRS  Heleena Miceli L 06/19/2013 1:04 PM

## 2013-06-19 NOTE — Progress Notes (Signed)
D: Patient denies SI/HI and A/V hallucinations; patient reports sleep is well; reports appetite is good; reports energy level is normal ; reports ability to pay attention is good; rates depression as 1/10; rates hopelessness 1/10; complaints of constipation, agitation, cravings, and chills; physician is aware  A: Monitored q 15 minutes; patient encouraged to attend groups; patient educated about medications; patient given medications per physician orders; patient encouraged to express feelings and/or concerns  R: Patient is childlike and can be intrusive; patient can be needy; patient is cooperative; ; patient's interaction with staff and peers is appropriate; patient was able to set goal to talk with staff 1:1 when having feelings of SI; patient is taking medications as prescribed and tolerating medications; patient is attending all groups

## 2013-06-19 NOTE — Progress Notes (Signed)
Recreation Therapy Notes  Date: 02.25.2015  Time: 2:45pm Location: [address] Dayroom   Group Topic: Understanding mental health   Goal Area(s) Addresses:  Patient will identify benefits to understanding mental health. Patient will work effectively with teammates.   Behavioral Response: Sleeping  Intervention: Game  Activity: Mental Health Jeopardy. Patients were divided into groups of 4 -5 patients, working as a team patients were asked to select a category of questions and a point value. Categories included: Symptoms, Medications, Causes, Coping Skills, and Substance Abuse.   Education: Wellness, Discharge Planning   Education Outcome: Acknowledges understanding  Clinical Observations/Feedback: Patient entered group session with angry affect, stomping her feet and mumbling under her breath. Patient sat at front of room for approximately 45 seconds before becoming agitated, seemingly by her own thoughts, as no one was interacting with patient and patient stomped out of room. Patient at approximately 3:15pm with same demeanor and affect. Patient self corrected shortly after joining group session and LRT asking her to join an already established team. As with previous recreation therapy session patient monopolized members of her team, patient tolerated LRT direction to include her teammates on her selections. After selecting and answering one question patient was observed to lean her head on a female teammate and go to sleep. Patient was snoring loudly. LRT approached patient to wake her and suggest that she go to her room. Patient refused LRT suggestion.  Patient continued to dose in and out of sleep, finally standing, but doing so unsteadily. LRT approached patient to assist her with leaving room and patient dismissed LRT. At this time an RN walking by the dayroom saw patient and assisted patient with getting back to her room.   Marykay Lexenise L Resean Brander, LRT/CTRS  Jearl KlinefelterBlanchfield, Cyrah Mclamb L 06/19/2013  4:44 PM

## 2013-06-19 NOTE — [facility] Group Notes (Signed)
[facility] LCSW Group Therapy  06/19/2013 2:24 PM  Type of Therapy:  Group Therapy  Participation Level:  Minimal  Participation Quality:  Intrusive  Affect:  Excited  Cognitive:  Lacking  Insight:  Limited  Engagement in Therapy:  Limited  Modes of Intervention:  Confrontation, Discussion, Education, Exploration, Problem-solving, Rapport Building, Socialization and Support  Summary of Progress/Problems: Emotion Regulation: This group focused on both positive and negative emotion identification and allowed group members to process ways to identify feelings, regulate negative emotions, and find healthy ways to manage internal/external emotions. Group members were asked to reflect on a time when their reaction to an emotion led to a negative outcome and explored how alternative responses using emotion regulation would have benefited them. Group members were also asked to discuss a time when emotion regulation was utilized when a negative emotion was experienced. [name] remained in group for approximately five minutes, asking CSW about whether she can stay until Wed. CSW asked pt to speak with MD about her stay. Pt stated that she was tired and wanted to take a nap. Pt left room and did not return. At this time, [name] demonstrates no progress in the group setting with no insight. [name] has difficulty contributing to group discussion.    Smart, Thurl Boen LCSWA  06/19/2013, 2:24 PM

## 2013-06-19 NOTE — Progress Notes (Signed)
[facility] MD Progress Note  06/19/2013 9:26 PM [name]  MRN:  [id number] Subjective:  Withdrawal symptoms are decreasing. She states she slept better last night. Still no clear response from the program she wants to go to in terms of when can they get her. She admits to worry, anxiety, having a hard time dealing with the uncertainty. Diagnosis:   DSM5: Schizophrenia Disorders:  none Obsessive-Compulsive Disorders:  none Trauma-Stressor Disorders:  Posttraumatic Stress Disorder (309.81) Substance/Addictive Disorders:  Opioid Disorder - Severe (304.00) Depressive Disorders:  Major Depressive Disorder - Moderate (296.22) Total Time spent with patient: 30 minutes  Axis I: Substance Induced Mood Disorder  ADL's:  Intact  Sleep: Fair  Appetite:  Fair  Suicidal Ideation:  Plan:  denies Intent:  denies Means:  denies Homicidal Ideation:  Plan:  denies Intent:  denies Means:  denies AEB (as evidenced by):  Psychiatric Specialty Exam: Physical Exam  Review of Systems  Constitutional: Positive for malaise/fatigue.  HENT: Negative.   Eyes: Negative.   Respiratory: Negative.   Cardiovascular: Negative.   Gastrointestinal: Positive for constipation.  Musculoskeletal: Positive for back pain.  Skin: Negative.   Neurological: Negative.   Endo/Heme/Allergies: Negative.   Psychiatric/Behavioral: Positive for depression and substance abuse. The patient is nervous/anxious.     Blood pressure 114/77, pulse 77, temperature 97.7 F (36.5 C), temperature source Oral, resp. rate 20, height 5' 3.5" (1.613 m), weight 68.947 kg (152 lb), last menstrual period 02/28/2013.Body mass index is 26.5 kg/(m^2).  General Appearance: Fairly Groomed  Patent attorney::  Fair  Speech:  Clear and Coherent  Volume:  fluctuates  Mood:  Anxious and worried  Affect:  anxious, worried  Thought Process:  Coherent and Goal Directed  Orientation:  Full (Time, Place, and Person)  Thought Content:  symtpoms,  worries, concerns  Suicidal Thoughts:  No  Homicidal Thoughts:  No  Memory:  Immediate;   Fair Recent;   Fair Remote;   Fair  Judgement:  Fair  Insight:  Present and Shallow  Psychomotor Activity:  Restlessness  Concentration:  Fair  Recall:  [employer] of Knowledge:Fair  Language: Fair  Akathisia:  No  Handed:    AIMS (if indicated):     Assets:  Desire for Improvement  Sleep:  Number of Hours: 4.5   Musculoskeletal: Strength & Muscle Tone: within normal limits Gait & Station: normal Patient leans: N/A  Current Medications: Current Facility-Administered Medications  Medication Dose Route Frequency Provider Last Rate Last Dose  . acetaminophen (TYLENOL) tablet 650 mg  650 mg Oral Q6H PRN Beau Fanny, FNP      . buprenorphine (SUBUTEX) SL tablet 4 mg  4 mg Sublingual TID AC Rachael Fee, MD   4 mg at 06/19/13 1721  . diphenhydrAMINE (BENADRYL) capsule 25 mg  25 mg Oral Q8H PRN Rachael Fee, MD   25 mg at 06/19/13 1722  . folic acid (FOLVITE) tablet 1 mg  1 mg Oral Daily Rachael Fee, MD   1 mg at 06/19/13 4098  . ondansetron (ZOFRAN-ODT) disintegrating tablet 4 mg  4 mg Oral Q6H PRN Kristeen Mans, NP      . polyethylene glycol (MIRALAX / GLYCOLAX) packet 17 g  17 g Oral BID Sanjuana Kava, NP   17 g at 06/19/13 1833  . polyethylene glycol (MIRALAX / GLYCOLAX) packet 17 g  17 g Oral Once Rachael Fee, MD      . prenatal multivitamin tablet 1 tablet  1 tablet Oral Q1200 Rachael FeeIrving A Corretta Munce, MD   1 tablet at 06/19/13 1215  . traZODone (DESYREL) tablet 150 mg  150 mg Oral QHS PRN Rachael FeeIrving A Ranette Luckadoo, MD   150 mg at 06/18/13 2154  . traZODone (DESYREL) tablet 50 mg  50 mg Oral BID PRN Rachael FeeIrving A Lanell Carpenter, MD        Lab Results: No results found for this or any previous visit (from the past 48 hour(s)).  Physical Findings: AIMS: Facial and Oral Movements Muscles of Facial Expression: None, normal Lips and Perioral Area: None, normal Jaw: None, normal Tongue: None, normal,Extremity  Movements Upper (arms, wrists, hands, fingers): None, normal Lower (legs, knees, ankles, toes): None, normal, Trunk Movements Neck, shoulders, hips: None, normal, Overall Severity Severity of abnormal movements (highest score from questions above): None, normal Incapacitation due to abnormal movements: None, normal Patient's awareness of abnormal movements (rate only patient's report): No Awareness, Dental Status Current problems with teeth and/or dentures?: No Does patient usually wear dentures?: No  CIWA:  CIWA-Ar Total: 0 COWS:  COWS Total Score: 6  Treatment Plan Summary: Daily contact with patient to assess and evaluate symptoms and progress in treatment Medication management  Plan: Supportive approach/coping skills/relapse prevention           Continue Subutex 4 mg TID           Address the constipation             Medical Decision Making Problem Points:  Review of psycho-social stressors (1) Data Points:  Review of medication regiment & side effects (2)  I certify that inpatient services furnished can reasonably be expected to improve the patient's condition.   Nicloe Frontera A 06/19/2013, 9:26 PM
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
## 2013-06-19 NOTE — [facility] Group Notes (Signed)
[facility]   06/19/2013 9:05 AM  Participation Quality:  Appropriate   Mood/Affect:  Appropriate  Depression Rating:  6  Anxiety Rating:  6  Thoughts of Suicide:  No Will you contract for safety?   NA  Current AVH:  No  Plan for Discharge/Comments:  Pt reports sleeping well last night. Moderate withdrawal symptoms. Pt has phone interview with Maren ReamerSarah Yates from [facility] program at 9am to discuss referral.   Transportation Means: unknown at this time.   Supports: boyfriend/limited family supports   Counselling psychologistmart, OncologistHeather LCSWA

## 2013-06-20 MED ORDER — GLYCERIN (LAXATIVE) 2.1 G RE SUPP
1.0000 | Freq: Every day | RECTAL | Status: DC | PRN
  Administered 2013-06-20: 1 via RECTAL
  Filled 2013-06-20: qty 1

## 2013-06-20 MED ORDER — DOCUSATE SODIUM 100 MG PO CAPS
100.0000 mg | ORAL_CAPSULE | Freq: Two times a day (BID) | ORAL | Status: DC
  Administered 2013-06-22 – 2013-06-27 (×8): 100 mg via ORAL
  Filled 2013-06-20 (×5): qty 1
  Filled 2013-06-20: qty 28
  Filled 2013-06-20 (×4): qty 1
  Filled 2013-06-20: qty 28
  Filled 2013-06-20 (×9): qty 1

## 2013-06-20 MED ORDER — LORATADINE 10 MG PO TABS
10.0000 mg | ORAL_TABLET | Freq: Every day | ORAL | Status: DC
  Administered 2013-06-20 – 2013-06-27 (×8): 10 mg via ORAL
  Filled 2013-06-20 (×10): qty 1
  Filled 2013-06-20: qty 14
  Filled 2013-06-20 (×2): qty 1

## 2013-06-20 MED ORDER — HYDROCORTISONE 0.5 % EX CREA
TOPICAL_CREAM | Freq: Two times a day (BID) | CUTANEOUS | Status: DC
  Administered 2013-06-20 – 2013-06-22 (×4): via TOPICAL
  Administered 2013-06-23: 1 via TOPICAL
  Filled 2013-06-20: qty 28.35

## 2013-06-20 MED ORDER — ENSURE COMPLETE PO LIQD
237.0000 mL | Freq: Two times a day (BID) | ORAL | Status: DC
  Administered 2013-06-20 – 2013-06-25 (×5): 237 mL via ORAL
  Filled 2013-06-20 (×2): qty 237

## 2013-06-20 NOTE — [facility] Group Notes (Signed)
[facility] LCSW Group Therapy  06/20/2013 2:33 PM  Type of Therapy:  Group Therapy  Participation Level:  Did Not Attend-pt in room resting/refused to attend group.  Smart, [name] LCSWA  06/20/2013, 2:33 PM

## 2013-06-20 NOTE — Progress Notes (Signed)
Recreation Therapy Notes  Date: 02.26.2015 Time: 2:45pm Location: [address] Dayroom   Group Topic: Leisure Education  Goal Area(s) Addresses:  Patient will identify positive leisure activities.  Patient will identify positive emotions associated with leisure participation.  Patient will identify one positive benefit of participation in leisure activities.   Behavioral Response: Did not attend.   Marykay Lexenise L Lynell Kussman, LRT/CTRS  Tauren Delbuono L 06/20/2013 4:00 PM

## 2013-06-20 NOTE — Progress Notes (Signed)
Patient ID: [name], female   DOB: 1985-11-14, 28 y.o.   MRN: [id number]  D: Pt informed the writer that her Dr stated "she could stay as long as she needs to". Stated that [facility] and was informed that a bed wouldn't be available until Wed. "I don't want to go home and stay for 2 days waiting to go somewhere. I want to leave straight from here and go there". States she's excited about the program because she can take her 310 yr old daughter with her.   A:  Support and encouragement was offered. 15 min checks continued for safety.  R: Pt remains safe.

## 2013-06-20 NOTE — Progress Notes (Addendum)
Pt appears very groggy this am. Instructed to drink plenty of water . Pt c/o she has not had a BM in 5 days. NP made aware. Pt was given her scheduled miralax and then went back to bed. She does not appear to have any shakes this am and denies any nausea. Pt rates her depression and hopelessness a 1/10.She does c./o cravings and feeling agitated. Pt does deny Si and HI and contracts for safety. Pt was very unsteady on her feet and groogy . She stated she thinks some of her medications are making her feel this way. NP evaluated pt and pt was walked back to her bed with two person assist. Both siderails are up and pt remains in bed.-Pt had a glycerin suppository and did have a bowel movement-12noon- Pr appears much more stable on her feet and less groggy. She states her appetite is really good.

## 2013-06-20 NOTE — Progress Notes (Signed)
[facility] Group Notes:  (Nursing/MHT/Case Management/Adjunct)  Date:  06/20/2013  Time:  2100  Type of Therapy:  wrap up group  Participation Level:  Active  Participation Quality:  Intrusive and Sharing  Affect:  Labile  Cognitive:  Appropriate  Insight:  Lacking  Engagement in Group:  Distracting  Modes of Intervention:  Clarification, Education and Support  Summary of Progress/Problems:  [name], [name] 06/20/2013, 11:06 PM

## 2013-06-20 NOTE — Progress Notes (Signed)
[facility]  06/20/2013 4:31 PM Mancel ParsonsSarah D Wheelwright  MRN:  [id number] Subjective:  Huntley DecSara continues to work on trying to get her life back together. She is dealing with the traumatic events in her past and seeing how they can be affecting his behavior today. She is committed to do everything she can for this baby and make it up to her daughter and not repeat the same mistakes the her mother committed.  Diagnosis:   DSM5: Schizophrenia Disorders:  none Obsessive-Compulsive Disorders:  none Trauma-Stressor Disorders:  Posttraumatic Stress Disorder (309.81) Substance/Addictive Disorders:  Opioid Disorder - Severe (304.00) Depressive Disorders:  Major Depressive Disorder - Moderate (296.22) Total Time spent with patient: 30 minutes  Axis I: Substance Induced Mood Disorder  ADL's:  Intact  Sleep: Fair  Appetite:  Fair  Suicidal Ideation:  Plan:  denies Intent:  denies Means:  denies Homicidal Ideation:  Plan:  denies Intent:  denies Means:  denies AEB (as evidenced by):  Psychiatric Specialty Exam: Physical Exam  Review of Systems  Constitutional: Negative.   HENT: Negative.   Eyes: Negative.   Respiratory: Negative.   Cardiovascular: Negative.   Gastrointestinal: Positive for constipation.  Genitourinary: Negative.   Musculoskeletal: Negative.   Skin: Negative.   Endo/Heme/Allergies: Negative.   Psychiatric/Behavioral: Positive for substance abuse. The patient is nervous/anxious.     Blood pressure 102/69, pulse 87, temperature 97.4 F (36.3 C), temperature source Oral, resp. rate 20, height 5' 3.5" (1.613 m), weight 68.947 kg (152 lb), last menstrual period 02/28/2013.Body mass index is 26.5 kg/(m^2).  General Appearance: Fairly Groomed  Patent attorneyye Contact::  Fair  Speech:  Clear and Coherent  Volume:  fluctuates  Mood:  Anxious and worried  Affect:  anxious, worried  Thought Process:  Coherent and Goal Directed  Orientation:  Full (Time, Place, and Person)  Thought  Content:  symtpoms, worries, concerns  Suicidal Thoughts:  No  Homicidal Thoughts:  No  Memory:  Immediate;   Fair Recent;   Fair Remote;   Fair  Judgement:  Fair  Insight:  Present and Shallow  Psychomotor Activity:  Restlessness  Concentration:  Fair  Recall:  FiservFair  Fund of Knowledge:NA  Language: Fair  Akathisia:  No  Handed:    AIMS (if indicated):     Assets:  Desire for Improvement  Sleep:  Number of Hours: 6.5   Musculoskeletal: Strength & Muscle Tone: within normal limits Gait & Station: normal Patient leans: N/A  Current Medications: Current Facility-Administered Medications  Medication Dose Route Frequency Provider Last Rate Last Dose  . acetaminophen (TYLENOL) tablet 650 mg  650 mg Oral Q6H PRN Beau FannyJohn C Withrow, FNP      . buprenorphine (SUBUTEX) SL tablet 4 mg  4 mg Sublingual TID AC Rachael FeeIrving A Xiara Knisley, MD   4 mg at 06/20/13 1155  . diphenhydrAMINE (BENADRYL) capsule 25 mg  25 mg Oral Q8H PRN Rachael FeeIrving A Laiba Fuerte, MD   25 mg at 06/20/13 0846  . docusate sodium (COLACE) capsule 100 mg  100 mg Oral BID Rachael FeeIrving A Christian Treadway, MD      . feeding supplement (ENSURE COMPLETE) (ENSURE COMPLETE) liquid 237 mL  237 mL Oral BID BM Lavena BullionHeather W Baron, RD   237 mL at 06/20/13 1545  . folic acid (FOLVITE) tablet 1 mg  1 mg Oral Daily Rachael FeeIrving A Breigh Annett, MD   1 mg at 06/20/13 0844  . Glycerin (Adult) 2.1 G suppository 1 suppository  1 suppository Rectal Daily PRN Rachael FeeIrving A Lailah Marcelli,  MD   1 suppository at 06/20/13 1158  . hydrocortisone cream 0.5 %   Topical BID Sanjuana Kava, NP      . loratadine (CLARITIN) tablet 10 mg  10 mg Oral Daily Sanjuana Kava, NP   10 mg at 06/20/13 1545  . ondansetron (ZOFRAN-ODT) disintegrating tablet 4 mg  4 mg Oral Q6H PRN Kristeen Mans, NP      . polyethylene glycol (MIRALAX / GLYCOLAX) packet 17 g  17 g Oral BID Sanjuana Kava, NP   17 g at 06/20/13 0844  . prenatal multivitamin tablet 1 tablet  1 tablet Oral Q1200 Rachael Fee, MD   1 tablet at 06/20/13 1155  . traZODone (DESYREL)  tablet 150 mg  150 mg Oral QHS PRN Rachael Fee, MD   150 mg at 06/19/13 2226  . traZODone (DESYREL) tablet 50 mg  50 mg Oral BID PRN Rachael Fee, MD        Lab Results: No results found for this or any previous visit (from the past 48 hour(s)).  Physical Findings: AIMS: Facial and Oral Movements Muscles of Facial Expression: None, normal Lips and Perioral Area: None, normal Jaw: None, normal Tongue: None, normal,Extremity Movements Upper (arms, wrists, hands, fingers): None, normal Lower (legs, knees, ankles, toes): None, normal, Trunk Movements Neck, shoulders, hips: None, normal, Overall Severity Severity of abnormal movements (highest score from questions above): None, normal Incapacitation due to abnormal movements: None, normal Patient's awareness of abnormal movements (rate only patient's report): No Awareness, Dental Status Current problems with teeth and/or dentures?: No Does patient usually wear dentures?: No  CIWA:  CIWA-Ar Total: 4 COWS:  COWS Total Score: 6  Treatment Plan Summary: Daily contact with patient to assess and evaluate symptoms and progress in treatment Medication management  Plan: Supportive approach/coping skills/relapse prevention           Continue  Subutex 8 mg BID           Discuss her constipation with the pharmacist: (they recommended Glycerin suppositories, Colace)  Medical Decision Making Problem Points:  Established problem, worsening (2) and Review of psycho-social stressors (1) Data Points:  Review of medication regiment & side effects (2) Review of new medications or change in dosage (2)  I certify that inpatient services furnished can reasonably be expected to improve the patient's condition.   Oseas Detty A 06/20/2013, 4:31 PM

## 2013-06-20 NOTE — Progress Notes (Signed)
D: pt has been loud and irritable all evening, yelling and getting into it with other pt and staff. Writer tried talking to pt, but pt not very receptive. Denies si/hi/avh. Denies pain.  A: q 15 min safety checks, extra snack given.  R: pt remains safe on unit.

## 2013-06-20 NOTE — Progress Notes (Signed)
NUTRITION ASSESSMENT  Pt identified via consult for nutrition assessment.   INTERVENTION: 1. Educated patient on the importance of nutrition and encouraged intake of food and beverages. 2. Discussed diet therapy for pregnancy and provided handouts of this information. Encouraged pt to consume 100% of meals and snacks and to consume calcium rich foods and take daily prenatal vitamin.  3. Supplements: Ensure Complete BID 4. Discussed nighttime snacks with kitchen staff and nursing. RN is supposed to bring printed out picture of pt to kitchen to get HS snack for pt. Discussed with RN who expressed understanding.   NUTRITION DIAGNOSIS: Increased nutrient needs related to pregnancy as evidenced by MD notes.   Goal: Pt to meet >/= 90% of their estimated nutrition needs.  Monitor:  PO intake  Assessment:  Pt states that she has been using crack cocaine ( 3 years), opioids (5 years), heroin powder (on and off two years) marijuana every day. No alcohol. Smokes half a pack a day. She is pregnant [redacted] weeks. Last use of opioids three days PTA.   Met with pt who reports always getting hungry at night but only sometimes gets snacks from the kitchen. Reports excellent appetite and typically eating 3 meals/day. C/o having nausea in the morning and sometimes feeling like she wants to throw up, but she doesn't. Interested in getting Ensure Complete for additional nutrition, will order.   28 y.o. female  Height: Ht Readings from Last 1 Encounters:  06/16/13 5' 3.5" (1.613 m)    Weight: Wt Readings from Last 1 Encounters:  06/16/13 152 lb (68.947 kg)    Weight Hx: Wt Readings from Last 10 Encounters:  06/16/13 152 lb (68.947 kg)  05/30/13 156 lb (70.761 kg)  05/23/13 158 lb 4 oz (71.782 kg)  05/03/13 150 lb (68.04 kg)  03/28/13 143 lb (64.864 kg)  09/04/12 150 lb (68.04 kg)  04/17/12 149 lb (67.586 kg)  03/26/12 145 lb (65.772 kg)  09/07/11 129 lb (58.514 kg)  03/12/11 143 lb 3.2 oz (64.955  kg)    BMI:  Body mass index is 26.5 kg/(m^2). *not indicative of being overweight r/t pt being pregnant   Estimated Nutritional Needs: Kcal: 2040 Protein: 75-80 gram/day Fluid: >2 L/day  Diet Order: General Pt is also offered choice of unit snacks mid-morning and mid-afternoon.  Pt is eating as desired.   Lab results and medications reviewed. Potassium low. Getting daily prenatal vitamin and folic acid.   Mikey College MS, [place], [place] Pager [phone] After Hours Pager

## 2013-06-20 NOTE — Progress Notes (Signed)
Did attend group 

## 2013-06-20 NOTE — [facility] Group Notes (Signed)
[facility] Group Notes:  (Nursing/MHT/Case Management/Adjunct)  Date:  06/20/2013  Time:  10:17 AM  Type of Therapy:  Nurse Education  Participation Level:  Minimal  Participation Quality:  Drowsy  Affect:  Tearful  Cognitive:  Lacking  Insight:  Lacking  Engagement in Group:  Lacking  Modes of Intervention:  Discussion  Summary of Progress/Problems:pt showed up for group but slept and appeared unsteady on her feet. NP made aware./   Rodman KeyWebb, Donaciano Range Ms [facility] Center[facility] 06/20/2013, 10:17 AM

## 2013-06-21 DIAGNOSIS — F1120 Opioid dependence, uncomplicated: Secondary | ICD-10-CM

## 2013-06-21 DIAGNOSIS — F329 Major depressive disorder, single episode, unspecified: Secondary | ICD-10-CM

## 2013-06-21 DIAGNOSIS — F1910 Other psychoactive substance abuse, uncomplicated: Secondary | ICD-10-CM

## 2013-06-21 DIAGNOSIS — F4310 Post-traumatic stress disorder, unspecified: Secondary | ICD-10-CM

## 2013-06-21 DIAGNOSIS — F1920 Other psychoactive substance dependence, uncomplicated: Secondary | ICD-10-CM

## 2013-06-21 DIAGNOSIS — F1210 Cannabis abuse, uncomplicated: Secondary | ICD-10-CM

## 2013-06-21 NOTE — Progress Notes (Signed)
Recreation Therapy Notes   Date: 02.27.2015 Time: 2:45pm Location: 100 [address] Dayroom    Group Topic: Communication, Team Building, Problem Solving  Goal Area(s) Addresses:  Patient will effectively work with peer towards shared goal.  Patient will identify skill used to make activity successful.  Patient will identify how skills used during activity can be used to build healthy support system.   Behavioral Response: Did not attend. Patient remained in group session long enough to hear guidelines of group activity, following explanation of group activity patient left group without explanation and did not return.   Marykay Lexenise L Iyesha Such, LRT/CTRS  Ashlynne Shetterly L 06/21/2013 4:08 PM

## 2013-06-21 NOTE — Tx Team (Signed)
Interdisciplinary Treatment Plan Update (Adult)  Date: 06/21/2013   Time Reviewed: 10:42 AM  Progress in Treatment:  Attending groups: Intermittently  Participating in groups: Minimally Taking medication as prescribed: Yes  Tolerating medication: Yes  Family/Significant othe contact made: No. SPE not required for this pt.   Patient understands diagnosis: Yes, AEB seeking treatment for cocaine/heroin detox and mood stabilization.  Discussing patient identified problems/goals with staff: Yes  Medical problems stabilized or resolved: Yes  Denies suicidal/homicidal ideation: Yes during admission/self report.  Patient has not harmed self or Others: Yes  New problem(s) identified:  Discharge Plan or Barriers: Pt hoping to get into [facility] in [place]-pt plans to continue using suboxine during course of pregnancy which may be an issue for inpatient treatment. Pt on waiting list. Maren ReamerSarah Yates from [facility] will call back by Tuesday to let pt know about admission status. Pt not accepted into [facility] or [facility] due to pregnancy/suboxine regimen.  Additional comments:n/a Reason for Continuation of Hospitalization: Suboxine-withdrawals/maintainence Mood stabilization Medication management  Estimated length of stay: 2-4 days  For review of initial/current patient goals, please see plan of care.  Attendees:  Patient:    Family:    Physician:  06/21/2013 10:42 AM   Nursing:Jennifer RN  06/21/2013 10:43 AM   Clinical Social Worker The Sherwin-WilliamsHeather Smart, LCSWA  06/21/2013 10:42 AM   Other: Chandra BatchAggie N. PA 06/21/2013 10:43 AM   Other: Alexia FreestonePatty RN 06/21/2013 10:43 AM   Other:    Other:    Scribe for Treatment Team:  The Sherwin-WilliamsHeather Smart LCSWA 06/21/2013 10:42 AM

## 2013-06-21 NOTE — Progress Notes (Signed)
Patient ID: Kathy ParsonsSarah D [name], female   DOB: 07-24-1985, 28 y.o.   MRN: [id number] [facility]  06/21/2013 3:12 PM Kathy ParsonsSarah D [name]  MRN:  [id number]  Subjective:  [name] is endorsing feeling some better, however, says that she is agitated. Adds that she is getting better by the day, it's just that she still needing sometime to get this accomplished. She denies any SIHI, AVH.  Diagnosis:   DSM5: Schizophrenia Disorders:  none Obsessive-Compulsive Disorders:  none Trauma-Stressor Disorders:  Posttraumatic Stress Disorder (309.81) Substance/Addictive Disorders:  Opioid Disorder - Severe (304.00) Depressive Disorders:  Major Depressive Disorder - Moderate (296.22) Total Time spent with patient: 30 minutes  Axis I: Substance Induced Mood Disorder  ADL's:  Intact  Sleep: Fair  Appetite:  Fair  Suicidal Ideation:  Plan:  denies Intent:  denies Means:  denies Homicidal Ideation:  Plan:  denies Intent:  denies Means:  denies AEB (as evidenced by):  Psychiatric Specialty Exam: Physical Exam  Review of Systems  Constitutional: Negative.   HENT: Negative.   Eyes: Negative.   Respiratory: Negative.   Cardiovascular: Negative.   Gastrointestinal: Positive for constipation.  Genitourinary: Negative.   Musculoskeletal: Negative.   Skin: Negative.   Endo/Heme/Allergies: Negative.   Psychiatric/Behavioral: Positive for substance abuse. The patient is nervous/anxious.     Blood pressure 109/67, pulse 79, temperature 98.3 F (36.8 C), temperature source Oral, resp. rate 19, height 5' 3.5" (1.613 m), weight 68.947 kg (152 lb), last menstrual period 02/28/2013.Body mass index is 26.5 kg/(m^2).  General Appearance: Fairly Groomed  Patent attorneyye Contact::  Fair  Speech:  Clear and Coherent  Volume:  fluctuates  Mood:  Anxious and worried  Affect:  anxious, worried  Thought Process:  Coherent and Goal Directed  Orientation:  Full (Time, Place, and Person)  Thought Content:  symtpoms,  worries, concerns  Suicidal Thoughts:  No  Homicidal Thoughts:  No  Memory:  Immediate;   Fair Recent;   Fair Remote;   Fair  Judgement:  Fair  Insight:  Present and Shallow  Psychomotor Activity:  Restlessness  Concentration:  Fair  Recall:  FiservFair  Fund of Knowledge:NA  Language: Fair  Akathisia:  No  Handed:    AIMS (if indicated):     Assets:  Desire for Improvement  Sleep:  Number of Hours: 5.5   Musculoskeletal: Strength & Muscle Tone: within normal limits Gait & Station: normal Patient leans: N/A  Current Medications: Current Facility-Administered Medications  Medication Dose Route Frequency Provider Last Rate Last Dose  . acetaminophen (TYLENOL) tablet 650 mg  650 mg Oral Q6H PRN Beau FannyJohn C Withrow, FNP      . buprenorphine (SUBUTEX) SL tablet 4 mg  4 mg Sublingual TID AC Rachael FeeIrving A Lugo, MD   4 mg at 06/21/13 1159  . diphenhydrAMINE (BENADRYL) capsule 25 mg  25 mg Oral Q8H PRN Rachael FeeIrving A Lugo, MD   25 mg at 06/21/13 13080621  . docusate sodium (COLACE) capsule 100 mg  100 mg Oral BID Rachael FeeIrving A Lugo, MD      . feeding supplement (ENSURE COMPLETE) (ENSURE COMPLETE) liquid 237 mL  237 mL Oral BID BM Lavena BullionHeather W Baron, RD   237 mL at 06/20/13 1545  . folic acid (FOLVITE) tablet 1 mg  1 mg Oral Daily Rachael FeeIrving A Lugo, MD   1 mg at 06/21/13 1159  . Glycerin (Adult) 2.1 G suppository 1 suppository  1 suppository Rectal Daily PRN Rachael FeeIrving A Lugo, MD   1 suppository at  06/20/13 1158  . hydrocortisone cream 0.5 %   Topical BID Sanjuana Kava, NP      . loratadine (CLARITIN) tablet 10 mg  10 mg Oral Daily Sanjuana Kava, NP   10 mg at 06/21/13 1158  . ondansetron (ZOFRAN-ODT) disintegrating tablet 4 mg  4 mg Oral Q6H PRN Kristeen Mans, NP      . polyethylene glycol (MIRALAX / GLYCOLAX) packet 17 g  17 g Oral BID Sanjuana Kava, NP   17 g at 06/20/13 0844  . prenatal multivitamin tablet 1 tablet  1 tablet Oral Q1200 Rachael Fee, MD   1 tablet at 06/21/13 1158  . traZODone (DESYREL) tablet 150 mg  150 mg  Oral QHS PRN Rachael Fee, MD   150 mg at 06/20/13 2214  . traZODone (DESYREL) tablet 50 mg  50 mg Oral BID PRN Rachael Fee, MD        Lab Results: No results found for this or any previous visit (from the past 48 hour(s)).  Physical Findings: AIMS: Facial and Oral Movements Muscles of Facial Expression: None, normal Lips and Perioral Area: None, normal Jaw: None, normal Tongue: None, normal,Extremity Movements Upper (arms, wrists, hands, fingers): None, normal Lower (legs, knees, ankles, toes): None, normal, Trunk Movements Neck, shoulders, hips: None, normal, Overall Severity Severity of abnormal movements (highest score from questions above): None, normal Incapacitation due to abnormal movements: None, normal Patient's awareness of abnormal movements (rate only patient's report): No Awareness, Dental Status Current problems with teeth and/or dentures?: No Does patient usually wear dentures?: No  CIWA:  CIWA-Ar Total: 4 COWS:  COWS Total Score: 6  Treatment Plan Summary: Daily contact with patient to assess and evaluate symptoms and progress in treatment Medication management  Plan: Supportive approach/coping skills/relapse prevention Continue  Subutex 8 mg BID. Continue current treatment plan.  Medical Decision Making Problem Points:  Established problem, worsening (2) and Review of psycho-social stressors (1) Data Points:  Review of medication regiment & side effects (2) Review of new medications or change in dosage (2)  I certify that inpatient services furnished can reasonably be expected to improve the patient's condition.   Armandina Stammer I, PMHNP-BC 06/21/2013, 3:12 PM

## 2013-06-21 NOTE — [facility] Group Notes (Signed)
[facility]   06/21/2013 9:21 AM  Participation Quality:  Minimal   Mood/Affect:  Lethargic  Depression Rating:  0  Anxiety Rating:  5  Thoughts of Suicide:  No Will you contract for safety?   NA  Current AVH:  No  Plan for Discharge/Comments:  Pt reports feeling drowsy. She stated that [facility]. Pt fell back asleep after talking with CSW during group.   Transportation Means: unknown at this time.   Supports: boyfriend/limited family supports   Counselling psychologistmart, OncologistHeather LCSWA

## 2013-06-21 NOTE — [facility] Group Notes (Signed)
[facility] LCSW Group Therapy  06/21/2013 2:39 PM  Type of Therapy:  Group Therapy  Participation Level:  Minimal  Participation Quality:  Drowsy and Inattentive  Affect:  Flat and Lethargic  Cognitive:  Lacking  Insight:  Limited  Engagement in Therapy:  Limited  Modes of Intervention:  Confrontation, Discussion, Education, Exploration, Problem-solving, Rapport Building, Socialization and Support  Summary of Progress/Problems: Feelings around Relapse. Group members discussed the meaning of relapse and shared personal stories of relapse, how it affected them and others, and how they perceived themselves during this time. Group members were encouraged to identify triggers, warning signs and coping skills used when facing the possibility of relapse. Social supports were discussed and explored in detail. Post Acute Withdrawal Syndrome (handout provided) was introduced and examined. Pt's were encouraged to ask questions, talk about key points associated with PAWS, and process this information in terms of relapse prevention. [name] was inattentive and drowsy throughout today's group. She had difficulty remaining awake during group. [name] shared that she was going to a long term recovery based healthy environment. She mentioned that People places and things get her in trouble and more susceptible to relaspe. [name] shows improving insight AEB her ability to process how going to a different city and being in a supportive environment will help her cope with PAWS and prevent relapse.    Smart, [name] LCSWA  06/21/2013, 2:39 PM

## 2013-06-22 DIAGNOSIS — IMO0002 Reserved for concepts with insufficient information to code with codable children: Secondary | ICD-10-CM

## 2013-06-22 MED ORDER — MAGNESIUM CITRATE PO SOLN
1.0000 | Freq: Once | ORAL | Status: AC
  Administered 2013-06-22: 1 via ORAL

## 2013-06-22 MED ORDER — METRONIDAZOLE 500 MG PO TABS
500.0000 mg | ORAL_TABLET | Freq: Two times a day (BID) | ORAL | Status: DC
  Administered 2013-06-22 – 2013-06-23 (×3): 500 mg via ORAL
  Filled 2013-06-22 (×5): qty 1

## 2013-06-22 MED ORDER — B COMPLEX-C PO TABS
1.0000 | ORAL_TABLET | Freq: Every day | ORAL | Status: DC
  Administered 2013-06-22 – 2013-06-27 (×6): 1 via ORAL
  Filled 2013-06-22 (×5): qty 1
  Filled 2013-06-22: qty 14
  Filled 2013-06-22 (×3): qty 1

## 2013-06-22 NOTE — Progress Notes (Signed)
Upon review of patient charts and OB/GYN([facility]) notation. Patient had positive STD screening for Trich on 05/30/2013. Several attempts were made by the nurse to inform patient of test results with no luck. Pt advised of results, and verbalized agreement of treatment plan. Partner has not been notified but he will also need treatment. Pt advised. No safety contraindications with Metronidazole. Will prescribe Metronidazole 500 mg 1 tablet po BID x 7 days. Will continue to monitor. Untreated trichomonal vaginitis can lead to Preterm labor.

## 2013-06-22 NOTE — Progress Notes (Signed)
D: pt had an episode of emesis this evening. Pt given Zofran and ginger ale, pt stated she felt better afterward. Writer spoke with pt 1:1 on. Pt became tearful upon conversation. Pt feels as though everyone is out to get her and is gaining up on her ( staff and other pts). Writer told pt not to worry about what others are saying about her and to focus on bettering herself for not only her children but herself as well. Pt loud and intrusive to others conversations on milieu. Pt cooperative. Denies si/hi/avh. Denies pain A: scheduled medications given. Support and encouragement offered. q 15 min safety checks

## 2013-06-22 NOTE — Progress Notes (Addendum)
Adult Psychoeducational Group Note  Date:  06/22/2013 Time:  1315  Group Topic/Focus:  Recovery Goals:   The focus of this group is to identify appropriate goals for recovery and establish a plan to achieve them. Relapse Prevention Planning:   The focus of this group is to define relapse and discuss the need for planning to combat relapse.  Participation Level:  Did Not Attend; pt attended 500 hall group.   Deliah Strehlow Shari Prowsvan 06/22/2013, 2:27 PM

## 2013-06-22 NOTE — [facility] Group Notes (Signed)
[facility] Group Notes:  (Clinical Social Work)  06/22/2013     10-11AM  Summary of Progress/Problems:   The main focus of today's process group was for the patient to identify ways in which they have in the past sabotaged their own recovery. Motivational Interviewing and a worksheet were utilized to help patients explore the perceived benefits and costs of their substance use, as well as the potential benefits and costs of stopping.  The Stages of Change were explained and patients were encouraged to make a written plan based on what was shared in group today. The patient expressed that if the use of substances were stopped, she would expect to feel "not so empty, more able to deal with my feelings". The benefit of her substance abuse is that it lets her escape from reality and the feelings she does not want to deal with.  She left at this point of group.  Type of Therapy:  Group Therapy - Process   Participation Level:  Active  Participation Quality:  Attentive and Intrusive  Affect:  Blunted and Irritable  Cognitive:  Oriented  Insight:  Improving  Engagement in Therapy:  Improving  Modes of Intervention:  Education, Support and Processing, Motivational Interviewing  Ambrose MantleMareida Grossman-Orr, LCSW 06/22/2013, 12:34 PM

## 2013-06-22 NOTE — Progress Notes (Addendum)
Patient ID: JASHLEY YELLIN, female   DOB: 04-28-1985, 28 y.o.   MRN: [id number] Patient ID: SWANNIE MILIUS, female   DOB: 11-02-1985, 28 y.o.   MRN: [id number] [facility] MD Progress Note  06/22/2013 9:47 AM SHALEN PETRAK  MRN:  [id number]  Subjective:  Kortlyn is she feels as though she is declining, and says that she is agitated. She is inquiring about increasing her Subutex dose to the max dose of 16mg (per Dr. Dub Mikes). She states her withdrawal symptoms are causing her to itch and she is scratching a lot. She also inquires about VIstaril, which she knows is the same as Benadryl that she already has prescribed. She denies any SIHI, AVH. Reports that she is not sleeping well"on and off", which she believes is why she is so tired during the day.  She appears very drowsy during the interview.   Diagnosis:   DSM5: Schizophrenia Disorders:  none Obsessive-Compulsive Disorders:  none Trauma-Stressor Disorders:  Posttraumatic Stress Disorder (309.81) Substance/Addictive Disorders:  Opioid Disorder - Severe (304.00) Depressive Disorders:  Major Depressive Disorder - Moderate (296.22) Total Time spent with patient: 30 minutes  Axis I: Substance Induced Mood Disorder  ADL's:  Intact  Sleep: Fair  Appetite:  Fair  Suicidal Ideation:  Plan:  denies Intent:  denies Means:  denies Homicidal Ideation:  Plan:  denies Intent:  denies Means:  denies AEB (as evidenced by):  Psychiatric Specialty Exam: Physical Exam  Review of Systems  Constitutional: Negative.   HENT: Negative.   Eyes: Negative.   Respiratory: Negative.   Cardiovascular: Negative.   Gastrointestinal: Positive for constipation.  Genitourinary: Negative.   Musculoskeletal: Negative.   Skin: Negative.   Endo/Heme/Allergies: Negative.   Psychiatric/Behavioral: Positive for substance abuse. The patient is nervous/anxious.     Blood pressure 97/61, pulse 71, temperature 97.5 F (36.4 C), temperature source Oral, resp. rate 16,  height 5' 3.5" (1.613 m), weight 68.947 kg (152 lb), last menstrual period 02/28/2013.Body mass index is 26.5 kg/(m^2).  General Appearance: Fairly Groomed  Patent attorney::  Fair  Speech:  Clear and Coherent  Volume:  fluctuates  Mood:  Depressed and worried  Affect:  Depressed, Flat and worried  Thought Process:  Coherent and Goal Directed  Orientation:  Full (Time, Place, and Person)  Thought Content:  symtpoms, worries, concerns  Suicidal Thoughts:  No  Homicidal Thoughts:  No  Memory:  Immediate;   Fair Recent;   Fair Remote;   Fair  Judgement:  Fair  Insight:  Present and Shallow  Psychomotor Activity:  Restlessness and Drowsy  Concentration:  Fair  Recall:  [employer] of Knowledge:NA  Language: Fair  Akathisia:  No  Handed:    AIMS (if indicated):     Assets:  Desire for Improvement  Sleep:  Number of Hours: 5.5   Musculoskeletal: Strength & Muscle Tone: within normal limits Gait & Station: normal Patient leans: N/A  Current Medications: Current Facility-Administered Medications  Medication Dose Route Frequency Provider Last Rate Last Dose  . acetaminophen (TYLENOL) tablet 650 mg  650 mg Oral Q6H PRN Beau Fanny, FNP      . buprenorphine (SUBUTEX) SL tablet 4 mg  4 mg Sublingual TID AC Rachael Fee, MD   4 mg at 06/22/13 5366  . diphenhydrAMINE (BENADRYL) capsule 25 mg  25 mg Oral Q8H PRN Rachael Fee, MD   25 mg at 06/22/13 4403  . docusate sodium (COLACE) capsule 100 mg  100 mg Oral BID Madie Reno  Jorja LoaA Lugo, MD   100 mg at 06/22/13 0850  . feeding supplement (ENSURE COMPLETE) (ENSURE COMPLETE) liquid 237 mL  237 mL Oral BID BM Lavena BullionHeather W Baron, RD   237 mL at 06/20/13 1545  . folic acid (FOLVITE) tablet 1 mg  1 mg Oral Daily Rachael FeeIrving A Lugo, MD   1 mg at 06/22/13 0850  . Glycerin (Adult) 2.1 G suppository 1 suppository  1 suppository Rectal Daily PRN Rachael FeeIrving A Lugo, MD   1 suppository at 06/20/13 1158  . hydrocortisone cream 0.5 %   Topical BID Sanjuana KavaAgnes I Nwoko, NP      .  loratadine (CLARITIN) tablet 10 mg  10 mg Oral Daily Sanjuana KavaAgnes I Nwoko, NP   10 mg at 06/22/13 0850  . polyethylene glycol (MIRALAX / GLYCOLAX) packet 17 g  17 g Oral BID Sanjuana KavaAgnes I Nwoko, NP   17 g at 06/20/13 0844  . prenatal multivitamin tablet 1 tablet  1 tablet Oral Q1200 Rachael FeeIrving A Lugo, MD   1 tablet at 06/21/13 1158  . traZODone (DESYREL) tablet 150 mg  150 mg Oral QHS PRN Rachael FeeIrving A Lugo, MD   150 mg at 06/21/13 2115  . traZODone (DESYREL) tablet 50 mg  50 mg Oral BID PRN Rachael FeeIrving A Lugo, MD        Lab Results: No results found for this or any previous visit (from the past 48 hour(s)).  Physical Findings: AIMS: Facial and Oral Movements Muscles of Facial Expression: None, normal Lips and Perioral Area: None, normal Jaw: None, normal Tongue: None, normal,Extremity Movements Upper (arms, wrists, hands, fingers): None, normal Lower (legs, knees, ankles, toes): None, normal, Trunk Movements Neck, shoulders, hips: None, normal, Overall Severity Severity of abnormal movements (highest score from questions above): None, normal Incapacitation due to abnormal movements: None, normal Patient's awareness of abnormal movements (rate only patient's report): No Awareness, Dental Status Current problems with teeth and/or dentures?: No Does patient usually wear dentures?: No  CIWA:  CIWA-Ar Total: 1 COWS:  COWS Total Score: 6  Treatment Plan Summary: Daily contact with patient to assess and evaluate symptoms and progress in treatment Medication management  Plan: Supportive approach/coping skills/relapse prevention Continue  Subutex 8 mg BID. Pt advised to discuss with Dr. Dub MikesLugo increase of medication. WIll continue to monitor.  Continue current treatment plan. Will add Vitamin B complex for energy and mental concentration.  Will add magnesium citrate for constipation.   Medical Decision Making Problem Points:  Established problem, worsening (2) and Review of psycho-social stressors (1) Data Points:   Review of medication regiment & side effects (2) Review of new medications or change in dosage (2)  I certify that inpatient services furnished can reasonably be expected to improve the patient's condition.   Truman HaywardSTARKES, TAKIA S, PMHNP-BC 06/22/2013, 9:47 AM

## 2013-06-22 NOTE — Progress Notes (Signed)
Patient ID: Kathy ParsonsSarah D [name], female   DOB: 04/14/1986, 28 y.o.   MRN: [id number] D: Pt is awake and active on the unit this AM. Pt denies SI/HI and A/V hallucinations. Pt rates their depression at 1 and hopelessness at 1. Pt's most recent COWS score was 2. Pt is demanding and intrusive but cooperative with staff. Pt was speaking with profanity loudly in the dayroom, but she was redirectable and compliant with staff request to respect unit policy. Pt states that she has not had a bowel movement in 3 days. NP notified. Pt is attending groups.  A: Encouraged pt to discuss feelings with staff and administered medication per MD orders. Writer also encouraged pt to participate in groups.  R: Writer will continue to monitor. 15 minute checks are ongoing for safety.

## 2013-06-22 NOTE — Progress Notes (Signed)
Pt attended the evening AA group in the dayroom. 

## 2013-06-22 NOTE — Progress Notes (Addendum)
Pt is presently on the 500 hall in the dayroom with the other pts. She appears in good spirits and has been programming on the 500 hall.Pt did state that she has depression a 1/10 and hopelessness a 1/10. Pt [facility] and is looking forward to having her 28 year old daughter with her. Pt denies SI and HI and contracts for safety. Pt refused her colace and miralex. She stated she needed a benedryl for her itching and was given this at 5pm.

## 2013-06-22 NOTE — Progress Notes (Signed)
Pt attended AA group this evening.  

## 2013-06-23 ENCOUNTER — Encounter (HOSPITAL_COMMUNITY): Payer: Self-pay | Admitting: Family

## 2013-06-23 MED ORDER — METRONIDAZOLE 500 MG PO TABS
2000.0000 mg | ORAL_TABLET | ORAL | Status: AC
  Administered 2013-06-23: 2000 mg via ORAL
  Filled 2013-06-23: qty 4

## 2013-06-23 MED ORDER — PROMETHAZINE HCL 25 MG PO TABS
25.0000 mg | ORAL_TABLET | ORAL | Status: AC
  Administered 2013-06-23: 25 mg via ORAL
  Filled 2013-06-23: qty 1

## 2013-06-23 MED ORDER — TRIAMCINOLONE ACETONIDE 0.1 % EX CREA
TOPICAL_CREAM | Freq: Two times a day (BID) | CUTANEOUS | Status: DC
  Administered 2013-06-23 – 2013-06-27 (×8): via TOPICAL
  Filled 2013-06-23 (×2): qty 15

## 2013-06-23 NOTE — Progress Notes (Signed)
Pt came up to the nurses station in tears. Pt stated " i want to use so bad right now. i was dreaming and it seemed so real. i could taste it on my tongue." writer spoke with pt about overcoming are want to use and powering through the urge. Writer gave pt medication to help calm pt down. Pt sitting in med room writing in journal and working on puzzle. Pt stated " i dont want to go back to sleep. i dont want to have the same dream."

## 2013-06-23 NOTE — Progress Notes (Signed)
D: pt stated she has been doing all right today. Pt stated she has been experiencing some lower belly pain, but other than that she has been fine. denies si/hi/avh. Pt seen sitting in dayroom engaging with others. Pt is still loud with conversation, but cooperative.  A: scheduled medications given. 1:1 time given. q 15 min safety checks R: pt remains safe on unit. No further complaints at this time.

## 2013-06-23 NOTE — Progress Notes (Signed)
Patient ID: Kathy ParsonsSarah D [name], female   DOB: 1985/05/07, 28 y.o.   MRN: [id number] Patient ID: Kathy ParsonsSarah D [name], female   DOB: 1985/05/07, 28 y.o.   MRN: [id number] [facility]  06/23/2013 9:54 AM Kathy ParsonsSarah D Henkels  MRN:  [id number]  Subjective:  [name] is she feels as though she is declining, and says that she is agitated. She is inquiring about increasing her Subutex dose to the max dose of 16mg (per Dr. Dub MikesLugo). She states her withdrawal symptoms are causing her to itch and she is scratching a lot. She also inquires about VIstaril, which she knows is the same as Benadryl that she already has prescribed. She denies any SIHI, AVH. Reports that she is not sleeping well"on and off", which she believes is why she is so tired during the day. She appears very drowsy during the interview. Reports she didn't sleep well last night, states she was having a vivid dream. States she is craving so bad, she needs some help.   Diagnosis:   DSM5: Schizophrenia Disorders:  none Obsessive-Compulsive Disorders:  none Trauma-Stressor Disorders:  Posttraumatic Stress Disorder (309.81) Substance/Addictive Disorders:  Opioid Disorder - Severe (304.00) Depressive Disorders:  Major Depressive Disorder - Moderate (296.22) Total Time spent with patient: 30 minutes  Axis I: Substance Induced Mood Disorder  ADL's:  Intact  Sleep: Fair  Appetite:  Fair  Suicidal Ideation:  Plan:  denies Intent:  denies Means:  denies Homicidal Ideation:  Plan:  denies Intent:  denies Means:  denies AEB (as evidenced by):  Psychiatric Specialty Exam: Physical Exam   Review of Systems  Constitutional: Negative.   HENT: Negative.   Eyes: Negative.   Respiratory: Negative.   Cardiovascular: Negative.   Gastrointestinal: Positive for constipation.  Genitourinary: Negative.   Musculoskeletal: Negative.   Skin: Negative.   Endo/Heme/Allergies: Negative.   Psychiatric/Behavioral: Positive for substance abuse. The patient is  nervous/anxious.     Blood pressure 112/76, pulse 75, temperature 97.5 F (36.4 C), temperature source Oral, resp. rate 17, height 5' 3.5" (1.613 m), weight 68.947 kg (152 lb), last menstrual period 02/28/2013.Body mass index is 26.5 kg/(m^2).  General Appearance: Fairly Groomed  Patent attorneyye Contact::  Fair  Speech:  Clear and Coherent  Volume:  fluctuates  Mood:  Depressed and worried  Affect:  Depressed, Flat and worried  Thought Process:  Coherent and Goal Directed  Orientation:  Full (Time, Place, and Person)  Thought Content:  symtpoms, worries, concerns  Suicidal Thoughts:  No  Homicidal Thoughts:  No  Memory:  Immediate;   Fair Recent;   Fair Remote;   Fair  Judgement:  Fair  Insight:  Present and Shallow  Psychomotor Activity:  Restlessness and Drowsy  Concentration:  Fair  Recall:  [employer]Fair  Fund of Knowledge:NA  Language: Fair  Akathisia:  No  Handed:    AIMS (if indicated):     Assets:  Desire for Improvement  Sleep:  Number of Hours: 4.5   Musculoskeletal: Strength & Muscle Tone: within normal limits Gait & Station: normal Patient leans: N/A  Current Medications: Current Facility-Administered Medications  Medication Dose Route Frequency Provider Last Rate Last Dose  . acetaminophen (TYLENOL) tablet 650 mg  650 mg Oral Q6H PRN Beau FannyJohn C Withrow, FNP      . B-complex with vitamin C tablet 1 tablet  1 tablet Oral Daily Truman Haywardakia S Starkes, FNP   1 tablet at 06/23/13 [id number]  . buprenorphine (SUBUTEX) SL tablet 4 mg  4 mg Sublingual TID AC  Rachael Fee, MD   4 mg at 06/23/13 1610  . diphenhydrAMINE (BENADRYL) capsule 25 mg  25 mg Oral Q8H PRN Rachael Fee, MD   25 mg at 06/23/13 0110  . docusate sodium (COLACE) capsule 100 mg  100 mg Oral BID Rachael Fee, MD   100 mg at 06/23/13 0804  . feeding supplement (ENSURE COMPLETE) (ENSURE COMPLETE) liquid 237 mL  237 mL Oral BID BM Lavena Bullion, RD   237 mL at 06/22/13 1400  . folic acid (FOLVITE) tablet 1 mg  1 mg Oral Daily Rachael Fee, MD   1 mg at 06/23/13 0804  . Glycerin (Adult) 2.1 G suppository 1 suppository  1 suppository Rectal Daily PRN Rachael Fee, MD   1 suppository at 06/20/13 1158  . hydrocortisone cream 0.5 %   Topical BID Sanjuana Kava, NP   1 application at 06/23/13 [phone]  . loratadine (CLARITIN) tablet 10 mg  10 mg Oral Daily Sanjuana Kava, NP   10 mg at 06/23/13 0804  . metroNIDAZOLE (FLAGYL) tablet 500 mg  500 mg Oral Q12H Truman Hayward, FNP   500 mg at 06/23/13 0804  . polyethylene glycol (MIRALAX / GLYCOLAX) packet 17 g  17 g Oral BID Sanjuana Kava, NP   17 g at 06/20/13 0844  . prenatal multivitamin tablet 1 tablet  1 tablet Oral Q1200 Rachael Fee, MD   1 tablet at 06/22/13 1200  . traZODone (DESYREL) tablet 150 mg  150 mg Oral QHS PRN Rachael Fee, MD   150 mg at 06/22/13 2140  . traZODone (DESYREL) tablet 50 mg  50 mg Oral BID PRN Rachael Fee, MD   50 mg at 06/23/13 0107    Lab Results: No results found for this or any previous visit (from the past 48 hour(s)).  Physical Findings: AIMS: Facial and Oral Movements Muscles of Facial Expression: None, normal Lips and Perioral Area: None, normal Jaw: None, normal Tongue: None, normal,Extremity Movements Upper (arms, wrists, hands, fingers): None, normal Lower (legs, knees, ankles, toes): None, normal, Trunk Movements Neck, shoulders, hips: None, normal, Overall Severity Severity of abnormal movements (highest score from questions above): None, normal Incapacitation due to abnormal movements: None, normal Patient's awareness of abnormal movements (rate only patient's report): No Awareness, Dental Status Current problems with teeth and/or dentures?: No Does patient usually wear dentures?: No  CIWA:  CIWA-Ar Total: 3 COWS:  COWS Total Score: 6  Treatment Plan Summary: Daily contact with patient to assess and evaluate symptoms and progress in treatment Medication management  Plan: Supportive approach/coping skills/relapse  prevention Continue  Subutex 8 mg BID. Pt advised to discuss with Dr. Dub Mikes increase of medication. WIll continue to monitor.  Continue current treatment plan. Will add Vitamin B complex for energy and mental concentration.  Will add magnesium citrate for constipation.   Medical Decision Making Problem Points:  Established problem, worsening (2) and Review of psycho-social stressors (1) Data Points:  Review of medication regiment & side effects (2) Review of new medications or change in dosage (2)  I certify that inpatient services furnished can reasonably be expected to improve the patient's condition.   Truman Hayward, PMHNP-BC 06/23/2013, 9:54 AM

## 2013-06-23 NOTE — Progress Notes (Signed)
During interview with patient this morning and physical assessment, patient appeared to be having pain. Upon further questioning patient states "that I just got kicked very hard by the baby". She then proceed to go from a sitting position to a standing position which was more comfortable for her, but still grimacing and making facial expressions. When asked to describe pain she stated that she it was sharp pain, that radiated from her back to her under her belly button. Further assessment revealed tightness and firm abdomen.  Pt was then escorted to her room and asked to lie down on her left side and drink cold water, while medical help was called. Staff [facility], agreed on transfer to higher level of care for safety of patient and the unborn fetus. [facility]. DDX: MS pain, Genitourinary disorders, GI-constipation. VS were obtained, and stable. Pt was made ready for transport. It does appear that she is G4P1, and considered high risk obesetrics due to polysubstance abuse, and current and history of STD/PID. Will continue to monitor.

## 2013-06-23 NOTE — MAU Provider Note (Signed)
Chief Complaint: No chief complaint on file.   First Provider Initiated Contact with Patient 06/23/13 1145     SUBJECTIVE HPI: [name] is a 27 y.o. [phone] pt of Dr Tamela Oddi at [redacted]w[redacted]d by LMP who presents to maternity admissions sent from [facility] for abdominal and back pain x24 hours.  She reports she has not had a BM in 8 days.  She is taking Colace and Miralax daily with no improvement in constipation.  She feeling fetal flutters, denies LOF, vaginal bleeding, vaginal itching/burning, urinary symptoms, h/a, dizziness, n/v, or fever/chills.    U/S in MAU at 14 weeks without abnormality.   Past Medical History  Diagnosis Date  . STD (female)     hx of chlamydia and gonorrhea  . History of PID   . Endometriosis   . Glaucoma   . Ovarian cyst   . Bartholin cyst   . Headache(784.0)   . Infection     UTI  . Vaginal Pap smear, abnormal     has not followed up   Past Surgical History  Procedure Laterality Date  . Fracture surgery      left leg  . Dilation and curettage of uterus    . Laparoscopy     History   Social History  . Marital Status: Single    Spouse Name: N/A    Number of Children: N/A  . Years of Education: N/A   Occupational History  . Not on file.   Social History Main Topics  . Smoking status: Current Every Day Smoker -- 0.25 packs/day for 7 years    Types: Cigarettes  . Smokeless tobacco: Never Used  . Alcohol Use: No     Comment: Denies ETOH use  . Drug Use: Yes    Special: Marijuana, Cocaine, Heroin     Comment: .denies use  . Sexual Activity: Yes    Birth Control/ Protection: Condom, None   Other Topics Concern  . Not on file   Social History Narrative  . No narrative on file   No current facility-administered medications on file prior to encounter.   Current Outpatient Prescriptions on File Prior to Encounter  Medication Sig Dispense Refill  . folic acid (FOLVITE) 1 MG tablet Take 1 tablet (1 mg total) by  mouth daily.  30 tablet  3  . ibuprofen (ADVIL,MOTRIN) 200 MG tablet Take 800 mg by mouth every 6 (six) hours as needed for mild pain or moderate pain.      . promethazine (PHENERGAN) 25 MG tablet Take 1 tablet (25 mg total) by mouth every 6 (six) hours as needed for nausea or vomiting.  30 tablet  0   Allergies  Allergen Reactions  . Acyclovir And Related Swelling  . Darvocet [Propoxyphene N-Acetaminophen] Hives  . Doxycycline Swelling  . Flexeril [Cyclobenzaprine] Nausea And Vomiting  . Metoclopramide Hives  . Naproxen Hives  . Latex Rash  . Tramadol Rash    ROS: Pertinent items in HPI  OBJECTIVE Blood pressure 103/57, pulse 62, temperature 98.1 F (36.7 C), temperature source Oral, resp. rate 16, height 5' 3.5" (1.613 m), weight 68.947 kg (152 lb), last menstrual period 02/28/2013. GENERAL: Well-developed, well-nourished female in no acute distress.  HEENT: Normocephalic HEART: normal rate RESP: normal effort ABDOMEN: Soft, non-tender EXTREMITIES: Nontender, no edema NEURO: Alert and oriented  Cervix closed/thick/high, posterior, large amount of hard stool palpable in rectum  FHT 154 by doppler   ASSESSMENT  Dx at [facility]: 1. Polysubstance dependence  including opioid type drug, continuous use   2. Major depression   3. PTSD (post-traumatic stress disorder)   4. Marijuana use   5. Opioid dependence   6. Polysubstance abuse   7. Tobacco use complicating pregnancy    Dx added [facility]: 8. Constipation in pregnancy in second trimester     PLAN Flagyl 2 g dose in MAU.  D/C Flagyl BID x1 week as ordered. Transfer to behavioral health.   Offered Fleet enema by RN in MAU. Pt prefers to do enema herself at inpatient facility.  Fleet enema ordered signed to be released upon transfer Keep scheduled prenatal visits with Dr Tamela OddiJackson-Moore Return to MAU as needed    Medication List    ASK your doctor about these medications        folic acid 1 MG tablet  Commonly known as:  FOLVITE  Take 1 tablet (1 mg total) by mouth daily.     ibuprofen 200 MG tablet  Commonly known as:  ADVIL,MOTRIN  Take 800 mg by mouth every 6 (six) hours as needed for mild pain or moderate pain.     promethazine 25 MG tablet  Commonly known as:  PHENERGAN  Take 1 tablet (25 mg total) by mouth every 6 (six) hours as needed for nausea or vomiting.       Follow-up Information   Follow up with [facility]. (waiting for call back-pt on waiting list. )    Contact information:   [address] [place], [place] [address] Phone: [phone] Fax: ?      Sharen CounterLisa Leftwich-Kirby Certified Nurse-Midwife 06/23/2013  12:04 PM

## 2013-06-23 NOTE — MAU Note (Signed)
Patient in the care of [facility]

## 2013-06-23 NOTE — Progress Notes (Signed)
Adult Psychoeducational Group Note  Date:  06/23/2013 Time:  6:35 PM  Group Topic/Focus:  Coping With Mental Health Crisis:   The purpose of this group is to help patients identify strategies for coping with mental health crisis.  Group discusses possible causes of crisis and ways to manage them effectively.  Participation Level:  Did Not Attend  Additional Comments:  Patient did not attend group. She preferred to stay in her room.  Rosilyn MingsMingia, Lupie Sawa A 06/23/2013, 6:35 PM

## 2013-06-23 NOTE — Progress Notes (Addendum)
Patient ID: Mancel ParsonsSarah D Rahimi, female   DOB: April 01, 1986, 28 y.o.   MRN: [id number] D: Pt is awake and active on the unit this AM. Pt denies SI/HI and A/V hallucinations. Pt rates their depression at 1 and hopelessness at 4. Pt's mood is depressed and her affect is flat/sad. Pt reports sleeping poorly last night and is not attending groups this AM. [facility] for increasing abdominal pain and nausea, possibly related to constipation. Pt reports no BM X8 days despite numerous interventions. Pt c/o bilateral side pain and lower abdominal pain. Pt states that her pain is 9/10. Pt refused Miralax this AM, although writer recommended it to help resolve constipation/pain.   A: Encouraged pt to discuss feelings with staff and administered medication per MD orders. Writer also encouraged pt to participate in groups, increase fluids and to rest if neccesary.   R: Writer will continue to monitor. 15 minute checks are ongoing for safety.

## 2013-06-23 NOTE — MAU Note (Signed)
28 yo, G4P1 at 382w3d, [facility]. Reports lower back pain and abdominal cramping. Denies VB.

## 2013-06-23 NOTE — Progress Notes (Signed)
D: pt is loud and has been cussing on the milieu. Pt upset that she cannot go over to the 500 hallway. Pt stated other staff has allowed her to do so and has allowed others from the 500 hall to come to the 300. denies si/hi/avh. Denies pain. Pt is agitated and annoyed. Pt did not attended the AA meeting this evening. Writer spoke with pt about behavior, pt somewhat receptive and has been quiet the rest of the evening.  A: q 15 min safety checks. 1:1 time given.  R: pt remains safe on unit. No complaints at this time

## 2013-06-23 NOTE — Progress Notes (Signed)
Patient did not attend the evening speaker AA meeting. Pt went to her room as soon as group began and remained in bed.

## 2013-06-23 NOTE — [facility] Group Notes (Signed)
[facility] Group Notes: (Clinical Social Work)   06/23/2013      Type of Therapy:  Group Therapy   Participation Level:  Did Not Attend due to medical emergency   Ambrose MantleMareida Grossman-Orr, LCSW 06/23/2013, 12:42 PM

## 2013-06-23 NOTE — Progress Notes (Signed)
Adult Psychoeducational Group Note  Date:  06/23/2013 Time:  0900  Group Topic/Focus:  Making Healthy Choices:   The focus of this group is to help patients identify negative/unhealthy choices they were using prior to admission and identify positive/healthier coping strategies to replace them upon discharge.  Participation Level:  Did Not Attend   Barbette MerinoJENSEN, Niva Murren Shari Prowsvan 06/23/2013, 2:06 PM

## 2013-06-24 MED ORDER — FLEET ENEMA 7-19 GM/118ML RE ENEM
1.0000 | ENEMA | RECTAL | Status: DC | PRN
  Administered 2013-06-24: 1 via RECTAL
  Filled 2013-06-24: qty 1

## 2013-06-24 NOTE — Clinical Social Work Note (Signed)
CSW contacted [facility]) to find out where pt was on waiting list/if there are any other needs during this waiting period. CSW left message with intake provider asking for someone to call back when possible to discuss the above.   The Sherwin-WilliamsHeather Smart, LCSWA  06/24/2013 3:48 PM

## 2013-06-24 NOTE — [facility] Group Notes (Signed)
[facility] LCSW Group Therapy  06/24/2013 3:17 PM  Type of Therapy:  Group Therapy  Participation Level:  Did Not Attend-pt in room resting/refused to attend afternoon group.   Smart, Yaret Hush LCSWA  06/24/2013, 3:17 PM

## 2013-06-24 NOTE — Progress Notes (Signed)
Patient ID: [name], female   DOB: 06/22/1985, 28 y.o.   MRN: [id number] She has been up and to parts of groups, interacting with peers and staff. Has been less intusive today. Self inventory: depression and hopelessness at 0, withdrawal chilling, craving. Agitation and she denies SI thought.  She has requested and received a fleets enema  this afternoon, She had very good results and says that she feels better.

## 2013-06-24 NOTE — [facility] Group Notes (Signed)
[facility]   06/24/2013 10:15 AM  Participation Quality:  Monopolizing/redirectable   Mood/Affect:  Appropriate  Depression Rating:  0  Anxiety Rating:  3  Thoughts of Suicide:  No Will you contract for safety?   NA  Current AVH:  No  Plan for Discharge/Comments:  Pt excited and labile during group. She reports issues with another patient but was redirected by CSW. Pt reports that she plans to call [facility] today to check status of waiting list. CSW will also call to check status. Pt reports feeling sick when she eats and has difficulty holding down her food. Pt reports good sleep.   Transportation Means: unknown at this time.   Supports: boyfriend   Counselling psychologistmart, OncologistHeather LCSWA

## 2013-06-24 NOTE — Progress Notes (Signed)
Adult Psychoeducational Group Note  Date:  06/24/2013 Time:  10:00 am  Group Topic/Focus:  Wellness Toolbox:   The focus of this group is to discuss various aspects of wellness, balancing those aspects and exploring ways to increase the ability to experience wellness.  Patients will create a wellness toolbox for use upon discharge.  Participation Level:  Did Not Attend   [name], [name] 06/24/2013, 10:51 AM

## 2013-06-24 NOTE — Progress Notes (Signed)
  D: Pt observed sleeping in bed with eyes closed. RR even and unlabored. No distress noted  .  A: Q 15 minute checks were done for safety.  R: safety maintained on unit.  

## 2013-06-24 NOTE — Progress Notes (Signed)
Patient ID: [name], female   DOB: 11-Sep-1985, 28 y.o.   MRN: [id number] Patient ID: [name], female   DOB: 11-Sep-1985, 28 y.o.   MRN: [id number] Patient ID: [name], female   DOB: 11-Sep-1985, 28 y.o.   MRN: [id number] [facility]  06/24/2013 1:57 PM [name]  MRN:  [id number]  Subjective:  Kathy SagoSarah reports/decribed her mood as being up and down. She says she is looking forward to going to the [facility] to stay and continue substance abuse treatment after her discharge from here. She hopes to go home first to get her clothes [facility]. Denies any new issues and or problems".  Diagnosis:   DSM5: Schizophrenia Disorders:  none Obsessive-Compulsive Disorders:  none Trauma-Stressor Disorders:  Posttraumatic Stress Disorder (309.81) Substance/Addictive Disorders:  Opioid Disorder - Severe (304.00) Depressive Disorders:  Major Depressive Disorder - Moderate (296.22) Total Time spent with patient: 30 minutes  Axis I: Substance Induced Mood Disorder  ADL's:  Intact  Sleep: Fair  Appetite:  Fair  Suicidal Ideation:  Plan:  denies Intent:  denies Means:  denies  Homicidal Ideation:  Plan:  denies Intent:  denies Means:  denies  AEB (as evidenced by): Per patient's reports  Psychiatric Specialty Exam: Physical Exam  Review of Systems  Constitutional: Negative.   HENT: Negative.   Eyes: Negative.   Respiratory: Negative.   Cardiovascular: Negative.   Gastrointestinal: Positive for constipation.  Genitourinary: Negative.   Musculoskeletal: Negative.   Skin: Negative.   Endo/Heme/Allergies: Negative.   Psychiatric/Behavioral: Positive for substance abuse. The patient is nervous/anxious.     Blood pressure 107/65, pulse 80, temperature 98.4 F (36.9 C), temperature source Oral, resp. rate 16, height 5' 3.5" (1.613 m), weight 68.947 kg (152 lb), last menstrual period 02/28/2013, SpO2 97.00%.Body mass index is 26.5 kg/(m^2).   General Appearance: Fairly Groomed  Patent attorneyye Contact::  Fair  Speech:  Clear and Coherent  Volume:  fluctuates  Mood:  Depressed and worried  Affect:  Depressed, Flat and worried  Thought Process:  Coherent and Goal Directed  Orientation:  Full (Time, Place, and Person)  Thought Content:  symtpoms, worries, concerns, denies any psychotic symptoms  Suicidal Thoughts:  No  Homicidal Thoughts:  No  Memory:  Immediate;   Fair Recent;   Fair Remote;   Fair  Judgement:  Fair  Insight:  Present and Shallow  Psychomotor Activity:  Restlessness and Drowsy  Concentration:  Fair  Recall:  FiservFair  Fund of Knowledge:NA  Language: Fair  Akathisia:  No  Handed:    AIMS (if indicated):     Assets:  Desire for Improvement  Sleep:  Number of Hours: 6.25   Musculoskeletal: Strength & Muscle Tone: within normal limits Gait & Station: normal Patient leans: N/A  Current Medications: Current Facility-Administered Medications  Medication Dose Route Frequency Provider Last Rate Last Dose  . acetaminophen (TYLENOL) tablet 650 mg  650 mg Oral Q6H PRN Beau FannyJohn C Withrow, FNP   650 mg at 06/23/13 2134  . B-complex with vitamin C tablet 1 tablet  1 tablet Oral Daily Truman Haywardakia S Starkes, FNP   1 tablet at 06/24/13 0830  . buprenorphine (SUBUTEX) SL tablet 4 mg  4 mg Sublingual TID AC Rachael FeeIrving A Lugo, MD   4 mg at 06/24/13 1158  . diphenhydrAMINE (BENADRYL) capsule 25 mg  25 mg Oral Q8H PRN Rachael FeeIrving A Lugo, MD   25 mg at 06/24/13 [phone]  . docusate sodium (COLACE)  capsule 100 mg  100 mg Oral BID Rachael Fee, MD   100 mg at 06/24/13 0830  . feeding supplement (ENSURE COMPLETE) (ENSURE COMPLETE) liquid 237 mL  237 mL Oral BID BM Lavena Bullion, RD   237 mL at 06/24/13 1338  . folic acid (FOLVITE) tablet 1 mg  1 mg Oral Daily Rachael Fee, MD   1 mg at 06/24/13 0830  . Glycerin (Adult) 2.1 G suppository 1 suppository  1 suppository Rectal Daily PRN Rachael Fee, MD   1 suppository at 06/20/13 1158  . loratadine (CLARITIN)  tablet 10 mg  10 mg Oral Daily Sanjuana Kava, NP   10 mg at 06/24/13 4098  . polyethylene glycol (MIRALAX / GLYCOLAX) packet 17 g  17 g Oral BID Sanjuana Kava, NP   17 g at 06/20/13 0844  . prenatal multivitamin tablet 1 tablet  1 tablet Oral Q1200 Rachael Fee, MD   1 tablet at 06/24/13 1159  . sodium phosphate (FLEET) 7-19 GM/118ML enema 1 enema  1 enema Rectal Q3 days PRN Hurshel Party, CNM   1 enema at 06/24/13 1349  . traZODone (DESYREL) tablet 150 mg  150 mg Oral QHS PRN Rachael Fee, MD   150 mg at 06/23/13 2132  . traZODone (DESYREL) tablet 50 mg  50 mg Oral BID PRN Rachael Fee, MD   50 mg at 06/23/13 0107  . triamcinolone cream (KENALOG) 0.1 %   Topical BID Truman Hayward, FNP        Lab Results: No results found for this or any previous visit (from the past 48 hour(s)).  Physical Findings: AIMS: Facial and Oral Movements Muscles of Facial Expression: None, normal Lips and Perioral Area: None, normal Jaw: None, normal Tongue: None, normal,Extremity Movements Upper (arms, wrists, hands, fingers): None, normal Lower (legs, knees, ankles, toes): None, normal, Trunk Movements Neck, shoulders, hips: None, normal, Overall Severity Severity of abnormal movements (highest score from questions above): None, normal Incapacitation due to abnormal movements: None, normal Patient's awareness of abnormal movements (rate only patient's report): No Awareness, Dental Status Current problems with teeth and/or dentures?: No Does patient usually wear dentures?: No  CIWA:  CIWA-Ar Total: 2 COWS:  COWS Total Score: 6  Treatment Plan Summary: Daily contact with patient to assess and evaluate symptoms and progress in treatment Medication management  Plan: Review of chart, vital signs, medications, and notes. 1-Individual and group therapy 2-Medication management for depression and anxiety:  Medications reviewed with the patient, denies any adverse effects. 3-Coping skills for  depression, anxiety, and substance dependency 4-Continue crisis stabilization and management 5-Address health issues--monitoring vital signs, stable 6-Treatment plan in progress to prevent relapse of depression, substance abuse, and anxiety  Medical Decision Making Problem Points:  Established problem, worsening (2) and Review of psycho-social stressors (1) Data Points:  Review of medication regiment & side effects (2) Review of new medications or change in dosage (2)  I certify that inpatient services furnished can reasonably be expected to improve the patient's condition.   Armandina Stammer I, PMHNP-BC 06/24/2013, 1:57 PM

## 2013-06-24 NOTE — Progress Notes (Signed)
Adult Psychoeducational Group Note  Date:  06/24/2013 Time: 8:00 PM  Group Topic/Focus:  AA  Participation Level:  Active  Participation Quality:  Redirectable and Sharing  Affect:  Appropriate  Cognitive:  Alert and Oriented  Insight: Appropriate  Engagement in Group:  Engaged  Modes of Intervention:  Discussion and Education  Additional Comments:    Humberto SealsWhitaker, [name] [name] 06/24/2013, 10:20 PM

## 2013-06-25 ENCOUNTER — Encounter: Payer: Self-pay | Admitting: Obstetrics & Gynecology

## 2013-06-25 MED ORDER — DIPHENHYDRAMINE HCL 25 MG PO CAPS
50.0000 mg | ORAL_CAPSULE | Freq: Three times a day (TID) | ORAL | Status: DC | PRN
  Administered 2013-06-25 – 2013-06-27 (×5): 50 mg via ORAL
  Filled 2013-06-25 (×5): qty 2

## 2013-06-25 NOTE — Progress Notes (Signed)
Patient ID: Kathy ParsonsSarah D Olmsted, female   DOB: 1985/06/03, [age] y.o.   MRN: [id number] She has been up and to groups, interacting with peers and staff. Se;f inventory: No depression or hopelessness, No SI thoughts. Withdrawals of craving, chilling, and agitation. She has been less intrusive today, has been calm and cooperative. Says that she is feeling better today.

## 2013-06-25 NOTE — [facility] Group Notes (Signed)
[facility] LCSW Group Therapy  06/25/2013 2:55 PM  Type of Therapy:  Group Therapy  Participation Level:  Did Not Attend-PT IN ROOM SLEEPING/REFUSED TO ATTEND GROUP.   Smart, Jamyron Redd LCSWA 06/25/2013, 2:55 PM

## 2013-06-25 NOTE — Progress Notes (Signed)
D: pt has been pleasant this evening. Calm and cooperative, not loud and argumentative in hallways. Pt stated she wants to leave on Thursday. Pt stated she will be living with her baby's daddy. Denies si/hi/avh. Denies pain A: support and encouragement given. q 15 min safety checks. R: pt remains safe on unit. No complaints or signs of distress

## 2013-06-25 NOTE — Progress Notes (Signed)
Patient ID: Mancel ParsonsSarah D Conway, female   DOB: 06-07-1985, 28 y.o.   MRN: [id number]   D: Informed the writer that she [facility] until she[facility] [place] a program [place]. Pt reported positive results from an enema she'd received during morning shift. Pt was positive for groups and voiced no other concerns or questions.   A:  Support and encouragement was offered. 15 min checks continued for safety.  R: Pt remains safe.

## 2013-06-25 NOTE — Progress Notes (Signed)
Recreation Therapy Notes  Animal-Assisted Activity/Therapy (AAA/T) Program Checklist/Progress Notes Patient Eligibility Criteria Checklist & Daily Group note for Rec Tx Intervention  Date: 03.03.2015 Time: 2:45pm [address]    AAA/T Program Assumption of Risk Form signed by Patient/ or Parent Legal Guardian yes  Patient is free of allergies or sever asthma yes  Patient reports no fear of animals yes  Patient reports no history of cruelty to animals yes   Patient understands his/her participation is voluntary yes  Patient washes hands before animal contact yes  Patient washes hands after animal contact yes  Behavioral Response: Engaged, Attentive, Appropriate   Education: Hand Washing, Appropriate Animal Interaction   Education Outcome: Acknowledges understanding   Clinical Observations/Feedback: Patient actively engaged with therapy dog, patient pet dog and smiled while doing so. Patient asked appropriate questions about therapy dog. Patient interacted with peer, LRT and dog team appropriately.   Marykay Lexenise L Irving Lubbers, LRT/CTRS  Jearl KlinefelterBlanchfield, Simcha Farrington L 06/25/2013 4:48 PM

## 2013-06-25 NOTE — Progress Notes (Signed)
[facility]  06/25/2013 6:34 PM [name]  MRN:  [id number] Subjective:  Worried about not being able to go to the residential treatment program. She states she is having using dreams. She is concerned that if she was not to be able to go she will go back to using. Admits that these drugs have a hold over her. Was able to go to the bathroom but states she feels she still needs to go. Admits to persistent anxiety Diagnosis:   DSM5: Schizophrenia Disorders:  none Obsessive-Compulsive Disorders:  none Trauma-Stressor Disorders:  Posttraumatic Stress Disorder (309.81) Substance/Addictive Disorders:  Opioid Disorder - Severe (304.00) Depressive Disorders:  Major Depressive Disorder - Moderate (296.22) Total Time spent with patient: 30 minutes  Axis I: Substance Induced Mood Disorder  ADL's:  Intact  Sleep: Poor  Appetite:  Fair  Suicidal Ideation:  Plan:  denies Intent:  denies Means:  denies Homicidal Ideation:  Plan:  denies Intent:  denies Means:  denies AEB (as evidenced by):  Psychiatric Specialty Exam: Physical Exam  Review of Systems  Constitutional: Negative.   HENT: Negative.   Eyes: Negative.   Respiratory: Negative.   Gastrointestinal: Positive for constipation.  Musculoskeletal: Positive for back pain.  Skin: Negative.   Neurological: Negative.   Endo/Heme/Allergies: Negative.   Psychiatric/Behavioral: Positive for substance abuse. The patient is nervous/anxious and has insomnia.     Blood pressure 124/73, pulse 84, temperature 98.1 F (36.7 C), temperature source Oral, resp. rate 16, height 5' 3.5" (1.613 m), weight 68.947 kg (152 lb), last menstrual period 02/28/2013, SpO2 97.00%.Body mass index is 26.5 kg/(m^2).  General Appearance: Fairly Groomed  Patent attorneyye Contact::  Fair  Speech:  Clear and Coherent  Volume:  fluctuates  Mood:  Anxious, Depressed and worried, frustrated fearful  Affect:  anxious, worried  Thought Process:  Coherent and Goal  Directed  Orientation:  Full (Time, Place, and Person)  Thought Content:  worries, concerns, symptoms  Suicidal Thoughts:  No  Homicidal Thoughts:  No  Memory:  Immediate;   Fair Recent;   Fair Remote;   Fair  Judgement:  Fair  Insight:  Present and Shallow  Psychomotor Activity:  Restlessness  Concentration:  Fair  Recall:  FiservFair  Fund of Knowledge:NA  Language: Fair  Akathisia:  No  Handed:    AIMS (if indicated):     Assets:  Desire for Improvement  Sleep:  Number of Hours: 5   Musculoskeletal: Strength & Muscle Tone: within normal limits Gait & Station: normal Patient leans: N/A  Current Medications: Current Facility-Administered Medications  Medication Dose Route Frequency Provider Last Rate Last Dose  . acetaminophen (TYLENOL) tablet 650 mg  650 mg Oral Q6H PRN Kathy FannyJohn C Withrow, [name]   650 mg at 06/25/13 0241  . B-complex with vitamin C tablet 1 tablet  1 tablet Oral Daily Kathy Haywardakia S Starkes, [name]   1 tablet at 06/25/13 [id number]  . buprenorphine (SUBUTEX) SL tablet 4 mg  4 mg Sublingual TID AC Kathy FeeIrving A Ionna Avis, MD   4 mg at 06/25/13 1611  . diphenhydrAMINE (BENADRYL) capsule 50 mg  50 mg Oral Q8H PRN Kathy FeeIrving A Meleane Selinger, MD   50 mg at 06/25/13 1614  . docusate sodium (COLACE) capsule 100 mg  100 mg Oral BID Kathy FeeIrving A Brittnae Aschenbrenner, MD   100 mg at 06/25/13 0810  . feeding supplement (ENSURE COMPLETE) (ENSURE COMPLETE) liquid 237 mL  237 mL Oral BID BM [name], RD   237 mL at 06/25/13  1325  . folic acid (FOLVITE) tablet 1 mg  1 mg Oral Daily Kathy Fee, MD   1 mg at 06/25/13 0810  . Glycerin (Adult) 2.1 G suppository 1 suppository  1 suppository Rectal Daily PRN Kathy Fee, MD   1 suppository at 06/20/13 1158  . loratadine (CLARITIN) tablet 10 mg  10 mg Oral Daily [name]   10 mg at 06/25/13 0810  . polyethylene glycol (MIRALAX / GLYCOLAX) packet 17 g  17 g Oral BID [name]   17 g at 06/25/13 0810  . prenatal multivitamin tablet 1 tablet  1 tablet Oral Q1200 Kathy Fee, MD   1 tablet at 06/25/13 0810  . sodium phosphate (FLEET) 7-19 GM/118ML enema 1 enema  1 enema Rectal Q3 days PRN [name], CNM   1 enema at 06/24/13 1349  . traZODone (DESYREL) tablet 150 mg  150 mg Oral QHS PRN Kathy Fee, MD   150 mg at 06/24/13 2316  . traZODone (DESYREL) tablet 50 mg  50 mg Oral BID PRN Kathy Fee, MD   50 mg at 06/25/13 0241  . triamcinolone cream (KENALOG) 0.1 %   Topical BID [name]        Lab Results: No results found for this or any previous visit (from the past 48 hour(s)).  Physical Findings: AIMS: Facial and Oral Movements Muscles of Facial Expression: None, normal Lips and Perioral Area: None, normal Jaw: None, normal Tongue: None, normal,Extremity Movements Upper (arms, wrists, hands, fingers): None, normal Lower (legs, knees, ankles, toes): None, normal, Trunk Movements Neck, shoulders, hips: None, normal, Overall Severity Severity of abnormal movements (highest score from questions above): None, normal Incapacitation due to abnormal movements: None, normal Patient's awareness of abnormal movements (rate only patient's report): No Awareness, Dental Status Current problems with teeth and/or dentures?: No Does patient usually wear dentures?: No  CIWA:  CIWA-Ar Total: 2 COWS:  COWS Total Score: 6  Treatment Plan Summary: Daily contact with patient to assess and evaluate symptoms and progress in treatment Medication management  Plan: Supportive approach/coping skills/relapse prevention           Reassess and address the co morbidities           Increase benadryl to 50 mg PRN Medical Decision Making Problem Points:  Review of psycho-social stressors (1) Data Points:  Review of medication regiment & side effects (2)  I certify that inpatient services furnished can reasonably be expected to improve the patient's condition.   [name] A 06/25/2013, 6:34 PM

## 2013-06-25 NOTE — Progress Notes (Signed)
Adult Psychoeducational Group Note  Date:  06/25/2013 Time:  9:29 PM  Group Topic/Focus:  Wrap-Up Group:   The focus of this group is to help patients review their daily goal of treatment and discuss progress on daily workbooks.  Participation Level:  Active  Participation Quality:  Appropriate  Affect:  Appropriate  Cognitive:  Alert and Oriented  Insight: Appropriate  Engagement in Group:  Developing/Improving  Modes of Intervention:  Clarification, Exploration and Socialization  Additional Comments:  Patient stated that she had a great today. Patient stated that she learned from her recovery groups to learn to compliment herself on small accomplishments.  Vidalia Serpas, Randal Bubaerri Lee 06/25/2013, 9:29 PM

## 2013-06-25 NOTE — Progress Notes (Signed)
Psychoeducational Group Note  Date:  06/25/2013 Time:  0900  Group Topic/Focus:  Recovery Goals:   The focus of this group is to identify appropriate goals for recovery and establish a plan to achieve them.  Participation Level: Did Not Attend  Participation Quality:  Not Applicable  Affect:  Not Applicable  Cognitive:  Not Applicable  Insight:  Not Applicable  Engagement in Group: Not Applicable  Nestor RampDUNCAN, Kathy Wamble [facility] Hospital[facility]CCOLLUM 06/25/2013, 11:18 AM

## 2013-06-26 ENCOUNTER — Encounter: Payer: Self-pay | Admitting: Obstetrics & Gynecology

## 2013-06-26 NOTE — [facility] Group Notes (Signed)
Adult Psychoeducational Group Note  Date:  06/26/2013 Time:  9:36 PM  Group Topic/Focus:  AA Meeting  Participation Level:  Active  Participation Quality:  Appropriate  Affect:  Appropriate  Cognitive:  Appropriate  Insight: Good  Engagement in Group:  Engaged  Modes of Intervention:  Discussion and Education  Additional Comments:  [name] was very attentive and asked questions.  She became antsy at the end of group because she wanted to watch [place].  [name], [name] A 06/26/2013, 9:36 PM

## 2013-06-26 NOTE — Tx Team (Signed)
Interdisciplinary Treatment Plan Update (Adult)  Date: 06/26/2013   Time Reviewed: 10:29 AM  Progress in Treatment:  Attending groups: Intermittently  Participating in groups: Minimally Taking medication as prescribed: Yes  Tolerating medication: Yes  Family/Significant othe contact made: No. SPE not required for this pt.   Patient understands diagnosis: Yes, AEB seeking treatment for cocaine/heroin detox and mood stabilization.  Discussing patient identified problems/goals with staff: Yes  Medical problems stabilized or resolved: Yes  Denies suicidal/homicidal ideation: Yes during admission/self report.  Patient has not harmed self or Others: Yes  New problem(s) identified:  Discharge Plan or Barriers: Pt reports that she wants to return home until her admission into [facility]. She is aware that she can go home with a max of one week Suboxine from Dr. Dub MikesLugo. [facility]essment for services). Admission for [facility] will be determined "later next week" according to Casandra (intake coordinator) for [facility].   Additional comments:n/a Reason for Continuation of Hospitalization: Suboxine-/maintainence  Estimated length of stay: 1-2 days (likely d/c Thursday)  For review of initial/current patient goals, please see plan of care.  Attendees:  Patient:    Family:    Physician: Dr. Geoffery LyonsIrving Lugo MD 06/26/2013 10:29 AM   Nursing: 06/26/2013 10:29 AM   Clinical Social Worker The Sherwin-WilliamsHeather Smart, LCSWA  06/26/2013 10:29 AM   Other: Chandra BatchAggie N. PA 06/26/2013 10:29 AM   Other: Darden DatesJennifer C. Nurse CM 06/26/2013 10:29 AM   Other:    Other:    Scribe for Treatment Team:  The Sherwin-WilliamsHeather Smart LCSWA 06/26/2013 10:29 AM

## 2013-06-26 NOTE — [facility] Group Notes (Signed)
[facility]   06/26/2013 9:46 AM  Participation Quality:  Appropriate   Mood/Affect:  Appropriate  Depression Rating:  1  Anxiety Rating:  7  Thoughts of Suicide:  No Will you contract for safety?   NA  Current AVH:  No  Plan for Discharge/Comments:  Pt states that she wants to return home until her admission into [facility]. Pt will find out "at the end of next week" when her admission date is. Pt aware that she will only be sent home with one week of suboxine and plans to [facility] for [facility] management/assessment for services if needed. Pt plans to return home to her boyfriends' home.   Transportation Means: family member   Supports: some family supports/boyfriend   Smart, OncologistHeather LCSWA

## 2013-06-26 NOTE — Progress Notes (Signed)
Patient ID: Kathy ParsonsSarah D Junker, female   DOB: Apr 19, 1986, 28 y.o.   MRN: [id number]  D:  Pt was brighter today and less anxious. Initially pt was upset that writer had taken the baby powder out of her room, however pt was able to calm down rather quickly stating, "I' not gonna let this bother me". Writer reminded pt that she was informed earlier that powder wasn't allowed on the unit. Pt smiled and acknowledged. When asked her plans for discharge pt stated that she plans to return home to her boyfriend and continue taking her meds until her bed is available at rehab.   A:  Support and encouragement was offered. 15 min checks continued for safety.  R: Pt remains safe.
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
## 2013-06-26 NOTE — Progress Notes (Signed)
Patient ID: [name], female   DOB: 1985-12-28, 28 y.o.   MRN: [id number]  Pt laying in bed resting with eyes closed. Respirations even and unlabored. No distress noted.

## 2013-06-26 NOTE — [facility] Group Notes (Signed)
[facility] LCSW Group Therapy  06/26/2013 2:49 PM  Type of Therapy:  Group Therapy  Participation Level:  Active  Participation Quality:  Attentive  Affect:  Appropriate  Cognitive:  Alert and Oriented  Insight:  Improving  Engagement in Therapy:  Engaged  Modes of Intervention:  Confrontation, Discussion, Education, Exploration, Problem-solving, Rapport Building, Socialization and Support  Summary of Progress/Problems: Emotion Regulation: This group focused on both positive and negative emotion identification and allowed group members to process ways to identify feelings, regulate negative emotions, and find healthy ways to manage internal/external emotions. Group members were asked to reflect on a time when their reaction to an emotion led to a negative outcome and explored how alternative responses using emotion regulation would have benefited them. Group members were also asked to discuss a time when emotion regulation was utilized when a negative emotion was experienced. [name] was attentive and engaged throughout today's therapy group. She provided emotional support to other group members and shared that she had problems with anger in her past. [name] stated that she is learning to breath, pray, and utilize other coping skills that she has learned over her course of treatment in order to regulate her negative emotions and remain in balance. [name] shared that she is hopeful [facility] and feels confident that this is the change needed to remain in recovery and take care of her child.    Smart, Fifi Schindler LCSWA  06/26/2013, 2:49 PM

## 2013-06-26 NOTE — Progress Notes (Signed)
Patient ID: Kathy ParsonsSarah D [name], female   DOB: Sep 11, 1985, 28 y.o.   MRN: [id number] She has  Been up and to groups interacting with peers and staff. Self inventory: Depression 1, hopelessness 1 denies withdrawal symptoms and SI thoughts. She has requested and received prn medication for headache 10:27 and benadryl at 12:01 that were effective. She has been calmer  And behavior has been appropriate.

## 2013-06-27 MED ORDER — LORATADINE 10 MG PO TABS: 10.0000 mg | ORAL_TABLET | Freq: Every day | ORAL | Status: DC

## 2013-06-27 MED ORDER — BUPRENORPHINE HCL 2 MG SL SUBL: 4.0000 mg | SUBLINGUAL_TABLET | Freq: Three times a day (TID) | SUBLINGUAL | Status: DC

## 2013-06-27 MED ORDER — TRIAMCINOLONE ACETONIDE 0.1 % EX CREA: TOPICAL_CREAM | Freq: Two times a day (BID) | CUTANEOUS | Status: DC

## 2013-06-27 MED ORDER — TRAZODONE HCL 150 MG PO TABS: 150.0000 mg | ORAL_TABLET | Freq: Every evening | ORAL | Status: DC | PRN

## 2013-06-27 MED ORDER — DSS 100 MG PO CAPS: 100.0000 mg | ORAL_CAPSULE | Freq: Two times a day (BID) | ORAL | Status: DC

## 2013-06-27 MED ORDER — FOLIC ACID 1 MG PO TABS: 1.0000 mg | ORAL_TABLET | Freq: Every day | ORAL | Status: DC

## 2013-06-27 MED ORDER — PRENATAL MULTIVITAMIN CH: 1.0000 | ORAL_TABLET | Freq: Every day | ORAL | Status: DC

## 2013-06-27 NOTE — [facility] Suicide Risk Assessment (Signed)
Suicide Risk Assessment  Discharge Assessment     Demographic Factors: N/A   Total Time spent with patient: 45 minutes  Psychiatric Specialty Exam:     Blood pressure 101/68, pulse 89, temperature 97.7 F (36.5 C), temperature source Oral, resp. rate 18, height 5' 3.5" (1.613 m), weight 68.947 kg (152 lb), last menstrual period 02/28/2013, SpO2 97.00%.Body mass index is 26.5 kg/(m^2).  General Appearance: Fairly Groomed  Patent attorneyye Contact::  Fair  Speech:  Clear and Coherent  Volume:  Normal  Mood:  Euthymic  Affect:  Appropriate  Thought Process:  Coherent and Goal Directed  Orientation:  Full (Time, Place, and Person)  Thought Content:  worries, concerns, relapse prevention plan  Suicidal Thoughts:  No  Homicidal Thoughts:  No  Memory:  Immediate;   Fair Recent;   Fair Remote;   Fair  Judgement:  Fair  Insight:  Present  Psychomotor Activity:  Restlessness  Concentration:  Fair  Recall:  [employer]  Fund of Knowledge:NA  Language: Fair  Akathisia:  No  Handed:    AIMS (if indicated):     Assets:  Desire for Improvement Social Support  Sleep:  Number of Hours: 5.75    Musculoskeletal: Strength & Muscle Tone: within normal limits Gait & Station: normal Patient leans: N/A   Mental Status Per Nursing Assessment::   On Admission:  NA  Current Mental Status by Physician: In full contact with reality. There are no suicidal ideas, plans or intent. There are no active S/S of withdrawal. She is committed to abstinence, work the program have her baby   Loss Factors: NA  Historical Factors: NA  Risk Reduction Factors:   Sense of responsibility to family and Positive social support  Continued Clinical Symptoms:  Depression:   Comorbid alcohol abuse/dependence Alcohol/Substance Abuse/Dependencies More than one psychiatric diagnosis  Cognitive Features That Contribute To Risk:  Closed-mindedness Polarized thinking Thought constriction (tunnel vision)    Suicide Risk:   Minimal: No identifiable suicidal ideation.  Patients presenting with no risk factors but with morbid ruminations; may be classified as minimal risk based on the severity of the depressive symptoms  Discharge Diagnoses:   AXIS I:  Opioid Dependence, PTSD, MDD AXIS II:  Deferred AXIS III:   Past Medical History  Diagnosis Date  . STD (female)     hx of chlamydia and gonorrhea  . History of PID   . Endometriosis   . Glaucoma   . Ovarian cyst   . Bartholin cyst   . Headache(784.0)   . Infection     UTI  . Vaginal Pap smear, abnormal     has not followed up   AXIS IV:  other psychosocial or environmental problems AXIS V:  61-70 mild symptoms  Plan Of Care/Follow-up recommendations:  Activity:  as tolerated Diet:  as per the nutritionist Follow up outpatient residential treatment center Is patient on multiple antipsychotic therapies at discharge:  No   Has Patient had three or more failed trials of antipsychotic monotherapy by history:  No  Recommended Plan for Multiple Antipsychotic Therapies: NA    Steadman Prosperi A 06/27/2013, 11:30 AM

## 2013-06-27 NOTE — Progress Notes (Signed)
The focus of this group is to educate the patient on the purpose and policies of crisis stabilization and provide a format to answer questions about their admission.  The group details unit policies and expectations of patients while admitted.   Pt did not attend this group. 
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
## 2013-06-27 NOTE — Progress Notes (Signed)
Patient ID: Kathy ParsonsSarah D [name], female   DOB: 09-10-1985, 28 y.o.   MRN: [id number] She has been up and to groups interacting with peers and staff has been loud at times. She has had no c/o discomfort today. She has been excited about leaving today. Stated that she had to take core of herself to take care of the baby. Self inventory: depression and hopelessness at 1. Denies withdrawal symptoms and SI thoughts.

## 2013-06-27 NOTE — Clinical Social Work Note (Signed)
Pt's OBGYN doctor contacted CSW to request that hospital keep pt until March 17th, her admission date [facility]. CSW explained that pt is stable, fully detoxed, and safe to d/c and further explained that pt has right to d/c home if she does not want to go to another facility to wait for bed at [facility]. CSW and Dr. Dub MikesLugo dicussed this. Pt stated that she preferred to return home and stated that she felt that she would not relapse being that she had enough suboxine to last her until her admission.    The Sherwin-WilliamsHeather Smart, LCSWA  06/27/2013 4:30 PM

## 2013-06-27 NOTE — Progress Notes (Signed)
[facility] Adult Case Management Discharge Plan :  Will you be returning to the same living situation after discharge: Yes,  home with bf until admission into [facility] At discharge, do you have transportation home?:Yes,  friend Do you have the ability to pay for your medications:Yes,  Medicaid  Release of information consent forms completed and submitted to Medical Records by CSW.  Patient to Follow up at: Follow-up Information   Follow up with [facility] On 07/09/2013. (Arrive at 1:00PM on this date for admission into program. )    Contact information:   [address] [place], [place] [address] Phone: [phone] Fax: [phone]      [facility] On 07/08/2013. (Walk in appt. Arrive no later than 10:30AM for hospital follow-up appt.)    Contact information:   [address] [place], [place] [address] Phone: [phone] Fax: [phone]      Patient denies SI/HI:   Yes,  during admission, group, and self report.     Safety Planning and Suicide Prevention discussed:  Yes,  SPE not required for this pt. CSW provided SPI pamphlet to pt and she was encouraged to share information with her support network, ask questions, and talk about any concerns relating to SPE.  Smart, Yancy Knoble LCSWA  06/27/2013, 10:16 AM

## 2013-06-27 NOTE — Progress Notes (Signed)
Patient ID: Kathy ParsonsSarah D Tokunaga, female   DOB: 07/17/1985, 28 y.o.   MRN: [id number] She has been discharged home and was picked up by her boyfriend. She voiced understanding of discharge instruction about her medications and of follow up appointments. She denies thoughts of SI and all belongings were taken home with her.

## 2013-07-02 NOTE — Progress Notes (Signed)
Patient Discharge Instructions:  After Visit Summary (AVS):   Faxed to:  07/02/13 Discharge Summary Note:   Faxed to:  07/02/13 Psychiatric Admission Assessment Note:   Faxed to:  07/02/13 Suicide Risk Assessment - Discharge Assessment:   Faxed to:  07/02/13 Faxed/Sent to the Next Level Care provider:  07/02/13 Faxed to [facility] @ [phone] Faxed to [facility] @ [phone]  Jerelene ReddenSheena E Montfort, 07/02/2013, 3:33 PM

## 2013-07-08 ENCOUNTER — Inpatient Hospital Stay (HOSPITAL_COMMUNITY)
Admission: AD | Admit: 2013-07-08 | Discharge: 2013-07-08 | Disposition: A | Discharge status: Home / Self Care | Payer: Medicaid Other | Source: Ambulatory Visit | Attending: Obstetrics | Admitting: Obstetrics

## 2013-07-08 ENCOUNTER — Encounter (HOSPITAL_COMMUNITY): Payer: Self-pay | Admitting: *Deleted

## 2013-07-08 DIAGNOSIS — O26899 Other specified pregnancy related conditions, unspecified trimester: Secondary | ICD-10-CM

## 2013-07-08 DIAGNOSIS — O210 Mild hyperemesis gravidarum: Secondary | ICD-10-CM | POA: Insufficient documentation

## 2013-07-08 DIAGNOSIS — F5089 Other specified eating disorder: Secondary | ICD-10-CM | POA: Insufficient documentation

## 2013-07-08 DIAGNOSIS — O99340 Other mental disorders complicating pregnancy, unspecified trimester: Secondary | ICD-10-CM | POA: Insufficient documentation

## 2013-07-08 DIAGNOSIS — O99330 Smoking (tobacco) complicating pregnancy, unspecified trimester: Secondary | ICD-10-CM | POA: Insufficient documentation

## 2013-07-08 DIAGNOSIS — O219 Vomiting of pregnancy, unspecified: Secondary | ICD-10-CM | POA: Diagnosis present

## 2013-07-08 DIAGNOSIS — R109 Unspecified abdominal pain: Secondary | ICD-10-CM | POA: Insufficient documentation

## 2013-07-08 DIAGNOSIS — F1910 Other psychoactive substance abuse, uncomplicated: Secondary | ICD-10-CM | POA: Insufficient documentation

## 2013-07-08 DIAGNOSIS — F1920 Other psychoactive substance dependence, uncomplicated: Secondary | ICD-10-CM

## 2013-07-08 DIAGNOSIS — F1120 Opioid dependence, uncomplicated: Secondary | ICD-10-CM

## 2013-07-08 LAB — CBC
HEMATOCRIT: 36.4 % (ref 36.0–46.0)
Hemoglobin: 13.2 g/dL (ref 12.0–15.0)
MCH: 32 pg (ref 26.0–34.0)
MCHC: 36.3 g/dL — ABNORMAL HIGH (ref 30.0–36.0)
MCV: 88.3 fL (ref 78.0–100.0)
PLATELETS: 306 10*3/uL (ref 150–400)
RBC: 4.12 MIL/uL (ref 3.87–5.11)
RDW: 13.5 % (ref 11.5–15.5)
WBC: 12.4 10*3/uL — ABNORMAL HIGH (ref 4.0–10.5)

## 2013-07-08 LAB — RAPID URINE DRUG SCREEN, HOSP PERFORMED
Amphetamines: NOT DETECTED
BARBITURATES: NOT DETECTED
BENZODIAZEPINES: NOT DETECTED
Cocaine: NOT DETECTED
OPIATES: NOT DETECTED
Tetrahydrocannabinol: POSITIVE — AB

## 2013-07-08 LAB — URINALYSIS, ROUTINE W REFLEX MICROSCOPIC
BILIRUBIN URINE: NEGATIVE
KETONES UR: NEGATIVE mg/dL
LEUKOCYTES UA: NEGATIVE
Nitrite: NEGATIVE
PH: 6 (ref 5.0–8.0)
PROTEIN: NEGATIVE mg/dL
SPECIFIC GRAVITY, URINE: 1.02 (ref 1.005–1.030)
UROBILINOGEN UA: 0.2 mg/dL (ref 0.0–1.0)

## 2013-07-08 LAB — URINE MICROSCOPIC-ADD ON

## 2013-07-08 MED ORDER — FAMOTIDINE IN NACL 20-0.9 MG/50ML-% IV SOLN
20.0000 mg | INTRAVENOUS | Status: AC
  Administered 2013-07-08: 20 mg via INTRAVENOUS
  Filled 2013-07-08: qty 50

## 2013-07-08 MED ORDER — PROMETHAZINE HCL 25 MG/ML IJ SOLN
25.0000 mg | INTRAMUSCULAR | Status: AC
  Administered 2013-07-08: 25 mg via INTRAVENOUS
  Filled 2013-07-08: qty 1

## 2013-07-08 MED ORDER — PROMETHAZINE HCL 25 MG PO TABS: 12.5000 mg | ORAL_TABLET | Freq: Four times a day (QID) | ORAL | Status: DC | PRN

## 2013-07-08 MED ORDER — DEXTROSE 5 % IN LACTATED RINGERS IV BOLUS
1000.0000 mL | INTRAVENOUS | Status: AC
  Administered 2013-07-08: 1000 mL via INTRAVENOUS

## 2013-07-08 MED ORDER — ONDANSETRON HCL 4 MG/2ML IJ SOLN
4.0000 mg | INTRAMUSCULAR | Status: AC
  Administered 2013-07-08: 4 mg via INTRAVENOUS
  Filled 2013-07-08: qty 2

## 2013-07-08 NOTE — MAU Provider Note (Signed)
Chief Complaint: Nausea and Emesis During Pregnancy   First Provider Initiated Contact with Patient 07/08/13 0944     SUBJECTIVE HPI: [name] is a 28 y.o. [id number] at [redacted]w[redacted]d by LMP who presents to maternity admissions via EMS reporting severe abdominal pain and n/v. Upon arrival she is unable to answer questions and reports "I am hurting so I cannot talk".  She does report the pain started first, then n/v.  She refused fetal heart tones by RN until the FOB arrived, then allowed RN to assess fetal status.  She is writhing in bed, gagging loudly, and vomiting bile while assessment performed.  She reports good fetal movement, denies LOF, vaginal bleeding, vaginal itching/burning, urinary symptoms, h/a, dizziness, or fever/chills.    After IV fluids and phenergan administered, pt calm, answering questions appropriately, denies pain.  She requests her daily dose of Subutex here in MAU but when told plan was to D/C her to home she reported she could continue her home meds.  She reports cravings for cornstarch and chalk in this pregnancy.  She has been drinking cornstarch out of the bag with a straw but has not eaten any chalk.   She also reports she plans to move to a 12 month group home tomorrow for substance abuse and is not sure if her prenatal care will continue with [facility] or be provided on location at the group home.   Past Medical History  Diagnosis Date  . STD (female)     hx of chlamydia and gonorrhea  . History of PID   . Endometriosis   . Glaucoma   . Ovarian cyst   . Bartholin cyst   . Headache(784.0)   . Infection     UTI  . Vaginal Pap smear, abnormal     has not followed up   Past Surgical History  Procedure Laterality Date  . Fracture surgery      left leg  . Dilation and curettage of uterus    . Laparoscopy     History   Social History  . Marital Status: Single    Spouse Name: N/A    Number of Children: N/A  . Years of Education: N/A   Occupational History   . Not on file.   Social History Main Topics  . Smoking status: Current Every Day Smoker -- 0.25 packs/day for 7 years    Types: Cigarettes  . Smokeless tobacco: Never Used  . Alcohol Use: No     Comment: Denies ETOH use  . Drug Use: Yes    Special: Marijuana, Cocaine, Heroin     Comment: .denies use  . Sexual Activity: Yes    Birth Control/ Protection: Condom, None   Other Topics Concern  . Not on file   Social History Narrative  . No narrative on file   No current facility-administered medications on file prior to encounter.   Current Outpatient Prescriptions on File Prior to Encounter  Medication Sig Dispense Refill  . folic acid (FOLVITE) 1 MG tablet Take 1 tablet (1 mg total) by mouth daily. For low folate  30 tablet  3  . Prenatal Vit-Fe Fumarate-FA (PRENATAL MULTIVITAMIN) TABS tablet Take 1 tablet by mouth daily at 12 noon. For pregnancy  30 tablet  0  . traZODone (DESYREL) 150 MG tablet Take 1 tablet (150 mg total) by mouth at bedtime as needed for sleep.  30 tablet  0   Allergies  Allergen Reactions  . Acyclovir And Related Swelling  .  Darvocet [Propoxyphene N-Acetaminophen] Hives  . Doxycycline Swelling  . Flexeril [Cyclobenzaprine] Nausea And Vomiting  . Metoclopramide Hives  . Naproxen Hives    Pt is able to take ibuprofen without problems  . Latex Rash  . Tramadol Rash    ROS: Pertinent items in HPI  OBJECTIVE Blood pressure 112/65, pulse 63, resp. rate 20, last menstrual period 02/28/2013. GENERAL: Well-developed, well-nourished female in no acute distress.  HEENT: Normocephalic HEART: normal rate RESP: normal effort ABDOMEN: Soft, non-tender EXTREMITIES: Nontender, no edema NEURO: Alert and oriented  Cervix closed/long/high, posterior  FHT 147 by doppler  LAB RESULTS Results for orders placed during the hospital encounter of 07/08/13 (from the past 24 hour(s))  URINALYSIS, ROUTINE W REFLEX MICROSCOPIC     Status: Abnormal   Collection Time     07/08/13 10:35 AM      Result Value Ref Range   Color, Urine YELLOW  YELLOW   APPearance CLEAR  CLEAR   Specific Gravity, Urine 1.020  1.005 - 1.030   pH 6.0  5.0 - 8.0   Glucose, UA >1000 (*) NEGATIVE mg/dL   Hgb urine dipstick TRACE (*) NEGATIVE   Bilirubin Urine NEGATIVE  NEGATIVE   Ketones, ur NEGATIVE  NEGATIVE mg/dL   Protein, ur NEGATIVE  NEGATIVE mg/dL   Urobilinogen, UA 0.2  0.0 - 1.0 mg/dL   Nitrite NEGATIVE  NEGATIVE   Leukocytes, UA NEGATIVE  NEGATIVE  URINE MICROSCOPIC-ADD ON     Status: Abnormal   Collection Time    07/08/13 10:35 AM      Result Value Ref Range   Squamous Epithelial / LPF FEW (*) RARE   RBC / HPF 0-2  <3 RBC/hpf   Bacteria, UA RARE  RARE     ASSESSMENT 1. Abdominal pain complicating pregnancy   2. Nausea/vomiting in pregnancy   3. Polysubstance dependence including opioid type drug, continuous use   4. Pica     PLAN Discharge home Phenergan 12.5-25 mg Rx to pharmacy F/U with [facility] Pt and s/o had questions about gestational diabetes and iron in foods.  Teaching done verbally, and printed materials given.  Discussed risks of eating Pica cravings, even cornstarch and recommend she notify her providers if she craves any non-food items before acting on her cravings. Return to MAU as needed    Medication List         buprenorphine 8 MG Subl SL tablet  Commonly known as:  SUBUTEX  Place 4-8 mg under the tongue 2 (two) times daily. 1 tablet (8 mg in the morning); 1/2 tablet (4 mg) in the afternoon     DSS 100 MG Caps  Take 100 mg by mouth 2 (two) times daily as needed (constipation).     folic acid 1 MG tablet  Commonly known as:  FOLVITE  Take 1 tablet (1 mg total) by mouth daily. For low folate     loratadine 10 MG tablet  Commonly known as:  CLARITIN  Take 10 mg by mouth daily as needed for allergies.     prenatal multivitamin Tabs tablet  Take 1 tablet by mouth daily at 12 noon. For pregnancy     promethazine 25 MG tablet   Commonly known as:  PHENERGAN  Take 0.5-1 tablets (12.5-25 mg total) by mouth every 6 (six) hours as needed for nausea or vomiting.     traZODone 150 MG tablet  Commonly known as:  DESYREL  Take 1 tablet (150 mg total) by mouth at bedtime as  needed for sleep.     triamcinolone cream 0.1 %  Commonly known as:  KENALOG  Apply 1 application topically 2 (two) times daily as needed. For skin rash       Follow-up Information   Follow up with [facility]. (Return to MAU , As needed)    Contact information:   [address] [place] [address] [phone]      Sharen Counter Certified Nurse-Midwife 07/08/2013  11:44 AM

## 2013-07-08 NOTE — Discharge Instructions (Signed)
Nausea/Vomiting in Pregnancy Morning sickness is when you feel sick to your stomach (nauseous) during pregnancy. This nauseous feeling may or may not come with vomiting. It often occurs in the morning but can be a problem any time of day. Morning sickness is most common during the first trimester, but it may continue throughout pregnancy. While morning sickness is unpleasant, it is usually harmless unless you develop severe and continual vomiting (hyperemesis gravidarum). This condition requires more intense treatment.  CAUSES  The cause of morning sickness is not completely known but seems to be related to normal hormonal changes that occur in pregnancy. RISK FACTORS You are at greater risk if you:  Experienced nausea or vomiting before your pregnancy.  Had morning sickness during a previous pregnancy.  Are pregnant with more than one baby, such as twins. TREATMENT  Do not use any medicines (prescription, over-the-counter, or herbal) for morning sickness without first talking to your health care provider. Your health care provider may prescribe or recommend:  Vitamin B6 supplements.  Anti-nausea medicines.  The herbal medicine ginger. HOME CARE INSTRUCTIONS   Only take over-the-counter or prescription medicines as directed by your health care provider.  Taking multivitamins before getting pregnant can prevent or decrease the severity of morning sickness in most women.   Eat a piece of dry toast or unsalted crackers before getting out of bed in the morning.   Eat five or six small meals a day.   Eat dry and bland foods (rice, baked potato). Foods high in carbohydrates are often helpful.  Do not drink liquids with your meals. Drink liquids between meals.   Avoid greasy, fatty, and spicy foods.   Get someone to cook for you if the smell of any food causes nausea and vomiting.   If you feel nauseous after taking prenatal vitamins, take the vitamins at night or with a  snack.  Snack on protein foods (nuts, yogurt, cheese) between meals if you are hungry.   Eat unsweetened gelatins for desserts.   Wearing an acupressure wristband (worn for sea sickness) may be helpful.   Acupuncture may be helpful.   Do not smoke.   Get a humidifier to keep the air in your house free of odors.   Get plenty of fresh air. SEEK MEDICAL CARE IF:   Your home remedies are not working, and you need medicine.  You feel dizzy or lightheaded.  You are losing weight. SEEK IMMEDIATE MEDICAL CARE IF:   You have persistent and uncontrolled nausea and vomiting.  You pass out (faint). Document Released: 06/02/2006 Document Revised: 12/12/2012 Document Reviewed: 09/26/2012 [facility] Patient Information 2014 [place] [place].

## 2013-07-08 NOTE — MAU Note (Signed)
Pt presents with complaints of nausea and vomiting since yesterday. Denies any vagina bleeding or LOF.

## 2013-08-06 ENCOUNTER — Emergency Department (HOSPITAL_COMMUNITY)
Admission: EM | Admit: 2013-08-06 | Discharge: 2013-08-07 | Disposition: A | Discharge status: Other Acute Inpatient Hospital | Payer: Medicaid Other | Attending: Emergency Medicine | Admitting: Emergency Medicine

## 2013-08-06 ENCOUNTER — Encounter (HOSPITAL_COMMUNITY): Payer: Self-pay | Admitting: Emergency Medicine

## 2013-08-06 ENCOUNTER — Emergency Department (HOSPITAL_COMMUNITY): Payer: Medicaid Other

## 2013-08-06 DIAGNOSIS — Z008 Encounter for other general examination: Secondary | ICD-10-CM | POA: Insufficient documentation

## 2013-08-06 DIAGNOSIS — Z79899 Other long term (current) drug therapy: Secondary | ICD-10-CM | POA: Insufficient documentation

## 2013-08-06 DIAGNOSIS — F1420 Cocaine dependence, uncomplicated: Secondary | ICD-10-CM | POA: Insufficient documentation

## 2013-08-06 DIAGNOSIS — G479 Sleep disorder, unspecified: Secondary | ICD-10-CM | POA: Insufficient documentation

## 2013-08-06 DIAGNOSIS — F1193 Opioid use, unspecified with withdrawal: Secondary | ICD-10-CM

## 2013-08-06 DIAGNOSIS — O99330 Smoking (tobacco) complicating pregnancy, unspecified trimester: Secondary | ICD-10-CM | POA: Insufficient documentation

## 2013-08-06 DIAGNOSIS — F19939 Other psychoactive substance use, unspecified with withdrawal, unspecified: Secondary | ICD-10-CM | POA: Insufficient documentation

## 2013-08-06 DIAGNOSIS — O99340 Other mental disorders complicating pregnancy, unspecified trimester: Secondary | ICD-10-CM | POA: Insufficient documentation

## 2013-08-06 DIAGNOSIS — F1120 Opioid dependence, uncomplicated: Secondary | ICD-10-CM | POA: Insufficient documentation

## 2013-08-06 DIAGNOSIS — T7421XA Adult sexual abuse, confirmed, initial encounter: Secondary | ICD-10-CM | POA: Insufficient documentation

## 2013-08-06 DIAGNOSIS — O9989 Other specified diseases and conditions complicating pregnancy, childbirth and the puerperium: Secondary | ICD-10-CM | POA: Insufficient documentation

## 2013-08-06 DIAGNOSIS — Z8669 Personal history of other diseases of the nervous system and sense organs: Secondary | ICD-10-CM | POA: Insufficient documentation

## 2013-08-06 DIAGNOSIS — Z91040 Latex allergy status: Secondary | ICD-10-CM | POA: Insufficient documentation

## 2013-08-06 DIAGNOSIS — Z8742 Personal history of other diseases of the female genital tract: Secondary | ICD-10-CM | POA: Insufficient documentation

## 2013-08-06 DIAGNOSIS — R064 Hyperventilation: Secondary | ICD-10-CM | POA: Insufficient documentation

## 2013-08-06 DIAGNOSIS — Z87440 Personal history of urinary (tract) infections: Secondary | ICD-10-CM | POA: Insufficient documentation

## 2013-08-06 DIAGNOSIS — Z8619 Personal history of other infectious and parasitic diseases: Secondary | ICD-10-CM | POA: Insufficient documentation

## 2013-08-06 DIAGNOSIS — F411 Generalized anxiety disorder: Secondary | ICD-10-CM | POA: Insufficient documentation

## 2013-08-06 DIAGNOSIS — F909 Attention-deficit hyperactivity disorder, unspecified type: Secondary | ICD-10-CM | POA: Insufficient documentation

## 2013-08-06 DIAGNOSIS — Z3490 Encounter for supervision of normal pregnancy, unspecified, unspecified trimester: Secondary | ICD-10-CM

## 2013-08-06 DIAGNOSIS — S3981XA Other specified injuries of abdomen, initial encounter: Secondary | ICD-10-CM | POA: Insufficient documentation

## 2013-08-06 DIAGNOSIS — F1220 Cannabis dependence, uncomplicated: Secondary | ICD-10-CM | POA: Insufficient documentation

## 2013-08-06 DIAGNOSIS — F1123 Opioid dependence with withdrawal: Secondary | ICD-10-CM

## 2013-08-06 LAB — URINALYSIS, ROUTINE W REFLEX MICROSCOPIC
BILIRUBIN URINE: NEGATIVE
Glucose, UA: NEGATIVE mg/dL
HGB URINE DIPSTICK: NEGATIVE
KETONES UR: NEGATIVE mg/dL
NITRITE: NEGATIVE
PH: 5.5 (ref 5.0–8.0)
Protein, ur: NEGATIVE mg/dL
Specific Gravity, Urine: 1.019 (ref 1.005–1.030)
Urobilinogen, UA: 0.2 mg/dL (ref 0.0–1.0)

## 2013-08-06 LAB — CBC WITH DIFFERENTIAL/PLATELET
BASOS ABS: 0 10*3/uL (ref 0.0–0.1)
Basophils Relative: 0 % (ref 0–1)
Eosinophils Absolute: 0.1 10*3/uL (ref 0.0–0.7)
Eosinophils Relative: 1 % (ref 0–5)
HEMATOCRIT: 31.3 % — AB (ref 36.0–46.0)
Hemoglobin: 11.4 g/dL — ABNORMAL LOW (ref 12.0–15.0)
LYMPHS PCT: 21 % (ref 12–46)
Lymphs Abs: 2.2 10*3/uL (ref 0.7–4.0)
MCH: 31.4 pg (ref 26.0–34.0)
MCHC: 36.4 g/dL — ABNORMAL HIGH (ref 30.0–36.0)
MCV: 86.2 fL (ref 78.0–100.0)
MONO ABS: 1.2 10*3/uL — AB (ref 0.1–1.0)
Monocytes Relative: 11 % (ref 3–12)
NEUTROS PCT: 67 % (ref 43–77)
Neutro Abs: 7.1 10*3/uL (ref 1.7–7.7)
Platelets: 328 10*3/uL (ref 150–400)
RBC: 3.63 MIL/uL — ABNORMAL LOW (ref 3.87–5.11)
RDW: 13.2 % (ref 11.5–15.5)
WBC: 10.6 10*3/uL — AB (ref 4.0–10.5)

## 2013-08-06 LAB — RAPID URINE DRUG SCREEN, HOSP PERFORMED
Amphetamines: NOT DETECTED
BARBITURATES: NOT DETECTED
Benzodiazepines: NOT DETECTED
COCAINE: POSITIVE — AB
Opiates: POSITIVE — AB
Tetrahydrocannabinol: POSITIVE — AB

## 2013-08-06 LAB — COMPREHENSIVE METABOLIC PANEL
ALBUMIN: 3.1 g/dL — AB (ref 3.5–5.2)
ALK PHOS: 40 U/L (ref 39–117)
ALT: 12 U/L (ref 0–35)
AST: 14 U/L (ref 0–37)
BUN: 12 mg/dL (ref 6–23)
CO2: 19 mEq/L (ref 19–32)
Calcium: 9.1 mg/dL (ref 8.4–10.5)
Chloride: 103 mEq/L (ref 96–112)
Creatinine, Ser: 0.7 mg/dL (ref 0.50–1.10)
GFR calc Af Amer: >90 mL/min (ref >90)
GFR calc non Af Amer: >90 mL/min (ref >90)
Glucose, Bld: 116 mg/dL — ABNORMAL HIGH (ref 70–99)
POTASSIUM: 3.7 meq/L (ref 3.7–5.3)
Sodium: 136 mEq/L — ABNORMAL LOW (ref 137–147)
TOTAL PROTEIN: 6.8 g/dL (ref 6.0–8.3)

## 2013-08-06 LAB — ABO/RH: ABO/RH(D): B POS

## 2013-08-06 LAB — URINE MICROSCOPIC-ADD ON

## 2013-08-06 LAB — ETHANOL

## 2013-08-06 LAB — ACETAMINOPHEN LEVEL: Acetaminophen (Tylenol), Serum: <15 ug/mL (ref 10–30)

## 2013-08-06 LAB — SALICYLATE LEVEL: Salicylate Lvl: <2 mg/dL — ABNORMAL LOW (ref 2.8–20.0)

## 2013-08-06 MED ORDER — BUPRENORPHINE HCL 8 MG SL SUBL
4.0000 mg | SUBLINGUAL_TABLET | Freq: Once | SUBLINGUAL | Status: AC
  Administered 2013-08-06: 4 mg via SUBLINGUAL
  Filled 2013-08-06: qty 0.5

## 2013-08-06 MED ORDER — DIPHENHYDRAMINE HCL 25 MG PO CAPS
25.0000 mg | ORAL_CAPSULE | Freq: Once | ORAL | Status: AC
  Administered 2013-08-06: 25 mg via ORAL
  Filled 2013-08-06: qty 1

## 2013-08-06 MED ORDER — BUPRENORPHINE HCL 8 MG SL SUBL: 4.0000 mg | SUBLINGUAL_TABLET | Freq: Once | SUBLINGUAL | Status: DC

## 2013-08-06 MED ORDER — PRENATAL MULTIVITAMIN CH
1.0000 | ORAL_TABLET | Freq: Every day | ORAL | Status: DC
  Filled 2013-08-06: qty 1

## 2013-08-06 MED ORDER — PROMETHAZINE HCL 25 MG PO TABS: 12.5000 mg | ORAL_TABLET | Freq: Four times a day (QID) | ORAL | Status: DC | PRN

## 2013-08-06 MED ORDER — BUPRENORPHINE HCL 8 MG SL SUBL: 4.0000 mg | SUBLINGUAL_TABLET | Freq: Three times a day (TID) | SUBLINGUAL | Status: DC

## 2013-08-06 NOTE — ED Notes (Signed)
Pt called x 1 no answer

## 2013-08-06 NOTE — ED Notes (Signed)
Pt said her pain is getting worse. Nurse notified.
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
## 2013-08-06 NOTE — ED Notes (Signed)
Pt called x3 no answer. Not found in waiting room.

## 2013-08-06 NOTE — ED Notes (Signed)
Pt said she did not want to go to the psych ED if she could not take her hair scarf that is currently in her hair.

## 2013-08-06 NOTE — ED Notes (Signed)
MD aware of patient's pain level. Will order Subutex. Cleared patient eat. No pain meds ordered at this time.
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
## 2013-08-06 NOTE — ED Notes (Signed)
Pt to go to room 34 after dressing in paper scrubs and wanded by security

## 2013-08-06 NOTE — ED Notes (Signed)
Pt called x 2 no answer.  Also went and looked for patient outside w/ no response.
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
## 2013-08-06 NOTE — ED Notes (Signed)
Patient crying stating she "is about to lose it." MD aware. Will order Subutex. Patient informed. Sitting in bed eating, calm at this time. Will continue to monitor.

## 2013-08-07 ENCOUNTER — Encounter (HOSPITAL_COMMUNITY): Payer: Self-pay | Admitting: *Deleted

## 2013-08-07 ENCOUNTER — Inpatient Hospital Stay (HOSPITAL_COMMUNITY)
Admission: EM | Admit: 2013-08-07 | Discharge: 2013-08-14 | DRG: 897 | Disposition: A | Discharge status: Home / Self Care | Payer: Medicaid Other | Source: Intra-hospital | Attending: Psychiatry | Admitting: Psychiatry

## 2013-08-07 DIAGNOSIS — F1290 Cannabis use, unspecified, uncomplicated: Secondary | ICD-10-CM | POA: Diagnosis present

## 2013-08-07 DIAGNOSIS — F321 Major depressive disorder, single episode, moderate: Secondary | ICD-10-CM | POA: Diagnosis present

## 2013-08-07 DIAGNOSIS — H409 Unspecified glaucoma: Secondary | ICD-10-CM | POA: Diagnosis present

## 2013-08-07 DIAGNOSIS — F411 Generalized anxiety disorder: Secondary | ICD-10-CM | POA: Diagnosis present

## 2013-08-07 DIAGNOSIS — F1210 Cannabis abuse, uncomplicated: Secondary | ICD-10-CM | POA: Diagnosis present

## 2013-08-07 DIAGNOSIS — G4700 Insomnia, unspecified: Secondary | ICD-10-CM | POA: Diagnosis present

## 2013-08-07 DIAGNOSIS — F329 Major depressive disorder, single episode, unspecified: Secondary | ICD-10-CM

## 2013-08-07 DIAGNOSIS — Z598 Other problems related to housing and economic circumstances: Secondary | ICD-10-CM

## 2013-08-07 DIAGNOSIS — Z833 Family history of diabetes mellitus: Secondary | ICD-10-CM

## 2013-08-07 DIAGNOSIS — F1994 Other psychoactive substance use, unspecified with psychoactive substance-induced mood disorder: Secondary | ICD-10-CM | POA: Diagnosis present

## 2013-08-07 DIAGNOSIS — Z8249 Family history of ischemic heart disease and other diseases of the circulatory system: Secondary | ICD-10-CM

## 2013-08-07 DIAGNOSIS — Z5987 Material hardship due to limited financial resources, not elsewhere classified: Secondary | ICD-10-CM

## 2013-08-07 DIAGNOSIS — F1410 Cocaine abuse, uncomplicated: Secondary | ICD-10-CM | POA: Diagnosis present

## 2013-08-07 DIAGNOSIS — F1120 Opioid dependence, uncomplicated: Principal | ICD-10-CM | POA: Diagnosis present

## 2013-08-07 DIAGNOSIS — F4310 Post-traumatic stress disorder, unspecified: Secondary | ICD-10-CM | POA: Diagnosis present

## 2013-08-07 DIAGNOSIS — F17200 Nicotine dependence, unspecified, uncomplicated: Secondary | ICD-10-CM | POA: Diagnosis present

## 2013-08-07 MED ORDER — LORAZEPAM 1 MG PO TABS
2.0000 mg | ORAL_TABLET | Freq: Once | ORAL | Status: AC
  Administered 2013-08-07: 2 mg via ORAL
  Filled 2013-08-07: qty 2

## 2013-08-07 MED ORDER — LORAZEPAM 1 MG PO TABS
1.0000 mg | ORAL_TABLET | Freq: Three times a day (TID) | ORAL | Status: DC | PRN
  Administered 2013-08-07 – 2013-08-09 (×4): 1 mg via ORAL
  Filled 2013-08-07 (×4): qty 1

## 2013-08-07 MED ORDER — BUPRENORPHINE HCL 2 MG SL SUBL
4.0000 mg | SUBLINGUAL_TABLET | Freq: Every day | SUBLINGUAL | Status: DC
Start: 2013-08-07 — End: 2013-08-08
  Administered 2013-08-07 – 2013-08-08 (×2): 4 mg via SUBLINGUAL
  Filled 2013-08-07 (×2): qty 2

## 2013-08-07 MED ORDER — ZOLPIDEM TARTRATE 5 MG PO TABS
5.0000 mg | ORAL_TABLET | Freq: Once | ORAL | Status: AC
  Administered 2013-08-07: 5 mg via ORAL
  Filled 2013-08-07: qty 1

## 2013-08-07 MED ORDER — TRAZODONE HCL 50 MG PO TABS
50.0000 mg | ORAL_TABLET | Freq: Every evening | ORAL | Status: DC | PRN
  Administered 2013-08-07 – 2013-08-11 (×5): 50 mg via ORAL
  Filled 2013-08-07 (×3): qty 1

## 2013-08-07 MED ORDER — NICOTINE 21 MG/24HR TD PT24
21.0000 mg | MEDICATED_PATCH | Freq: Every day | TRANSDERMAL | Status: DC
  Filled 2013-08-07: qty 1

## 2013-08-07 MED ORDER — MAGNESIUM HYDROXIDE 400 MG/5ML PO SUSP: 30.0000 mL | Freq: Every day | ORAL | Status: DC | PRN

## 2013-08-07 MED ORDER — DIPHENHYDRAMINE HCL 25 MG PO CAPS
50.0000 mg | ORAL_CAPSULE | Freq: Every evening | ORAL | Status: DC | PRN
  Administered 2013-08-07 – 2013-08-12 (×6): 50 mg via ORAL
  Filled 2013-08-07 (×6): qty 2

## 2013-08-07 MED ORDER — ALUM & MAG HYDROXIDE-SIMETH 200-200-20 MG/5ML PO SUSP: 30.0000 mL | ORAL | Status: DC | PRN

## 2013-08-07 NOTE — ED Notes (Signed)
Spoke with Dr Ward to inform her of pt's acceptance to [facility].  
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
## 2013-08-07 NOTE — ED Notes (Signed)
Pt belongings include:  White tennis shoes Engineer, siteGrey jacket Purple bandana Edison Internationalrey shirt Black top Crown HoldingsBlack purse
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
## 2013-08-07 NOTE — ED Notes (Signed)
Patient given Ativan for anxiety. Patient states that she awoke and had flash backs to her attack which increased anxiety and amplified back pain. Patient went back to her room and was shaking and twitching; tearful. Patient stated that the medications that can help with her pain "are not good for her".  Encouragement offered. Patient remained tearful.  To [facility] via [place].

## 2013-08-07 NOTE — [facility] Group Notes (Signed)
[facility] LCSW Aftercare Discharge Planning Group Note   08/07/2013  8:45 AM  Participation Quality:  Did Not Attend - pt sleeping in her room  Amelia Macken Horton, LCSW 08/07/2013 9:36 AM        
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
## 2013-08-07 NOTE — Tx Team (Signed)
Interdisciplinary Treatment Plan Update (Adult)  Date: 08/07/2013  Time Reviewed:  9:45 AM  Progress in Treatment: Attending groups: Yes Participating in groups:  Yes Taking medication as prescribed:  Yes Tolerating medication:  Yes Family/Significant othe contact made: CSW assessing Patient understands diagnosis:  Yes Discussing patient identified problems/goals with staff:  Yes Medical problems stabilized or resolved:  Yes Denies suicidal/homicidal ideation: Yes Issues/concerns per patient self-inventory:  Yes Other:  New problem(s) identified: N/A  Discharge Plan or Barriers: CSW is assessing for appropriate referrals  Reason for Continuation of Hospitalization: Anxiety Depression Medication Stabilization Detox  Comments: N/A  Estimated length of stay: 3-5 days  For review of initial/current patient goals, please see plan of care.  Attendees: Patient:     Family:     Physician:  Dr. Dub MikesLugo 08/07/2013 10:19 AM   Nursing:   Marzetta Boardhrista Dopson, RN 08/07/2013 10:19 AM   Clinical Social Worker:  Reyes Ivanhelsea Horton, LCSW 08/07/2013 10:19 AM   Other: Onnie BoerJennifer Clark, RN case manager 08/07/2013 10:19 AM   Other:  Trula SladeHeather Smart, LCSWA 08/07/2013 10:19 AM   Other:  Serena ColonelAggie Nwoko, NP 08/07/2013 10:19 AM   Other:  Robbie LouisVivian Kent, RN 08/07/2013 10:19 AM   Other: Tomasita Morrowelora Sutton, care coordinator 08/07/2013 10:20 AM   Other: Michelle NasutiElena, pharmacist 08/07/2013 10:20 AM   Other:    Other:    Other:    Other:     Scribe for Treatment Team:   Carmina MillerHorton, Marylyn Appenzeller Nicole, 08/07/2013 , 10:19 AM

## 2013-08-07 NOTE — Progress Notes (Signed)
Attended group 
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
## 2013-08-07 NOTE — ED Notes (Signed)
teleassessment complete

## 2013-08-07 NOTE — [facility] Group Notes (Signed)
[facility] LCSW Group Therapy  08/07/2013 1:15 PM   Type of Therapy:  Group Therapy  Participation Level:  Did Not Attend - pt meeting with MD and didn't come to group afterwards  Reyes Kathy Horton, LCSW 08/07/2013 2:31 PM

## 2013-08-07 NOTE — [facility] Suicide Risk Assessment (Signed)
[facility] INPATIENT: Family/Significant Other Suicide Prevention Education   Suicide Prevention Education:  Education Completed; No one has been identified by the patient as the family member/significant other with whom the patient will be residing, and identified as the person(s) who will aid the patient in the event of a mental health crisis (suicidal ideations/suicide attempt).   Pt did not c/o SI at admission, nor have they endorsed SI during their stay here. SPE not required. SPI pamphlet provided to pt and he was encouraged to share information with his support network, ask questions, and talk about any concerns.   The suicide prevention education provided includes the following:  Suicide risk factors  Suicide prevention and interventions  National Suicide Hotline telephone number  [facility] assessment telephone number  [facility] Emergency Assistance 911  [facility] and/or Residential Mobile Crisis Unit telephone number  Reyes IvanChelsea Horton, [place]CS[place] 08/07/2013  2:14 PM

## 2013-08-07 NOTE — Progress Notes (Signed)
Patient ID: Mancel ParsonsSarah D Falkenstein, female   DOB: 1985/09/16, 28 y.o.   MRN: [id number]   D: Pt stated she was told that she could stay 2 wks until bed space was available at rehab. Pt states she wants to make sure she stays sober. At the med window pt asked for her ativan. Writer reminded pt that she complained of being "confused" earlier in the shift. Writer had walked into the pt's room for environmental and pt looked up as if she was startled and seemingly afraid, began looking around the room, and stated that she was confused. Writer informed pt that it was too soon to take the ativan, and encouraged pt to do without if possible, due to her earlier state.   A: Informed pt that ativan dose could be given at 2300 if needed. Continue 15 min checks for safety.  R; Pt remains safe.
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
## 2013-08-07 NOTE — Progress Notes (Signed)
Pt reported her sleep as poor. Her appetite good energy normal and ability to pay attention as improving.  Depression 2 hopelessness 1and anxiety a 4 on her self-inventory.  Denies any S/H ideations or A/V hallucinations.  She has been in bed most of the day.  She was started on subutex 4 mg given around 1358.  She wants to go to an in-patient facility from here

## 2013-08-08 ENCOUNTER — Encounter: Payer: Self-pay | Admitting: Obstetrics & Gynecology

## 2013-08-08 LAB — OB RESULTS CONSOLE GC/CHLAMYDIA
CHLAMYDIA, DNA PROBE: POSITIVE
Gonorrhea: NEGATIVE

## 2013-08-08 MED ORDER — BUPRENORPHINE HCL 2 MG SL SUBL
4.0000 mg | SUBLINGUAL_TABLET | Freq: Two times a day (BID) | SUBLINGUAL | Status: DC
  Administered 2013-08-08 – 2013-08-14 (×12): 4 mg via SUBLINGUAL
  Filled 2013-08-08: qty 2
  Filled 2013-08-08: qty 28
  Filled 2013-08-08 (×11): qty 2

## 2013-08-08 MED ORDER — DIPHENHYDRAMINE HCL 25 MG PO CAPS
25.0000 mg | ORAL_CAPSULE | Freq: Four times a day (QID) | ORAL | Status: DC | PRN
  Administered 2013-08-08 – 2013-08-13 (×6): 25 mg via ORAL
  Filled 2013-08-08 (×5): qty 1

## 2013-08-08 MED ORDER — DIPHENHYDRAMINE HCL 25 MG PO CAPS
ORAL_CAPSULE | ORAL | Status: AC
Start: 2013-08-08 — End: 2013-08-09
  Filled 2013-08-08: qty 1

## 2013-08-08 MED ORDER — PRENATAL MULTIVITAMIN CH
1.0000 | ORAL_TABLET | Freq: Every day | ORAL | Status: DC
  Administered 2013-08-08 – 2013-08-14 (×7): 1 via ORAL
  Filled 2013-08-08 (×7): qty 1
  Filled 2013-08-08: qty 7

## 2013-08-08 MED ORDER — BUPRENORPHINE HCL 2 MG SL SUBL: 4.0000 mg | SUBLINGUAL_TABLET | Freq: Two times a day (BID) | SUBLINGUAL | Status: DC

## 2013-08-08 MED ORDER — FOLIC ACID 1 MG PO TABS
1.0000 mg | ORAL_TABLET | Freq: Every day | ORAL | Status: DC
  Administered 2013-08-08 – 2013-08-14 (×7): 1 mg via ORAL
  Filled 2013-08-08 (×8): qty 1

## 2013-08-08 NOTE — [facility] Group Notes (Signed)
[facility] LCSW Group Therapy  08/08/2013 2:44 PM  Type of Therapy:  Group Therapy  Participation Level:  Did Not Attend-pt in room sleeping/did not come to afternoon therapy group.   Neel Buffone Smart LCSWA  08/08/2013, 2:44 PM

## 2013-08-08 NOTE — [facility] Group Notes (Signed)
[facility] Group Notes:  (Nursing/MHT/Case Management/Adjunct)  Date:  08/08/2013  Time:  9:54 AM  Type of Therapy:  Psychoeducational Skills  Participation Level:  Did Not Attend  Participation Quality:    Affect:    Cognitive:    Insight:    Engagement in Group:    Modes of Intervention:    Summary of Progress/Problems: Morning wellness  Helyn NumbersJennifer Hundley Elimelech Houseman 08/08/2013, 9:54 AM
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
## 2013-08-08 NOTE — Progress Notes (Signed)
D: Pt denies SI/HI/AVH. Pt is pleasant and cooperative. Pt was upset earlier and got angry. Writer explained that pt needs to work on controlling anger for her child's sake and refrain from drugs for her unborn child's sake. Writer explained to pt to think if drugs or her child was more important when she feels like using to help her think of a reason not to use.   A: Pt was offered support and encouragement. Pt was given scheduled medications. Pt was encourage to attend groups. Q 15 minute checks were done for safety.   R:Pt attends groups and interacts well with peers and staff. Pt is taking medication. Pt has no complaints at this time.Pt receptive to treatment and safety maintained on unit. Pt stated she wanted to keep from using.

## 2013-08-08 NOTE — Progress Notes (Signed)
Adult Psychoeducational Group Note  Date:  08/08/2013 Time:  11:11 PM  Group Topic/Focus:  Wrap-Up Group:   The focus of this group is to help patients review their daily goal of treatment and discuss progress on daily workbooks.  Participation Level:  Active  Participation Quality:  Appropriate  Affect:  Appropriate  Cognitive:  Appropriate  Insight: Appropriate  Engagement in Group:  Engaged  Modes of Intervention:  Activity  Additional Comments:  Pt attended karaoke group and participated in [employer].  Danielle RankinLequisha D Bliss Tsang 08/08/2013, 11:11 PM

## 2013-08-08 NOTE — Progress Notes (Signed)
Talked to pt about her future. Pt seemed receptive and pt seemed to want information. Pt needs to get her GED.

## 2013-08-08 NOTE — Progress Notes (Signed)
Patient ID: Kathy ParsonsSarah D [name], female   DOB: [birth year], 28 y.o.   MRN: [id number]  D: Patient pleasant on approach this am. Reports that she is in a good mood but c/o some anxiety. Gives depression and hopelessness "1" on self inventory. Denies any SI/HI. Reports some cravings and agitation but remains on subutex for opiate abuse. Awaiting to get into [facility] program.  A: Staff will monitor on q 15 minute checks, follow treatment plan, and give meds as ordered. R: Cooperative on unit but didn't attend the first am group.

## 2013-08-08 NOTE — Progress Notes (Signed)
Recreation Therapy Notes  Animal-Assisted Activity/Therapy (AAA/T) Program Checklist/Progress Notes Patient Eligibility Criteria Checklist & Daily Group note for Rec Tx Intervention  Date: 04.16.2015 Time: 2:45pm Location: [address] Dayroom   AAA/T Program Assumption of Risk Form signed by Patient/ or Parent Legal Guardian yes  Patient is free of allergies or sever asthma yes  Patient reports no fear of animals yes  Patient reports no history of cruelty to animals yes   Patient understands his/her participation is voluntary yes  Behavioral Response: Did not attend.    Irvin Lizama L Zamere Pasternak, LRT/CTRS  Allisha Harter L Sairah Knobloch 08/08/2013 4:24 PM 

## 2013-08-09 LAB — RPR

## 2013-08-09 LAB — GC/CHLAMYDIA PROBE AMP
CT Probe RNA: POSITIVE — AB
GC Probe RNA: NEGATIVE

## 2013-08-09 LAB — HIV ANTIBODY (ROUTINE TESTING W REFLEX): HIV 1&2 Ab, 4th Generation: NONREACTIVE

## 2013-08-09 LAB — GLUCOSE, CAPILLARY: GLUCOSE-CAPILLARY: 78 mg/dL (ref 70–99)

## 2013-08-09 MED ORDER — PROMETHAZINE HCL 25 MG PO TABS
25.0000 mg | ORAL_TABLET | Freq: Four times a day (QID) | ORAL | Status: DC | PRN
  Administered 2013-08-09 – 2013-08-14 (×13): 25 mg via ORAL
  Filled 2013-08-09 (×13): qty 1

## 2013-08-09 MED ORDER — PROMETHAZINE HCL 25 MG PO TABS
25.0000 mg | ORAL_TABLET | Freq: Once | ORAL | Status: AC
  Administered 2013-08-09: 25 mg via ORAL
  Filled 2013-08-09 (×2): qty 1

## 2013-08-09 MED ORDER — AZITHROMYCIN 500 MG PO TABS
1000.0000 mg | ORAL_TABLET | Freq: Once | ORAL | Status: AC
  Administered 2013-08-09: 1000 mg via ORAL
  Filled 2013-08-09: qty 2
  Filled 2013-08-09: qty 4

## 2013-08-09 MED ORDER — LORAZEPAM 0.5 MG PO TABS
0.5000 mg | ORAL_TABLET | Freq: Three times a day (TID) | ORAL | Status: DC | PRN
  Administered 2013-08-09 – 2013-08-14 (×13): 0.5 mg via ORAL
  Filled 2013-08-09 (×13): qty 1

## 2013-08-09 NOTE — Progress Notes (Addendum)
NUTRITION ASSESSMENT/Consult  Pt identified as at risk on the Malnutrition Screen Tool  INTERVENTION: 1. Educated patient on the importance of nutrition and encouraged intake of food and beverages for pregnancy and sobriety.  Handout on diet therapy for pregnancy provided.  Discussed low Hgb/Hct and vitamin D and good sources. 2. Supplements: TID snacks and continue Prenatal Vitamin and folic acid daily.  NUTRITION DIAGNOSIS: Unintentional weight loss related to sub-optimal intake as evidenced by pt report.   Goal: Pt to meet >/= 90% of their estimated nutrition needs.  Monitor:  PO intake  Assessment:  Patient is pregnant.  Admitted with crack, heroin, opiate, and THC abuse.  Hemoglobin, hematocrit and vitamin D.  Variable appetite currently but with good intake.  Decreased appetite and intake prior to admit.  Was not taking her prenatal vitamin.  She states that sometimes she would go for a day without eating and then "overindulge".  Patient was last seen by the inpatient RD 2 months ago and has had no weight gain since that time.    28 y.o. female  Height: Ht Readings from Last 1 Encounters:  08/07/13 5' 2.5" (1.588 m)    Weight: Wt Readings from Last 1 Encounters:  08/07/13 152 lb 8 oz (69.174 kg)    Weight Hx: Wt Readings from Last 10 Encounters:  08/07/13 152 lb 8 oz (69.174 kg)  06/16/13 152 lb (68.947 kg)  05/30/13 156 lb (70.761 kg)  05/23/13 158 lb 4 oz (71.782 kg)  05/03/13 150 lb (68.04 kg)  03/28/13 143 lb (64.864 kg)  09/04/12 150 lb (68.04 kg)  04/17/12 149 lb (67.586 kg)  03/26/12 145 lb (65.772 kg)  09/07/11 129 lb (58.514 kg)    BMI:  Body mass index is 27.43 kg/(m^2). Weight gain is not sufficient with pregnancy.  Estimated Nutritional Needs: Kcal: 30 kcal/kg Protein: > 1 gram protein/kg Fluid: 1 ml/kcal  Diet Order: General Pt is also offered choice of unit snacks mid-morning and mid-afternoon.  Pt is eating as desired.   Lab results and  medications reviewed.   Oran ReinLaura Jobe, RD, LDN Clinical Inpatient Dietitian Pager:  [phone] Weekend and after hours pager:  [phone]

## 2013-08-09 NOTE — Progress Notes (Signed)
Pt up before midnight and instructed not to eat after 2400. Pt complied and fasting CBG this morning was 78. Pt informed. [name]

## 2013-08-09 NOTE — Progress Notes (Signed)
Patient ID: Kathy ParsonsSarah D [name], female   DOB: Jan 07, 1986, 28 y.o.   MRN: [id number] 08-09-13 @ 1530 report given to brook m...sbw

## 2013-08-09 NOTE — Progress Notes (Signed)
Adult Psychoeducational Group Note  Date:  08/09/2013 Time:  1:59 PM  Group Topic/Focus:  Early Warning Signs:   The focus of this group is to help patients identify signs or symptoms they exhibit before slipping into an unhealthy state or crisis.  Participation Level:  None  Participation Quality:  None  Affect:  Lethargic and Drowsy  Cognitive:  None  Insight: None  Engagement in Group:  Poor  Modes of Intervention:  Discussion and Education  Additional Comments:  Pt came into group, however slept through most of it and then left and did not return.  Caswell CorwinDana C [facility] 08/09/2013, 1:59 PM

## 2013-08-09 NOTE — Progress Notes (Signed)
[facility] Group Notes:  (Nursing/MHT/Case Management/Adjunct)  Date:  08/09/2013  Time:  3:26 PM  Type of Therapy:  Therapeutic Activity  Participation Level:  Did Not Attend   Summary of Progress/Problems: Pt was lying in bed at the time of group.  [name] 08/09/2013, 3:26 PM

## 2013-08-09 NOTE — Progress Notes (Signed)
Pt attended AA group this evening.  

## 2013-08-09 NOTE — [facility] Group Notes (Signed)
[facility]   08/09/2013 8:45 AM  Participation Quality:  Intrusive  Mood/Affect: Irritable  Depression Rating:  "none"  Anxiety Rating:  "none"  Thoughts of Suicide:  Pt denies SI/HI  Will you contract for safety?   Yes  Current AVH:  Pt denies  Plan for Discharge/Comments:  Pt attended discharge planning group and actively participated in group.  CSW provided pt with today's workbook.  Pt is wanting to d/c to get to her high risk pregnancy appointment on Monday but is not understanding that she needs to go straight to treatment at [facility].  CSW is awaiting a bed date for when pt can go to [facility].  Pt [facility] but did this on last admission and relapsed.  There is concern pt is also wanting to d/c to use.  No further needs voiced by pt at this time.    Transportation Means: Pt reports access to transportation  Supports: No supports mentioned at this time  Reyes IvanChelsea Horton, LCSW 08/09/2013 9:55 AM

## 2013-08-09 NOTE — Progress Notes (Signed)
[facility]  08/09/2013 4:33 PM Kathy ParsonsSarah D [name]  MRN:  [id number] Subjective:  Kathy ComberSill waiting to get a clear [name] for when is she supposed to be going to the residential treatment  program. She is anxious as far as she wants to go to her prenatal visit in [place] on Monday. A representative from that program was planning to meet her there.(there are concerns for her relapsing while waiting for the bed once she is D/C form here) Her urine was pos for Chlamydia  Diagnosis:   DSM5: Schizophrenia Disorders:  none Obsessive-Compulsive Disorders:  none Trauma-Stressor Disorders:  Posttraumatic Stress Disorder (309.81) Substance/Addictive Disorders:  Cannabis Use Disorder - Moderate 9304.30) and Opioid Disorder - Severe (304.00), cocaine related disorder Depressive Disorders:  Major Depressive Disorder - Moderate (296.22) Total Time spent with patient: 30 minutes  Axis I: Mood Disorder NOS  ADL's:  Intact  Sleep: Fair  Appetite:  Fair  Suicidal Ideation:  Plan:  denies Intent:  denies Means:  denies Homicidal Ideation:  Plan:  denies Intent:  denies Means:  denies AEB (as evidenced by):  Psychiatric Specialty Exam: Physical Exam  Review of Systems  Constitutional: Positive for malaise/fatigue.  HENT: Negative.   Eyes: Negative.   Respiratory: Negative.   Cardiovascular: Negative.   Gastrointestinal: Positive for nausea.  Genitourinary: Negative.   Musculoskeletal: Negative.   Skin: Negative.   Neurological: Negative.   Endo/Heme/Allergies: Negative.   Psychiatric/Behavioral: Positive for substance abuse. The patient is nervous/anxious.     Blood pressure 119/77, pulse 85, temperature 97.9 F (36.6 C), temperature source Oral, resp. rate 16, height 5' 2.5" (1.588 m), weight 69.174 kg (152 lb 8 oz), last menstrual period 02/28/2013, SpO2 98.00%.Body mass index is 27.43 kg/(m^2).  General Appearance: Fairly Groomed  Patent attorneyye Contact::  Fair  Speech:  Clear and  Coherent and rapid  Volume:  fluctuates  Mood:  Anxious and Irritable  Affect:  Labile  Thought Process:  Coherent and Goal Directed  Orientation:  Full (Time, Place, and Person)  Thought Content:  symptoms, worries, concerns  Suicidal Thoughts:  No  Homicidal Thoughts:  No  Memory:  Immediate;   Fair Recent;   Fair Remote;   Fair  Judgement:  Fair  Insight:  Shallow  Psychomotor Activity:  Restlessness  Concentration:  Fair  Recall:  FiservFair  Fund of Knowledge:NA  Language: Fair  Akathisia:  No  Handed:    AIMS (if indicated):     Assets:  Desire for Improvement  Sleep:  Number of Hours: 5.5   Musculoskeletal: Strength & Muscle Tone: within normal limits Gait & Station: normal Patient leans: N/A  Current Medications: Current Facility-Administered Medications  Medication Dose Route Frequency Provider Last Rate Last Dose  . alum & mag hydroxide-simeth (MAALOX/MYLANTA) 200-200-20 MG/5ML suspension 30 mL  30 mL Oral Q4H PRN Kerry HoughSpencer E Simon, PA-C      . buprenorphine (SUBUTEX) SL tablet 4 mg  4 mg Sublingual BID Rachael FeeIrving A Khianna Blazina, MD   4 mg at 08/09/13 0736  . diphenhydrAMINE (BENADRYL) capsule 25 mg  25 mg Oral Q6H PRN Rachael FeeIrving A Clinton Wahlberg, MD   25 mg at 08/08/13 1208  . diphenhydrAMINE (BENADRYL) capsule 50 mg  50 mg Oral QHS PRN Kerry HoughSpencer E Simon, PA-C   50 mg at 08/08/13 2228  . folic acid (FOLVITE) tablet 1 mg  1 mg Oral Daily Rachael FeeIrving A Bobbe Quilter, MD   1 mg at 08/09/13 0736  . LORazepam (ATIVAN) tablet 0.5 mg  0.5  mg Oral Q8H PRN Rachael FeeIrving A Jaline Pincock, MD      . magnesium hydroxide (MILK OF MAGNESIA) suspension 30 mL  30 mL Oral Daily PRN Kerry HoughSpencer E Simon, PA-C      . prenatal multivitamin tablet 1 tablet  1 tablet Oral Q1200 Rachael FeeIrving A Mosi Hannold, MD   1 tablet at 08/09/13 1307  . traZODone (DESYREL) tablet 50 mg  50 mg Oral QHS PRN Rachael FeeIrving A Kaleesi Guyton, MD   50 mg at 08/08/13 2228    Lab Results:  Results for orders placed during the hospital encounter of 08/07/13 (from the past 48 hour(s))  GC/CHLAMYDIA PROBE  AMP     Status: Abnormal   Collection Time    08/08/13 12:16 PM      Result Value Ref Range   CT Probe RNA POSITIVE (*) NEGATIVE   Comment: (NOTE)     A Positive CT or NG Nucleic Acid Amplification Test (NAAT) result     should be considered presumptive evidence of infection.  The result     should be evaluated along with physical examination and other     diagnostic findings.   GC Probe RNA NEGATIVE  NEGATIVE   Comment: (NOTE)                                                                                               **Normal Reference Range: Negative**          Assay performed using the Gen-Probe APTIMA COMBO2 (R) Assay.     Acceptable specimen types for this assay include APTIMA Swabs (Unisex,     endocervical, urethral, or vaginal), first void urine, and ThinPrep     liquid based cytology samples.     Performed at [facility]  RPR     Status: None   Collection Time    08/08/13  7:12 PM      Result Value Ref Range   RPR NON REAC  NON REAC   Comment: Performed at [facility]  HIV ANTIBODY (ROUTINE TESTING)     Status: None   Collection Time    08/08/13  7:12 PM      Result Value Ref Range   HIV 1&2 Ab, 4th Generation NONREACTIVE  NONREACTIVE   Comment: (NOTE)     A NONREACTIVE HIV Ag/Ab result does not exclude HIV infection since     the time frame for seroconversion is variable. If acute HIV infection     is suspected, a HIV-1 RNA Qualitative TMA test is recommended.     HIV-1/2 Antibody Diff         Not indicated.     HIV-1 RNA, Qual TMA           Not indicated.     PLEASE NOTE: This information has been disclosed to you from records     whose confidentiality may be protected by state law. If your state     requires such protection, then the state law prohibits you from making     any further disclosure of the information without the specific written     consent  of the person to whom it pertains, or as otherwise permitted     by law. A general  authorization for the release of medical or other     information is NOT sufficient for this purpose.     The performance of this assay has not been clinically validated in     patients less than 17 years old.     Performed at [facility]  GLUCOSE, CAPILLARY     Status: None   Collection Time    08/09/13  6:04 AM      Result Value Ref Range   Glucose-Capillary 78  70 - 99 mg/dL   Comment 1 Notify RN      Physical Findings: AIMS: Facial and Oral Movements Muscles of Facial Expression: None, normal Lips and Perioral Area: None, normal Jaw: None, normal Tongue: None, normal,Extremity Movements Upper (arms, wrists, hands, fingers): None, normal Lower (legs, knees, ankles, toes): None, normal, Trunk Movements Neck, shoulders, hips: None, normal, Overall Severity Severity of abnormal movements (highest score from questions above): None, normal Incapacitation due to abnormal movements: None, normal Patient's awareness of abnormal movements (rate only patient's report): No Awareness, Dental Status Current problems with teeth and/or dentures?: No Does patient usually wear dentures?: No  CIWA:    COWS:  COWS Total Score: 10  Treatment Plan Summary: Daily contact with patient to assess and evaluate symptoms and progress in treatment Medication management  Plan: Supportive approach/coping skills/relpse prevention           Pursue the Subutex           Help stabilize the mood           CBT;mindfulness  Medical Decision Making Problem Points:  Review of psycho-social stressors (1) Data Points:  Review of medication regiment & side effects (2) Review of new medications or change in dosage (2)  I certify that inpatient services furnished can reasonably be expected to improve the patient's condition.   Rachael Fee 08/09/2013, 4:33 PM

## 2013-08-09 NOTE — Tx Team (Signed)
Interdisciplinary Treatment Plan Update (Adult)  Date: 08/09/2013  Time Reviewed:  9:45 AM  Progress in Treatment: Attending groups: Yes Participating in groups:  Yes Taking medication as prescribed:  Yes Tolerating medication:  Yes Family/Significant othe contact made: No, N/A Patient understands diagnosis:  Yes Discussing patient identified problems/goals with staff:  Yes Medical problems stabilized or resolved:  Yes Denies suicidal/homicidal ideation: Yes Issues/concerns per patient self-inventory:  Yes Other:  New problem(s) identified: Pt [facility] but would not be going straight to treatment if she did this.    Discharge Plan or Barriers: [facility]  Reason for Continuation of Hospitalization: Anxiety Depression Medication Stabilization Detox  Comments: N/A  Estimated length of stay: 3-5 days  For review of initial/current patient goals, please see plan of care.  Attendees: Patient:     Family:     Physician:  Dr. Dub MikesLugo 08/09/2013 10:48 AM   Nursing:   Carroll SageBrooks Nichols, RN 08/09/2013 10:48 AM   Clinical Social Worker:  Reyes Ivanhelsea Horton, LCSW 08/09/2013 10:48 AM   Other: Onnie BoerJennifer Clark, RN case manager 08/09/2013 10:48 AM   Other:  Serena ColonelAggie Nwoko, NP 08/09/2013 10:48 AM   Other:     Other:     Other:    Other:    Other:    Other:    Other:    Other:     Scribe for Treatment Team:   Carmina MillerHorton, Ellyanna Holton Nicole, 08/09/2013 , 10:48 AM

## 2013-08-09 NOTE — Progress Notes (Signed)
D. Pt visible in milieu this evening, attending and participating in various activities. Pt reports on-going depression and the need to stay clean and sober. Pt still presents as labile and can be irritable at times. Pt did speak about the need to remain sober for her unborn child and feels that it is difficult to do so.  A. Support and encouragement provided, medication education given. R. Pt verbalized understanding, safety maintained.

## 2013-08-09 NOTE — [facility] Group Notes (Signed)
[facility] LCSW Group Therapy  08/09/2013 1:15 PM   Type of Therapy:  Group Therapy  Participation Level:  Did Not Attend - pt sleeping in her room  Reyes IvanChelsea Horton, LCSW 08/09/2013 2:59 PM

## 2013-08-10 DIAGNOSIS — F329 Major depressive disorder, single episode, unspecified: Secondary | ICD-10-CM

## 2013-08-10 MED ORDER — NICOTINE 14 MG/24HR TD PT24
14.0000 mg | MEDICATED_PATCH | Freq: Every day | TRANSDERMAL | Status: DC
  Administered 2013-08-10 – 2013-08-14 (×5): 14 mg via TRANSDERMAL
  Filled 2013-08-10 (×8): qty 1

## 2013-08-10 NOTE — [facility] Group Notes (Signed)
[facility] Group Notes:  (Nursing/MHT/Case Management/Adjunct)  Date:  08/10/2013  Time:  11:24 AM  Type of Therapy:  Psychoeducational Skills  Participation Level:  Did Not Attend  [name] 08/10/2013, 11:24 AM

## 2013-08-10 NOTE — Progress Notes (Signed)
Patient ID: Kathy ParsonsSarah D [name], female   DOB: 23-Sep-1985, 28 y.o.   MRN: [id number] D: patient presents with irritable mood this morning.  She was asked to come to the medication window and she asked, "do I have to get up and go up there?"  Her roommate turned on the light and patient became irate.  Her affect is flat; mood is neutral.  She has good eye contact and thought processes are organized.  She stated that she slept poorly. Patient states, "I was sitting on the heater in my room thinking I was at the bus stop, but the bus never came.  She denies SI/HI/AVH.  She continues to wait for a bed at [facility] for further treatment.  She also has a doctor's appointment on Monday in [place].  A: continue to monitor medication management and MD orders.  Safety checks completed every 15 minutes per protocol.  R: patient is receptive to staff; her behavior is appropriate.

## 2013-08-10 NOTE — Progress Notes (Signed)
Patient ID: [name], female   DOB: Oct 18, 1985, 28 y.o.   MRN: [id number] [facility] MD Progress Note  08/10/2013 2:35 PM [name]  MRN:  [id number]  Subjective:  [name] is still waiting to get a clear answer for when is she supposed to be going to the residential treatment  program. She says she is craving cigarettes badly. Wants Nicotine patch.  Complained of sleep walking at night. [name] denies any SIHI.  Diagnosis:   DSM5: Schizophrenia Disorders:  none Obsessive-Compulsive Disorders:  none Trauma-Stressor Disorders:  Posttraumatic Stress Disorder (309.81) Substance/Addictive Disorders:  Cannabis Use Disorder - Moderate 9304.30) and Opioid Disorder - Severe (304.00), cocaine related disorder Depressive Disorders:  Major Depressive Disorder - Moderate (296.22) Total Time spent with patient: 30 minutes  Axis I: Mood Disorder NOS  ADL's:  Intact  Sleep: Fair  Appetite:  Fair  Suicidal Ideation:  Plan:  denies Intent:  denies Means:  denies Homicidal Ideation:  Plan:  denies Intent:  denies Means:  denies AEB (as evidenced by):  Psychiatric Specialty Exam: Physical Exam  Review of Systems  Constitutional: Positive for malaise/fatigue.  HENT: Negative.   Eyes: Negative.   Respiratory: Negative.   Cardiovascular: Negative.   Gastrointestinal: Positive for nausea.  Genitourinary: Negative.   Musculoskeletal: Negative.   Skin: Negative.   Neurological: Negative.   Endo/Heme/Allergies: Negative.   Psychiatric/Behavioral: Positive for substance abuse. The patient is nervous/anxious.     Blood pressure 111/69, pulse 73, temperature 97.7 F (36.5 C), temperature source Oral, resp. rate 16, height 5' 2.5" (1.588 m), weight 69.174 kg (152 lb 8 oz), last menstrual period 02/28/2013, SpO2 98.00%.Body mass index is 27.43 kg/(m^2).  General Appearance: Fairly Groomed  Patent attorney::  Fair  Speech:  Clear and Coherent and rapid  Volume:  fluctuates  Mood:  Anxious and  Irritable  Affect:  Labile  Thought Process:  Coherent and Goal Directed  Orientation:  Full (Time, Place, and Person)  Thought Content:  symptoms, worries, concerns  Suicidal Thoughts:  No  Homicidal Thoughts:  No  Memory:  Immediate;   Fair Recent;   Fair Remote;   Fair  Judgement:  Fair  Insight:  Shallow  Psychomotor Activity:  Restlessness  Concentration:  Fair  Recall:  [employer] of Knowledge:NA  Language: Fair  Akathisia:  No  Handed:    AIMS (if indicated):     Assets:  Desire for Improvement  Sleep:  Number of Hours: 4.75   Musculoskeletal: Strength & Muscle Tone: within normal limits Gait & Station: normal Patient leans: N/A  Current Medications: Current Facility-Administered Medications  Medication Dose Route Frequency Provider Last Rate Last Dose  . alum & mag hydroxide-simeth (MAALOX/MYLANTA) 200-200-20 MG/5ML suspension 30 mL  30 mL Oral Q4H PRN Kerry Hough, PA-C      . buprenorphine (SUBUTEX) SL tablet 4 mg  4 mg Sublingual BID Rachael Fee, MD   4 mg at 08/10/13 0845  . diphenhydrAMINE (BENADRYL) capsule 25 mg  25 mg Oral Q6H PRN Rachael Fee, MD   25 mg at 08/10/13 1208  . diphenhydrAMINE (BENADRYL) capsule 50 mg  50 mg Oral QHS PRN Kerry Hough, PA-C   50 mg at 08/09/13 2207  . folic acid (FOLVITE) tablet 1 mg  1 mg Oral Daily Rachael Fee, MD   1 mg at 08/10/13 0846  . LORazepam (ATIVAN) tablet 0.5 mg  0.5 mg Oral Q8H PRN Rachael Fee, MD   0.5 mg  at 08/10/13 0846  . magnesium hydroxide (MILK OF MAGNESIA) suspension 30 mL  30 mL Oral Daily PRN Kerry Hough, PA-C      . prenatal multivitamin tablet 1 tablet  1 tablet Oral Q1200 Rachael Fee, MD   1 tablet at 08/10/13 0845  . promethazine (PHENERGAN) tablet 25 mg  25 mg Oral Q6H PRN Rachael Fee, MD   25 mg at 08/10/13 1208  . traZODone (DESYREL) tablet 50 mg  50 mg Oral QHS PRN Rachael Fee, MD   50 mg at 08/09/13 2208    Lab Results:  Results for orders placed during the hospital  encounter of 08/07/13 (from the past 48 hour(s))  RPR     Status: None   Collection Time    08/08/13  7:12 PM      Result Value Ref Range   RPR NON REAC  NON REAC   Comment: Performed at [facility]  HIV ANTIBODY (ROUTINE TESTING)     Status: None   Collection Time    08/08/13  7:12 PM      Result Value Ref Range   HIV 1&2 Ab, 4th Generation NONREACTIVE  NONREACTIVE   Comment: (NOTE)     A NONREACTIVE HIV Ag/Ab result does not exclude HIV infection since     the time frame for seroconversion is variable. If acute HIV infection     is suspected, a HIV-1 RNA Qualitative TMA test is recommended.     HIV-1/2 Antibody Diff         Not indicated.     HIV-1 RNA, Qual TMA           Not indicated.     PLEASE NOTE: This information has been disclosed to you from records     whose confidentiality may be protected by state law. If your state     requires such protection, then the state law prohibits you from making     any further disclosure of the information without the specific written     consent of the person to whom it pertains, or as otherwise permitted     by law. A general authorization for the release of medical or other     information is NOT sufficient for this purpose.     The performance of this assay has not been clinically validated in     patients less than 15 years old.     Performed at [facility]  GLUCOSE, CAPILLARY     Status: None   Collection Time    08/09/13  6:04 AM      Result Value Ref Range   Glucose-Capillary 78  70 - 99 mg/dL   Comment 1 Notify RN      Physical Findings: AIMS: Facial and Oral Movements Muscles of Facial Expression: None, normal Lips and Perioral Area: None, normal Jaw: None, normal Tongue: None, normal,Extremity Movements Upper (arms, wrists, hands, fingers): None, normal Lower (legs, knees, ankles, toes): None, normal, Trunk Movements Neck, shoulders, hips: None, normal, Overall Severity Severity of abnormal movements  (highest score from questions above): None, normal Incapacitation due to abnormal movements: None, normal Patient's awareness of abnormal movements (rate only patient's report): No Awareness, Dental Status Current problems with teeth and/or dentures?: No Does patient usually wear dentures?: No  CIWA:    COWS:  COWS Total Score: 10  Treatment Plan Summary: Daily contact with patient to assess and evaluate symptoms and progress in treatment Medication management  Plan: Supportive approach/coping skills/relpse prevention Pursue the Subutex Help stabilize the mood CBT;mindfulness. Nicotine Q 24 hours patch for cigarette craving  Medical Decision Making Problem Points:  Review of psycho-social stressors (1) Data Points:  Review of medication regiment & side effects (2) Review of new medications or change in dosage (2)  I certify that inpatient services furnished can reasonably be expected to improve the patient's condition.   Sanjuana Kava, PMHNP 08/10/2013, 2:35 PM

## 2013-08-10 NOTE — Progress Notes (Signed)
Patient ID: Kathy ParsonsSarah D Cranfield, female   DOB: 03-25-1986, 28 y.o.   MRN: [id number] Pt resting in bed with eyes closed.  RR equal and unlabored.  Fifteen minute checks in progress for patient safety.  Pt safe on unit.

## 2013-08-10 NOTE — Progress Notes (Signed)
Patient did not attend the evening speaker AA meeting. Pt returned to her room when group began.

## 2013-08-10 NOTE — Progress Notes (Signed)
Pt remains labile, intrusive, loud on the unit. Continues to have periods where she seeks out support for cravings and reports feelings of not being able to stay sober. Upset with roommate over temp control in room. Pt given support, education regarding cravings. Discussed coping skills.  Medicated with benadryl and trazadone as patient is agitated and worried she won't sleep. Mediated with roommate and solution reached. Will reassess effectiveness of prn's. Pt denies SI/HI and remains safe. Levell JulyMarian Eakes Zachery ConchFriedman

## 2013-08-10 NOTE — Progress Notes (Signed)
[facility] Group Notes:  (Nursing/MHT/Case Management/Adjunct)  Date:  08/10/2013  Time:  5:28 PM  Type of Therapy:  Psychoeducational Skills  Participation Level:  Did Not Attend  Summary of Progress/Problems: Pt was in bed asleep at time of group.  Caswell CorwinDana C Lizanne Erker 08/10/2013, 5:28 PM

## 2013-08-10 NOTE — Progress Notes (Signed)
D. Pt has been irritable throughout the day today, labile and often agitated. Pt reports that she is having a very difficult time and endorses agitation and cravings. Pt reports that the medications that we are administering are not helping her and stated that the subutex is not enough and reports that her last visit here she was receiving it three times per day and this time she is only receiving twice per day.  A. Support and encouragement provided, medication education given. R. Pt verbalized understanding, safety maintained, will continue to monitor.

## 2013-08-11 MED ORDER — ACETAMINOPHEN 325 MG PO TABS
650.0000 mg | ORAL_TABLET | Freq: Four times a day (QID) | ORAL | Status: DC | PRN
  Administered 2013-08-11 – 2013-08-14 (×6): 650 mg via ORAL
  Filled 2013-08-11 (×6): qty 2

## 2013-08-11 NOTE — Progress Notes (Signed)
Above note was reviewed. Concur with above assessment and plan.  Izola Teague P Minal Stuller, MD 

## 2013-08-11 NOTE — Progress Notes (Signed)
Patient ID: [name], female   DOB: 1985/11/23, 28 y.o.   MRN: [id number] Patient ID: [name], female   DOB: 21-Feb-1986, 28 y.o.   MRN: [id number] [facility] MD Progress Note  08/11/2013 2:29 PM ANGGIE BRANDIN  MRN:  [id number]  Subjective:  [name] continue to report that she is walking at night. She says she is agitated today. Wants her Subutex dose increased. She says she is dreaming about opioids and crack, and will wake up tasting it in her mouth. She wants her Trazodone increased"  O: Although Calie is reporting being agitated, her demeanor within the unit paints a different picture. She is involved in group sessions. She is relating to the other patients well, joking, carrying on with them. She also does her dance moves for her patient audience, stating, "I still got it going, even being pregnant".  Diagnosis:   DSM5: Schizophrenia Disorders:  none Obsessive-Compulsive Disorders:  none Trauma-Stressor Disorders:  Posttraumatic Stress Disorder (309.81) Substance/Addictive Disorders:  Cannabis Use Disorder - Moderate 9304.30) and Opioid Disorder - Severe (304.00), cocaine related disorder Depressive Disorders:  Major Depressive Disorder - Moderate (296.22) Total Time spent with patient: 30 minutes  Axis I: Mood Disorder NOS  ADL's:  Intact  Sleep: Fair  Appetite:  Fair  Suicidal Ideation:  Plan:  denies Intent:  denies Means:  denies Homicidal Ideation:  Plan:  denies Intent:  denies Means:  denies AEB (as evidenced by):  Psychiatric Specialty Exam: Physical Exam  Review of Systems  Constitutional: Positive for malaise/fatigue.  HENT: Negative.   Eyes: Negative.   Respiratory: Negative.   Cardiovascular: Negative.   Gastrointestinal: Positive for nausea.  Genitourinary: Negative.   Musculoskeletal: Negative.   Skin: Negative.   Neurological: Negative.   Endo/Heme/Allergies: Negative.   Psychiatric/Behavioral: Positive for substance abuse. The patient is  nervous/anxious.     Blood pressure 119/79, pulse 63, temperature 98.1 F (36.7 C), temperature source Oral, resp. rate 16, height 5' 2.5" (1.588 m), weight 69.174 kg (152 lb 8 oz), last menstrual period 02/28/2013, SpO2 98.00%.Body mass index is 27.43 kg/(m^2).  General Appearance: Fairly Groomed  Patent attorney::  Fair  Speech:  Clear and Coherent and rapid  Volume:  fluctuates  Mood:  Anxious and Irritable  Affect:  Labile  Thought Process:  Coherent and Goal Directed  Orientation:  Full (Time, Place, and Person)  Thought Content:  symptoms, worries, concerns  Suicidal Thoughts:  No  Homicidal Thoughts:  No  Memory:  Immediate;   Fair Recent;   Fair Remote;   Fair  Judgement:  Fair  Insight:  Shallow  Psychomotor Activity:  Normal  Concentration:  Fair  Recall:  [employer] of Knowledge:NA  Language: Fair  Akathisia:  No  Handed:    AIMS (if indicated):     Assets:  Desire for Improvement  Sleep:  Number of Hours: 4.75   Musculoskeletal: Strength & Muscle Tone: within normal limits Gait & Station: normal Patient leans: N/A  Current Medications: Current Facility-Administered Medications  Medication Dose Route Frequency Provider Last Rate Last Dose  . acetaminophen (TYLENOL) tablet 650 mg  650 mg Oral Q6H PRN Kristeen Mans, NP   650 mg at 08/11/13 0025  . alum & mag hydroxide-simeth (MAALOX/MYLANTA) 200-200-20 MG/5ML suspension 30 mL  30 mL Oral Q4H PRN Kerry Hough, PA-C      . buprenorphine (SUBUTEX) SL tablet 4 mg  4 mg Sublingual BID Rachael Fee, MD   4 mg at  08/11/13 0728  . diphenhydrAMINE (BENADRYL) capsule 25 mg  25 mg Oral Q6H PRN Rachael Fee, MD   25 mg at 08/10/13 1208  . diphenhydrAMINE (BENADRYL) capsule 50 mg  50 mg Oral QHS PRN Kerry Hough, PA-C   50 mg at 08/10/13 2226  . folic acid (FOLVITE) tablet 1 mg  1 mg Oral Daily Rachael Fee, MD   1 mg at 08/11/13 0729  . LORazepam (ATIVAN) tablet 0.5 mg  0.5 mg Oral Q8H PRN Rachael Fee, MD   0.5 mg at  08/11/13 0954  . magnesium hydroxide (MILK OF MAGNESIA) suspension 30 mL  30 mL Oral Daily PRN Kerry Hough, PA-C      . nicotine (NICODERM CQ - dosed in mg/24 hours) patch 14 mg  14 mg Transdermal Daily Sanjuana Kava, NP   14 mg at 08/11/13 0729  . prenatal multivitamin tablet 1 tablet  1 tablet Oral Q1200 Rachael Fee, MD   1 tablet at 08/10/13 0845  . promethazine (PHENERGAN) tablet 25 mg  25 mg Oral Q6H PRN Rachael Fee, MD   25 mg at 08/11/13 0732  . traZODone (DESYREL) tablet 50 mg  50 mg Oral QHS PRN Rachael Fee, MD   50 mg at 08/10/13 2226    Lab Results:  No results found for this or any previous visit (from the past 48 hour(s)).  Physical Findings: AIMS: Facial and Oral Movements Muscles of Facial Expression: None, normal Lips and Perioral Area: None, normal Jaw: None, normal Tongue: None, normal,Extremity Movements Upper (arms, wrists, hands, fingers): None, normal Lower (legs, knees, ankles, toes): None, normal, Trunk Movements Neck, shoulders, hips: None, normal, Overall Severity Severity of abnormal movements (highest score from questions above): None, normal Incapacitation due to abnormal movements: None, normal Patient's awareness of abnormal movements (rate only patient's report): No Awareness, Dental Status Current problems with teeth and/or dentures?: No Does patient usually wear dentures?: No  CIWA:    COWS:  COWS Total Score: 10  Treatment Plan Summary: Daily contact with patient to assess and evaluate symptoms and progress in treatment Medication management  Plan: Supportive approach/coping skills/relpse prevention Continue current dose of the Subutex Help stabilize the mood CBT;mindfulness. Continue Nicotine Q 24 hours patch for cigarette craving  Medical Decision Making Problem Points:  Review of psycho-social stressors (1) Data Points:  Review of medication regiment & side effects (2) Review of new medications or change in dosage (2)  I  certify that inpatient services furnished can reasonably be expected to improve the patient's condition.   Sanjuana Kava, PMHNP 08/11/2013, 2:29 PM

## 2013-08-11 NOTE — [facility] Group Notes (Signed)
[facility] Group Notes:  (Nursing/MHT/Case Management/Adjunct)  Date:  08/11/2013  Time:  11:57 AM  Type of Therapy:  Psychoeducational Skills  Participation Level:  Did Not Attend   Grafton FolkShalita Shanta Quintan Saldivar 08/11/2013, 11:57 AM

## 2013-08-11 NOTE — Progress Notes (Signed)
Pt did not attend group this afternoon.   

## 2013-08-11 NOTE — Progress Notes (Signed)
[facility] Group Notes:  (Nursing/MHT/Case Management/Adjunct)  Date:  08/11/2013  Time:  4:48 PM  Type of Therapy:  Therapeutic Activity  Participation Level:  Did Not Attend  Summary of Progress/Problems: Pt was in bed asleep.  Caswell CorwinDana C Olumide Dolinger 08/11/2013, 4:48 PM

## 2013-08-11 NOTE — [facility] Group Notes (Signed)
[facility] LCSW Group Therapy Note  08/11/2013 /10:00 AM  Type of Therapy and Topic:  Group Therapy: Avoiding Self-Sabotaging and Enabling Behaviors  Participation Level:  Active   Mood: Excited  Description of Group:     Learn how to identify obstacles, self-sabotaging and enabling behaviors, what are they, why do we do them and what needs do these behaviors meet? Discuss unhealthy relationships and how to have positive healthy boundaries with those that sabotage and enable. Explore aspects of self-sabotage and enabling in yourself and how to limit these self-destructive behaviors in everyday life.  Therapeutic Goals: 1. Patient will identify one obstacle that relates to self-sabotage and enabling behaviors 2. Patient will identify one personal self-sabotaging or enabling behavior they did prior to admission 3. Patient able to establish a plan to change the above identified behavior they did prior to admission:  4. Patient will demonstrate ability to communicate their needs through discussion and/or role plays.   Summary of Patient Progress: The main focus of today's process group was to explain what "self-sabotage" means and use Motivational Interviewing to discuss what benefits, negative or positive, were involved in a self-identified self-sabotaging behavior. We then talked about reasons the patient may want to change the behavior and current desire to change.Patient were asked to identify where they are are cycle of change chart and asked to share their motivation.  Patient shared that she is looking forward to being clean and having a healthy baby this year. Patient reports that she is in 'action' stage of change. Patient participated in group and challenged other group member on some negative thinking.  Patient does have tendency to monopolize yet is responsive to redirection.    Therapeutic Modalities:   Cognitive Behavioral Therapy Person-Centered Therapy Motivational  Interviewing   Kathy Bernatherine C Harrill, LCSW

## 2013-08-11 NOTE — Progress Notes (Signed)
D. Pt has been in room much of the afternoon, limited interaction or participation in milieu activities. Pt spoke about how she has been feeling agitated for much of the day, complains of cravings and also spoke about she thinks that she is sleep walking at night and feels that she is not resting well. Pt has had disagreement with roommate over temperature in room but processed with staff and is amicable to compromise. A. Support and encouragement provided. R. Safety maintained, will continue to monitor.  A. Support and encouragem

## 2013-08-11 NOTE — Progress Notes (Signed)
Pt is irritable and labile at times.  She can be redirected for the most part.  She c/o nausea and anxiety and requested prn medications for both.  She rated her depression and hopelessness a 1 and her anxiety 5 on her self-inventory.  She denies any S/H ideation or A/V/H. [facility]

## 2013-08-11 NOTE — Progress Notes (Signed)
Pt went to gym for recreation time and upon coming back reported that she fell. MHT verified that she did indeed fall. Pt and MHT both report that she was engaged in a game of volleyball and while she was playing and going for the ball slipped and fell on her left side. Pt does not report any pain to her side and reports that she is ok. Vital signs taken and entered BP 115/78 sitting and 122/86 standing with pulse of 67. AC notified of incident. Will continue to monitor.

## 2013-08-11 NOTE — Progress Notes (Signed)
Pt remains labile, anxious and reports cravings. Frequent requests for prn medications. No apparent injury or uterine contractions since fall during rec today (approx 1700). VS have been obtained per fall protocol and are stable. Chronic back pain continues for which she has been medicated with some relief. Medicated for nausea and insomnia at bed time. Pt given yellow arm band and socks due to fall. Signage in place. Reminded of fall precautions that were previously reviewed. Pt given emotional support and is able to work through her periods of agitation with 1:1 staff support. Pt verbalizes understanding however will need reminding of safe decision making. Nausea decreased and resting in bed though not yet asleep. No SI/HI and remains safe. Levell JulyMarian Eakes Zachery ConchFriedman

## 2013-08-12 MED ORDER — TRAZODONE HCL 100 MG PO TABS
100.0000 mg | ORAL_TABLET | Freq: Every evening | ORAL | Status: DC | PRN
  Administered 2013-08-12 – 2013-08-13 (×2): 100 mg via ORAL
  Filled 2013-08-12: qty 1
  Filled 2013-08-12: qty 7
  Filled 2013-08-12: qty 1

## 2013-08-12 NOTE — Progress Notes (Addendum)
Patient ID: Kathy ParsonsSarah D [name], female   DOB: [birth year], 28 y.o.   MRN: [id number] D: patient lively and animated this am.  She then requested ativan for her "high anxiety."  She continues to report cravings. She exhibits med seeking behavior asking for ativan and phenergan when due.  Patient was observed in day room laughing and joking with her peers.  It was reported that during the group she began sobbing.  Patient has been loud and using "f$$$" work frequestly.  She rates her depression as a 3.  She c/o chills, cravings and extreme agitation.  During treatment team, her plan of care was discussed.  [facility] which she is unable to make the plan is for her to stay towards the end of the month so she can leave here and [facility].  She denies any SI/HI/AVH.  She continues to complain of sleepwalking, however, this has not been reported by the MHTs.  A: continue to monitor medication management and MD orders.  Safety checks completed every 15 minutes per protocol.  R: patient is receptive to staff.  Update 1600: Patient came to med window holding her head and moaning.  She stated, "If I don't get anything, I'm gonna lose it!  I'm craving so bad!"  Informed patient that she wasn't due for anything and she requested benedryl.  She was administered 25 mg benedryl for agitation.  She has been loud and monopolizing in group.  She was observed dancing in front of the nursing station while waiting for rec time. Patient came up to med window after her interview with [facility].  She was dancing and loudly yelling, "my baby goin to be ok!"  "I'm so happy!"  She requested ativan and I informed her she did not appear anxious and why did she need it.  She placed her head down and sobbed, "because I'm really really anxious!"

## 2013-08-12 NOTE — [facility] Group Notes (Signed)
[facility] LCSW Group Therapy  08/12/2013 3:15 PM  Type of Therapy:  Group Therapy  Participation Level:  Did Not Attend    Summary of Progress/Problems:  Group topic today consisted of a discussion around obstacles, specifically fear as an obstacle.  Members were asked to process and use the acronym False, Evidence, Appearing Real as it relates to their obstacles of achieving goals, sobriety, and a future.     Kathy SorrowHannah N Aaralyn [name] 08/12/2013, 3:15 PM

## 2013-08-12 NOTE — Progress Notes (Signed)
Adult Psychoeducational Group Note  Date:  08/12/2013 Time:  1:30 PM  Group Topic/Focus:  Wellness Toolbox:   The focus of this group is to discuss various aspects of wellness, balancing those aspects and exploring ways to increase the ability to experience wellness.  Patients will create a wellness toolbox for use upon discharge.  Participation Level:  Active  Participation Quality:  Intrusive, Resistant and Sharing  Affect:  Excited and Labile  Cognitive:  Alert and Oriented  Insight: Good and Improving  Engagement in Group:  Distracting, Engaged and Monopolizing  Modes of Intervention:  Clarification, Discussion, Education, Exploration, Limit-setting and Support  Additional Comments:  Pt was in and out of group. She left when people got long winded. Apart of her wellness toolbox includes meditation.  Reynolds BowlDorothy D Kampbell Holaway 08/12/2013, 1:30 PM

## 2013-08-12 NOTE — Progress Notes (Signed)
[facility] MD Progress Note  08/12/2013 6:34 PM [name]  MRN:  [id number] Subjective:  Still having a hard time. Mood is unstable. She is "having cravings" the vital signs are stable. She met with representatives from the [facility] and she will have a bed on the 28. She is high risk for relapse having a high risk pregnancy Diagnosis:   DSM5: Schizophrenia Disorders:  none Obsessive-Compulsive Disorders:  none Trauma-Stressor Disorders:  Posttraumatic Stress Disorder (309.81) Substance/Addictive Disorders:  Opioid Disorder - Severe (304.00), Cocaine Use Disorder Depressive Disorders:  Major Depressive Disorder - Mild (296.21) Total Time spent with patient: 30 minutes  Axis I: Mood Disorder NOS  ADL's:  Intact  Sleep: claims did not sleep well and claim she is sleep walking  Appetite:  Fair  Suicidal Ideation:  Plan:  denies Intent:  denies Means:  denies Homicidal Ideation:  Plan:  denies Intent:  denies Means:  denies AEB (as evidenced by):  Psychiatric Specialty Exam: Physical Exam  Review of Systems  Constitutional: Positive for malaise/fatigue.  HENT: Negative.   Eyes: Negative.   Respiratory: Negative.   Cardiovascular: Negative.   Gastrointestinal: Positive for nausea.  Genitourinary: Negative.   Musculoskeletal: Negative.   Skin: Negative.   Neurological: Negative.   Endo/Heme/Allergies: Negative.   Psychiatric/Behavioral: Positive for substance abuse. The patient is nervous/anxious and has insomnia.     Blood pressure 110/74, pulse 96, temperature 98 F (36.7 C), temperature source Oral, resp. rate 20, height 5' 2.5" (1.588 m), weight 69.174 kg (152 lb 8 oz), last menstrual period 02/28/2013, SpO2 98.00%.Body mass index is 27.43 kg/(m^2).  General Appearance: Fairly Groomed  Engineer, water::  Fair  Speech:  Clear and Coherent  Volume:  Increased  Mood:  Anxious and worried  Affect:  anxious, worried  Thought Process:  Coherent and Goal Directed   Orientation:  Full (Time, Place, and Person)  Thought Content:  symtpoms, worries, concerns  Suicidal Thoughts:  No  Homicidal Thoughts:  No  Memory:  Immediate;   Fair Recent;   Fair Remote;   Fair  Judgement:  Fair  Insight:  Present  Psychomotor Activity:  Restlessness  Concentration:  Fair  Recall:  [employer] of Knowledge:NA  Language: Fair  Akathisia:  No  Handed:  None  AIMS (if indicated):     Assets:  Desire for Improvement  Sleep:  Number of Hours: 6.25   Musculoskeletal: Strength & Muscle Tone: within normal limits Gait & Station: normal Patient leans: N/A  Current Medications: Current Facility-Administered Medications  Medication Dose Route Frequency Provider Last Rate Last Dose  . acetaminophen (TYLENOL) tablet 650 mg  650 mg Oral Q6H PRN Lurena Nida, NP   650 mg at 08/11/13 2147  . alum & mag hydroxide-simeth (MAALOX/MYLANTA) 200-200-20 MG/5ML suspension 30 mL  30 mL Oral Q4H PRN Laverle Hobby, PA-C      . buprenorphine (SUBUTEX) SL tablet 4 mg  4 mg Sublingual BID Nicholaus Bloom, MD   4 mg at 08/12/13 1653  . diphenhydrAMINE (BENADRYL) capsule 25 mg  25 mg Oral Q6H PRN Nicholaus Bloom, MD   25 mg at 08/12/13 1319  . diphenhydrAMINE (BENADRYL) capsule 50 mg  50 mg Oral QHS PRN Laverle Hobby, PA-C   50 mg at 08/11/13 2147  . folic acid (FOLVITE) tablet 1 mg  1 mg Oral Daily Nicholaus Bloom, MD   1 mg at 08/12/13 0803  . LORazepam (ATIVAN) tablet 0.5 mg  0.5  mg Oral Q8H PRN Nicholaus Bloom, MD   0.5 mg at 08/12/13 1654  . magnesium hydroxide (MILK OF MAGNESIA) suspension 30 mL  30 mL Oral Daily PRN Laverle Hobby, PA-C      . nicotine (NICODERM CQ - dosed in mg/24 hours) patch 14 mg  14 mg Transdermal Daily Encarnacion Slates, NP   14 mg at 08/12/13 0805  . prenatal multivitamin tablet 1 tablet  1 tablet Oral Q1200 Nicholaus Bloom, MD   1 tablet at 08/12/13 1205  . promethazine (PHENERGAN) tablet 25 mg  25 mg Oral Q6H PRN Nicholaus Bloom, MD   25 mg at 08/12/13 1654  .  traZODone (DESYREL) tablet 100 mg  100 mg Oral QHS PRN Nicholaus Bloom, MD        Lab Results: No results found for this or any previous visit (from the past 48 hour(s)).  Physical Findings: AIMS: Facial and Oral Movements Muscles of Facial Expression: None, normal Lips and Perioral Area: None, normal Jaw: None, normal Tongue: None, normal,Extremity Movements Upper (arms, wrists, hands, fingers): None, normal Lower (legs, knees, ankles, toes): None, normal, Trunk Movements Neck, shoulders, hips: None, normal, Overall Severity Severity of abnormal movements (highest score from questions above): None, normal Incapacitation due to abnormal movements: None, normal Patient's awareness of abnormal movements (rate only patient's report): No Awareness, Dental Status Current problems with teeth and/or dentures?: No Does patient usually wear dentures?: No  CIWA:    COWS:  COWS Total Score: 10  Treatment Plan Summary: Daily contact with patient to assess and evaluate symptoms and progress in treatment Medication management  Plan: Supportive approach/coping skills/relapse prevention           Increase the Trazodone to 100 mg HS (states she was using 150 mg)           CBT;mindfulness/ways to deal with the cravings  Medical Decision Making Problem Points:  Review of psycho-social stressors (1) Data Points:  Review of medication regiment & side effects (2) Review of new medications or change in dosage (2)  I certify that inpatient services furnished can reasonably be expected to improve the patient's condition.   Nicholaus Bloom 08/12/2013, 6:34 PM

## 2013-08-12 NOTE — [facility] Group Notes (Signed)
[facility]   08/12/2013 11:10 AM  Participation Quality:  Active and Monopolizing  Mood/Affect:  gamey, attention seeking  Depression Rating:  8  Anxiety Rating:  10  Thoughts of Suicide:  No Will you contract for safety?   Yes  Current AVH:  No  Plan for Discharge/Comments:  [name] was very loud and commanding attention in group. Labile in mood at times and attention seeking with sharing dramatic parts of her story and loss of her child. [facility] manage her substance abuse addiction.    Transportation Means: unknown at this time  Supports: unknown at this time.  Raye SorrowHannah N Fanta Wimberley

## 2013-08-12 NOTE — Progress Notes (Addendum)
LCSW met with patient 1:1 who asked to call her probation officer Dion Body in reference to her admission.  LCSW and patient called him via speaker phone and updated him of plan for [facility], with unknown DC date or admission date to [facility].  Patient continues to comply with plan to go to [facility] at DC.  Keon Leggett: PO [phone], ext. Leon Nail, MSW, [place] [phone]

## 2013-08-13 DIAGNOSIS — F320 Major depressive disorder, single episode, mild: Secondary | ICD-10-CM

## 2013-08-13 DIAGNOSIS — F39 Unspecified mood [affective] disorder: Secondary | ICD-10-CM

## 2013-08-13 DIAGNOSIS — F4310 Post-traumatic stress disorder, unspecified: Secondary | ICD-10-CM

## 2013-08-13 NOTE — Progress Notes (Signed)
Patient ID: Kathy ParsonsSarah D [name], female   DOB: 11/29/1985, 28 y.o.   MRN: [id number]  D: Pt approached the writer and requested phenergan, benadryl and ativan. Writer informed pt that it wasn't time for her phenergan. Writer redirected the pt and began talking about future plans. Pt informed the writer that she's "ready for discharge on Tuesday". Stated it's a 12 month program and she plans to finish school while she's there. Pt stated that after the program she was informed she would be offered a job.  A:  Support and encouragement was offered. 15 min checks continued for safety.  R: Pt remains safe.

## 2013-08-13 NOTE — Progress Notes (Signed)
[facility] Group Notes:  (Nursing/MHT/Case Management/Adjunct)  Date:  08/13/2013  Time:  8:00 p.m.   Type of Therapy:  Psychoeducational Skills  Participation Level:  Active  Participation Quality:  Appropriate  Affect:  Irritable  Cognitive:  Appropriate  Insight:  Good  Engagement in Group:  Engaged  Modes of Intervention:  Education  Summary of Progress/Problems: The patient spoke at great length in group this evening about how she wants to be sober for herself rather than for some other reason. She talked about the need to prepare herself for her upcoming pregnancy and staying healthy. Her goal for tomorrow is to work on being a better person and not allowing the smaller issues to get in her way.   Westly PamBenjamin S Syncere Eble 08/13/2013, 10:45 PM

## 2013-08-13 NOTE — Progress Notes (Addendum)
Patient ID: [name], female   DOB: Apr 24, 1986, 28 y.o.   MRN: [id number] D: Patient continues to complain of severe anxiety and nausea.  Patient has requested her ativan early.  She asked for it too early on night shift and again on day shift.  Patient requested it at 0730 when it was not due until 1130.  Patient's stated anxiety does not match her behavior during the day.  She also requests phenergan without any evidence of emisis and patient is eating regularly.  Patient is attending group; she monopolizes the feedback from her peers.  Patient has good insight regarding addiction and recovery, however, she has poor insight regarding her own.  She states she is "excited about going to [facility].  It was meant to be."  She denies any SI/HI/AVH.  A: continue to monitor medication management and MD orders.  Safety checks continued every 15 minutes per protocol.  R: patient is receptive to staff and exhibiting med seeking behaviors.  Update 1600: patient was asked to wear a robe down to the cafeteria due to her revealing clothing.  She became upset, however, she complied.

## 2013-08-13 NOTE — Progress Notes (Signed)
Recreation Therapy Notes  Animal-Assisted Activity/Therapy (AAA/T) Program Checklist/Progress Notes Patient Eligibility Criteria Checklist & Daily Group note for Rec Tx Intervention  Date: 04.21.2015 Time: 2:45pm Location: [address] Dayroom    AAA/T Program Assumption of Risk Form signed by Patient/ or Parent Legal Guardian yes  Patient is free of allergies or sever asthma yes  Patient reports no fear of animals yes  Patient reports no history of cruelty to animals yes   Patient understands his/her participation is voluntary yes  Behavioral Response: Did not attend.   Amiera Herzberg L Giovan Pinsky, LRT/CTRS         Madine Sarr L Tejasvi Brissett 08/13/2013 4:30 PM 

## 2013-08-13 NOTE — Progress Notes (Signed)
Adult Psychoeducational Group Note  Date:  08/13/2013 Time:  10:07 AM  Group Topic/Focus:  Recovery Goals:   The focus of this group is to identify appropriate goals for recovery and establish a plan to achieve them.  Participation Level:  Active  Participation Quality:  Appropriate, Attentive, Monopolizing, Sharing and Supportive  Affect:  Appropriate  Cognitive:  Alert and Appropriate  Insight: Appropriate and Good  Engagement in Group:  Engaged, Monopolizing and Supportive  Modes of Intervention:  Discussion, Education, Socialization and Support  Additional Comments:  Pt attended and participated in group.  Berlin HunDavid M Millianna Szymborski 08/13/2013, 10:07 AM

## 2013-08-13 NOTE — Progress Notes (Signed)
Patient ID: [name], female   DOB: [birth year], 28 y.o.   MRN: [id number] [facility] MD Progress Note  08/13/2013 2:20 PM KEILANY BURNETTE  MRN:  [id number]  Subjective:  [name] reports that she is still craving drugs. However, she adds that she met with the staff from the home where she will be moving to after discharge. Says it is a start for her in staying clean. [name] says she needs guidance in a controlled environment to stay well. Although, not sleeping well at night, [name] adds that she feels secure, knowing there is a place for her to go after discharge. She is participating in group sessions.  Diagnosis:   DSM5: Schizophrenia Disorders:  none Obsessive-Compulsive Disorders:  none Trauma-Stressor Disorders:  Posttraumatic Stress Disorder (309.81) Substance/Addictive Disorders:  Opioid Disorder - Severe (304.00), Cocaine Use Disorder Depressive Disorders:  Major Depressive Disorder - Mild (296.21) Total Time spent with patient: 30 minutes  Axis I: Mood Disorder NOS  ADL's:  Intact  Sleep: claims did not sleep well and claim she is sleep walking  Appetite:  Fair  Suicidal Ideation:  Plan:  denies Intent:  denies Means:  denies Homicidal Ideation:  Plan:  denies Intent:  denies Means:  denies AEB (as evidenced by):  Psychiatric Specialty Exam: Physical Exam  Review of Systems  Constitutional: Positive for malaise/fatigue.  HENT: Negative.   Eyes: Negative.   Respiratory: Negative.   Cardiovascular: Negative.   Gastrointestinal: Positive for nausea.  Genitourinary: Negative.   Musculoskeletal: Negative.   Skin: Negative.   Neurological: Negative.   Endo/Heme/Allergies: Negative.   Psychiatric/Behavioral: Positive for substance abuse. The patient is nervous/anxious and has insomnia.     Blood pressure 117/77, pulse 85, temperature 98.3 F (36.8 C), temperature source Oral, resp. rate 16, height 5' 2.5" (1.588 m), weight 69.174 kg (152 lb 8 oz), last menstrual period  02/28/2013, SpO2 98.00%.Body mass index is 27.43 kg/(m^2).  General Appearance: Fairly Groomed  Engineer, water::  Fair  Speech:  Clear and Coherent  Volume:  Increased  Mood:  Anxious and worried  Affect:  anxious, worried  Thought Process:  Coherent and Goal Directed  Orientation:  Full (Time, Place, and Person)  Thought Content:  symtpoms, worries, concerns  Suicidal Thoughts:  No  Homicidal Thoughts:  No  Memory:  Immediate;   Fair Recent;   Fair Remote;   Fair  Judgement:  Fair  Insight:  Present  Psychomotor Activity:  Restlessness  Concentration:  Fair  Recall:  [employer] of Knowledge:NA  Language: Fair  Akathisia:  No  Handed:  None  AIMS (if indicated):     Assets:  Desire for Improvement  Sleep:  Number of Hours: 6   Musculoskeletal: Strength & Muscle Tone: within normal limits Gait & Station: normal Patient leans: N/A  Current Medications: Current Facility-Administered Medications  Medication Dose Route Frequency Provider Last Rate Last Dose  . acetaminophen (TYLENOL) tablet 650 mg  650 mg Oral Q6H PRN Lurena Nida, NP   650 mg at 08/13/13 9449  . alum & mag hydroxide-simeth (MAALOX/MYLANTA) 200-200-20 MG/5ML suspension 30 mL  30 mL Oral Q4H PRN Laverle Hobby, PA-C      . buprenorphine (SUBUTEX) SL tablet 4 mg  4 mg Sublingual BID Nicholaus Bloom, MD   4 mg at 08/13/13 6759  . diphenhydrAMINE (BENADRYL) capsule 25 mg  25 mg Oral Q6H PRN Nicholaus Bloom, MD   25 mg at 08/13/13 0008  . diphenhydrAMINE (BENADRYL) capsule  50 mg  50 mg Oral QHS PRN Laverle Hobby, PA-C   50 mg at 08/12/13 2152  . folic acid (FOLVITE) tablet 1 mg  1 mg Oral Daily Nicholaus Bloom, MD   1 mg at 08/13/13 4536  . LORazepam (ATIVAN) tablet 0.5 mg  0.5 mg Oral Q8H PRN Nicholaus Bloom, MD   0.5 mg at 08/13/13 1132  . magnesium hydroxide (MILK OF MAGNESIA) suspension 30 mL  30 mL Oral Daily PRN Laverle Hobby, PA-C      . nicotine (NICODERM CQ - dosed in mg/24 hours) patch 14 mg  14 mg Transdermal  Daily Encarnacion Slates, NP   14 mg at 08/13/13 4680  . prenatal multivitamin tablet 1 tablet  1 tablet Oral Q1200 Nicholaus Bloom, MD   1 tablet at 08/13/13 1132  . promethazine (PHENERGAN) tablet 25 mg  25 mg Oral Q6H PRN Nicholaus Bloom, MD   25 mg at 08/13/13 0631  . traZODone (DESYREL) tablet 100 mg  100 mg Oral QHS PRN Nicholaus Bloom, MD   100 mg at 08/12/13 2151    Lab Results: No results found for this or any previous visit (from the past 48 hour(s)).  Physical Findings: AIMS: Facial and Oral Movements Muscles of Facial Expression: None, normal Lips and Perioral Area: None, normal Jaw: None, normal Tongue: None, normal,Extremity Movements Upper (arms, wrists, hands, fingers): None, normal Lower (legs, knees, ankles, toes): None, normal, Trunk Movements Neck, shoulders, hips: None, normal, Overall Severity Severity of abnormal movements (highest score from questions above): None, normal Incapacitation due to abnormal movements: None, normal Patient's awareness of abnormal movements (rate only patient's report): No Awareness, Dental Status Current problems with teeth and/or dentures?: No Does patient usually wear dentures?: No  CIWA:    COWS:  COWS Total Score: 10  Treatment Plan Summary: Daily contact with patient to assess and evaluate symptoms and progress in treatment Medication management  Plan: Supportive approach/coping skills/relapse prevention Continue Trazodone at 100 mg HS for insomnia CBT;mindfulness/ways to deal with the cravings. Discharge plans in progress.  Medical Decision Making Problem Points:  Review of psycho-social stressors (1) Data Points:  Review of medication regiment & side effects (2) Review of new medications or change in dosage (2)  I certify that inpatient services furnished can reasonably be expected to improve the patient's condition.   Encarnacion Slates, PMHNP-BC 08/13/2013, 2:20 PM

## 2013-08-13 NOTE — Clinical Social Work Note (Signed)
CSW met with pt individually today.  Pt is in a positive mood today.  Pt states that the visit with staff from [facility] went well yesterday.  Pt states that she is encouraged and excited about her move in date next week.  Discussed pt waiting here before moving in, as pt has tried d/c home first and failed.  Pt discussed having temptations and negative influences at home that encourage her to relapse.  Pt is accepting of the d/c plan and looks forward to going to the treatment center.  No further needs voiced by pt at this time.    Regan Lemming, LCSW 08/13/2013  1:03 PM

## 2013-08-13 NOTE — Progress Notes (Signed)
Patient ID: [name], female   DOB: 1985-11-26, 28 y.o.   MRN: [id number]  D: Pt was blunt and short with her answers when speaking to the writer. Writer asked pt about her day and pt stated, "alright", and continued to walk away. Writer attempted to engage pt in more conversation, stating that she(writer) was informed that the p pt was accepted back into her initial program of choice. Pt stated, "yeah" and continued to walk away.   A:  Support and encouragement was offered. 15 min checks continued for safety.  R: Pt remains safe.

## 2013-08-13 NOTE — [facility] Group Notes (Signed)
[facility] LCSW Group Therapy  08/13/2013 2:00 PM  Type of Therapy:  Group Therapy  Participation Level:  Did Not Attend  Patient asleep in her room.   Evlyn CourierHannah N Valkyrie [name] 08/13/2013, 2:00 PM

## 2013-08-13 NOTE — Progress Notes (Signed)
Patient ID: [name], female   DOB: [birth year], 28 y.o.   MRN: [id number]  At 2150 pt stated, "I'll be back at 0100 to get my ativan and phenergan". Writer asked the pt how she knows she's gonna be nauseous or anxious at 0100. Writer informed pt that they would deal with it at that time.   At apprx 2400, pt came and requested ativan and phenergan. Writer reminded pt that she already knew it would be 0100 before getting meds. Pt stated she needed something for sleep.Writer gave pt prn benadryl. Pt stated "I threw up". However when informed that the writer needed to see it, pt stated "well everybody else just gives it to me." Pt stated, "when I wake up in the morning I'm gonna want it".

## 2013-08-14 DIAGNOSIS — F1220 Cannabis dependence, uncomplicated: Secondary | ICD-10-CM

## 2013-08-14 DIAGNOSIS — F1120 Opioid dependence, uncomplicated: Secondary | ICD-10-CM

## 2013-08-14 MED ORDER — BUPRENORPHINE HCL 2 MG SL SUBL: 4.0000 mg | SUBLINGUAL_TABLET | Freq: Two times a day (BID) | SUBLINGUAL | Status: DC

## 2013-08-14 MED ORDER — TRAZODONE HCL 100 MG PO TABS: 100.0000 mg | ORAL_TABLET | Freq: Every evening | ORAL | Status: DC | PRN

## 2013-08-14 MED ORDER — PRENATAL MULTIVITAMIN CH: 1.0000 | ORAL_TABLET | Freq: Every day | ORAL | Status: DC

## 2013-08-14 NOTE — Progress Notes (Signed)
Nutrition Follow-up  Patient on a regular diet with tid snacks.  Intake remains very good.  Will continue to provide a high protein snack every night.  Oran ReinLaura Carlinda Ohlson, RD, LDN Clinical Inpatient Dietitian Pager:  [phone] Weekend and after hours pager:  [phone]

## 2013-08-14 NOTE — Progress Notes (Signed)
[facility]  She denied any S/I H/I or A/V hallucinations.    She was given f/u appointment, rx, sample medications, hotline info booklet.  She voiced understanding to all instructions provided.  She declined the need for smoking cessation materials.  She removed her nicotine patch before she left.

## 2013-08-14 NOTE — [facility] Suicide Risk Assessment (Signed)
Suicide Risk Assessment  Discharge Assessment     Demographic Factors:  NA  Total Time spent with patient: 45 minutes  Psychiatric Specialty Exam:     Blood pressure 105/60, pulse 94, temperature 97.9 F (36.6 C), temperature source Oral, resp. rate 16, height 5' 2.5" (1.588 m), weight 69.174 kg (152 lb 8 oz), last menstrual period 02/28/2013, SpO2 98.00%.Body mass index is 27.43 kg/(m^2).  General Appearance: Fairly Groomed  Patent attorneyye Contact::  Fair  Speech:  Clear and Coherent  Volume:  Normal  Mood:  Anxious  Affect:  Appropriate  Thought Process:  Coherent and Goal Directed  Orientation:  Full (Time, Place, and Person)  Thought Content:  worries, concerns  Suicidal Thoughts:  No  Homicidal Thoughts:  No  Memory:  Immediate;   Fair Recent;   Fair Remote;   Fair  Judgement:  Fair  Insight:  Present and Shallow  Psychomotor Activity:  Normal  Concentration:  Fair  Recall:  [employer]  Fund of Knowledge:NA  Language: Fair  Akathisia:  No  Handed:    AIMS (if indicated):     Assets:  Desire for Improvement  Sleep:  Number of Hours: 4.75    Musculoskeletal: Strength & Muscle Tone: within normal limits Gait & Station: normal Patient leans: N/A   Mental Status Per Nursing Assessment::   On Admission:  NA  Current Mental Status by Physician: In full contact with reality. There are no active S/S of withdrawal. She is going to be admitted to the [facility] in [place] for addicted pregnant women.   Loss Factors: NA  Historical Factors: NA  Risk Reduction Factors:   Pregnancy  Continued Clinical Symptoms:  Alcohol/Substance Abuse/Dependencies  Cognitive Features That Contribute To Risk:  Closed-mindedness Polarized thinking Thought constriction (tunnel vision)    Suicide Risk:  Minimal: No identifiable suicidal ideation.  Patients presenting with no risk factors but with morbid ruminations; may be classified as minimal risk based on the severity of the  depressive symptoms  Discharge Diagnoses:   AXIS I:  Opioid Dependence, Cocaine Abuse, Marijuana Abuse, PTSD AXIS II:  No diagnosis AXIS III:   Past Medical History  Diagnosis Date  . STD (female)     hx of chlamydia and gonorrhea  . History of PID   . Endometriosis   . Glaucoma   . Ovarian cyst   . Bartholin cyst   . Headache(784.0)   . Infection     UTI  . Vaginal Pap smear, abnormal     has not followed up   AXIS IV:  other psychosocial or environmental problems AXIS V:  61-70 mild symptoms  Plan Of Care/Follow-up recommendations:  Activity:  as tolerated Diet:  regular Follow up [facility] Is patient on multiple antipsychotic therapies at discharge:  No   Has Patient had three or more failed trials of antipsychotic monotherapy by history:  No  Recommended Plan for Multiple Antipsychotic Therapies: NA    Kathy FeeIrving A [name] 08/14/2013, 11:04 AM
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
## 2013-08-14 NOTE — [facility] Group Notes (Signed)
[facility] Dis[facility] Note   08/14/2013 8:45 AM  Participation Quality:  Alert, Appropriate and Oriented  Mood/Affect:  Calm, Bright  Depression Rating: 0   Anxiety Rating:  0  Thoughts of Suicide:  Pt denies SI/HI  Will you contract for safety?   Yes  Current AVH:  Pt denies  Plan for Discharge/Comments:  Pt attended discharge planning group and actively participated in group.  CSW provided pt with today's workbook.  Pt reports feeling great today and excited about her d/c plan.  [facility]-12 [facility] for [facility] women.  Pt states that her meeting with the [facility] staff and reports this went well.  Pt is working on [facility] [place].  Pt wants her baby's father to take her there to say good bye.  Pt is scheduled to go next Tuesday, 4/28 and will stay here to prevent relapse. No further needs voiced by pt at this time.    Transportation Means: Pt reports access to transportation  Supports: No supports mentioned at this time  Reyes IvanChelsea Horton, LCSW 08/14/2013 9:51 AM

## 2013-08-14 NOTE — Progress Notes (Addendum)
[facility] Discharge Plan :  Will you be returning to the same living situation after discharge: No. At discharge, do you have transportation home?:Yes,  [facility] will pick pt at 12:00 pm today to transport pt there Do you have the ability to pay for your medications:Yes,  providing pt with 1 week supply of meds to carry her to the doctor's appointment at [facility], they will then provide pt with meds  Release of information consent forms completed and in the chart;  Patient's signature needed at discharge.  Patient to Follow up at: Follow-up Information   Follow up with [facility] On 08/14/2013. (For 9-12 month inpatient treatment.  [facility] will pick you up today at 12:00 pm and transport you there.)    Contact information:   [address] [place], [place] [address] Phone: [phone] Fax: [phone]      Patient denies SI/HI:   Yes,  denies SI/HI    Safety Planning and Suicide Prevention discussed:  Yes,  discussed with pt.  N/A to contact family/friend due to no SI on admission.   [facility] called to inform CSW that they were able to take pt today, sooner then the planned 4/28 admission date.  Pt and MD okay with this plan.    [name] 08/14/2013, 10:50 AM

## 2013-08-19 NOTE — Progress Notes (Signed)
Patient Discharge Instructions:  After Visit Summary (AVS):   Faxed to:  08/19/13 Discharge Summary Note:   Faxed to:  08/19/13 Psychiatric Admission Assessment Note:   Faxed to:  08/19/13 Suicide Risk Assessment - Discharge Assessment:   Faxed to:  08/19/13 Faxed/Sent to the Next Level Care provider:  08/19/13 Faxed to [facility] @ [phone]  Jerelene ReddenSheena E Wishram, 08/19/2013, 2:45 PM

## 2013-10-15 ENCOUNTER — Encounter (HOSPITAL_COMMUNITY): Payer: Self-pay

## 2013-12-07 ENCOUNTER — Inpatient Hospital Stay (HOSPITAL_COMMUNITY)
Admission: AD | Admit: 2013-12-07 | Discharge: 2013-12-07 | Disposition: A | Discharge status: Home / Self Care | Payer: Medicaid Other | Source: Ambulatory Visit | Attending: Obstetrics and Gynecology | Admitting: Obstetrics and Gynecology

## 2013-12-07 ENCOUNTER — Encounter (HOSPITAL_COMMUNITY): Payer: Self-pay

## 2013-12-07 DIAGNOSIS — O99330 Smoking (tobacco) complicating pregnancy, unspecified trimester: Secondary | ICD-10-CM | POA: Diagnosis not present

## 2013-12-07 DIAGNOSIS — F1210 Cannabis abuse, uncomplicated: Secondary | ICD-10-CM | POA: Diagnosis not present

## 2013-12-07 DIAGNOSIS — O99891 Other specified diseases and conditions complicating pregnancy: Secondary | ICD-10-CM | POA: Insufficient documentation

## 2013-12-07 DIAGNOSIS — K529 Noninfective gastroenteritis and colitis, unspecified: Secondary | ICD-10-CM

## 2013-12-07 DIAGNOSIS — K5289 Other specified noninfective gastroenteritis and colitis: Secondary | ICD-10-CM | POA: Insufficient documentation

## 2013-12-07 DIAGNOSIS — O99340 Other mental disorders complicating pregnancy, unspecified trimester: Secondary | ICD-10-CM | POA: Insufficient documentation

## 2013-12-07 DIAGNOSIS — O9989 Other specified diseases and conditions complicating pregnancy, childbirth and the puerperium: Principal | ICD-10-CM

## 2013-12-07 DIAGNOSIS — O479 False labor, unspecified: Secondary | ICD-10-CM | POA: Diagnosis present

## 2013-12-07 LAB — COMPREHENSIVE METABOLIC PANEL
ALBUMIN: 2.7 g/dL — AB (ref 3.5–5.2)
ALT: 19 U/L (ref 0–35)
AST: 37 U/L (ref 0–37)
Alkaline Phosphatase: 145 U/L — ABNORMAL HIGH (ref 39–117)
Anion gap: 12 (ref 5–15)
BUN: 8 mg/dL (ref 6–23)
CALCIUM: 8.5 mg/dL (ref 8.4–10.5)
CHLORIDE: 106 meq/L (ref 96–112)
CO2: 22 mEq/L (ref 19–32)
CREATININE: 0.57 mg/dL (ref 0.50–1.10)
GFR calc Af Amer: >90 mL/min (ref >90)
GFR calc non Af Amer: >90 mL/min (ref >90)
Glucose, Bld: 92 mg/dL (ref 70–99)
Potassium: 4.5 mEq/L (ref 3.7–5.3)
Sodium: 140 mEq/L (ref 137–147)
Total Protein: 6.2 g/dL (ref 6.0–8.3)

## 2013-12-07 LAB — RAPID URINE DRUG SCREEN, HOSP PERFORMED
Amphetamines: NOT DETECTED
BARBITURATES: NOT DETECTED
BENZODIAZEPINES: NOT DETECTED
COCAINE: NOT DETECTED
OPIATES: NOT DETECTED
Tetrahydrocannabinol: POSITIVE — AB

## 2013-12-07 LAB — CBC WITH DIFFERENTIAL/PLATELET
BASOS PCT: 0 % (ref 0–1)
Basophils Absolute: 0 10*3/uL (ref 0.0–0.1)
EOS PCT: 1 % (ref 0–5)
Eosinophils Absolute: 0.1 10*3/uL (ref 0.0–0.7)
HEMATOCRIT: 34.1 % — AB (ref 36.0–46.0)
HEMOGLOBIN: 12.1 g/dL (ref 12.0–15.0)
Lymphocytes Relative: 11 % — ABNORMAL LOW (ref 12–46)
Lymphs Abs: 1.1 10*3/uL (ref 0.7–4.0)
MCH: 32.3 pg (ref 26.0–34.0)
MCHC: 35.5 g/dL (ref 30.0–36.0)
MCV: 90.9 fL (ref 78.0–100.0)
MONO ABS: 1 10*3/uL (ref 0.1–1.0)
MONOS PCT: 10 % (ref 3–12)
Neutro Abs: 7.5 10*3/uL (ref 1.7–7.7)
Neutrophils Relative %: 78 % — ABNORMAL HIGH (ref 43–77)
Platelets: 163 10*3/uL (ref 150–400)
RBC: 3.75 MIL/uL — ABNORMAL LOW (ref 3.87–5.11)
RDW: 14 % (ref 11.5–15.5)
WBC: 9.6 10*3/uL (ref 4.0–10.5)

## 2013-12-07 LAB — URINALYSIS, ROUTINE W REFLEX MICROSCOPIC
BILIRUBIN URINE: NEGATIVE
Glucose, UA: NEGATIVE mg/dL
Hgb urine dipstick: NEGATIVE
Ketones, ur: 15 mg/dL — AB
NITRITE: NEGATIVE
PH: 6 (ref 5.0–8.0)
Protein, ur: NEGATIVE mg/dL
Specific Gravity, Urine: 1.015 (ref 1.005–1.030)
UROBILINOGEN UA: 0.2 mg/dL (ref 0.0–1.0)

## 2013-12-07 LAB — URINE MICROSCOPIC-ADD ON

## 2013-12-07 LAB — OB RESULTS CONSOLE GBS: GBS: NEGATIVE

## 2013-12-07 LAB — GROUP B STREP BY PCR: Group B strep by PCR: NEGATIVE

## 2013-12-07 MED ORDER — ONDANSETRON HCL 4 MG/2ML IJ SOLN
4.0000 mg | Freq: Once | INTRAMUSCULAR | Status: AC
  Administered 2013-12-07: 4 mg via INTRAVENOUS
  Filled 2013-12-07: qty 2

## 2013-12-07 MED ORDER — ONDANSETRON HCL 4 MG PO TABS: 4.0000 mg | ORAL_TABLET | Freq: Three times a day (TID) | ORAL | Status: DC | PRN

## 2013-12-07 MED ORDER — LACTATED RINGERS IV BOLUS (SEPSIS)
1000.0000 mL | Freq: Once | INTRAVENOUS | Status: AC
  Administered 2013-12-07: 1000 mL via INTRAVENOUS

## 2013-12-07 NOTE — MAU Provider Note (Signed)
First Provider Initiated Contact with Patient 12/07/13 [phone]      Chief Complaint:  Labor Eval and Emesis During Pregnancy   [name] is  28 y.o. [id number] at 39.3wks by wk U/S presents complaining of Labor Eval and Emesis During Pregnancy .  She states none contractions are associated with none vaginal bleeding, intact membranes, along with active fetal movement. Pt. Reports that she had acute onset of nausea / vomiting and diarrhea around three hours prior to her arrival. She denies fever or chills. She reports eating beef stew at home, and that she has not eaten out at all recently. She says that she has had non-bloody, watery diarrhea x 4 since 2 am. She has had multiple episodes of nausea / vomiting as well. She says vomitus is clear to yellowish brown in nature. She denies back pain, abdominal pain, dysuria, hematuria, vaginal itching or burning. She denies VB, LOF. She has +FM. She says that she was seen by Dr. Tamela [name] previously for her first obstetrical visit, but has since received all of her prenatal care at [facility] in [place]. Additionally, she has a strong history of drug abuse including cocaine, heroin / opioids, and marijuana. She has required psychiatric admission for suicidal ideation and drug detoxification as recently as 07/2013. She was discharged for outpatient detox and long term maintenance therapy with sublingual suboxone which she receives from [facility] clinic in [place]. She says that she picks up her suboxone once per week on Wednesday. She currently endorses marijuana use within the last 24 hours, however she adamantly denies the use of cocaine or heroin. She says that her last use of heroin was 1 year ago, and Cocaine was 6 months ago. She does not intend to continue prenatal care [facility] in [place] at this point.   Obstetrical/Gynecological History: OB History   Grav Para Term Preterm Abortions TAB SAB Ect Mult Living   4 1 0 1 2 0 2 0 0 1      Past  Medical History: Past Medical History  Diagnosis Date  . STD (female)     hx of chlamydia and gonorrhea  . History of PID   . Endometriosis   . Glaucoma   . Ovarian cyst   . Bartholin cyst   . Headache(784.0)   . Infection     UTI  . Vaginal Pap smear, abnormal     has not followed up    Past Surgical History: Past Surgical History  Procedure Laterality Date  . Fracture surgery      left leg  . Dilation and curettage of uterus    . Laparoscopy      Family History: Family History  Problem Relation Age of Onset  . Anesthesia problems Neg Hx   . Hypotension Neg Hx   . Malignant hyperthermia Neg Hx   . Pseudochol deficiency Neg Hx   . Alcohol abuse Neg Hx   . Diabetes Mother   . Hypertension Maternal Grandmother   . Heart disease Maternal Grandmother     great grandma    Social History: History  Substance Use Topics  . Smoking status: Current Every Day Smoker -- 0.25 packs/day for 7 years    Types: Cigarettes  . Smokeless tobacco: Never Used  . Alcohol Use: No     Comment: Denies ETOH use    Allergies:  Allergies  Allergen Reactions  . Acyclovir And Related Swelling  . Darvocet [Propoxyphene N-Acetaminophen] Hives  . Doxycycline Swelling  .  Flexeril [Cyclobenzaprine] Nausea And Vomiting  . Metoclopramide Hives  . Naproxen Hives    Pt is able to take ibuprofen without problems  . Latex Rash  . Tramadol Rash    Meds:  Prescriptions prior to admission  Medication Sig Dispense Refill  . buprenorphine (SUBUTEX) 2 MG SUBL SL tablet Place 2 tablets (4 mg total) under the tongue 2 (two) times daily at 10 AM and 5 PM. (See hand-written prescription by Dr. Dub [name]): For opioid detox  28 tablet  0  . Prenatal Vit-Fe Fumarate-FA (PRENATAL MULTIVITAMIN) TABS tablet Take 1 tablet by mouth daily at 12 noon. :Prenatal Vitamin  30 tablet  0  . traZODone (DESYREL) 100 MG tablet Take 1 tablet (100 mg total) by mouth at bedtime as needed for sleep.  30 tablet  0    Review  of Systems -  Per HPI above.     Physical Exam  Blood pressure 122/78, pulse 75, temperature 97.5 F (36.4 C), temperature source Oral, resp. rate 18, last menstrual period 02/28/2013, SpO2 100.00%. GENERAL: Well-developed, well-nourished female in no acute distress.  LUNGS: Clear to auscultation bilaterally.  HEART: Regular rate and rhythm. ABDOMEN: Soft, nontender, nondistended, gravid.  EXTREMITIES: Nontender, no edema, 2+ distal pulses. Grossly neurologically intact CERVICAL EXAM: 2.5, 60, -3 Presentation: cephalic FHT:  Baseline rate 140 bpm   Variability moderate  Accelerations present   Decelerations none Contractions: None   Labs: Results for orders placed during the hospital encounter of 12/07/13 (from the past 24 hour(s))  URINALYSIS, ROUTINE W REFLEX MICROSCOPIC   Collection Time    12/07/13  4:30 AM      Result Value Ref Range   Color, Urine YELLOW  YELLOW   APPearance CLEAR  CLEAR   Specific Gravity, Urine 1.015  1.005 - 1.030   pH 6.0  5.0 - 8.0   Glucose, UA NEGATIVE  NEGATIVE mg/dL   Hgb urine dipstick NEGATIVE  NEGATIVE   Bilirubin Urine NEGATIVE  NEGATIVE   Ketones, ur 15 (*) NEGATIVE mg/dL   Protein, ur NEGATIVE  NEGATIVE mg/dL   Urobilinogen, UA 0.2  0.0 - 1.0 mg/dL   Nitrite NEGATIVE  NEGATIVE   Leukocytes, UA SMALL (*) NEGATIVE  URINE MICROSCOPIC-ADD ON   Collection Time    12/07/13  4:30 AM      Result Value Ref Range   Squamous Epithelial / LPF MANY (*) RARE   WBC, UA 0-2  <3 WBC/hpf   RBC / HPF 0-2  <3 RBC/hpf   Bacteria, UA RARE  RARE   Imaging Studies:  No results found.  Assessment: [name] is  28 y.o. [phone] at 5915w2d presents with Gastroenteritis.  Plan: 1. Gastroenteritis - Fluid bolus - Zofran for nausea - Tolerating PO, able to keep solids and liquids down at this point.  - Safe for discharge to home with PO hydration. - Symptomatic management - CBC, CMP WNL, U/A negative.  - Return for evaluation if symptoms  continue or worsen, or for any other concern.   2. History of Drug Abuse - UDS positive for Marijuana at this time.   - Advised patient against using Heroin, cocaine, or marijuana.  - encouraged continued follow up with outpatient rehab.  - Advised patient to return to be evaluated if she begins to withdraw, and went over signs of overdose and importance of family members / friends being able to identify and bring her for treatment if she were to overdose.   3. IUP @ 39.3  per 8Wk. Ultrasound - Pt. With poor follow up to [facility] at this admission given likelihood of return for delivery.  - Records request from [facility] - Will schedule for appointment in clinic.  - Pt. Instructed with signs of labor, and to return for evaluation for any vaginal bleeding, LOF, or Contractions.   [place], Hillery Hunter 8/15/20155:23 AM  I have seen and examined this patient and I agree with the above. Cam Hai CNM 9:03 AM 12/07/2013

## 2013-12-07 NOTE — MAU Note (Signed)
Pt c/o contractions since yesterday. N/V/D began tonight about 3 hours ago. Denies being around anyone sick. Denies LOF, vag bleeding. +FM

## 2013-12-07 NOTE — Discharge Instructions (Signed)
Narcotic Overdose A narcotic overdose is the misuse or overuse of a narcotic drug. A narcotic overdose can make you pass out and stop breathing. If you are not treated right away, this can cause permanent brain damage or stop your heart. Medicine may be given to reverse the effects of an overdose. If so, this medicine may bring on withdrawal symptoms. The symptoms may be abdominal cramps, throwing up (vomiting), sweating, chills, and nervousness. Injecting narcotics can cause more problems than just an overdose. AIDS, hepatitis, and other very serious infections are transmitted by sharing needles and syringes. If you decide to quit using, there are medicines which can help you through the withdrawal period. Trying to quit all at once on your own can be uncomfortable, but not life-threatening. Call your caregiver, Narcotics Anonymous, or any drug and alcohol treatment program for further help.  Document Released: 05/19/2004 Document Revised: 07/04/2011 Document Reviewed: 03/13/2009 [facility] Patient Information 2015 [place], [place]. This information is not intended to replace advice given to you by your health care provider. Make sure you discuss any questions you have with your health care provider. Third Trimester of Pregnancy The third trimester is from week 29 through week 42, months 7 through 9. The third trimester is a time when the fetus is growing rapidly. At the end of the ninth month, the fetus is about 20 inches in length and weighs 6-10 pounds.  BODY CHANGES Your body goes through many changes during pregnancy. The changes vary from woman to woman.   Your weight will continue to increase. You can expect to gain 25-35 pounds (11-16 kg) by the end of the pregnancy.  You may begin to get stretch marks on your hips, abdomen, and breasts.  You may urinate more often because the fetus is moving lower into your pelvis and pressing on your bladder.  You may develop or continue to have heartburn as  a result of your pregnancy.  You may develop constipation because certain hormones are causing the muscles that push waste through your intestines to slow down.  You may develop hemorrhoids or swollen, bulging veins (varicose veins).  You may have pelvic pain because of the weight gain and pregnancy hormones relaxing your joints between the bones in your pelvis. Backaches may result from overexertion of the muscles supporting your posture.  You may have changes in your hair. These can include thickening of your hair, rapid growth, and changes in texture. Some women also have hair loss during or after pregnancy, or hair that feels dry or thin. Your hair will most likely return to normal after your baby is born.  Your breasts will continue to grow and be tender. A yellow discharge may leak from your breasts called colostrum.  Your belly button may stick out.  You may feel short of breath because of your expanding uterus.  You may notice the fetus "dropping," or moving lower in your abdomen.  You may have a bloody mucus discharge. This usually occurs a few days to a week before labor begins.  Your cervix becomes thin and soft (effaced) near your due date. WHAT TO EXPECT AT YOUR PRENATAL EXAMS  You will have prenatal exams every 2 weeks until week 36. Then, you will have weekly prenatal exams. During a routine prenatal visit:  You will be weighed to make sure you and the fetus are growing normally.  Your blood pressure is taken.  Your abdomen will be measured to track your baby's growth.  The fetal heartbeat will  be listened to.  Any test results from the previous visit will be discussed.  You may have a cervical check near your due date to see if you have effaced. At around 36 weeks, your caregiver will check your cervix. At the same time, your caregiver will also perform a test on the secretions of the vaginal tissue. This test is to determine if a type of bacteria, Group B  streptococcus, is present. Your caregiver will explain this further. Your caregiver may ask you:  What your birth plan is.  How you are feeling.  If you are feeling the baby move.  If you have had any abnormal symptoms, such as leaking fluid, bleeding, severe headaches, or abdominal cramping.  If you have any questions. Other tests or screenings that may be performed during your third trimester include:  Blood tests that check for low iron levels (anemia).  Fetal testing to check the health, activity level, and growth of the fetus. Testing is done if you have certain medical conditions or if there are problems during the pregnancy. FALSE LABOR You may feel small, irregular contractions that eventually go away. These are called Braxton Hicks contractions, or false labor. Contractions may last for hours, days, or even weeks before true labor sets in. If contractions come at regular intervals, intensify, or become painful, it is best to be seen by your caregiver.  SIGNS OF LABOR   Menstrual-like cramps.  Contractions that are 5 minutes apart or less.  Contractions that start on the top of the uterus and spread down to the lower abdomen and back.  A sense of increased pelvic pressure or back pain.  A watery or bloody mucus discharge that comes from the vagina. If you have any of these signs before the 37th week of pregnancy, call your caregiver right away. You need to go to the hospital to get checked immediately. HOME CARE INSTRUCTIONS   Avoid all smoking, herbs, alcohol, and unprescribed drugs. These chemicals affect the formation and growth of the baby.  Follow your caregiver's instructions regarding medicine use. There are medicines that are either safe or unsafe to take during pregnancy.  Exercise only as directed by your caregiver. Experiencing uterine cramps is a good sign to stop exercising.  Continue to eat regular, healthy meals.  Wear a good support bra for breast  tenderness.  Do not use hot tubs, steam rooms, or saunas.  Wear your seat belt at all times when driving.  Avoid raw meat, uncooked cheese, cat litter boxes, and soil used by cats. These carry germs that can cause birth defects in the baby.  Take your prenatal vitamins.  Try taking a stool softener (if your caregiver approves) if you develop constipation. Eat more high-fiber foods, such as fresh vegetables or fruit and whole grains. Drink plenty of fluids to keep your urine clear or pale yellow.  Take warm sitz baths to soothe any pain or discomfort caused by hemorrhoids. Use hemorrhoid cream if your caregiver approves.  If you develop varicose veins, wear support hose. Elevate your feet for 15 minutes, 3-4 times a day. Limit salt in your diet.  Avoid heavy lifting, wear low heal shoes, and practice good posture.  Rest a lot with your legs elevated if you have leg cramps or low back pain.  Visit your dentist if you have not gone during your pregnancy. Use a soft toothbrush to brush your teeth and be gentle when you floss.  A sexual relationship may be continued unless  your caregiver directs you otherwise.  Do not travel far distances unless it is absolutely necessary and only with the approval of your caregiver.  Take prenatal classes to understand, practice, and ask questions about the labor and delivery.  Make a trial run to the hospital.  Pack your hospital bag.  Prepare the baby's nursery.  Continue to go to all your prenatal visits as directed by your caregiver. SEEK MEDICAL CARE IF:  You are unsure if you are in labor or if your water has broken.  You have dizziness.  You have mild pelvic cramps, pelvic pressure, or nagging pain in your abdominal area.  You have persistent nausea, vomiting, or diarrhea.  You have a bad smelling vaginal discharge.  You have pain with urination. SEEK IMMEDIATE MEDICAL CARE IF:   You have a fever.  You are leaking fluid from  your vagina.  You have spotting or bleeding from your vagina.  You have severe abdominal cramping or pain.  You have rapid weight loss or gain.  You have shortness of breath with chest pain.  You notice sudden or extreme swelling of your face, hands, ankles, feet, or legs.  You have not felt your baby move in over an hour.  You have severe headaches that do not go away with medicine.  You have vision changes. Document Released: 04/05/2001 Document Revised: 04/16/2013 Document Reviewed: 06/12/2012 [facility] Patient Information 2015 [place], [place]. This information is not intended to replace advice given to you by your health care provider. Make sure you discuss any questions you have with your health care provider. Viral Gastroenteritis Viral gastroenteritis is also known as stomach flu. This condition affects the stomach and intestinal tract. It can cause sudden diarrhea and vomiting. The illness typically lasts 3 to 8 days. Most people develop an immune response that eventually gets rid of the virus. While this natural response develops, the virus can make you quite ill. CAUSES  Many different viruses can cause gastroenteritis, such as rotavirus or noroviruses. You can catch one of these viruses by consuming contaminated food or water. You may also catch a virus by sharing utensils or other personal items with an infected person or by touching a contaminated surface. SYMPTOMS  The most common symptoms are diarrhea and vomiting. These problems can cause a severe loss of body fluids (dehydration) and a body salt (electrolyte) imbalance. Other symptoms may include:  Fever.  Headache.  Fatigue.  Abdominal pain. DIAGNOSIS  Your caregiver can usually diagnose viral gastroenteritis based on your symptoms and a physical exam. A stool sample may also be taken to test for the presence of viruses or other infections. TREATMENT  This illness typically goes away on its own. Treatments are  aimed at rehydration. The most serious cases of viral gastroenteritis involve vomiting so severely that you are not able to keep fluids down. In these cases, fluids must be given through an intravenous line (IV). HOME CARE INSTRUCTIONS   Drink enough fluids to keep your urine clear or pale yellow. Drink small amounts of fluids frequently and increase the amounts as tolerated.  Ask your caregiver for specific rehydration instructions.  Avoid:  Foods high in sugar.  Alcohol.  Carbonated drinks.  Tobacco.  Juice.  Caffeine drinks.  Extremely hot or cold fluids.  Fatty, greasy foods.  Too much intake of anything at one time.  Dairy products until 24 to 48 hours after diarrhea stops.  You may consume probiotics. Probiotics are active cultures of beneficial bacteria. They  may lessen the amount and number of diarrheal stools in adults. Probiotics can be found in yogurt with active cultures and in supplements.  Wash your hands well to avoid spreading the virus.  Only take over-the-counter or prescription medicines for pain, discomfort, or fever as directed by your caregiver. Do not give aspirin to children. Antidiarrheal medicines are not recommended.  Ask your caregiver if you should continue to take your regular prescribed and over-the-counter medicines.  Keep all follow-up appointments as directed by your caregiver. SEEK IMMEDIATE MEDICAL CARE IF:   You are unable to keep fluids down.  You do not urinate at least once every 6 to 8 hours.  You develop shortness of breath.  You notice blood in your stool or vomit. This may look like coffee grounds.  You have abdominal pain that increases or is concentrated in one small area (localized).  You have persistent vomiting or diarrhea.  You have a fever.  The patient is a child younger than 3 months, and he or she has a fever.  The patient is a child older than 3 months, and he or she has a fever and persistent  symptoms.  The patient is a child older than 3 months, and he or she has a fever and symptoms suddenly get worse.  The patient is a baby, and he or she has no tears when crying. MAKE SURE YOU:   Understand these instructions.  Will watch your condition.  Will get help right away if you are not doing well or get worse. Document Released: 04/11/2005 Document Revised: 07/04/2011 Document Reviewed: 01/26/2011 [facility] Patient Information 2015 [place], [place]. This information is not intended to replace advice given to you by your health care provider. Make sure you discuss any questions you have with your health care provider.

## 2013-12-10 ENCOUNTER — Inpatient Hospital Stay (HOSPITAL_COMMUNITY)
Admission: AD | Admit: 2013-12-10 | Discharge: 2013-12-12 | DRG: 775 | Disposition: A | Discharge status: Home / Self Care | Payer: Medicaid Other | Source: Ambulatory Visit | Attending: Family Medicine | Admitting: Family Medicine

## 2013-12-10 ENCOUNTER — Encounter (HOSPITAL_COMMUNITY): Payer: Self-pay | Admitting: *Deleted

## 2013-12-10 ENCOUNTER — Inpatient Hospital Stay (HOSPITAL_COMMUNITY): Payer: Medicaid Other | Admitting: Anesthesiology

## 2013-12-10 ENCOUNTER — Encounter (HOSPITAL_COMMUNITY): Payer: Medicaid Other | Admitting: Anesthesiology

## 2013-12-10 DIAGNOSIS — D573 Sickle-cell trait: Secondary | ICD-10-CM | POA: Diagnosis present

## 2013-12-10 DIAGNOSIS — O9902 Anemia complicating childbirth: Secondary | ICD-10-CM | POA: Diagnosis present

## 2013-12-10 DIAGNOSIS — F1410 Cocaine abuse, uncomplicated: Secondary | ICD-10-CM | POA: Diagnosis present

## 2013-12-10 DIAGNOSIS — O99344 Other mental disorders complicating childbirth: Secondary | ICD-10-CM | POA: Diagnosis present

## 2013-12-10 DIAGNOSIS — F1110 Opioid abuse, uncomplicated: Secondary | ICD-10-CM | POA: Diagnosis present

## 2013-12-10 DIAGNOSIS — O479 False labor, unspecified: Secondary | ICD-10-CM | POA: Diagnosis present

## 2013-12-10 DIAGNOSIS — O99334 Smoking (tobacco) complicating childbirth: Secondary | ICD-10-CM | POA: Diagnosis present

## 2013-12-10 DIAGNOSIS — Z8249 Family history of ischemic heart disease and other diseases of the circulatory system: Secondary | ICD-10-CM

## 2013-12-10 DIAGNOSIS — Z833 Family history of diabetes mellitus: Secondary | ICD-10-CM | POA: Diagnosis not present

## 2013-12-10 HISTORY — DX: Other psychoactive substance abuse, uncomplicated: F19.10

## 2013-12-10 LAB — CBC
HCT: 32.4 % — ABNORMAL LOW (ref 36.0–46.0)
Hemoglobin: 11.4 g/dL — ABNORMAL LOW (ref 12.0–15.0)
MCH: 31.7 pg (ref 26.0–34.0)
MCHC: 35.2 g/dL (ref 30.0–36.0)
MCV: 90 fL (ref 78.0–100.0)
Platelets: 171 10*3/uL (ref 150–400)
RBC: 3.6 MIL/uL — ABNORMAL LOW (ref 3.87–5.11)
RDW: 13.9 % (ref 11.5–15.5)
WBC: 8.4 10*3/uL (ref 4.0–10.5)

## 2013-12-10 LAB — RAPID URINE DRUG SCREEN, HOSP PERFORMED
Amphetamines: NOT DETECTED
BARBITURATES: NOT DETECTED
BENZODIAZEPINES: NOT DETECTED
COCAINE: NOT DETECTED
Opiates: NOT DETECTED
Tetrahydrocannabinol: POSITIVE — AB

## 2013-12-10 LAB — RPR

## 2013-12-10 MED ORDER — LACTATED RINGERS IV BOLUS (SEPSIS)
1000.0000 mL | Freq: Once | INTRAVENOUS | Status: AC
  Administered 2013-12-10: 1000 mL via INTRAVENOUS

## 2013-12-10 MED ORDER — TRAZODONE HCL 100 MG PO TABS
100.0000 mg | ORAL_TABLET | Freq: Every evening | ORAL | Status: DC | PRN
  Filled 2013-12-10: qty 1

## 2013-12-10 MED ORDER — FENTANYL CITRATE 0.05 MG/ML IJ SOLN
INTRAMUSCULAR | Status: AC
  Filled 2013-12-10: qty 2

## 2013-12-10 MED ORDER — FENTANYL CITRATE 0.05 MG/ML IJ SOLN
100.0000 ug | Freq: Once | INTRAMUSCULAR | Status: AC
  Administered 2013-12-10: 100 ug via INTRAMUSCULAR
  Filled 2013-12-10: qty 2

## 2013-12-10 MED ORDER — SIMETHICONE 80 MG PO CHEW: 80.0000 mg | CHEWABLE_TABLET | ORAL | Status: DC | PRN

## 2013-12-10 MED ORDER — LANOLIN HYDROUS EX OINT: TOPICAL_OINTMENT | CUTANEOUS | Status: DC | PRN

## 2013-12-10 MED ORDER — LACTATED RINGERS IV SOLN: INTRAVENOUS | Status: DC

## 2013-12-10 MED ORDER — PHENYLEPHRINE 40 MCG/ML (10ML) SYRINGE FOR IV PUSH (FOR BLOOD PRESSURE SUPPORT)
80.0000 ug | PREFILLED_SYRINGE | INTRAVENOUS | Status: DC | PRN
  Filled 2013-12-10: qty 2

## 2013-12-10 MED ORDER — IBUPROFEN 600 MG PO TABS
600.0000 mg | ORAL_TABLET | Freq: Four times a day (QID) | ORAL | Status: DC
  Administered 2013-12-10 (×2): 600 mg via ORAL
  Filled 2013-12-10 (×2): qty 1

## 2013-12-10 MED ORDER — PRENATAL MULTIVITAMIN CH
1.0000 | ORAL_TABLET | Freq: Every day | ORAL | Status: DC
  Administered 2013-12-10: 1 via ORAL
  Filled 2013-12-10: qty 1

## 2013-12-10 MED ORDER — ONDANSETRON HCL 4 MG/2ML IJ SOLN: 4.0000 mg | Freq: Four times a day (QID) | INTRAMUSCULAR | Status: DC | PRN

## 2013-12-10 MED ORDER — OXYTOCIN 40 UNITS IN LACTATED RINGERS INFUSION - SIMPLE MED
62.5000 mL/h | INTRAVENOUS | Status: DC
  Filled 2013-12-10: qty 1000

## 2013-12-10 MED ORDER — BENZOCAINE-MENTHOL 20-0.5 % EX AERO: 1.0000 "application " | INHALATION_SPRAY | CUTANEOUS | Status: DC | PRN

## 2013-12-10 MED ORDER — CITRIC ACID-SODIUM CITRATE 334-500 MG/5ML PO SOLN: 30.0000 mL | ORAL | Status: DC | PRN

## 2013-12-10 MED ORDER — ONDANSETRON HCL 4 MG/2ML IJ SOLN: 4.0000 mg | INTRAMUSCULAR | Status: DC | PRN

## 2013-12-10 MED ORDER — EPHEDRINE 5 MG/ML INJ
10.0000 mg | INTRAVENOUS | Status: DC | PRN
  Filled 2013-12-10: qty 2

## 2013-12-10 MED ORDER — SENNOSIDES-DOCUSATE SODIUM 8.6-50 MG PO TABS
2.0000 | ORAL_TABLET | ORAL | Status: DC
  Administered 2013-12-10: 2 via ORAL
  Filled 2013-12-10 (×2): qty 2

## 2013-12-10 MED ORDER — TETANUS-DIPHTH-ACELL PERTUSSIS 5-2.5-18.5 LF-MCG/0.5 IM SUSP: 0.5000 mL | Freq: Once | INTRAMUSCULAR | Status: DC

## 2013-12-10 MED ORDER — FENTANYL CITRATE 0.05 MG/ML IJ SOLN
100.0000 ug | Freq: Once | INTRAMUSCULAR | Status: AC
  Administered 2013-12-10: 100 ug via INTRAVENOUS
  Filled 2013-12-10: qty 2

## 2013-12-10 MED ORDER — IBUPROFEN 600 MG PO TABS
600.0000 mg | ORAL_TABLET | Freq: Four times a day (QID) | ORAL | Status: DC | PRN
Start: 2013-12-10 — End: 2013-12-10

## 2013-12-10 MED ORDER — FENTANYL 2.5 MCG/ML BUPIVACAINE 1/10 % EPIDURAL INFUSION (WH - ANES)
14.0000 mL/h | INTRAMUSCULAR | Status: DC | PRN
  Administered 2013-12-10 (×2): 14 mL/h via EPIDURAL
  Filled 2013-12-10: qty 125

## 2013-12-10 MED ORDER — DIBUCAINE 1 % RE OINT: 1.0000 "application " | TOPICAL_OINTMENT | RECTAL | Status: DC | PRN

## 2013-12-10 MED ORDER — FENTANYL 2.5 MCG/ML BUPIVACAINE 1/10 % EPIDURAL INFUSION (WH - ANES)
INTRAMUSCULAR | Status: AC
  Filled 2013-12-10: qty 125

## 2013-12-10 MED ORDER — LIDOCAINE HCL (PF) 1 % IJ SOLN
30.0000 mL | INTRAMUSCULAR | Status: DC | PRN
  Filled 2013-12-10: qty 30

## 2013-12-10 MED ORDER — BUPRENORPHINE HCL 2 MG SL SUBL
4.0000 mg | SUBLINGUAL_TABLET | Freq: Two times a day (BID) | SUBLINGUAL | Status: DC
  Administered 2013-12-10 – 2013-12-12 (×4): 4 mg via SUBLINGUAL
  Filled 2013-12-10 (×6): qty 2

## 2013-12-10 MED ORDER — DIPHENHYDRAMINE HCL 50 MG/ML IJ SOLN
50.0000 mg | Freq: Once | INTRAMUSCULAR | Status: AC
  Administered 2013-12-10: 50 mg via INTRAVENOUS

## 2013-12-10 MED ORDER — DIPHENHYDRAMINE HCL 25 MG PO CAPS
25.0000 mg | ORAL_CAPSULE | Freq: Four times a day (QID) | ORAL | Status: DC | PRN
Start: 2013-12-10 — End: 2013-12-12
  Administered 2013-12-10 – 2013-12-12 (×4): 25 mg via ORAL
  Filled 2013-12-10 (×5): qty 1

## 2013-12-10 MED ORDER — WITCH HAZEL-GLYCERIN EX PADS: 1.0000 "application " | MEDICATED_PAD | CUTANEOUS | Status: DC | PRN

## 2013-12-10 MED ORDER — LACTATED RINGERS IV SOLN: 500.0000 mL | Freq: Once | INTRAVENOUS | Status: DC

## 2013-12-10 MED ORDER — ZOLPIDEM TARTRATE 5 MG PO TABS: 5.0000 mg | ORAL_TABLET | Freq: Every evening | ORAL | Status: DC | PRN

## 2013-12-10 MED ORDER — FENTANYL 2.5 MCG/ML BUPIVACAINE 1/10 % EPIDURAL INFUSION (WH - ANES): 14.0000 mL/h | INTRAMUSCULAR | Status: DC | PRN

## 2013-12-10 MED ORDER — FENTANYL CITRATE 0.05 MG/ML IJ SOLN
100.0000 ug | INTRAMUSCULAR | Status: DC | PRN
  Administered 2013-12-10 (×3): 100 ug via INTRAVENOUS
  Filled 2013-12-10 (×3): qty 2

## 2013-12-10 MED ORDER — LACTATED RINGERS IV SOLN: 500.0000 mL | INTRAVENOUS | Status: DC | PRN

## 2013-12-10 MED ORDER — DIPHENHYDRAMINE HCL 50 MG/ML IJ SOLN
12.5000 mg | INTRAMUSCULAR | Status: DC | PRN
  Filled 2013-12-10: qty 1

## 2013-12-10 MED ORDER — ONDANSETRON HCL 4 MG PO TABS: 4.0000 mg | ORAL_TABLET | ORAL | Status: DC | PRN

## 2013-12-10 MED ORDER — OXYTOCIN BOLUS FROM INFUSION: 500.0000 mL | INTRAVENOUS | Status: DC

## 2013-12-10 MED ORDER — LIDOCAINE HCL (PF) 1 % IJ SOLN
INTRAMUSCULAR | Status: DC | PRN
  Administered 2013-12-10: 10 mL

## 2013-12-10 MED ORDER — PHENYLEPHRINE 40 MCG/ML (10ML) SYRINGE FOR IV PUSH (FOR BLOOD PRESSURE SUPPORT)
80.0000 ug | PREFILLED_SYRINGE | INTRAVENOUS | Status: DC | PRN
  Filled 2013-12-10: qty 2
  Filled 2013-12-10: qty 10

## 2013-12-10 NOTE — Anesthesia Procedure Notes (Signed)
Epidural Patient location during procedure: OB  Preanesthetic Checklist Completed: patient identified, site marked, surgical consent, pre-op evaluation, timeout performed, IV checked, risks and benefits discussed and monitors and equipment checked  Epidural Patient position: sitting Prep: site prepped and draped and DuraPrep Patient monitoring: continuous pulse ox and blood pressure Approach: midline Injection technique: LOR air  Needle:  Needle type: Tuohy  Needle gauge: 17 G Needle length: 9 cm and 9 Needle insertion depth: 5 cm cm Catheter type: closed end flexible Catheter size: 19 Gauge Catheter at skin depth: 10 cm Test dose: negative  Assessment Events: blood not aspirated, injection not painful, no injection resistance, negative IV test and no paresthesia  Additional Notes Dosing of Epidural:  1st dose, through catheter .............................................  Xylocaine 40 mg  2nd dose, through catheter, after waiting 3 minutes.........Xylocaine 60 mg    ( 1% Xylo charted as a single dose in Epic Meds for ease of charting; actual dosing was fractionated as above, for saftey's sake)  As each dose occurred, patient was free of IV sx; and patient exhibited no evidence of SA injection.  Patient is more comfortable after epidural dosed. Please see RN's note for documentation of vital signs,and FHR which are stable.  Patient reminded not to try to ambulate with numb legs, and that an RN must be present when she attempts to get up.       

## 2013-12-10 NOTE — Lactation Note (Signed)
This note was copied from the chart of Kathy Rosette RevealSarah Mccain. Lactation Consultation Note Initial visit at 9 hours of age.  Chart indicated mom is on suboxone for drug abuse history.  Social worker has visited, but unable to complete report at that visit.  Mom has visitors and 28 year old daughter in room, but indicated its okay for a visit and breast attempt.  Mom breastfed for 4 months with formula supplementation.  Mom reports no problems, but she has PTSD and it did affect breastfeeding.  Encouraged mom that breastfeeding can help her resolve those issues and mom agrees.  Attempted hand expression and mom is complaining of pain and appears uncomfortable.  Encouraged mom to attempt on her own.  No colostrum noted at this time.  Discussed pacifier use with mom and cautioned use with breast feeding.  Mom removed pacifier and baby is frantic to suck with increased tone.  [facility] and suggested baby may be a candidate for pacifier use considering moms drug history.  Mom attempted latch in football hold on left breast.  Baby has vigorous strong suck and mom reports pain.  Showed mom how to break suction and remove baby.  Encouraged mom to hold baby for STS and she didn't "have" to breastfeed baby at this time.  Mom is eager to learn cross cradle hold and attempts on right breast.  Mom decides to bottle feed at this time and wants to continue to try on her own later.  Supported mom in this choice.  [facility], but she received a DEBP for a shower gift.  Encouraged mom to consider pumping to protect milk supply.  [facility] resources given and discussed.  Encouraged to feed with early cues on demand.  Early newborn behavior discussed. Encouraged mom to call for assist as needed.     Patient Name: [name]: 12/10/2013 Reason for consult: Initial assessment   Maternal Data Has patient been taught Hand Expression?: Yes Does the patient have breastfeeding  experience prior to this delivery?: Yes  Feeding Feeding Type: Breast Fed Length of feed:  (few sucks)  LATCH Score/Interventions Latch: Repeated attempts needed to sustain latch, nipple held in mouth throughout feeding, stimulation needed to elicit sucking reflex. Intervention(s): Adjust position;Assist with latch  Audible Swallowing: None  Type of Nipple: Everted at rest and after stimulation  Comfort (Breast/Nipple): Filling, red/small blisters or bruises, mild/mod discomfort     Hold (Positioning): Assistance needed to correctly position infant at breast and maintain latch. Intervention(s): Breastfeeding basics reviewed;Support Pillows;Position options;Skin to skin  LATCH Score: 5  Lactation Tools Discussed/Used     Consult Status Consult Status: Follow-up [name]: 12/11/13 Follow-up type: In-patient    Shoptaw, Arvella MerlesJana Lynn 12/10/2013, 7:16 PM

## 2013-12-10 NOTE — Anesthesia Preprocedure Evaluation (Signed)
Anesthesia Evaluation  Patient identified by MRN, date of birth, ID band Patient awake    Reviewed: Allergy & Precautions, H&P , Patient's Chart, lab work & pertinent test results  Airway Mallampati: II TM Distance: >3 FB Neck ROM: full    Dental  (+) Teeth Intact   Pulmonary Current Smoker,  breath sounds clear to auscultation        Cardiovascular Rhythm:regular Rate:Normal     Neuro/Psych    GI/Hepatic   Endo/Other    Renal/GU      Musculoskeletal   Abdominal   Peds  Hematology   Anesthesia Other Findings       Reproductive/Obstetrics (+) Pregnancy                           Anesthesia Physical Anesthesia Plan  ASA: II  Anesthesia Plan: Epidural   Post-op Pain Management:    Induction:   Airway Management Planned:   Additional Equipment:   Intra-op Plan:   Post-operative Plan:   Informed Consent: I have reviewed the patients History and Physical, chart, labs and discussed the procedure including the risks, benefits and alternatives for the proposed anesthesia with the patient or authorized representative who has indicated his/her understanding and acceptance.   Dental Advisory Given  Plan Discussed with:   Anesthesia Plan Comments: (Labs checked- platelets confirmed with RN in room. Fetal heart tracing, per RN, reported to be stable enough for sitting procedure. Discussed epidural, and patient consents to the procedure:  included risk of possible headache,backache, failed block, allergic reaction, and nerve injury. This patient was asked if she had any questions or concerns before the procedure started.)        Anesthesia Quick Evaluation  

## 2013-12-10 NOTE — Anesthesia Postprocedure Evaluation (Signed)
  Anesthesia Post-op Note  Anesthesia Post Note  Patient: [name]  Procedure(s) Performed: * No procedures listed *  Anesthesia type: Epidural  Patient location: Mother/Baby  Post pain: Pain level controlled  Post assessment: Post-op Vital signs reviewed  Last Vitals:  Filed Vitals:   12/10/13 1101  BP: 134/85  Pulse: 53  Temp:   Resp:     Post vital signs: Reviewed  Level of consciousness:alert  Complications: No apparent anesthesia complications

## 2013-12-10 NOTE — MAU Note (Signed)
Dr. Gayla DossJoyner at the bedside to check cervix and discuss plan of care.

## 2013-12-10 NOTE — MAU Note (Signed)
Contractions every 5 minutes for a couple. Denies vaginal bleeding and leaking of fluid.

## 2013-12-10 NOTE — Progress Notes (Signed)
12/10/13 1600  Clinical Encounter Type  Visited With Patient and family together (mom [name], dtr [name] (10))  Visit Type Spiritual support;Social support  Referral From Nurse;Physician Synetta Fail(Anita Sowder, RN; Dr Elder NegusKaye Gable)  Spiritual Encounters  Spiritual Needs Emotional  Stress Factors  Patient Stress Factors Major life changes   Visited with [name], mom [name] (emphasis on middle syllable), and dtr [name], providing pastoral presence, reflective listening, and spiritual hospitality as [name] processed how she understands baby [name]'s birth as a turning point in her life.  [name] did strong reflection work on guilt vs shame, living with a past/future vs present orientation, and personal priorities (freeing herself from self-judgment, focusing on life in the present, using support resources).  Pt also pointed out moments when her mom articulated her own thoughts and feelings well; they appear to have a supportive and well-connected relationship, which both affirmed.  Will refer to chaplain for follow-up tomorrow to provide additional space for reflection further out from delivery.  [address]Chaplain Fidencia Mccloud [place], [place] [phone]
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
## 2013-12-10 NOTE — H&P (Signed)
[name] is a 28 y.o. female [phone] with IUP at [redacted]w[redacted]d presenting for labor. Pt states she has been having regular, every 2-3 minutes contractions, associated with less flow than a normal period vaginal bleeding.  Membranes are intact, ruptured, with active fetal movement.    [facility].  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 Sickle cell trait. Hx of chlamydia w/o TOC  Past Medical History: Past Medical History  Diagnosis Date  . STD (female)     hx of chlamydia and gonorrhea  . History of PID   . Endometriosis   . Glaucoma   . Ovarian cyst   . Bartholin cyst   . Headache(784.0)   . Infection     UTI  . Vaginal Pap smear, abnormal     has not followed up  . Substance abuse     Past Surgical History: Past Surgical History  Procedure Laterality Date  . Fracture surgery      left leg  . Dilation and curettage of uterus    . Laparoscopy      Obstetrical History: OB History   Grav Para Term Preterm Abortions TAB SAB Ect Mult Living   4 1 0 1 2 0 2 0 0 1       Social History: History   Social History  . Marital Status: Single    Spouse Name: N/A    Number of Children: N/A  . Years of Education: N/A   Social History Main Topics  . Smoking status: Current Every Day Smoker -- 0.25 packs/day for 7 years    Types: Cigarettes  . Smokeless tobacco: Never Used  . Alcohol Use: No     Comment: Denies ETOH use  . Drug Use: Yes    Special: Marijuana, Cocaine, Heroin     Comment: .denies use currently taking suboxone-12/10/2013  . Sexual Activity: Yes    Birth Control/ Protection: Condom, None     Comment: Pt currently [redacted] weeks pregnant   Other Topics Concern  . None   Social History Narrative  . None    Family History: Family History  Problem Relation Age of Onset  . Anesthesia problems Neg Hx   . Hypotension Neg Hx   . Malignant hyperthermia Neg Hx   . Pseudochol deficiency Neg Hx   .  Alcohol abuse Neg Hx   . Diabetes Mother   . Hypertension Maternal Grandmother   . Heart disease Maternal Grandmother     great grandma    Allergies: Allergies  Allergen Reactions  . Acyclovir And Related Swelling  . Darvocet [Propoxyphene N-Acetaminophen] Hives  . Doxycycline Swelling  . Flexeril [Cyclobenzaprine] Nausea And Vomiting  . Metoclopramide Hives  . Naproxen Hives    Pt is able to take ibuprofen without problems  . Latex Rash  . Tramadol Rash    Prescriptions prior to admission  Medication Sig Dispense Refill  . buprenorphine (SUBUTEX) 2 MG SUBL SL tablet Place 2 tablets (4 mg total) under the tongue 2 (two) times daily at 10 AM and 5 PM. (See hand-written prescription by Dr. Dub Mikes): For opioid detox  28 tablet  0  . ondansetron (ZOFRAN) 4 MG tablet Take 1 tablet (4 mg total) by mouth every 8 (eight) hours as needed for nausea or vomiting.  20 tablet  0  . Prenatal Vit-Fe Fumarate-FA (PRENATAL MULTIVITAMIN) TABS tablet Take 1 tablet by mouth daily at 12 noon. :Prenatal Vitamin  30 tablet  0  .  traZODone (DESYREL) 100 MG tablet Take 1 tablet (100 mg total) by mouth at bedtime as needed for sleep.  30 tablet  0    Review of Systems: Negative unless otherwise stated in History above  Physicial Blood pressure 125/85, pulse 64, temperature 98.4 F (36.9 C), temperature source Oral, resp. rate 20, last menstrual period 02/28/2013. General appearance: alert, cooperative and appears stated age Lungs: clear to auscultation bilaterally Heart: regular rate and rhythm Abdomen: soft, non-tender; bowel sounds normal Extremities: Homans sign is negative, no sign of DVT Presentation: cephalic Fetal monitoringBaseline: 130 bpm, Variability: Good {> 6 bpm), Accelerations: Reactive and Decelerations: Absent Uterine activityFrequency: Every 3 minutes Dilation: 3 Effacement (%): 60 Station: -3 Exam by:: Gayla DossJoyner MD  Prenatal labs: ABO, Rh: --/--/B POS (04/14 2010) Antibody: NEG  (02/05 1429) Rubella:   RPR: NON REAC (04/16 1912)  HBsAg: NEGATIVE (02/05 1429)  HIV: NONREACTIVE (04/16 1912)  GBS: Negative (08/15 0000)  GTT: Declined  Prenatal Transfer Tool  Maternal Diabetes: No Genetic Screening: Declined Maternal Ultrasounds/Referrals: Normal Fetal Ultrasounds or other Referrals:  None Maternal Substance Abuse:  Yes:  Type: Marijuana, Cocaine, Other:  Significant Maternal Medications:  None Significant Maternal Lab Results: Lab values include: Group B Strep negative  Results for orders placed during the hospital encounter of 12/10/13 (from the past 24 hour(s))  URINE RAPID DRUG SCREEN (HOSP PERFORMED)   Collection Time    12/10/13  4:38 AM      Result Value Ref Range   Opiates NONE DETECTED  NONE DETECTED   Cocaine NONE DETECTED  NONE DETECTED   Benzodiazepines NONE DETECTED  NONE DETECTED   Amphetamines NONE DETECTED  NONE DETECTED   Tetrahydrocannabinol POSITIVE (*) NONE DETECTED   Barbiturates NONE DETECTED  NONE DETECTED    Assessment: [name] is a 28 y.o. [phone] at 2341w6d by here for active labor. THC positive on admission. Hx of Cocaine and Heroin use this pregnancy.   #Labor:admit to birthing suite; Meconium noted on SROM #Pain: Epidural on request #FWB: Cat 1  #ID:  GBS negative #Feeding: ?  #MOC:? #Circ:  ?  Wenda LowJames Joyner MD [facility] PGY-1 12/10/2013, 6:53 AM   I have seen this patient and agree with the above resident's note.  LEFTWICH-KIRBY, Pinki Rottman Certified Nurse-Midwife

## 2013-12-11 LAB — GC/CHLAMYDIA PROBE AMP
CT Probe RNA: NEGATIVE
GC Probe RNA: NEGATIVE

## 2013-12-11 MED ORDER — SODIUM CHLORIDE 0.9 % IJ SOLN
3.0000 mL | INTRAMUSCULAR | Status: DC | PRN
  Administered 2013-12-11: 3 mL via INTRAVENOUS

## 2013-12-11 MED ORDER — KETOROLAC TROMETHAMINE 30 MG/ML IJ SOLN
30.0000 mg | Freq: Four times a day (QID) | INTRAMUSCULAR | Status: DC | PRN
  Administered 2013-12-11 – 2013-12-12 (×5): 30 mg via INTRAVENOUS
  Filled 2013-12-11 (×6): qty 1

## 2013-12-11 NOTE — Progress Notes (Signed)
I attempted follow up this morning, but patient was not in the room.  We will continue to try to follow up as we are able.  [phone]Chaplain Katy [place] Pager, [phone] 4:14 PM   12/11/13 1600  Clinical Encounter Type  Visited With Health care provider (RN and CSW)
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
## 2013-12-11 NOTE — Progress Notes (Signed)
Post Partum Day 1 Subjective: up ad lib, voiding, tolerating PO, + flatus and Endorses pelvic and low back pain  Objective: Blood pressure 104/69, pulse 48, temperature 98 F (36.7 C), temperature source Oral, resp. rate 17, height 5\' 4"  (1.626 m), weight 77.111 kg (170 lb), last menstrual period 02/28/2013, SpO2 100.00%, unknown if currently breastfeeding.  Physical Exam:  General: alert, cooperative and appears stated age Lochia: appropriate Uterine Fundus: firm Incision: NA DVT Evaluation: No evidence of DVT seen on physical exam. Negative Homan's sign. No cords or calf tenderness. No significant calf/ankle edema.   Recent Labs  12/10/13 0503  HGB 11.4*  HCT 32.4*    Assessment/Plan: Plan for discharge tomorrow, Lactation consult, Social Work consult and Contraception Undecided. Discussed LARC discussed   LOS: 1 day   Wenda LowJoyner, James 12/11/2013, 6:44 AM   Evaluation and management procedures were performed by Resident physician under my supervision/collaboration. Chart reviewed, patient examined by me and I agree with management and plan. Danae Orleanseirdre C Dawnita Molner, CNM 12/11/2013 10:03 AM

## 2013-12-11 NOTE — Progress Notes (Signed)
UR chart review completed.  

## 2013-12-11 NOTE — Plan of Care (Signed)
Problem: Discharge Progression Outcomes Goal: Barriers To Progression Addressed/Resolved Outcome: Progressing Poor pain control

## 2013-12-11 NOTE — Progress Notes (Signed)
Pt called out wants pain medication. Walked in room and pt no longer in room, daughter in room with grandmother states that pt got dressed and walked out of room. Will give paint medication when pt arrives to her room.

## 2013-12-12 NOTE — Progress Notes (Addendum)
Clinical Social Work  CSW was informed that baby was admitted to the NICU. CSW went to room to offer emotional support to [name] but [name] out of room at this time. CSW will follow up at later time.  Unk LightningHolly Shayra Anton, LCSW (Coverage for Loleta BooksSarah Venning)  Addendum 920 CSW passed [name] in hallway. [name] reports she wanted to inform CSW that baby was placed in the NICU last night. CSW allowed [name] time to process her feelings about baby's move to NICU and validated feelings. [name] reports that she knows that baby will receive good care in NICU and has to keep telling herself that she is doing what is best for baby. CSW encouraged [name] to continue to visit baby and updated her that NICU CSW would follow up with her as well. [name] reports that she plans on DC today back with FOB. [name] reports that she needs to follow up with probation officer and go to clinic in [place]. [name] reports that her aunt, step mother, and godmother are all aware of plans and agreeable to support [name]. [name] reports if she becomes too overwhelmed she has made plans to stay with step mother to ensure she does not relapse. CSW and [name] discussed if [name] was having any urges to use again. [name] reports when she gets out of routine and feels stressed that she has urges to use substances but reports she is motivated to stay sober. [name] reports that she plans on following up with NA meetings on a consistent basis and feels she needs to attend a meeting today. [name] reports she plans on focusing on her MH as well and scheduling an appointment so that she can get back on medications and have support counseling. [name] thanked CSW for visit and reports she plans to follow up with NICU CSW for further needs.

## 2013-12-12 NOTE — Plan of Care (Signed)
Problem: Discharge Progression Outcomes Goal: Barriers To Progression Addressed/Resolved Outcome: Progressing Pain control NICU baby

## 2013-12-12 NOTE — Discharge Instructions (Signed)

## 2013-12-12 NOTE — Discharge Summary (Signed)
Obstetric Discharge Summary Reason for Admission: onset of labor Prenatal Procedures: ultrasound Intrapartum Procedures: spontaneous vaginal delivery Postpartum Procedures: none Complications-Operative and Postpartum: none Hemoglobin  Date Value Ref Range Status  12/10/2013 11.4* 12.0 - 15.0 g/dL Final     HCT  Date Value Ref Range Status  12/10/2013 32.4* 36.0 - 46.0 % Final    Discharge Diagnoses: Term Pregnancy-delivered  Hospital Course:  [name] is a 28 y.o. [id number] who presented with labor. 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 She had a SVD with shoulder dystocia x 40 seconds. She was able to ambulate, tolerate PO and void normally. She was discharged home with instructions for postpartum care.    Delivery Note At 9:44 AM a viable female was delivered via Vaginal, Spontaneous Delivery (Presentation: Left Occiput Anterior). 40 second shoulder dystocia resolved with McRoberts, suprapubic pressure, and internal maneuvers. NICU at delivery for thick meconium. APGAR: 8, 9; weight 3260 gm.  Placenta status: Intact, Spontaneous. Cord: 3 vessels with the following complications: None.   Anesthesia: Epidural  Episiotomy: None  Lacerations: None  Est. Blood Loss (mL): 250 cc  Mom to postpartum. Baby to Couplet care / Skin to Skin.   William DaltonMcEachern, Morgan  12/10/2013, 10:54 AM    Physical Exam:  General: alert, cooperative and no distress Lochia: appropriate Uterine Fundus: firm DVT Evaluation: No evidence of DVT seen on physical exam. Negative Homan's sign. No cords or calf tenderness.  Discharge Information: Date: 12/12/2013 Activity: unrestricted Diet: routine Medications: PNV and Ibuprofen Baby feeding: plans to bottle feed Contraception: Undecided Condition: stable Instructions: refer to practice specific booklet Discharge to: home   Newborn Data: Live born female  Birth Weight: 7 lb 3 oz (3260  g) APGAR: 8, 9  Home with mother.  Rodrigo Ranrystal Dorsey, MD [facility] PGY-1 12/12/2013, 8:36 AM  I have seen and examined this patient and I agree with the above. Cam HaiSHAW, KIMBERLY CNM 8:52 AM 12/12/2013

## 2013-12-13 ENCOUNTER — Ambulatory Visit: Payer: Self-pay

## 2013-12-13 NOTE — Lactation Note (Signed)
This note was copied from the chart of [name]. Lactation Consultation Note  Follow up with mom in NICU.  Mom states her breasts are engorged and she would like to pump.  She states she has a pump at home that someone gave her but she was unable to get it to work.  Mom states she does not have WIC yet.  I brought mom to pumping room to assist pumping with DEBP.  Both breasts are engorged and very firm.  Mom pumped for 30 minutes and obtained a total of 45 mls transitional milk.   Some softening noted.  Ice packs given to mom to apply to breasts.  Instructed to ice every 1 1/2 hours for 30 minutes and pump every 1 1/2- 3 hours.  Mom states she will return this evening to use pump and try to use her pump at home.  She states she did not start pumping when she was here as a patient.  She was discharged yesterday.    Patient Name: Kathy Rosette RevealSarah Petkus WGNFA'OToday's Date: 12/13/2013     Maternal Data    Feeding Feeding Type: Formula Length of feed: 30 min  LATCH Score/Interventions                      Lactation Tools Discussed/Used     Consult Status      Huston FoleyMOULDEN, Dazja Houchin S 12/13/2013, 3:22 PM

## 2013-12-13 NOTE — MAU Provider Note (Signed)
Attestation of Attending Supervision of Advanced Practitioner: Evaluation and management procedures were performed by the PA/NP/CNM/OB Fellow under my supervision/collaboration. Chart reviewed and agree with management and plan.  Govanni Plemons V 12/13/2013 7:38 AM

## 2013-12-13 NOTE — Discharge Summary (Signed)
`````  Attestation of Attending Supervision of Advanced Practitioner: Evaluation and management procedures were performed by the PA/NP/CNM/OB Fellow under my supervision/collaboration. Chart reviewed and agree with management and plan.  Dejah Droessler V 12/13/2013 7:41 AM

## 2013-12-31 ENCOUNTER — Inpatient Hospital Stay (HOSPITAL_COMMUNITY)
Admission: AD | Admit: 2013-12-31 | Discharge: 2013-12-31 | Discharge status: Against Medical Advice | Payer: Medicaid Other | Source: Ambulatory Visit | Attending: Family Medicine | Admitting: Family Medicine

## 2013-12-31 ENCOUNTER — Encounter (HOSPITAL_COMMUNITY): Payer: Self-pay | Admitting: *Deleted

## 2013-12-31 DIAGNOSIS — R109 Unspecified abdominal pain: Secondary | ICD-10-CM | POA: Insufficient documentation

## 2013-12-31 DIAGNOSIS — R5383 Other fatigue: Secondary | ICD-10-CM | POA: Diagnosis not present

## 2013-12-31 DIAGNOSIS — IMO0002 Reserved for concepts with insufficient information to code with codable children: Secondary | ICD-10-CM | POA: Insufficient documentation

## 2013-12-31 DIAGNOSIS — R5381 Other malaise: Secondary | ICD-10-CM | POA: Diagnosis not present

## 2013-12-31 NOTE — MAU Note (Signed)
Urine in lab 

## 2013-12-31 NOTE — Progress Notes (Signed)
States she has not had her subutex in 3-4 days and thinks she may be having withdrawal sxs. States she has been through withdrawal before. States she was getting her subutex in [place], but "I don't stay there anymore. Patient does not have a current plan. Patient is very tearful, restless, C/O generalized pain and "I just feel awful."

## 2013-12-31 NOTE — MAU Note (Signed)
hAd baby 3 wks ago, been running back and forth 2 times a day to see the baby.  Is worn out, no energy.  Hasn't eaten.  Having diarrhea past few days.  Cramping had stopped, has restarted. (explained to pt the cramping may be from the diarrhea, should not go to nursery with diarrhea)

## 2013-12-31 NOTE — Progress Notes (Signed)
Patient states her doctor in [facility] called and has arranged for her to get her medication. Patient states she is going to check on her baby, then go to [facility] to get her medication. Patient is no longer tearful, does not appear to be suffering from pain. Was in the fetal position and now is sitting up ready to go. Encouraged patient to wait and see MD, but patient states she thinks this is what she needs, that she thinks she is having withdrawal and needs her med. Patient signed AMA form and left in satisfactory condition.

## 2014-01-13 ENCOUNTER — Encounter (HOSPITAL_COMMUNITY): Payer: Self-pay | Admitting: Emergency Medicine

## 2014-01-13 ENCOUNTER — Emergency Department (HOSPITAL_COMMUNITY)
Admission: EM | Admit: 2014-01-13 | Discharge: 2014-01-13 | Discharge status: Against Medical Advice | Payer: Medicaid Other | Attending: Emergency Medicine | Admitting: Emergency Medicine

## 2014-01-13 DIAGNOSIS — R112 Nausea with vomiting, unspecified: Secondary | ICD-10-CM | POA: Diagnosis not present

## 2014-01-13 DIAGNOSIS — F17200 Nicotine dependence, unspecified, uncomplicated: Secondary | ICD-10-CM | POA: Diagnosis not present

## 2014-01-13 DIAGNOSIS — R109 Unspecified abdominal pain: Secondary | ICD-10-CM | POA: Insufficient documentation

## 2014-01-13 DIAGNOSIS — R197 Diarrhea, unspecified: Secondary | ICD-10-CM | POA: Diagnosis not present

## 2014-01-13 LAB — CBC WITH DIFFERENTIAL/PLATELET
BASOS PCT: 0 % (ref 0–1)
Basophils Absolute: 0 10*3/uL (ref 0.0–0.1)
Eosinophils Absolute: 0.1 10*3/uL (ref 0.0–0.7)
Eosinophils Relative: 1 % (ref 0–5)
HCT: 47.3 % — ABNORMAL HIGH (ref 36.0–46.0)
Hemoglobin: 16.5 g/dL — ABNORMAL HIGH (ref 12.0–15.0)
LYMPHS ABS: 1.9 10*3/uL (ref 0.7–4.0)
Lymphocytes Relative: 29 % (ref 12–46)
MCH: 29.7 pg (ref 26.0–34.0)
MCHC: 34.9 g/dL (ref 30.0–36.0)
MCV: 85.1 fL (ref 78.0–100.0)
Monocytes Absolute: 0.5 10*3/uL (ref 0.1–1.0)
Monocytes Relative: 8 % (ref 3–12)
NEUTROS PCT: 62 % (ref 43–77)
Neutro Abs: 3.9 10*3/uL (ref 1.7–7.7)
PLATELETS: 412 10*3/uL — AB (ref 150–400)
RBC: 5.56 MIL/uL — AB (ref 3.87–5.11)
RDW: 13 % (ref 11.5–15.5)
WBC: 6.4 10*3/uL (ref 4.0–10.5)

## 2014-01-13 LAB — URINALYSIS, ROUTINE W REFLEX MICROSCOPIC
Bilirubin Urine: NEGATIVE
Glucose, UA: NEGATIVE mg/dL
KETONES UR: NEGATIVE mg/dL
NITRITE: NEGATIVE
Protein, ur: NEGATIVE mg/dL
Specific Gravity, Urine: 1.007 (ref 1.005–1.030)
Urobilinogen, UA: 1 mg/dL (ref 0.0–1.0)
pH: 7.5 (ref 5.0–8.0)

## 2014-01-13 LAB — COMPREHENSIVE METABOLIC PANEL
ALT: 15 U/L (ref 0–35)
ANION GAP: 12 (ref 5–15)
AST: 12 U/L (ref 0–37)
Albumin: 4.3 g/dL (ref 3.5–5.2)
Alkaline Phosphatase: 64 U/L (ref 39–117)
BUN: 7 mg/dL (ref 6–23)
CALCIUM: 9.9 mg/dL (ref 8.4–10.5)
CO2: 24 meq/L (ref 19–32)
Chloride: 100 mEq/L (ref 96–112)
Creatinine, Ser: 0.96 mg/dL (ref 0.50–1.10)
GFR calc non Af Amer: 80 mL/min — ABNORMAL LOW (ref >90)
GLUCOSE: 98 mg/dL (ref 70–99)
Potassium: 4.8 mEq/L (ref 3.7–5.3)
Sodium: 136 mEq/L — ABNORMAL LOW (ref 137–147)
Total Bilirubin: 0.5 mg/dL (ref 0.3–1.2)
Total Protein: 8.1 g/dL (ref 6.0–8.3)

## 2014-01-13 LAB — LIPASE, BLOOD: LIPASE: 34 U/L (ref 11–59)

## 2014-01-13 LAB — URINE MICROSCOPIC-ADD ON

## 2014-01-13 NOTE — ED Notes (Signed)
Pt c/o abd pain x 3 days and n/v/d. Pt states she is having withdraws from Ceboxin.

## 2014-01-22 ENCOUNTER — Ambulatory Visit: Payer: Self-pay | Admitting: Obstetrics & Gynecology

## 2014-01-22 ENCOUNTER — Telehealth: Payer: Self-pay

## 2014-01-22 ENCOUNTER — Encounter: Payer: Self-pay | Admitting: Obstetrics & Gynecology

## 2014-01-22 NOTE — Telephone Encounter (Signed)
Patient missed today's appointment for PP visit. Called patient and husband answered, stated he would let patient know and have her call to reschedule.

## 2014-02-12 ENCOUNTER — Emergency Department (HOSPITAL_COMMUNITY)
Admission: EM | Admit: 2014-02-12 | Discharge: 2014-02-12 | Discharge status: Against Medical Advice | Payer: Medicaid Other | Attending: Emergency Medicine | Admitting: Emergency Medicine

## 2014-02-12 ENCOUNTER — Encounter (HOSPITAL_COMMUNITY): Payer: Self-pay | Admitting: Emergency Medicine

## 2014-02-12 DIAGNOSIS — Z720 Tobacco use: Secondary | ICD-10-CM | POA: Diagnosis not present

## 2014-02-12 DIAGNOSIS — Z8619 Personal history of other infectious and parasitic diseases: Secondary | ICD-10-CM | POA: Diagnosis not present

## 2014-02-12 DIAGNOSIS — Z79899 Other long term (current) drug therapy: Secondary | ICD-10-CM | POA: Insufficient documentation

## 2014-02-12 DIAGNOSIS — R52 Pain, unspecified: Secondary | ICD-10-CM

## 2014-02-12 DIAGNOSIS — S3993XA Unspecified injury of pelvis, initial encounter: Secondary | ICD-10-CM | POA: Diagnosis not present

## 2014-02-12 DIAGNOSIS — Z91040 Latex allergy status: Secondary | ICD-10-CM | POA: Insufficient documentation

## 2014-02-12 DIAGNOSIS — Z8669 Personal history of other diseases of the nervous system and sense organs: Secondary | ICD-10-CM | POA: Diagnosis not present

## 2014-02-12 DIAGNOSIS — S39003A Unspecified injury of muscle, fascia and tendon of pelvis, initial encounter: Secondary | ICD-10-CM | POA: Insufficient documentation

## 2014-02-12 DIAGNOSIS — R112 Nausea with vomiting, unspecified: Secondary | ICD-10-CM | POA: Diagnosis not present

## 2014-02-12 DIAGNOSIS — Z3202 Encounter for pregnancy test, result negative: Secondary | ICD-10-CM | POA: Diagnosis not present

## 2014-02-12 DIAGNOSIS — Z87440 Personal history of urinary (tract) infections: Secondary | ICD-10-CM | POA: Diagnosis not present

## 2014-02-12 DIAGNOSIS — N898 Other specified noninflammatory disorders of vagina: Secondary | ICD-10-CM | POA: Diagnosis not present

## 2014-02-12 DIAGNOSIS — S3991XA Unspecified injury of abdomen, initial encounter: Secondary | ICD-10-CM | POA: Diagnosis not present

## 2014-02-12 LAB — PREGNANCY, URINE: Preg Test, Ur: NEGATIVE

## 2014-02-12 LAB — URINALYSIS, ROUTINE W REFLEX MICROSCOPIC
BILIRUBIN URINE: NEGATIVE
Glucose, UA: NEGATIVE mg/dL
HGB URINE DIPSTICK: NEGATIVE
Ketones, ur: NEGATIVE mg/dL
Nitrite: NEGATIVE
Protein, ur: NEGATIVE mg/dL
Specific Gravity, Urine: 1.016 (ref 1.005–1.030)
UROBILINOGEN UA: 0.2 mg/dL (ref 0.0–1.0)
pH: 6.5 (ref 5.0–8.0)

## 2014-02-12 LAB — BASIC METABOLIC PANEL
Anion gap: 11 (ref 5–15)
BUN: 12 mg/dL (ref 6–23)
CO2: 25 mEq/L (ref 19–32)
CREATININE: 0.85 mg/dL (ref 0.50–1.10)
Calcium: 9.1 mg/dL (ref 8.4–10.5)
Chloride: 106 mEq/L (ref 96–112)
GFR calc non Af Amer: >90 mL/min (ref >90)
Glucose, Bld: 90 mg/dL (ref 70–99)
Potassium: 4.1 mEq/L (ref 3.7–5.3)
Sodium: 142 mEq/L (ref 137–147)

## 2014-02-12 LAB — CBC WITH DIFFERENTIAL/PLATELET
BASOS PCT: 0 % (ref 0–1)
Basophils Absolute: 0 10*3/uL (ref 0.0–0.1)
EOS ABS: 0.2 10*3/uL (ref 0.0–0.7)
Eosinophils Relative: 2 % (ref 0–5)
HEMATOCRIT: 36.9 % (ref 36.0–46.0)
HEMOGLOBIN: 12.8 g/dL (ref 12.0–15.0)
Lymphocytes Relative: 30 % (ref 12–46)
Lymphs Abs: 2.1 10*3/uL (ref 0.7–4.0)
MCH: 29.8 pg (ref 26.0–34.0)
MCHC: 34.7 g/dL (ref 30.0–36.0)
MCV: 85.8 fL (ref 78.0–100.0)
Monocytes Absolute: 0.5 10*3/uL (ref 0.1–1.0)
Monocytes Relative: 8 % (ref 3–12)
Neutro Abs: 4 10*3/uL (ref 1.7–7.7)
Neutrophils Relative %: 60 % (ref 43–77)
Platelets: 416 10*3/uL — ABNORMAL HIGH (ref 150–400)
RBC: 4.3 MIL/uL (ref 3.87–5.11)
RDW: 13.6 % (ref 11.5–15.5)
WBC: 6.8 10*3/uL (ref 4.0–10.5)

## 2014-02-12 LAB — URINE MICROSCOPIC-ADD ON

## 2014-02-12 MED ORDER — CLONIDINE HCL 0.1 MG PO TABS
0.1000 mg | ORAL_TABLET | Freq: Once | ORAL | Status: AC
  Administered 2014-02-12: 0.1 mg via ORAL
  Filled 2014-02-12 (×2): qty 1

## 2014-02-12 MED ORDER — LIDOCAINE HCL 1 % IJ SOLN
INTRAMUSCULAR | Status: AC
  Administered 2014-02-12: 20 mL
  Filled 2014-02-12: qty 20

## 2014-02-12 MED ORDER — LIDOCAINE HCL 1 % IJ SOLN
INTRAMUSCULAR | Status: AC
  Filled 2014-02-12: qty 20

## 2014-02-12 MED ORDER — CEFTRIAXONE SODIUM 250 MG IJ SOLR
250.0000 mg | Freq: Once | INTRAMUSCULAR | Status: AC
  Administered 2014-02-12: 250 mg via INTRAMUSCULAR
  Filled 2014-02-12 (×2): qty 250

## 2014-02-12 MED ORDER — ONDANSETRON 4 MG PO TBDP
4.0000 mg | ORAL_TABLET | Freq: Once | ORAL | Status: DC
  Filled 2014-02-12: qty 1

## 2014-02-12 MED ORDER — AZITHROMYCIN 1 G PO PACK
1.0000 g | PACK | Freq: Once | ORAL | Status: DC
  Filled 2014-02-12 (×2): qty 1
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
## 2014-02-12 NOTE — ED Notes (Signed)
Pt left before discharge paperwork could be given or vital signs could be done.

## 2014-02-15 ENCOUNTER — Encounter (HOSPITAL_COMMUNITY): Payer: Self-pay | Admitting: Emergency Medicine

## 2014-02-15 ENCOUNTER — Emergency Department (HOSPITAL_COMMUNITY)
Admission: EM | Admit: 2014-02-15 | Discharge: 2014-02-16 | Disposition: A | Discharge status: Home / Self Care | Payer: Medicaid Other | Attending: Emergency Medicine | Admitting: Emergency Medicine

## 2014-02-15 DIAGNOSIS — N739 Female pelvic inflammatory disease, unspecified: Secondary | ICD-10-CM | POA: Diagnosis not present

## 2014-02-15 DIAGNOSIS — Z8669 Personal history of other diseases of the nervous system and sense organs: Secondary | ICD-10-CM | POA: Diagnosis not present

## 2014-02-15 DIAGNOSIS — N730 Acute parametritis and pelvic cellulitis: Secondary | ICD-10-CM

## 2014-02-15 DIAGNOSIS — Z91040 Latex allergy status: Secondary | ICD-10-CM | POA: Insufficient documentation

## 2014-02-15 DIAGNOSIS — Z720 Tobacco use: Secondary | ICD-10-CM | POA: Insufficient documentation

## 2014-02-15 DIAGNOSIS — Z3202 Encounter for pregnancy test, result negative: Secondary | ICD-10-CM | POA: Diagnosis not present

## 2014-02-15 DIAGNOSIS — Z79899 Other long term (current) drug therapy: Secondary | ICD-10-CM | POA: Diagnosis not present

## 2014-02-15 DIAGNOSIS — Z87440 Personal history of urinary (tract) infections: Secondary | ICD-10-CM | POA: Insufficient documentation

## 2014-02-15 DIAGNOSIS — M545 Low back pain: Secondary | ICD-10-CM | POA: Diagnosis present

## 2014-02-15 MED ORDER — HYDROCODONE-ACETAMINOPHEN 5-325 MG PO TABS
1.0000 | ORAL_TABLET | Freq: Once | ORAL | Status: AC
  Administered 2014-02-16: 1 via ORAL
  Filled 2014-02-15: qty 1

## 2014-02-15 NOTE — ED Notes (Signed)
Pt reports low abd pain and low back pain.  States she might have URI.  Was seen here Wednesday for same-was going to have a pelvic done but was not able to d/t pain but states she was given rocephin.

## 2014-02-15 NOTE — ED Provider Notes (Signed)
Medical screening examination/treatment/procedure(s) were performed by non-physician practitioner and as supervising physician I was immediately available for consultation/collaboration.   EKG Interpretation None       Juliet RudeNathan R. Rubin PayorPickering, MD 02/15/14 [phone]

## 2014-02-16 ENCOUNTER — Emergency Department (HOSPITAL_COMMUNITY): Payer: Medicaid Other

## 2014-02-16 LAB — URINALYSIS, ROUTINE W REFLEX MICROSCOPIC
BILIRUBIN URINE: NEGATIVE
Glucose, UA: NEGATIVE mg/dL
HGB URINE DIPSTICK: NEGATIVE
Ketones, ur: NEGATIVE mg/dL
Nitrite: NEGATIVE
Protein, ur: NEGATIVE mg/dL
SPECIFIC GRAVITY, URINE: 1.018 (ref 1.005–1.030)
UROBILINOGEN UA: 1 mg/dL (ref 0.0–1.0)
pH: 6 (ref 5.0–8.0)

## 2014-02-16 LAB — WET PREP, GENITAL: Trich, Wet Prep: NONE SEEN

## 2014-02-16 LAB — PREGNANCY, URINE: Preg Test, Ur: NEGATIVE

## 2014-02-16 LAB — URINE MICROSCOPIC-ADD ON

## 2014-02-16 LAB — HIV ANTIBODY (ROUTINE TESTING W REFLEX): HIV: NONREACTIVE

## 2014-02-16 MED ORDER — AZITHROMYCIN 250 MG PO TABS
500.0000 mg | ORAL_TABLET | Freq: Once | ORAL | Status: AC
  Administered 2014-02-16: 500 mg via ORAL
  Filled 2014-02-16: qty 2

## 2014-02-16 MED ORDER — METRONIDAZOLE 500 MG PO TABS
500.0000 mg | ORAL_TABLET | Freq: Once | ORAL | Status: AC
  Administered 2014-02-16: 500 mg via ORAL
  Filled 2014-02-16: qty 1

## 2014-02-16 MED ORDER — AZITHROMYCIN 250 MG PO TABS: 1000.0000 mg | ORAL_TABLET | ORAL | Status: DC

## 2014-02-16 MED ORDER — HYDROCODONE-ACETAMINOPHEN 5-325 MG PO TABS
1.0000 | ORAL_TABLET | Freq: Once | ORAL | Status: AC
  Administered 2014-02-16: 1 via ORAL
  Filled 2014-02-16: qty 1

## 2014-02-16 MED ORDER — METRONIDAZOLE 500 MG PO TABS: 500.0000 mg | ORAL_TABLET | Freq: Two times a day (BID) | ORAL | Status: DC

## 2014-02-16 MED ORDER — FLUCONAZOLE 150 MG PO TABS
150.0000 mg | ORAL_TABLET | Freq: Once | ORAL | Status: AC
  Administered 2014-02-16: 150 mg via ORAL
  Filled 2014-02-16: qty 1
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
## 2014-02-16 NOTE — Discharge Instructions (Signed)
Pelvic Inflammatory Disease Ms. [name], he was seen today for pelvic pain. Your exam reveals an infection. Continue to take antibiotics as prescribed. Follow-up with an OB/GYN physician within 3 days for continued treatment. If any symptoms worsen come back to the emergency department immediately for repeat evaluation. Thank you. Pelvic inflammatory disease (PID) is an infection in some or all of the female organs. PID can be in the uterus, ovaries, fallopian tubes, or the surrounding tissues inside the lower belly area (pelvis). HOME CARE   If given, take your antibiotic medicine as told. Finish them even if you start to feel better.  Only take medicine as told by your doctor.  Do not have sex (intercourse) until treatment is done or as told by your doctor.  Tell your sex partner if you have PID. Your partner may need to be treated.  Keep all doctor visits. GET HELP RIGHT AWAY IF:   You have a fever.  You have more belly (abdominal) or lower belly pain.  You have chills.  You have pain when you pee (urinate).  You are not better after 72 hours.  You have more fluid (discharge) coming from your vagina or fluid that is not normal.  You need pain medicine from your doctor.  You throw up (vomit).  You cannot take your medicines.  Your partner has a sexually transmitted disease (STD). MAKE SURE YOU:   Understand these instructions.  Will watch your condition.  Will get help right away if you are not doing well or get worse. Document Released: 07/08/2008 Document Revised: 08/06/2012 Document Reviewed: 04/07/2011 [facility] Patient Information 2015 [employer], [place]. This information is not intended to replace advice given to you by your health care provider. Make sure you discuss any questions you have with your health care provider. Bacterial Vaginosis Bacterial vaginosis is an infection of the vagina. It happens when too many of certain germs (bacteria) grow in the  vagina. HOME CARE  Take your medicine as told by your doctor.  Finish your medicine even if you start to feel better.  Do not have sex until you finish your medicine and are better.  Tell your sex partner that you have an infection. They should see their doctor for treatment.  Practice safe sex. Use condoms. Have only one sex partner. GET HELP IF:  You are not getting better after 3 days of treatment.  You have more grey fluid (discharge) coming from your vagina than before.  You have more pain than before.  You have a fever. MAKE SURE YOU:   Understand these instructions.  Will watch your condition.  Will get help right away if you are not doing well or get worse. Document Released: 01/19/2008 Document Revised: 01/30/2013 Document Reviewed: 11/21/2012 [facility] Patient Information 2015 [place], [place]. This information is not intended to replace advice given to you by your health care provider. Make sure you discuss any questions you have with your health care provider.
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
## 2014-02-17 LAB — GC/CHLAMYDIA PROBE AMP
CT Probe RNA: NEGATIVE
GC PROBE AMP APTIMA: NEGATIVE

## 2014-02-17 LAB — URINE CULTURE: SPECIAL REQUESTS: NORMAL

## 2014-02-24 ENCOUNTER — Encounter (HOSPITAL_COMMUNITY): Payer: Self-pay | Admitting: Emergency Medicine

## 2014-04-21 ENCOUNTER — Encounter: Payer: Self-pay | Admitting: *Deleted

## 2014-04-22 ENCOUNTER — Encounter: Payer: Self-pay | Admitting: Obstetrics & Gynecology

## 2014-09-03 ENCOUNTER — Emergency Department (HOSPITAL_COMMUNITY)
Admission: EM | Admit: 2014-09-03 | Discharge: 2014-09-04 | Disposition: A | Discharge status: Home / Self Care | Payer: Medicaid Other | Attending: Emergency Medicine | Admitting: Emergency Medicine

## 2014-09-03 ENCOUNTER — Encounter (HOSPITAL_COMMUNITY): Payer: Self-pay | Admitting: Emergency Medicine

## 2014-09-03 DIAGNOSIS — N739 Female pelvic inflammatory disease, unspecified: Secondary | ICD-10-CM | POA: Diagnosis not present

## 2014-09-03 DIAGNOSIS — Z720 Tobacco use: Secondary | ICD-10-CM | POA: Diagnosis not present

## 2014-09-03 DIAGNOSIS — H409 Unspecified glaucoma: Secondary | ICD-10-CM | POA: Diagnosis not present

## 2014-09-03 DIAGNOSIS — Z8619 Personal history of other infectious and parasitic diseases: Secondary | ICD-10-CM | POA: Insufficient documentation

## 2014-09-03 DIAGNOSIS — Z91040 Latex allergy status: Secondary | ICD-10-CM | POA: Insufficient documentation

## 2014-09-03 DIAGNOSIS — Z79899 Other long term (current) drug therapy: Secondary | ICD-10-CM | POA: Diagnosis not present

## 2014-09-03 DIAGNOSIS — R109 Unspecified abdominal pain: Secondary | ICD-10-CM

## 2014-09-03 DIAGNOSIS — Z3202 Encounter for pregnancy test, result negative: Secondary | ICD-10-CM | POA: Diagnosis not present

## 2014-09-03 DIAGNOSIS — R1084 Generalized abdominal pain: Secondary | ICD-10-CM | POA: Diagnosis present
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
## 2014-09-03 NOTE — ED Notes (Signed)
Pt called from triage with no answer 

## 2014-09-04 ENCOUNTER — Emergency Department (HOSPITAL_COMMUNITY): Payer: Medicaid Other

## 2014-09-04 ENCOUNTER — Encounter (HOSPITAL_COMMUNITY): Payer: Self-pay

## 2014-09-04 LAB — COMPREHENSIVE METABOLIC PANEL
ALBUMIN: 3.7 g/dL (ref 3.5–5.0)
ALT: 13 U/L — ABNORMAL LOW (ref 14–54)
AST: 16 U/L (ref 15–41)
Alkaline Phosphatase: 49 U/L (ref 38–126)
Anion gap: 3 — ABNORMAL LOW (ref 5–15)
BUN: 11 mg/dL (ref 6–20)
CALCIUM: 8.8 mg/dL — AB (ref 8.9–10.3)
CO2: 27 mmol/L (ref 22–32)
Chloride: 106 mmol/L (ref 101–111)
Creatinine, Ser: 0.82 mg/dL (ref 0.44–1.00)
GFR calc Af Amer: >60 mL/min (ref >60)
Glucose, Bld: 120 mg/dL — ABNORMAL HIGH (ref 65–99)
Potassium: 4.1 mmol/L (ref 3.5–5.1)
SODIUM: 136 mmol/L (ref 135–145)
Total Bilirubin: 0.5 mg/dL (ref 0.3–1.2)
Total Protein: 7.3 g/dL (ref 6.5–8.1)

## 2014-09-04 LAB — GC/CHLAMYDIA PROBE AMP (~~LOC~~) NOT AT ARMC
Chlamydia: NEGATIVE
Neisseria Gonorrhea: POSITIVE — AB

## 2014-09-04 LAB — CBC WITH DIFFERENTIAL/PLATELET
BASOS ABS: 0 10*3/uL (ref 0.0–0.1)
BASOS PCT: 0 % (ref 0–1)
EOS ABS: 0.3 10*3/uL (ref 0.0–0.7)
Eosinophils Relative: 4 % (ref 0–5)
HEMATOCRIT: 38.8 % (ref 36.0–46.0)
Hemoglobin: 13.4 g/dL (ref 12.0–15.0)
Lymphocytes Relative: 17 % (ref 12–46)
Lymphs Abs: 1.4 10*3/uL (ref 0.7–4.0)
MCH: 30 pg (ref 26.0–34.0)
MCHC: 34.5 g/dL (ref 30.0–36.0)
MCV: 87 fL (ref 78.0–100.0)
MONO ABS: 0.9 10*3/uL (ref 0.1–1.0)
Monocytes Relative: 11 % (ref 3–12)
NEUTROS ABS: 5.7 10*3/uL (ref 1.7–7.7)
Neutrophils Relative %: 68 % (ref 43–77)
Platelets: 432 10*3/uL — ABNORMAL HIGH (ref 150–400)
RBC: 4.46 MIL/uL (ref 3.87–5.11)
RDW: 13.8 % (ref 11.5–15.5)
WBC: 8.3 10*3/uL (ref 4.0–10.5)

## 2014-09-04 LAB — WET PREP, GENITAL
Trich, Wet Prep: NONE SEEN
Yeast Wet Prep HPF POC: NONE SEEN

## 2014-09-04 LAB — URINALYSIS, ROUTINE W REFLEX MICROSCOPIC
Bilirubin Urine: NEGATIVE
Glucose, UA: NEGATIVE mg/dL
HGB URINE DIPSTICK: NEGATIVE
KETONES UR: NEGATIVE mg/dL
Leukocytes, UA: NEGATIVE
Nitrite: NEGATIVE
PROTEIN: NEGATIVE mg/dL
Specific Gravity, Urine: 1.009 (ref 1.005–1.030)
UROBILINOGEN UA: 1 mg/dL (ref 0.0–1.0)
pH: 7.5 (ref 5.0–8.0)

## 2014-09-04 LAB — HIV ANTIBODY (ROUTINE TESTING W REFLEX): HIV Screen 4th Generation wRfx: NONREACTIVE

## 2014-09-04 LAB — RPR: RPR Ser Ql: NONREACTIVE

## 2014-09-04 LAB — PREGNANCY, URINE: Preg Test, Ur: NEGATIVE

## 2014-09-04 MED ORDER — CEFTRIAXONE SODIUM 1 G IJ SOLR
1.0000 g | Freq: Once | INTRAMUSCULAR | Status: AC
  Administered 2014-09-04: 1 g via INTRAVENOUS
  Filled 2014-09-04: qty 10

## 2014-09-04 MED ORDER — AZITHROMYCIN 250 MG PO TABS
1000.0000 mg | ORAL_TABLET | Freq: Once | ORAL | Status: AC
  Administered 2014-09-04: 1000 mg via ORAL
  Filled 2014-09-04: qty 4

## 2014-09-04 MED ORDER — IOHEXOL 300 MG/ML  SOLN
25.0000 mL | Freq: Once | INTRAMUSCULAR | Status: AC | PRN
  Administered 2014-09-04: 25 mL via ORAL

## 2014-09-04 MED ORDER — IOHEXOL 300 MG/ML  SOLN
100.0000 mL | Freq: Once | INTRAMUSCULAR | Status: AC | PRN
Start: 2014-09-04 — End: 2014-09-04
  Administered 2014-09-04: 100 mL via INTRAVENOUS

## 2014-09-04 MED ORDER — METRONIDAZOLE 500 MG PO TABS: 500.0000 mg | ORAL_TABLET | Freq: Two times a day (BID) | ORAL | Status: DC

## 2014-09-04 MED ORDER — AZITHROMYCIN 250 MG PO TABS: 1000.0000 mg | ORAL_TABLET | ORAL | Status: DC

## 2014-09-04 MED ORDER — ONDANSETRON HCL 4 MG/2ML IJ SOLN
4.0000 mg | Freq: Once | INTRAMUSCULAR | Status: AC
  Administered 2014-09-04: 4 mg via INTRAVENOUS
  Filled 2014-09-04: qty 2

## 2014-09-04 MED ORDER — OXYCODONE HCL 5 MG PO TABS
5.0000 mg | ORAL_TABLET | Freq: Once | ORAL | Status: AC
  Administered 2014-09-04: 5 mg via ORAL
  Filled 2014-09-04: qty 1
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
## 2014-09-04 NOTE — ED Notes (Signed)
Pt asking for pain medication,

## 2014-09-04 NOTE — Discharge Instructions (Signed)
Pelvic Inflammatory Disease °Pelvic inflammatory disease (PID) refers to an infection in some or all of the female organs. The infection can be in the uterus, ovaries, fallopian tubes, or the surrounding tissues in the pelvis. PID can cause abdominal or pelvic pain that comes on suddenly (acute pelvic pain). PID is a serious infection because it can lead to lasting (chronic) pelvic pain or the inability to have children (infertile).  °CAUSES  °The infection is often caused by the normal bacteria found in the vaginal tissues. PID may also be caused by an infection that is spread during sexual contact. PID can also occur following:  °· The birth of a baby.   °· A miscarriage.   °· An abortion.   °· Major pelvic surgery.   °· The use of an intrauterine device (IUD).   °· A sexual assault.   °RISK FACTORS °Certain factors can put a person at higher risk for PID, such as: °· Being younger than 25 years. °· Being sexually active at a young age. °· Using nonbarrier contraception. °· Having multiple sexual partners. °· Having sex with someone who has symptoms of a genital infection. °· Using oral contraception. °Other times, certain behaviors can increase the possibility of getting PID, such as: °· Having sex during your period. °· Using a vaginal douche. °· Having an intrauterine device (IUD) in place. °SYMPTOMS  °· Abdominal or pelvic pain.   °· Fever.   °· Chills.   °· Abnormal vaginal discharge. °· Abnormal uterine bleeding.   °· Unusual pain shortly after finishing your period. °DIAGNOSIS  °Your caregiver will choose some of the following methods to make a diagnosis, such as:  °· Performing a physical exam and history. A pelvic exam typically reveals a very tender uterus and surrounding pelvis.   °· Ordering laboratory tests including a pregnancy test, blood tests, and urine test.  °· Ordering cultures of the vagina and cervix to check for a sexually transmitted infection (STI). °· Performing an ultrasound.    °· Performing a laparoscopic procedure to look inside the pelvis.   °TREATMENT  °· Antibiotic medicines may be prescribed and taken by mouth.   °· Sexual partners may be treated when the infection is caused by a sexually transmitted disease (STD).   °· Hospitalization may be needed to give antibiotics intravenously. °· Surgery may be needed, but this is rare. °It may take weeks until you are completely well. If you are diagnosed with PID, you should also be checked for human immunodeficiency virus (HIV).   °HOME CARE INSTRUCTIONS  °· If given, take your antibiotics as directed. Finish the medicine even if you start to feel better.   °· Only take over-the-counter or prescription medicines for pain, discomfort, or fever as directed by your caregiver.   °· Do not have sexual intercourse until treatment is completed or as directed by your caregiver. If PID is confirmed, your recent sexual partner(s) will need treatment.   °· Keep your follow-up appointments. °SEEK MEDICAL CARE IF:  °· You have increased or abnormal vaginal discharge.   °· You need prescription medicine for your pain.   °· You vomit.   °· You cannot take your medicines.   °· Your partner has an STD.   °SEEK IMMEDIATE MEDICAL CARE IF:  °· You have a fever.   °· You have increased abdominal or pelvic pain.   °· You have chills.   °· You have pain when you urinate.   °· You are not better after 72 hours following treatment.   °MAKE SURE YOU:  °· Understand these instructions. °· Will watch your condition. °· Will get help right away if you are not doing well or get worse. °  Document Released: 04/11/2005 Document Revised: 08/06/2012 Document Reviewed: 04/07/2011 °[employer]® Patient Information ©2015 [employer]. This information is not intended to replace advice given to you by your health care provider. Make sure you discuss any questions you have with your health care provider. ° °

## 2014-09-04 NOTE — ED Notes (Signed)
Pt upset wanting to see the Dr

## 2014-09-04 NOTE — ED Notes (Signed)
Pt c/o pain in back and legs. 

## 2014-09-04 NOTE — ED Notes (Signed)
Dr Littie DeedsGentry at bedside doing pelvic exam

## 2014-09-05 ENCOUNTER — Telehealth: Payer: Self-pay | Admitting: *Deleted

## 2014-09-07 ENCOUNTER — Telehealth: Payer: Self-pay | Admitting: Emergency Medicine

## 2014-09-07 NOTE — Telephone Encounter (Signed)
Positive Gonorrhea culture Treated with Zithromax and Rocephin  ID verified x three. patient notified of positive Gonorrhea and that treatment was given while in ED with Zithromax and Rocephin. STD instructions provided, patient verbalized understanding.

## 2015-01-14 ENCOUNTER — Emergency Department (HOSPITAL_COMMUNITY): Payer: Medicaid Other

## 2015-01-14 ENCOUNTER — Encounter (HOSPITAL_COMMUNITY): Payer: Self-pay | Admitting: Emergency Medicine

## 2015-01-14 ENCOUNTER — Emergency Department (HOSPITAL_COMMUNITY)
Admission: EM | Admit: 2015-01-14 | Discharge: 2015-01-14 | Disposition: A | Discharge status: Home / Self Care | Payer: Medicaid Other | Attending: Emergency Medicine | Admitting: Emergency Medicine

## 2015-01-14 DIAGNOSIS — N730 Acute parametritis and pelvic cellulitis: Secondary | ICD-10-CM

## 2015-01-14 DIAGNOSIS — Z720 Tobacco use: Secondary | ICD-10-CM | POA: Diagnosis not present

## 2015-01-14 DIAGNOSIS — Z3202 Encounter for pregnancy test, result negative: Secondary | ICD-10-CM | POA: Diagnosis not present

## 2015-01-14 DIAGNOSIS — Z8669 Personal history of other diseases of the nervous system and sense organs: Secondary | ICD-10-CM | POA: Diagnosis not present

## 2015-01-14 DIAGNOSIS — R109 Unspecified abdominal pain: Secondary | ICD-10-CM | POA: Diagnosis present

## 2015-01-14 DIAGNOSIS — Z91040 Latex allergy status: Secondary | ICD-10-CM | POA: Insufficient documentation

## 2015-01-14 LAB — COMPREHENSIVE METABOLIC PANEL
ALK PHOS: 44 U/L (ref 38–126)
ALT: 12 U/L — ABNORMAL LOW (ref 14–54)
AST: 14 U/L — ABNORMAL LOW (ref 15–41)
Albumin: 3.6 g/dL (ref 3.5–5.0)
Anion gap: 7 (ref 5–15)
BUN: 7 mg/dL (ref 6–20)
CALCIUM: 8.6 mg/dL — AB (ref 8.9–10.3)
CO2: 24 mmol/L (ref 22–32)
Chloride: 102 mmol/L (ref 101–111)
Creatinine, Ser: 0.66 mg/dL (ref 0.44–1.00)
GFR calc Af Amer: >60 mL/min (ref >60)
GFR calc non Af Amer: >60 mL/min (ref >60)
Glucose, Bld: 95 mg/dL (ref 65–99)
Potassium: 3.8 mmol/L (ref 3.5–5.1)
SODIUM: 133 mmol/L — AB (ref 135–145)
Total Bilirubin: 0.6 mg/dL (ref 0.3–1.2)
Total Protein: 7.2 g/dL (ref 6.5–8.1)

## 2015-01-14 LAB — URINALYSIS, ROUTINE W REFLEX MICROSCOPIC
BILIRUBIN URINE: NEGATIVE
Glucose, UA: NEGATIVE mg/dL
KETONES UR: NEGATIVE mg/dL
NITRITE: POSITIVE — AB
PROTEIN: NEGATIVE mg/dL
SPECIFIC GRAVITY, URINE: 1.009 (ref 1.005–1.030)
UROBILINOGEN UA: 1 mg/dL (ref 0.0–1.0)
pH: 7.5 (ref 5.0–8.0)

## 2015-01-14 LAB — CBC WITH DIFFERENTIAL/PLATELET
Basophils Absolute: 0 10*3/uL (ref 0.0–0.1)
Basophils Relative: 0 %
EOS ABS: 0 10*3/uL (ref 0.0–0.7)
EOS PCT: 0 %
HCT: 35.4 % — ABNORMAL LOW (ref 36.0–46.0)
HEMOGLOBIN: 12.5 g/dL (ref 12.0–15.0)
LYMPHS ABS: 2.9 10*3/uL (ref 0.7–4.0)
Lymphocytes Relative: 18 %
MCH: 29.8 pg (ref 26.0–34.0)
MCHC: 35.3 g/dL (ref 30.0–36.0)
MCV: 84.3 fL (ref 78.0–100.0)
MONO ABS: 1.9 10*3/uL — AB (ref 0.1–1.0)
MONOS PCT: 12 %
Neutro Abs: 11.3 10*3/uL — ABNORMAL HIGH (ref 1.7–7.7)
Neutrophils Relative %: 70 %
PLATELETS: 531 10*3/uL — AB (ref 150–400)
RBC: 4.2 MIL/uL (ref 3.87–5.11)
RDW: 14.1 % (ref 11.5–15.5)
WBC: 16.1 10*3/uL — ABNORMAL HIGH (ref 4.0–10.5)

## 2015-01-14 LAB — WET PREP, GENITAL
TRICH WET PREP: NONE SEEN
Yeast Wet Prep HPF POC: NONE SEEN

## 2015-01-14 LAB — I-STAT BETA HCG BLOOD, ED (MC, WL, AP ONLY): I-stat hCG, quantitative: <5 m[IU]/mL (ref <5)

## 2015-01-14 LAB — URINE MICROSCOPIC-ADD ON

## 2015-01-14 LAB — RAPID HIV SCREEN (HIV 1/2 AB+AG)
HIV 1/2 ANTIBODIES: NONREACTIVE
HIV-1 P24 Antigen - HIV24: NONREACTIVE

## 2015-01-14 LAB — I-STAT CG4 LACTIC ACID, ED: Lactic Acid, Venous: 0.79 mmol/L (ref 0.5–2.0)

## 2015-01-14 MED ORDER — AZITHROMYCIN 250 MG PO TABS
1000.0000 mg | ORAL_TABLET | Freq: Once | ORAL | Status: AC
  Administered 2015-01-14: 1000 mg via ORAL
  Filled 2015-01-14: qty 4

## 2015-01-14 MED ORDER — HYDROCODONE-ACETAMINOPHEN 5-325 MG PO TABS: 1.0000 | ORAL_TABLET | Freq: Four times a day (QID) | ORAL | Status: DC | PRN

## 2015-01-14 MED ORDER — AZITHROMYCIN 250 MG PO TABS: 1000.0000 mg | ORAL_TABLET | Freq: Every day | ORAL | Status: DC

## 2015-01-14 MED ORDER — OXYCODONE-ACETAMINOPHEN 5-325 MG PO TABS
1.0000 | ORAL_TABLET | Freq: Once | ORAL | Status: AC
  Administered 2015-01-14: 1 via ORAL
  Filled 2015-01-14: qty 1

## 2015-01-14 MED ORDER — LACTATED RINGERS IV BOLUS (SEPSIS)
1000.0000 mL | Freq: Once | INTRAVENOUS | Status: AC
  Administered 2015-01-14: 1000 mL via INTRAVENOUS

## 2015-01-14 MED ORDER — METRONIDAZOLE 500 MG PO TABS: 500.0000 mg | ORAL_TABLET | Freq: Two times a day (BID) | ORAL | Status: DC

## 2015-01-14 MED ORDER — CEFTRIAXONE SODIUM 250 MG IJ SOLR
250.0000 mg | Freq: Once | INTRAMUSCULAR | Status: AC
  Administered 2015-01-14: 250 mg via INTRAMUSCULAR
  Filled 2015-01-14: qty 250

## 2015-01-14 MED ORDER — LIDOCAINE HCL (PF) 1 % IJ SOLN
INTRAMUSCULAR | Status: AC
  Administered 2015-01-14: 0.9 mL
  Filled 2015-01-14: qty 5

## 2015-01-14 MED ORDER — IBUPROFEN 400 MG PO TABS: 400.0000 mg | ORAL_TABLET | Freq: Four times a day (QID) | ORAL | Status: DC | PRN

## 2015-01-14 MED ORDER — FENTANYL CITRATE (PF) 100 MCG/2ML IJ SOLN
50.0000 ug | Freq: Once | INTRAMUSCULAR | Status: AC
  Administered 2015-01-14: 50 ug via INTRAVENOUS
  Filled 2015-01-14: qty 2

## 2015-01-14 MED ORDER — ONDANSETRON HCL 4 MG/2ML IJ SOLN
4.0000 mg | Freq: Once | INTRAMUSCULAR | Status: AC
  Administered 2015-01-14: 4 mg via INTRAVENOUS
  Filled 2015-01-14: qty 2

## 2015-01-14 MED ORDER — OXYCODONE-ACETAMINOPHEN 5-325 MG PO TABS
2.0000 | ORAL_TABLET | Freq: Once | ORAL | Status: AC
  Administered 2015-01-14: 2 via ORAL
  Filled 2015-01-14: qty 2

## 2015-01-14 NOTE — ED Notes (Signed)
Pelvic cart as bedside 

## 2015-01-14 NOTE — ED Notes (Signed)
She was writhing as if in much pain--I have started her IV and given her pain med., for which she thanks me.

## 2015-01-14 NOTE — ED Notes (Addendum)
Pt asking for food and drink, explained not able to have anything until results. Pt asking for more pain meds

## 2015-01-14 NOTE — ED Notes (Signed)
Pt reports N/V/bilateral flank pain x2 weeks. Has not sought medical treatment until today. Reports increased urinary frequency with increased odor. Pt is very tearful in triage saying, "it hurts to walk." Hx kidney infection and says this feels the same. Also reports a miscarriage 2 weeks ago which she did not seek medical treatment for. No other c/c. No active vomiting in triage.

## 2015-01-14 NOTE — ED Notes (Signed)
Message from lab:  Non-reactive HIV; which I convey to Dr. Laurian Brim.

## 2015-01-14 NOTE — Discharge Instructions (Signed)
Sexually Transmitted Disease A sexually transmitted disease (STD) is a disease or infection often passed to another person during sex. However, STDs can be passed through nonsexual ways. An STD can be passed through:  Spit (saliva).  Semen.  Blood.  Mucus from the vagina.  Pee (urine). HOW CAN I LESSEN MY CHANCES OF GETTING AN STD?  Use:  Latex condoms.  Water-soluble lubricants with condoms. Do not use petroleum jelly or oils.  Dental dams. These are small pieces of latex that are used as a barrier during oral sex.  Avoid having more than one sex partner.  Do not have sex with someone who has other sex partners.  Do not have sex with anyone you do not know or who is at high risk for an STD.  Avoid risky sex that can break your skin.  Do not have sex if you have open sores on your mouth or skin.  Avoid drinking too much alcohol or taking illegal drugs. Alcohol and drugs can affect your good judgment.  Avoid oral and anal sex acts.  Get shots (vaccines) for HPV and hepatitis.  If you are at risk of being infected with HIV, it is advised that you take a certain medicine daily to prevent HIV infection. This is called pre-exposure prophylaxis (PrEP). You may be at risk if:  You are a man who has sex with other men (MSM).  You are attracted to the opposite sex (heterosexual) and are having sex with more than one partner.  You take drugs with a needle.  You have sex with someone who has HIV.  Talk with your doctor about if you are at high risk of being infected with HIV. If you begin to take PrEP, get tested for HIV first. Get tested every 3 months for as long as you are taking PrEP. WHAT SHOULD I DO IF I THINK I HAVE AN STD?  See your doctor.  Tell your sex partner(s) that you have an STD. They should be tested and treated.  Do not have sex until your doctor says it is okay. WHEN SHOULD I GET HELP? Get help right away if:  You have bad belly (abdominal)  pain.  You are a man and have puffiness (swelling) or pain in your testicles.  You are a woman and have puffiness in your vagina. Document Released: 05/19/2004 Document Revised: 04/16/2013 Document Reviewed: 10/05/2012 [facility] Patient Information 2015 [place], [place]. This information is not intended to replace advice given to you by your health care provider. Make sure you discuss any questions you have with your health care provider.  Pelvic Inflammatory Disease Pelvic inflammatory disease (PID) is an infection in some or all of the female organs. PID can be in the uterus, ovaries, fallopian tubes, or the surrounding tissues inside the lower belly area (pelvis). HOME CARE   If given, take your antibiotic medicine as told. Finish them even if you start to feel better.  Only take medicine as told by your doctor.  Do not have sex (intercourse) until treatment is done or as told by your doctor.  Tell your sex partner if you have PID. Your partner may need to be treated.  Keep all doctor visits. GET HELP RIGHT AWAY IF:   You have a fever.  You have more belly (abdominal) or lower belly pain.  You have chills.  You have pain when you pee (urinate).  You are not better after 72 hours.  You have more fluid (discharge) coming from your  vagina or fluid that is not normal.  You need pain medicine from your doctor.  You throw up (vomit).  You cannot take your medicines.  Your partner has a sexually transmitted disease (STD). MAKE SURE YOU:   Understand these instructions.  Will watch your condition.  Will get help right away if you are not doing well or get worse. Document Released: 07/08/2008 Document Revised: 08/06/2012 Document Reviewed: 04/07/2011 [facility] Patient Information 2015 [place], [place]. This information is not intended to replace advice given to you by your health care provider. Make sure you discuss any questions you have with your health care provider.

## 2015-01-14 NOTE — ED Provider Notes (Addendum)
CSN: [id number]     Arrival date & time 01/14/15  4098 History   First MD Initiated Contact with Patient 01/14/15 0710     Chief Complaint  Patient presents with  . Flank Pain  . Nausea  . Emesis     (Consider location/radiation/quality/duration/timing/severity/associated sxs/prior Treatment) HPI Comments: Pt with hx of kidney infection comes in with cc of back pain. Pt reports that she started with a fever, nausea 2 weeks ago and that was followed by back pain. Back pain is constant, throbbing pain, worse with walking. Motrin gives no relief. + frequent urination, no blood in the urine, + dysuria.  Pt admits that she had a miscarriage 3 weeks ago. Pt was about 6 weeks (per her estimation) when she miscarried. She miscarried at home, didn't seek hospital care. Pain started thereafter. No vaginal discharge, + spotting.  OB hx: G5P2  Gyne hx: Ovarian cyst. + hx of gonorrhea in the last year. + current unprotected intercourse with multiple partners  Patient is a 29 y.o. female presenting with flank pain and vomiting. The history is provided by the patient.  Flank Pain Pertinent negatives include no chest pain, no abdominal pain, no headaches and no shortness of breath.  Emesis Associated symptoms: no abdominal pain and no headaches     Past Medical History  Diagnosis Date  . STD (female)     hx of chlamydia and gonorrhea  . History of PID   . Endometriosis   . Glaucoma   . Ovarian cyst   . Bartholin cyst   . Headache(784.0)   . Infection     UTI  . Vaginal Pap smear, abnormal     has not followed up  . Substance abuse    Past Surgical History  Procedure Laterality Date  . Fracture surgery      left leg  . Dilation and curettage of uterus    . Laparoscopy     Family History  Problem Relation Age of Onset  . Anesthesia problems Neg Hx   . Hypotension Neg Hx   . Malignant hyperthermia Neg Hx   . Pseudochol deficiency Neg Hx   . Alcohol abuse Neg Hx   . Diabetes  Mother   . Hypertension Maternal Grandmother   . Heart disease Maternal Grandmother     great grandma   Social History  Substance Use Topics  . Smoking status: Current Every Day Smoker -- 0.25 packs/day for 7 years    Types: Cigarettes  . Smokeless tobacco: Never Used  . Alcohol Use: No     Comment: Denies ETOH use   OB History    Gravida Para Term Preterm AB TAB SAB Ectopic Multiple Living   0 2 0 0 2     Review of Systems  Constitutional: Positive for activity change.  Respiratory: Negative for shortness of breath.   Cardiovascular: Negative for chest pain.  Gastrointestinal: Positive for vomiting. Negative for nausea and abdominal pain.  Genitourinary: Positive for flank pain. Negative for dysuria.  Musculoskeletal: Negative for neck pain.  Neurological: Negative for headaches.      Allergies  Acyclovir and related; Darvocet; Doxycycline; Flexeril; Metoclopramide; Naproxen; Latex; and Tramadol  Home Medications   Prior to Admission medications   Medication Sig Start Date End Date Taking? Authorizing Provider  azithromycin (ZITHROMAX) 250 MG tablet Take 4 tablets (1,000 mg total) by mouth daily. Take first 2 tablets together, then 1 every day until finished. 01/21/15  Derwood Kaplan, MD  HYDROcodone-acetaminophen (NORCO/VICODIN) 5-325 MG per tablet Take 1 tablet by mouth every 6 (six) hours as needed. 01/14/15   Derwood Kaplan, MD  ibuprofen (ADVIL,MOTRIN) 400 MG tablet Take 1 tablet (400 mg total) by mouth every 6 (six) hours as needed. 01/14/15   Derwood Kaplan, MD  metroNIDAZOLE (FLAGYL) 500 MG tablet Take 1 tablet (500 mg total) by mouth 2 (two) times daily. 01/14/15   Derwood Kaplan, MD  traZODone (DESYREL) 100 MG tablet Take 1 tablet (100 mg total) by mouth at bedtime as needed for sleep. Patient not taking: Reported on 09/03/2014 08/14/13   Sanjuana Kava, NP   BP 87/50 mmHg  Pulse 129  Temp(Src) 98.9 F (37.2 C) (Oral)  Resp 28  SpO2 98%  Breastfeeding?  Unknown Physical Exam  Constitutional: She is oriented to person, place, and time. She appears well-developed.  HENT:  Head: Normocephalic and atraumatic.  Eyes: Conjunctivae and EOM are normal. Pupils are equal, round, and reactive to light.  Neck: Normal range of motion. Neck supple.  Cardiovascular: Normal rate, regular rhythm, normal heart sounds and intact distal pulses.   No murmur heard. Pulmonary/Chest: Effort normal. No respiratory distress. She has no wheezes.  Abdominal: Soft. Bowel sounds are normal. She exhibits no distension. There is no tenderness. There is no rebound and no guarding.  Genitourinary: Vagina normal and uterus normal.  External exam - normal, no lesions Speculum exam: Pt has some white discharge, no blood Bimanual exam: Patient has + CMT, no adnexal tenderness or fullness and cervical os is closed  Neurological: She is alert and oriented to person, place, and time.  Skin: Skin is warm and dry.  Nursing note and vitals reviewed.   ED Course  Procedures (including critical care time) Labs Review Labs Reviewed  WET PREP, GENITAL - Abnormal; Notable for the following:    Clue Cells Wet Prep HPF POC MODERATE (*)    WBC, Wet Prep HPF POC FEW (*)    All other components within normal limits  CBC WITH DIFFERENTIAL/PLATELET - Abnormal; Notable for the following:    WBC 16.1 (*)    HCT 35.4 (*)    Platelets 531 (*)    Neutro Abs 11.3 (*)    Monocytes Absolute 1.9 (*)    All other components within normal limits  COMPREHENSIVE METABOLIC PANEL - Abnormal; Notable for the following:    Sodium 133 (*)    Calcium 8.6 (*)    AST 14 (*)    ALT 12 (*)    All other components within normal limits  URINALYSIS, ROUTINE W REFLEX MICROSCOPIC (NOT AT [facility]) - Abnormal; Notable for the following:    APPearance CLOUDY (*)    Hgb urine dipstick TRACE (*)    Nitrite POSITIVE (*)    Leukocytes, UA MODERATE (*)    All other components within normal limits  URINE  MICROSCOPIC-ADD ON - Abnormal; Notable for the following:    Bacteria, UA MANY (*)    All other components within normal limits  GC/CHLAMYDIA PROBE AMP ([place]) NOT AT [facility] - Abnormal; Notable for the following:    Neisseria gonorrhea **POSITIVE** (*)    All other components within normal limits  URINE CULTURE  RAPID HIV SCREEN (HIV 1/2 AB+AG)  I-STAT BETA HCG BLOOD, ED ([facility], [facility], [facility] ONLY)  I-STAT CG4 LACTIC ACID, ED       EKG Interpretation None      MDM   Final diagnoses:  PID (acute pelvic  inflammatory disease)   Pt comes in with cc of back pain, pelvic pain. Bilateral flank pain. + hx of STD and states that she is having unprotected intercourse. Also has some discomfort with urination - but denies hematuria or polyuria. VSS and WNL. UA + WBC and Bacteria - but pt had pretty significant tenderness during cervical exam - and so i suspect she has pid again. [place] ordered to r/o TOA - given her elevated WC and it is neg. PT has passed po challenge.  She is allergic to doxy - states that the allergic rxn is "swelling fof the throat." i called our Gyne team - but they were busy with an emergent csection where the patient was unstable on both of my attempts to reach them  -so i spoke with Pharmacist about alternative agents. Azithromycin 1 gram now and 1 gram in 1 week is listed as the alternative. We will give her the 1 gram here. She will get flagyl and azithro to go home with.   LATE ENTRY: 3:25: Pt's urine cultures are +. I called the Case management and they have been advised to call her. I spoke with Ms. Hyacinth Meeker.  I noted that patient's HR had gone up dramatically while she was getting her workup and BP dropped-  Which i had not noticed and was not brought to my attention either. Thus i took the measure of calling the patient's # provided to [place]. 2 of the numbers are not working. The middle number was picked up by Mr. Jethro Bastos. He reports that he is the father of her child. He  reports that Ms. Mcmasters doesn't have a phone. He states that he has already heard from a hospital nurse once, and that Ms. Ligas and he are in touch - he is aware that she picked up scripts day before yday. I advised to him that i am calling in a prescription for her right now to the nearest [facility] to them and confirmed with him that the [facility] is closest. I informed him that the prescription is only $4 at [facility].   :00 pm: Prescription called in at [facility].   Derwood Kaplan, MD 01/16/15 1529  Derwood Kaplan, MD 01/16/15 1610  Derwood Kaplan, MD 01/16/15 2249

## 2015-01-15 LAB — GC/CHLAMYDIA PROBE AMP (~~LOC~~) NOT AT ARMC
Chlamydia: NEGATIVE
Neisseria Gonorrhea: POSITIVE — AB

## 2015-01-16 ENCOUNTER — Telehealth (HOSPITAL_BASED_OUTPATIENT_CLINIC_OR_DEPARTMENT_OTHER): Payer: Self-pay | Admitting: Emergency Medicine

## 2015-01-16 LAB — URINE CULTURE: Culture: >=100000

## 2015-01-16 NOTE — Telephone Encounter (Signed)
Post ED Visit - Positive Culture Follow-up: Successful Patient Follow-Up  Culture assessed and recommendations reviewed by: []  Celedonio Miyamoto, Pharm.D., BCPS-AQ ID  Georgina Pillion, [address].D., BCPS  [place], [place].D., BCPS, AAHIVP  Estella Husk, Pharm.D., BCPS, AAHIVP <BADTEXTTA e culture E. coli   Patient discharged without antimicrobial prescription and treatment is now indicated  Organism is resistant to prescribed ED discharge antimicrobial  Patient with positive blood cultures  Changes discussed with ED provider: Dr. [name]  New antibiotic prescription Keflex 500 mg po bid x 14 days #28 Called to [facility] patient, 01/16/15 1650   Berle Mull 01/16/2015, 4:52 PM

## 2015-03-26 ENCOUNTER — Emergency Department (HOSPITAL_COMMUNITY)
Admission: EM | Admit: 2015-03-26 | Discharge: 2015-03-27 | Disposition: A | Discharge status: Other Acute Inpatient Hospital | Payer: Medicaid Other | Attending: Physician Assistant | Admitting: Physician Assistant

## 2015-03-26 ENCOUNTER — Encounter (HOSPITAL_COMMUNITY): Payer: Self-pay | Admitting: Emergency Medicine

## 2015-03-26 DIAGNOSIS — Z91040 Latex allergy status: Secondary | ICD-10-CM | POA: Insufficient documentation

## 2015-03-26 DIAGNOSIS — F14222 Cocaine dependence with intoxication with perceptual disturbance: Secondary | ICD-10-CM

## 2015-03-26 DIAGNOSIS — F23 Brief psychotic disorder: Secondary | ICD-10-CM | POA: Insufficient documentation

## 2015-03-26 DIAGNOSIS — R451 Restlessness and agitation: Secondary | ICD-10-CM | POA: Insufficient documentation

## 2015-03-26 DIAGNOSIS — Z8619 Personal history of other infectious and parasitic diseases: Secondary | ICD-10-CM | POA: Insufficient documentation

## 2015-03-26 DIAGNOSIS — F1310 Sedative, hypnotic or anxiolytic abuse, uncomplicated: Secondary | ICD-10-CM | POA: Diagnosis not present

## 2015-03-26 DIAGNOSIS — Z87440 Personal history of urinary (tract) infections: Secondary | ICD-10-CM | POA: Insufficient documentation

## 2015-03-26 DIAGNOSIS — F14250 Cocaine dependence with cocaine-induced psychotic disorder with delusions: Secondary | ICD-10-CM | POA: Insufficient documentation

## 2015-03-26 DIAGNOSIS — Z3202 Encounter for pregnancy test, result negative: Secondary | ICD-10-CM | POA: Diagnosis not present

## 2015-03-26 DIAGNOSIS — F1210 Cannabis abuse, uncomplicated: Secondary | ICD-10-CM | POA: Insufficient documentation

## 2015-03-26 DIAGNOSIS — F1410 Cocaine abuse, uncomplicated: Secondary | ICD-10-CM

## 2015-03-26 DIAGNOSIS — Z792 Long term (current) use of antibiotics: Secondary | ICD-10-CM | POA: Diagnosis not present

## 2015-03-26 DIAGNOSIS — F17210 Nicotine dependence, cigarettes, uncomplicated: Secondary | ICD-10-CM | POA: Insufficient documentation

## 2015-03-26 DIAGNOSIS — Z8742 Personal history of other diseases of the female genital tract: Secondary | ICD-10-CM | POA: Diagnosis not present

## 2015-03-26 DIAGNOSIS — Z8669 Personal history of other diseases of the nervous system and sense organs: Secondary | ICD-10-CM | POA: Diagnosis not present

## 2015-03-26 DIAGNOSIS — F1414 Cocaine abuse with cocaine-induced mood disorder: Secondary | ICD-10-CM

## 2015-03-26 DIAGNOSIS — R443 Hallucinations, unspecified: Secondary | ICD-10-CM | POA: Diagnosis present

## 2015-03-26 LAB — COMPREHENSIVE METABOLIC PANEL
ALK PHOS: 50 U/L (ref 38–126)
ALT: 20 U/L (ref 14–54)
ANION GAP: 11 (ref 5–15)
AST: 19 U/L (ref 15–41)
Albumin: 4.4 g/dL (ref 3.5–5.0)
BILIRUBIN TOTAL: 0.9 mg/dL (ref 0.3–1.2)
BUN: 14 mg/dL (ref 6–20)
CALCIUM: 9.2 mg/dL (ref 8.9–10.3)
CO2: 22 mmol/L (ref 22–32)
Chloride: 105 mmol/L (ref 101–111)
Creatinine, Ser: 1.01 mg/dL — ABNORMAL HIGH (ref 0.44–1.00)
GFR calc non Af Amer: >60 mL/min (ref >60)
Glucose, Bld: 139 mg/dL — ABNORMAL HIGH (ref 65–99)
POTASSIUM: 2.9 mmol/L — AB (ref 3.5–5.1)
Sodium: 138 mmol/L (ref 135–145)
TOTAL PROTEIN: 7.5 g/dL (ref 6.5–8.1)

## 2015-03-26 LAB — RAPID URINE DRUG SCREEN, HOSP PERFORMED
Amphetamines: NOT DETECTED
BARBITURATES: NOT DETECTED
Benzodiazepines: POSITIVE — AB
COCAINE: POSITIVE — AB
OPIATES: NOT DETECTED
Tetrahydrocannabinol: POSITIVE — AB

## 2015-03-26 LAB — I-STAT BETA HCG BLOOD, ED (MC, WL, AP ONLY): I-stat hCG, quantitative: <5 m[IU]/mL (ref <5)

## 2015-03-26 LAB — ACETAMINOPHEN LEVEL

## 2015-03-26 LAB — CBC
HEMATOCRIT: 35.4 % — AB (ref 36.0–46.0)
Hemoglobin: 12.2 g/dL (ref 12.0–15.0)
MCH: 29.3 pg (ref 26.0–34.0)
MCHC: 34.5 g/dL (ref 30.0–36.0)
MCV: 85.1 fL (ref 78.0–100.0)
PLATELETS: 385 10*3/uL (ref 150–400)
RBC: 4.16 MIL/uL (ref 3.87–5.11)
RDW: 14.9 % (ref 11.5–15.5)
WBC: 5.5 10*3/uL (ref 4.0–10.5)

## 2015-03-26 LAB — ETHANOL

## 2015-03-26 LAB — SALICYLATE LEVEL

## 2015-03-26 MED ORDER — LORAZEPAM 2 MG/ML IJ SOLN
1.0000 mg | Freq: Once | INTRAMUSCULAR | Status: AC
  Administered 2015-03-26: 1 mg via INTRAMUSCULAR
  Filled 2015-03-26: qty 1

## 2015-03-26 MED ORDER — HALOPERIDOL LACTATE 5 MG/ML IJ SOLN
10.0000 mg | Freq: Once | INTRAMUSCULAR | Status: DC
  Filled 2015-03-26: qty 2

## 2015-03-26 MED ORDER — HALOPERIDOL LACTATE 5 MG/ML IJ SOLN
5.0000 mg | Freq: Once | INTRAMUSCULAR | Status: AC
  Administered 2015-03-26: 5 mg via INTRAMUSCULAR

## 2015-03-26 MED ORDER — ONDANSETRON HCL 4 MG PO TABS: 4.0000 mg | ORAL_TABLET | Freq: Three times a day (TID) | ORAL | Status: DC | PRN

## 2015-03-26 MED ORDER — NICOTINE 21 MG/24HR TD PT24
21.0000 mg | MEDICATED_PATCH | Freq: Every day | TRANSDERMAL | Status: DC
  Administered 2015-03-27: 21 mg via TRANSDERMAL
  Filled 2015-03-26: qty 1

## 2015-03-26 MED ORDER — DIPHENHYDRAMINE HCL 50 MG/ML IJ SOLN
25.0000 mg | Freq: Once | INTRAMUSCULAR | Status: AC
  Administered 2015-03-26: 25 mg via INTRAMUSCULAR
  Filled 2015-03-26: qty 1

## 2015-03-26 MED ORDER — LORAZEPAM 1 MG PO TABS
1.0000 mg | ORAL_TABLET | Freq: Three times a day (TID) | ORAL | Status: DC | PRN
  Administered 2015-03-27: 1 mg via ORAL
  Filled 2015-03-26: qty 1

## 2015-03-26 MED ORDER — POTASSIUM CHLORIDE CRYS ER 20 MEQ PO TBCR
40.0000 meq | EXTENDED_RELEASE_TABLET | Freq: Once | ORAL | Status: AC
  Administered 2015-03-26: 40 meq via ORAL
  Filled 2015-03-26: qty 2

## 2015-03-26 MED ORDER — ALUM & MAG HYDROXIDE-SIMETH 200-200-20 MG/5ML PO SUSP: 30.0000 mL | ORAL | Status: DC | PRN

## 2015-03-26 NOTE — BH Assessment (Signed)
Assessment Note  [name] is an 29 y.o. female. Patient was given chemical restraints, therefore; unable to participate in a TTS assessment. Per ED notes: "Per [employer]. Pt was screaming religous phrases and trying to lie down in front of traffic. Pt refuses to speak with any staff or [employer]. Pt handcuffed upon arrival due to running away. Hx of substance abuse per notes from previous visits. PA taking out IVC papers".  Writer unable to confirm or deny if patient is SI, HI, and/or experiencing any AVH's.   [name], NP recommends inpatient admission.     Diagnosis: Psychotic Disorder  Past Medical History:  Past Medical History  Diagnosis Date  . STD (female)     hx of chlamydia and gonorrhea  . History of PID   . Endometriosis   . Glaucoma   . Ovarian cyst   . Bartholin cyst   . Headache(784.0)   . Infection     UTI  . Vaginal Pap smear, abnormal     has not followed up  . Substance abuse     Past Surgical History  Procedure Laterality Date  . Fracture surgery      left leg  . Dilation and curettage of uterus    . Laparoscopy      Family History:  Family History  Problem Relation Age of Onset  . Anesthesia problems Neg Hx   . Hypotension Neg Hx   . Malignant hyperthermia Neg Hx   . Pseudochol deficiency Neg Hx   . Alcohol abuse Neg Hx   . Diabetes Mother   . Hypertension Maternal Grandmother   . Heart disease Maternal Grandmother     great grandma    Social History:  reports that she has been smoking Cigarettes.  She has a 1.75 pack-year smoking history. She has never used smokeless tobacco. She reports that she uses illicit drugs (Marijuana, Cocaine, and Heroin). She reports that she does not drink alcohol.  Additional Social History:  Alcohol / Drug Use Pain Medications: SEE MAR Prescriptions: SEE MAR Over the Counter: SEE MAR History of alcohol / drug use?:  (Patient has a history of polysubstance abuse; due to current MH state (chemical restraints) she  is unable to answer any questions. )  CIWA: CIWA-Ar BP: 109/63 mmHg Pulse Rate: 98 COWS:    Allergies:  Allergies  Allergen Reactions  . Acyclovir And Related Swelling  . Darvocet [Propoxyphene N-Acetaminophen] Hives  . Doxycycline Swelling  . Flexeril [Cyclobenzaprine] Nausea And Vomiting  . Metoclopramide Hives  . Naproxen Hives    Pt is able to take ibuprofen without problems  . Latex Rash  . Tramadol Rash    Home Medications:  (Not in a hospital admission)  OB/GYN Status:  No LMP recorded. Patient is not currently having periods (Reason: Other).  General Assessment Data Location of Assessment: [facility] ED TTS Assessment: In system Is this a Tele or Face-to-Face Assessment?: Face-to-Face Is this an Initial Assessment or a Re-assessment for this encounter?: Initial Assessment Marital status: Other (comment) (unk) Maiden name:  (n/a) Is patient pregnant?: No Pregnancy Status: No Can pt return to current living arrangement?: No Admission Status: Voluntary Is patient capable of signing voluntary admission?: No Referral Source: Self/Family/Friend Insurance type:  (Self Pay)     Crisis Care Plan Name of Psychiatrist:  (unk) Name of Therapist:  (unk)  Education Status Is patient currently in school?: No Current Grade:  (n/a) Highest grade of school patient has completed:  (n/a) Name of school:  (  n/a) Contact person: n/a  Risk to self with the past 6 months Suicidal Ideation: No (unable to confirm or deny ) Has patient been a risk to self within the past 6 months prior to admission? :  (unk) Suicidal Intent:  (unk) Has patient had any suicidal intent within the past 6 months prior to admission? :  (unk) Is patient at risk for suicide?:  (unk) Suicidal Plan?:  (unk) Has patient had any suicidal plan within the past 6 months prior to admission? :  (unk) Access to Means:  (unk) What has been your use of drugs/alcohol within the last 12 months?:  (patient has a history  polysubstance) Previous Attempts/Gestures:  (unk) How many times?:  (unk) Other Self Harm Risks:  (unk) Triggers for Past Attempts: Other (Comment) (unk) Intentional Self Injurious Behavior:  (unk) Family Suicide History:  (unk) Recent stressful life event(s):  (unk) Persecutory voices/beliefs?: No Depression:  (unable to confirm or deny) Depression Symptoms:  (unk) Substance abuse history and/or treatment for substance abuse?:  (unable to confirm or deny) Suicide prevention information given to non-admitted patients:  (unable to confrim or deny)  Risk to Others within the past 6 months Homicidal Ideation:  (unable to confirm or deny) Does patient have any lifetime risk of violence toward others beyond the six months prior to admission? : Unknown Thoughts of Harm to Others:  (unk) Current Homicidal Intent:  (unk) Current Homicidal Plan:  (unk) Access to Homicidal Means:  (unk) Identified Victim:  (unk) History of harm to others?:  (unk) Assessment of Violence:  (unk) Violent Behavior Description:  (unk) Does patient have access to weapons?:  (unk') Criminal Charges Pending?:  (unk) Does patient have a court date:  (unk)  Psychosis Hallucinations:  (patient religiously occupied; hyper religous, etc) Delusions:  (patient responding to internal stimul)  Mental Status Report Appearance/Hygiene: Unable to Assess Eye Contact: Unable to Assess Motor Activity: Unable to assess Speech: Unable to assess Level of Consciousness: Unable to assess Mood:  (UTA) Affect:  (UTA) Anxiety Level:  (UTA) Thought Processes: Unable to Assess Judgement: Unable to Assess Orientation: Unable to assess Obsessive Compulsive Thoughts/Behaviors: Unable to Assess  Cognitive Functioning Concentration: Unable to Assess Memory: Unable to Assess IQ: Average Insight: Unable to Assess Impulse Control: Unable to Assess Appetite:  (UTA) Weight Loss:  (UTA) Weight Gain:  (UTA) Sleep: Unable to  Assess Total Hours of Sleep:  (n/a) Vegetative Symptoms: Unable to Assess  ADLScreening [facility] Assessment Services) Patient's cognitive ability adequate to safely complete daily activities?: Yes Patient able to express need for assistance with ADLs?: Yes Independently performs ADLs?: Yes (appropriate for developmental age)  Prior Inpatient Therapy Prior Inpatient Therapy: Yes (unk) Prior Therapy Dates:  (unk; patient was inpatient at [facility] at least 2x's for polysubs) Prior Therapy Facilty/Provider(s):  [facility]) Reason for Treatment:  (unk)  Prior Outpatient Therapy Prior Outpatient Therapy:  (unk) Prior Therapy Dates:  (unk) Prior Therapy Facilty/Provider(s):  (unk) Reason for Treatment:  (unk) Does patient have an ACCT team?: Unknown Does patient have Intensive In-House Services?  : Unknown Does patient have [facility] services? : Unknown Does patient have P4CC services?: Unknown  ADL Screening (condition at time of admission) Patient's cognitive ability adequate to safely complete daily activities?: Yes Is the patient deaf or have difficulty hearing?: No Does the patient have difficulty seeing, even when wearing glasses/contacts?: No Does the patient have difficulty concentrating, remembering, or making decisions?: Yes Patient able to express need for assistance with ADLs?: Yes Does the patient  have difficulty dressing or bathing?: No Independently performs ADLs?: Yes (appropriate for developmental age) Does the patient have difficulty walking or climbing stairs?: No Weakness of Legs: None Weakness of Arms/Hands: None  Home Assistive Devices/Equipment Home Assistive Devices/Equipment: None    Abuse/Neglect Assessment (Assessment to be complete while patient is alone) Physical Abuse: Denies Verbal Abuse: Denies Sexual Abuse: Denies Exploitation of patient/patient's resources: Denies Self-Neglect: Denies Values / Beliefs Cultural Requests During Hospitalization: None Spiritual  Requests During Hospitalization: None        Additional Information 1:1 In Past 12 Months?:  (unk) CIRT Risk:  (unk) Elopement Risk:  (unk) Does patient have medical clearance?:  (unk)     Disposition:  Disposition Initial Assessment Completed for this Encounter: Yes Disposition of Patient: Inpatient treatment program Julieanne Cotton, NP recommends inpatient treatment) Type of inpatient treatment program: Adult  On Site Evaluation by:   Reviewed with Physician:    Melynda Ripple [facility] 03/26/2015 3:56 PM

## 2015-03-26 NOTE — ED Notes (Signed)
Pt wet her clothing and the bed. [employer] unhandcuffed pt to change sheets and change the pt into purple scrubs. Pt became increasingly agitated and began screaming religious phrases at the top of her lungs. Pt refused to be rolled to changed or put on clothing. Multiple staff called in to help. Orders given for medication and restraints.

## 2015-03-26 NOTE — ED Notes (Signed)
Pt resting. Removed restraints. Sitter remains at bedside

## 2015-03-26 NOTE — ED Notes (Signed)
[facility]

## 2015-03-26 NOTE — BH Assessment (Signed)
TTS [place] will assess.
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
## 2015-03-26 NOTE — ED Notes (Signed)
Pt wolfing down 2 sandwiches

## 2015-03-26 NOTE — ED Notes (Addendum)
Per [employer]. Pt was screaming religous phrases and trying to lie down in front of traffic. Pt refuses to speak with any staff or [employer]. Pt handcuffed upon arrival due to running away. Hx of substance abuse per notes from previous visits. PA taking out IVC papers.

## 2015-03-26 NOTE — ED Notes (Signed)
Pt starting to look sleepy. Pt sitting up near the end of the stretcher, watching [employer].

## 2015-03-26 NOTE — ED Notes (Signed)
Bed: WA08 Expected date:  Expected time:  Means of arrival:  Comments: [employer]

## 2015-03-27 ENCOUNTER — Encounter (HOSPITAL_COMMUNITY): Payer: Self-pay

## 2015-03-27 ENCOUNTER — Inpatient Hospital Stay (HOSPITAL_COMMUNITY)
Admission: AD | Admit: 2015-03-27 | Discharge: 2015-03-30 | DRG: 885 | Disposition: A | Discharge status: Home / Self Care | Payer: Medicaid Other | Source: Intra-hospital | Attending: Psychiatry | Admitting: Psychiatry

## 2015-03-27 DIAGNOSIS — F329 Major depressive disorder, single episode, unspecified: Secondary | ICD-10-CM | POA: Diagnosis present

## 2015-03-27 DIAGNOSIS — F1414 Cocaine abuse with cocaine-induced mood disorder: Secondary | ICD-10-CM

## 2015-03-27 DIAGNOSIS — F333 Major depressive disorder, recurrent, severe with psychotic symptoms: Principal | ICD-10-CM | POA: Diagnosis present

## 2015-03-27 DIAGNOSIS — F1910 Other psychoactive substance abuse, uncomplicated: Secondary | ICD-10-CM | POA: Diagnosis not present

## 2015-03-27 DIAGNOSIS — F1494 Cocaine use, unspecified with cocaine-induced mood disorder: Secondary | ICD-10-CM | POA: Diagnosis present

## 2015-03-27 DIAGNOSIS — Z599 Problem related to housing and economic circumstances, unspecified: Secondary | ICD-10-CM

## 2015-03-27 DIAGNOSIS — F4310 Post-traumatic stress disorder, unspecified: Secondary | ICD-10-CM | POA: Diagnosis present

## 2015-03-27 DIAGNOSIS — F23 Brief psychotic disorder: Secondary | ICD-10-CM | POA: Diagnosis not present

## 2015-03-27 MED ORDER — DIPHENHYDRAMINE HCL 50 MG/ML IJ SOLN
INTRAMUSCULAR | Status: AC
  Administered 2015-03-27: 18:00:00
  Filled 2015-03-27: qty 1

## 2015-03-27 MED ORDER — MAGNESIUM HYDROXIDE 400 MG/5ML PO SUSP: 30.0000 mL | Freq: Every day | ORAL | Status: DC | PRN

## 2015-03-27 MED ORDER — TRAZODONE HCL 50 MG PO TABS: 50.0000 mg | ORAL_TABLET | Freq: Every day | ORAL | Status: DC

## 2015-03-27 MED ORDER — ALBUTEROL SULFATE HFA 108 (90 BASE) MCG/ACT IN AERS: 2.0000 | INHALATION_SPRAY | RESPIRATORY_TRACT | Status: DC | PRN

## 2015-03-27 MED ORDER — RISPERIDONE 0.25 MG PO TABS
0.2500 mg | ORAL_TABLET | Freq: Two times a day (BID) | ORAL | Status: DC
  Administered 2015-03-27 – 2015-03-28 (×2): 0.25 mg via ORAL
  Filled 2015-03-27 (×5): qty 1

## 2015-03-27 MED ORDER — DIPHENHYDRAMINE HCL 50 MG/ML IJ SOLN: 50.0000 mg | Freq: Once | INTRAMUSCULAR | Status: DC

## 2015-03-27 MED ORDER — HYDROXYZINE HCL 25 MG PO TABS
25.0000 mg | ORAL_TABLET | Freq: Three times a day (TID) | ORAL | Status: DC | PRN
  Administered 2015-03-27 – 2015-03-30 (×9): 25 mg via ORAL
  Filled 2015-03-27 (×9): qty 1
  Filled 2015-03-27: qty 10

## 2015-03-27 MED ORDER — TRAZODONE HCL 50 MG PO TABS
50.0000 mg | ORAL_TABLET | Freq: Every day | ORAL | Status: DC
  Administered 2015-03-27 – 2015-03-29 (×3): 50 mg via ORAL
  Filled 2015-03-27 (×7): qty 1

## 2015-03-27 MED ORDER — RISPERIDONE 0.5 MG PO TABS
0.2500 mg | ORAL_TABLET | Freq: Two times a day (BID) | ORAL | Status: DC
  Administered 2015-03-27: 0.25 mg via ORAL
  Filled 2015-03-27: qty 1

## 2015-03-27 NOTE — Consult Note (Signed)
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 Normal labor [O80, Z37.9] 12/10/2013  . Opiate dependence, continuous ([place]) [F11.20] 08/07/2013  . Cocaine abuse [F14.10] 08/07/2013  . Nausea/vomiting in pregnancy [O21.9] 07/08/2013  . Pica [F50.89] 07/08/2013  . Polysubstance dependence including opioid type drug, continuous use ([place]) [F11.20, F19.20] 06/17/2013  . PTSD (post-traumatic stress disorder) [F43.10] 06/17/2013  . Major depression ([place]) [F32.9] 06/17/2013  . Unspecified vitamin D deficiency [E55.9] 06/04/2013  . Sickle cell trait ([place]) [D57.3] 06/04/2013  . Trichomonal vaginitis in pregnancy in second trimester [O23.592, A59.01] 06/02/2013  . Polysubstance abuse [F19.10] 05/31/2013  . Tobacco use complicating pregnancy [S01.093] 05/30/2013  . Unspecified high-risk pregnancy [O09.90] 05/30/2013  . Marijuana use [F12.10] 05/27/2013  . Opioid dependence ([place]) [F11.20] 09/09/2011  . Pelvic pain in female [R10.2] 12/07/2010  . DUB (dysfunctional uterine bleeding) [N93.8] 12/07/2010  . History of PID [Z87.42] 12/07/2010    Total Time spent with patient: 45 minutes  Subjective:   [name] is a 29 y.o. female patient admitted with cocaine induced mood disorder.  HPI:  [name], 29 y.o. Female was brought in by [facility] after she was found in the middle of road lying down.  Initially, she was not able to participate in a TTS interview due to being under restrained chemically.  She was  reported being hyper religious.  She states that she was not lying down in the streets to hurt self but she was kneeling praying.  Her UDS positive cocaine.  Per ED notes: "Per [employer]. Pt was screaming religous phrases and trying to lie down in front of traffic.  Pt refuses to speak with any staff or [employer].  Pt handcuffed upon arrival due to running away.  Hx of substance abuse per notes from previous visits. PA taking out IVC papers".  Past Psychiatric History:  None recorded.  Substance abuse  Risk to Self: Suicidal Ideation: No (unable to confirm or deny ) Suicidal Intent:  (unk) Is patient at risk for suicide?:  (unk) Suicidal Plan?:  (unk) Access to Means:  (unk) What has been your use of drugs/alcohol within the last 12 months?:  (patient has a history polysubstance) How many times?:  (unk) Other Self Harm Risks:  (unk) Triggers for Past Attempts: Other (Comment) (unk) Intentional Self Injurious Behavior:  (unk) Risk to Others: Homicidal Ideation:  (unable to confirm or deny) Thoughts of Harm to Others:  (unk) Current Homicidal Intent:  (unk) Current Homicidal Plan:  (unk) Access to Homicidal Means:  (unk) Identified Victim:  (unk) History of harm to others?:  (unk) Assessment of Violence:  (unk) Violent Behavior Description:  (unk) Does patient have access to weapons?:  (unk') Criminal Charges Pending?:  (unk) Does patient have a court date:  (unk) Prior Inpatient Therapy: Prior Inpatient Therapy: Yes (unk) Prior Therapy Dates:  (unk; patient was inpatient at [facility] at least 2x's for polysubs) Prior Therapy Facilty/Provider(s):  [facility]) Reason for Treatment:  (unk) Prior Outpatient Therapy: Prior Outpatient Therapy:  (unk) Prior Therapy Dates:  (unk) Prior Therapy Facilty/Provider(s):  (  unk) Reason for Treatment:  (unk) Does patient have an ACCT team?: Unknown Does patient have Intensive In-House Services?  : Unknown Does patient have [facility] services? : Unknown Does patient have  P4CC services?: Unknown  Past Medical History:  Past Medical History  Diagnosis Date  . STD (female)     hx of chlamydia and gonorrhea  . History of PID   . Endometriosis   . Glaucoma   . Ovarian cyst   . Bartholin cyst   . Headache(784.0)   . Infection     UTI  . Vaginal Pap smear, abnormal     has not followed up  . Substance abuse     Past Surgical History  Procedure Laterality Date  . Fracture surgery      left leg  . Dilation and curettage of uterus    . Laparoscopy     Family History:  Family History  Problem Relation Age of Onset  . Anesthesia problems Neg Hx   . Hypotension Neg Hx   . Malignant hyperthermia Neg Hx   . Pseudochol deficiency Neg Hx   . Alcohol abuse Neg Hx   . Diabetes Mother   . Hypertension Maternal Grandmother   . Heart disease Maternal Grandmother     great grandma   Family Psychiatric History:   Social History:  History  Alcohol Use No    Comment: Denies ETOH use     History  Drug Use  . Yes  . Special: Marijuana, Cocaine, Heroin    Comment: heroin    Social History   Social History  . Marital Status: Single    Spouse Name: N/A  . Number of Children: N/A  . Years of Education: N/A   Social History Main Topics  . Smoking status: Current Every Day Smoker -- 0.25 packs/day for 7 years    Types: Cigarettes  . Smokeless tobacco: Never Used  . Alcohol Use: No     Comment: Denies ETOH use  . Drug Use: Yes    Special: Marijuana, Cocaine, Heroin     Comment: heroin  . Sexual Activity: Yes    Birth Control/ Protection: Condom, None     Comment: Pt currently [redacted] weeks pregnant   Other Topics Concern  . None   Social History Narrative   Additional Social History:    Pain Medications: SEE MAR Prescriptions: SEE MAR Over the Counter: SEE MAR History of alcohol / drug use?:  (Patient has a history of polysubstance abuse; due to current [place] state (chemical restraints) she is unable to answer any questions. )     Allergies:    Allergies  Allergen Reactions  . Acyclovir And Related Swelling  . Darvocet [Propoxyphene N-Acetaminophen] Hives  . Doxycycline Swelling  . Flexeril [Cyclobenzaprine] Nausea And Vomiting  . Metoclopramide Hives  . Naproxen Hives    Pt is able to take ibuprofen without problems  . Latex Rash  . Tramadol Rash    Labs:  Results for orders placed or performed during the hospital encounter of 03/26/15 (from the past 48 hour(s))  Comprehensive metabolic panel     Status: Abnormal   Collection Time: 03/26/15  1:04 PM  Result Value Ref Range   Sodium 138 135 - 145 mmol/L   Potassium 2.9 (L) 3.5 - 5.1 mmol/L   Chloride 105 101 - 111 mmol/L   CO2 22 22 - 32 mmol/L   Glucose, Bld 139 (H) 65 - 99 mg/dL   BUN 14 6 - 20  mg/dL   Creatinine, Ser 1.01 (H) 0.44 - 1.00 mg/dL   Calcium 9.2 8.9 - 10.3 mg/dL   Total Protein 7.5 6.5 - 8.1 g/dL   Albumin 4.4 3.5 - 5.0 g/dL   AST 19 15 - 41 U/L   ALT 20 14 - 54 U/L   Alkaline Phosphatase 50 38 - 126 U/L   Total Bilirubin 0.9 0.3 - 1.2 mg/dL   GFR calc non Af Amer >60 >60 mL/min   GFR calc Af Amer >60 >60 mL/min    Comment: (NOTE) The eGFR has been calculated using the CKD EPI equation. This calculation has not been validated in all clinical situations. eGFR's persistently <60 mL/min signify possible Chronic Kidney Disease.    Anion gap 11 5 - 15  Ethanol (ETOH)     Status: None   Collection Time: 03/26/15  1:04 PM  Result Value Ref Range   Alcohol, Ethyl (B) <5 <5 mg/dL    Comment:        LOWEST DETECTABLE LIMIT FOR SERUM ALCOHOL IS 5 mg/dL FOR MEDICAL PURPOSES ONLY   Salicylate level     Status: None   Collection Time: 03/26/15  1:04 PM  Result Value Ref Range   Salicylate Lvl <4.7 2.8 - 30.0 mg/dL  Acetaminophen level     Status: Abnormal   Collection Time: 03/26/15  1:04 PM  Result Value Ref Range   Acetaminophen (Tylenol), Serum <10 (L) 10 - 30 ug/mL    Comment:        THERAPEUTIC CONCENTRATIONS VARY SIGNIFICANTLY. A RANGE  OF 10-30 ug/mL MAY BE AN EFFECTIVE CONCENTRATION FOR MANY PATIENTS. HOWEVER, SOME ARE BEST TREATED AT CONCENTRATIONS OUTSIDE THIS RANGE. ACETAMINOPHEN CONCENTRATIONS >150 ug/mL AT 4 HOURS AFTER INGESTION AND >50 ug/mL AT 12 HOURS AFTER INGESTION ARE OFTEN ASSOCIATED WITH TOXIC REACTIONS.   CBC     Status: Abnormal   Collection Time: 03/26/15  1:04 PM  Result Value Ref Range   WBC 5.5 4.0 - 10.5 K/uL   RBC 4.16 3.87 - 5.11 MIL/uL   Hemoglobin 12.2 12.0 - 15.0 g/dL   HCT 35.4 (L) 36.0 - 46.0 %   MCV 85.1 78.0 - 100.0 fL   MCH 29.3 26.0 - 34.0 pg   MCHC 34.5 30.0 - 36.0 g/dL   RDW 14.9 11.5 - 15.5 %   Platelets 385 150 - 400 K/uL  I-Stat beta hCG blood, ED ([facility], [facility], [facility] only)     Status: None   Collection Time: 03/26/15  1:15 PM  Result Value Ref Range   I-stat hCG, quantitative <5.0 <5 mIU/mL   Comment 3            Comment:   GEST. AGE      CONC.  (mIU/mL)   <=1 WEEK        5 - 50     2 WEEKS       50 - 500     3 WEEKS       100 - 10,000     4 WEEKS     1,000 - 30,000        FEMALE AND NON-PREGNANT FEMALE:     LESS THAN 5 mIU/mL   Urine rapid drug screen (hosp performed) (Not at [facility])     Status: Abnormal   Collection Time: 03/26/15  8:43 PM  Result Value Ref Range   Opiates NONE DETECTED NONE DETECTED   Cocaine POSITIVE (A) NONE DETECTED   Benzodiazepines POSITIVE (A) NONE DETECTED  Amphetamines NONE DETECTED NONE DETECTED   Tetrahydrocannabinol POSITIVE (A) NONE DETECTED   Barbiturates NONE DETECTED NONE DETECTED    Comment:        DRUG SCREEN FOR MEDICAL PURPOSES ONLY.  IF CONFIRMATION IS NEEDED FOR ANY PURPOSE, NOTIFY LAB WITHIN 5 DAYS.        LOWEST DETECTABLE LIMITS FOR URINE DRUG SCREEN Drug Class       Cutoff (ng/mL) Amphetamine      1000 Barbiturate      200 Benzodiazepine   638 Tricyclics       466 Opiates          300 Cocaine          300 THC              50     Current Facility-Administered Medications  Medication Dose Route Frequency  Provider Last Rate Last Dose  . alum & mag hydroxide-simeth (MAALOX/MYLANTA) 200-200-20 MG/5ML suspension 30 mL  30 mL Oral PRN Margarita Mail, PA-C      . LORazepam (ATIVAN) tablet 1 mg  1 mg Oral Q8H PRN Margarita Mail, PA-C   1 mg at 03/27/15 1202  . nicotine (NICODERM CQ - dosed in mg/24 hours) patch 21 mg  21 mg Transdermal Daily Margarita Mail, PA-C   21 mg at 03/27/15 0953  . ondansetron (ZOFRAN) tablet 4 mg  4 mg Oral Q8H PRN Margarita Mail, PA-C      . risperiDONE (RISPERDAL) tablet 0.25 mg  0.25 mg Oral BID Mojeed Akintayo   0.25 mg at 03/27/15 1201  . traZODone (DESYREL) tablet 50 mg  50 mg Oral QHS Mojeed Akintayo       Current Outpatient Prescriptions  Medication Sig Dispense Refill  . azithromycin (ZITHROMAX) 250 MG tablet Take 4 tablets (1,000 mg total) by mouth daily. Take first 2 tablets together, then 1 every day until finished. (Patient not taking: Reported on 03/26/2015) 4 tablet 0  . HYDROcodone-acetaminophen (NORCO/VICODIN) 5-325 MG per tablet Take 1 tablet by mouth every 6 (six) hours as needed. (Patient not taking: Reported on 03/26/2015) 8 tablet 0  . ibuprofen (ADVIL,MOTRIN) 400 MG tablet Take 1 tablet (400 mg total) by mouth every 6 (six) hours as needed. (Patient not taking: Reported on 03/26/2015) 30 tablet 0  . metroNIDAZOLE (FLAGYL) 500 MG tablet Take 1 tablet (500 mg total) by mouth 2 (two) times daily. (Patient not taking: Reported on 03/26/2015) 28 tablet 0  . traZODone (DESYREL) 100 MG tablet Take 1 tablet (100 mg total) by mouth at bedtime as needed for sleep. (Patient not taking: Reported on 09/03/2014) 30 tablet 0    Musculoskeletal: Strength & Muscle Tone: within normal limits Gait & Station: normal Patient leans: N/A  Psychiatric Specialty Exam: Review of Systems  All other systems reviewed and are negative.   Blood pressure 99/63, pulse 86, temperature 98 F (36.7 C), temperature source Oral, resp. rate 20, SpO2 99 %, unknown if currently  breastfeeding.There is no weight on file to calculate BMI.  General Appearance: Casual  Eye Contact::  Minimal  Speech:  Slow  Volume:  Normal  Mood:  Anxious  Affect:  Full Range  Thought Process:  Loose  Orientation:  Full (Time, Place, and Person)  Thought Content:  Rumination and hyper religious  Suicidal Thoughts:  Denies  Homicidal Thoughts:  Denies  Memory:  Immediate;   Poor Recent;   Poor Remote;   Poor  Judgement:  Poor  Insight:  Lacking  Psychomotor Activity:  Normal  Concentration:  Fair  Recall:  [employer] of Knowledge:Fair  Language: Fair  Akathisia:  Negative  Handed:  Right  AIMS (if indicated):     Assets:  Resilience  ADL's:  Intact  Cognition: WNL  Sleep:  poor   Treatment Plan Summary: Daily contact with patient to assess and evaluate symptoms and progress in treatment and Medication management  Disposition: Recommend psychiatric Inpatient admission when medically cleared. Supportive therapy provided about ongoing stressors.  Inpatient admission for further evaluation and treatment.  Freda Munro May Pharrah Rottman AGNP-BC 03/27/2015 3:01 PM

## 2015-03-27 NOTE — Progress Notes (Signed)
Pt was asleep during the time of group.

## 2015-03-27 NOTE — ED Notes (Signed)
[facility]. Patient cooperative with the police and [facility].

## 2015-03-27 NOTE — Progress Notes (Signed)
D: [name] rates her anxiety 8/10. Reports R arm pain 7/10. She denies SI/HI/AVH. She is asking for her Ativan to be given every 8 hours like she initially had been receiving it. I explained to her at this time that she would be receiving Vistaril for anxiety. She is agreeable at this time and states she will need to speak to her doctor in the morning about her medications.  A: Encouragement and support given.  R: Continue to monitor for patient safety and medication effectiveness.

## 2015-03-27 NOTE — Progress Notes (Signed)
Patient is a 29 year old female who is admitted to the unit for substance abuse and suicidal ideation.  Patient currently denies suicidal ideation.  Patient states "I  was having an internal struggle between obeying the voice of God."  Denies visual and auditory hallucinations.  Patient started complaining about throat discomfort and difficulty swallowing with slurred speech.  NP Renata Capriceonrad made aware.  Benadryl 50 mg IM ordered and administered.  Patient oriented to the unit, staff and room.  Patient speech is clear.  Patient able to eat without difficulty.  Safety protocol initiated.  Patient in her room sleeping.

## 2015-03-27 NOTE — BH Assessment (Signed)
[facility] Assessment Progress Note  Per Thedore MinsMojeed Akintayo, MD, this pt requires psychiatric hospitalization at this time. Kathy Heinrichina Tate, RN, [facility]. Pt is under IVC initiated by the EDP. Pt has signed Consent to Release Information to her boyfriend, and signed form has [facility]. Pt's nurse, Victorino DikeJennifer, has been notified, and agrees to send original paperwork along with pt via law enforcement, and to call report to [phone].  Doylene Canninghomas Hiram Mciver, MA Triage Specialist [phone]

## 2015-03-27 NOTE — ED Notes (Signed)
Patient denies SI. HI and AVH at this time. Patient states " I just have been battling with drug addiction and now it's time for me to do what the Shaune PollackLord is telling me what to do". Writer ask patient if she was having auditory or visual hallucinations patient states "NO" "I just hear the voice of the lord telling me what to do". Meal giving and plan of care discussed with patient. Patient voices no complaints or concerns at this time. Encouragement and support provided and safety maintain. Q 15 min safety checks in place.

## 2015-03-27 NOTE — Tx Team (Signed)
Initial Interdisciplinary Treatment Plan   PATIENT STRESSORS: Medication change or noncompliance Substance abuse   PATIENT STRENGTHS: Ability for insight Average or above average intelligence Capable of independent living Communication skills Supportive family/friends   PROBLEM LIST: Problem List/Patient Goals Date to be addressed Date deferred Reason deferred Estimated date of resolution  " my self esteem" 03/27/15           "coping skills" 03/27/15           Substance abuse 03/27/15           Suicide ideation 03/27/15                  DISCHARGE CRITERIA:  Ability to meet basic life and health needs Adequate post-discharge living arrangements Motivation to continue treatment in a less acute level of care Verbal commitment to aftercare and medication compliance  PRELIMINARY DISCHARGE PLAN: Attend aftercare/continuing care group Attend PHP/IOP Outpatient therapy Return to previous living arrangement  PATIENT/FAMIILY INVOLVEMENT: This treatment plan has been presented to and reviewed with the patient, [name].  The patient and family have been given the opportunity to ask questions and make suggestions.  Mickie Baillizabeth O Iwenekha 03/27/2015, 6:25 PM

## 2015-03-27 NOTE — ED Notes (Signed)
Patient has been sleeping a lot this am. Reports that she did go in the street but she was just praying because she felt like someone was trying to harm her. Some paranoia noted when speaking about incident. Denies it being a suicide attempt. Started on Risperdal and patient took without difficulty. Patient states she feels that she may withdraw due to benzos and methadone. Ativan prn given at this time.

## 2015-03-28 ENCOUNTER — Encounter (HOSPITAL_COMMUNITY): Payer: Self-pay | Admitting: Psychiatry

## 2015-03-28 DIAGNOSIS — F333 Major depressive disorder, recurrent, severe with psychotic symptoms: Secondary | ICD-10-CM | POA: Diagnosis present

## 2015-03-28 MED ORDER — NICOTINE 21 MG/24HR TD PT24
21.0000 mg | MEDICATED_PATCH | Freq: Every day | TRANSDERMAL | Status: DC
  Administered 2015-03-28 – 2015-03-30 (×3): 21 mg via TRANSDERMAL
  Filled 2015-03-28 (×6): qty 1

## 2015-03-28 MED ORDER — POTASSIUM CHLORIDE CRYS ER 20 MEQ PO TBCR
20.0000 meq | EXTENDED_RELEASE_TABLET | Freq: Two times a day (BID) | ORAL | Status: DC
  Administered 2015-03-28 – 2015-03-29 (×4): 20 meq via ORAL
  Filled 2015-03-28 (×8): qty 1

## 2015-03-28 MED ORDER — BENZTROPINE MESYLATE 0.5 MG PO TABS
0.5000 mg | ORAL_TABLET | Freq: Two times a day (BID) | ORAL | Status: DC
  Administered 2015-03-28 – 2015-03-29 (×3): 0.5 mg via ORAL
  Filled 2015-03-28 (×9): qty 1

## 2015-03-28 MED ORDER — CLONIDINE HCL 0.1 MG PO TABS
0.1000 mg | ORAL_TABLET | ORAL | Status: DC
  Filled 2015-03-28 (×4): qty 1

## 2015-03-28 MED ORDER — LORAZEPAM 1 MG PO TABS
1.0000 mg | ORAL_TABLET | Freq: Four times a day (QID) | ORAL | Status: DC | PRN
  Administered 2015-03-28 – 2015-03-30 (×6): 1 mg via ORAL
  Filled 2015-03-28 (×6): qty 1

## 2015-03-28 MED ORDER — RISPERIDONE 0.5 MG PO TABS
0.5000 mg | ORAL_TABLET | Freq: Two times a day (BID) | ORAL | Status: DC
  Administered 2015-03-28 – 2015-03-30 (×3): 0.5 mg via ORAL
  Filled 2015-03-28 (×7): qty 1

## 2015-03-28 MED ORDER — VITAMIN B-1 100 MG PO TABS
100.0000 mg | ORAL_TABLET | Freq: Every day | ORAL | Status: DC
  Administered 2015-03-28 – 2015-03-30 (×3): 100 mg via ORAL
  Filled 2015-03-28 (×7): qty 1

## 2015-03-28 MED ORDER — METHOCARBAMOL 500 MG PO TABS
500.0000 mg | ORAL_TABLET | Freq: Three times a day (TID) | ORAL | Status: DC | PRN
  Administered 2015-03-28 – 2015-03-30 (×3): 500 mg via ORAL
  Filled 2015-03-28 (×3): qty 1

## 2015-03-28 MED ORDER — CLONIDINE HCL 0.1 MG PO TABS
0.1000 mg | ORAL_TABLET | Freq: Two times a day (BID) | ORAL | Status: DC | PRN
  Administered 2015-03-28: 0.1 mg via ORAL
  Filled 2015-03-28: qty 1

## 2015-03-28 MED ORDER — LOPERAMIDE HCL 2 MG PO CAPS: 2.0000 mg | ORAL_CAPSULE | ORAL | Status: DC | PRN

## 2015-03-28 MED ORDER — CLONIDINE HCL 0.1 MG PO TABS: 0.1000 mg | ORAL_TABLET | Freq: Every day | ORAL | Status: DC

## 2015-03-28 MED ORDER — DICYCLOMINE HCL 20 MG PO TABS: 20.0000 mg | ORAL_TABLET | Freq: Four times a day (QID) | ORAL | Status: DC | PRN

## 2015-03-28 MED ORDER — CLONIDINE HCL 0.1 MG PO TABS
0.1000 mg | ORAL_TABLET | Freq: Four times a day (QID) | ORAL | Status: AC
  Administered 2015-03-28 – 2015-03-29 (×7): 0.1 mg via ORAL
  Filled 2015-03-28 (×10): qty 1

## 2015-03-28 MED ORDER — THIAMINE HCL 100 MG/ML IJ SOLN
100.0000 mg | Freq: Every day | INTRAMUSCULAR | Status: DC
  Filled 2015-03-28: qty 2

## 2015-03-28 MED ORDER — FOLIC ACID 1 MG PO TABS
1.0000 mg | ORAL_TABLET | Freq: Every day | ORAL | Status: DC
Start: 2015-03-28 — End: 2015-03-30
  Administered 2015-03-28 – 2015-03-30 (×3): 1 mg via ORAL
  Filled 2015-03-28 (×7): qty 1

## 2015-03-28 MED ORDER — ONDANSETRON 4 MG PO TBDP: 4.0000 mg | ORAL_TABLET | Freq: Four times a day (QID) | ORAL | Status: DC | PRN

## 2015-03-28 MED ORDER — ADULT MULTIVITAMIN W/MINERALS CH
1.0000 | ORAL_TABLET | Freq: Every day | ORAL | Status: DC
  Administered 2015-03-28 – 2015-03-30 (×3): 1 via ORAL
  Filled 2015-03-28 (×7): qty 1

## 2015-03-28 MED ORDER — LORAZEPAM 2 MG/ML IJ SOLN: 1.0000 mg | Freq: Four times a day (QID) | INTRAMUSCULAR | Status: DC | PRN
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
## 2015-03-28 NOTE — Progress Notes (Signed)
D.  Pt pleasant on approach, reports anxiety.  Pt reports continued cravings contributing to her anxiety.  Pt denies SI/HI/hallucinations at this time.  Pt up and visible in dayroom playing cards with peers.  A.  Support and encouragement offered, medication given as ordered for anxiety.  R.  Pt remains safe on unit, will continue to monitor.

## 2015-03-28 NOTE — Progress Notes (Signed)
DAR NOTE: Patient presents with anxious affect and depressed mood.  Denies pain, auditory and visual hallucinations.  Reports withdrawal symptoms of cravings, agitation, cramping and irritability.  Rates depression at 2/10, hopelessness at 0/10, and anxiety at 7/10.  Maintained on routine safety checks.  Medications given as prescribed.  Support and encouragement offered as needed. States goal for today is "coping."  Patient remained in her room sleeping.  Patient complain of anxiety and irritability.  Clonidine and vistaril given with good effect.

## 2015-03-28 NOTE — [facility] Suicide Risk Assessment (Signed)
[facility] Admission Suicide Risk Assessment   Nursing information obtained from:  Patient Demographic factors:  Low socioeconomic status, unemployed Current Mental Status:  NA remains paranoid Loss Factors:  Financial problems / change in socioeconomic status Historical Factors:  NA denies any hx of previous SA, reports last hx of SIB last time was at the age of 56 Risk Reduction Factors:  Positive therapeutic relationship, has 2 small kids that she lives with. Sense of responsibility to family Total Time spent with patient: 45 minutes Principal Problem: Cocaine-induced mood disorder (HCC) Diagnosis:   Patient Active Problem List   Diagnosis Date Noted  . 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 Normal labor [O80, Z37.9] 12/10/2013  . Opiate dependence, continuous (H[facility]) [F11.20] 08/07/2013  . Cocaine abuse [F14.10] 08/07/2013  . Nausea/vomiting in pregnancy [O21.9] 07/08/2013  . Pica [F50.89] 07/08/2013  . Polysubstance dependence including opioid type drug, continuous use (HCC) [F11.20, F19.20] 06/17/2013  . PTSD (post-traumatic stress disorder) [F43.10] 06/17/2013  . Major depression (HCC) [F32.9] 06/17/2013  . Unspecified vitamin D deficiency [E55.9] 06/04/2013  . Sickle cell trait (HCC) [D57.3] 06/04/2013  . Trichomonal vaginitis in pregnancy in second trimester [O23.592, A59.01] 06/02/2013  . Polysubstance abuse [F19.10] 05/31/2013  . Tobacco use complicating pregnancy [O99.330] 05/30/2013  . Unspecified high-risk pregnancy [O09.90] 05/30/2013  . Marijuana use [F12.10] 05/27/2013  . Opioid dependence (HCC) [F11.20] 09/09/2011  . Pelvic pain in female [R10.2] 12/07/2010  . DUB (dysfunctional uterine bleeding) [N93.8] 12/07/2010  . History of PID [Z87.42] 12/07/2010     Continued Clinical Symptoms:  Alcohol Use Disorder  Identification Test Final Score (AUDIT): 0 The "Alcohol Use Disorders Identification Test", Guidelines for Use in Primary Care, Second Edition.  World [employer] writer [facility]). Score between 0-7:  no or low risk or alcohol related problems. Score between 8-15:  moderate risk of alcohol related problems. Score between 16-19:  high risk of alcohol related problems. Score 20 or above:  warrants further diagnostic evaluation for alcohol dependence and treatment.   CLINICAL FACTORS:   Severe Anxiety and/or Agitation Depression:   Comorbid alcohol abuse/dependence Delusional Impulsivity Alcohol/Substance Abuse/Dependencies More than one psychiatric diagnosis Currently Psychotic   Pt states she was feeling paranoid that someone was trying to her so she was praying in the middle of the street. Pt states it was not a suicide attempt. Today denies SI/HI. Denies AVH. Today states she is a little paranoid but as bad as before. Today she is depressed. Asking for  Ativan for anxiety. States she didn't sleep well and Vistaril is not helping.   Pt uses Oxycodone and Heroin on a daily basis. Pt reports using cocaine yesterday prior to her admission. She uses it on a sporadic basis. Denies alcohol use. Pt smoking 1 ppd.     Musculoskeletal: Strength & Muscle Tone: within normal limits Gait & Station: normal Patient leans: N/A  Psychiatric Specialty Exam: Physical Exam  Review of Systems  Constitutional: Negative for fever, chills and malaise/fatigue.  HENT: Negative for sore throat and tinnitus.   Respiratory: Negative for cough and wheezing.   Cardiovascular: Negative for chest pain and leg swelling.  Gastrointestinal: Negative for heartburn, nausea, vomiting and abdominal pain.  Musculoskeletal: Positive for myalgias, back pain, joint pain and neck pain.  Skin: Negative for itching and rash.  Neurological: Negative for dizziness, tremors, seizures, loss of consciousness and headaches.   Psychiatric/Behavioral: Positive  for depression and substance abuse. Negative for suicidal ideas and hallucinations. The patient is nervous/anxious and has insomnia.     Blood pressure 106/76, pulse 85, temperature 98.1 F (36.7 C), temperature source Oral, resp. rate 14, height 5\' 4"  (1.626 m), weight 58.968 kg (130 lb), SpO2 100 %, unknown if currently breastfeeding.Body mass index is 22.3 kg/(m^2).  General Appearance: Bizarre  Eye Contact::  Fair  Speech:  Clear and Coherent and Normal Rate  Volume:  Normal  Mood:  Euphoric  Affect:  Labile  Thought Process:  Tangential  Orientation:  Full (Time, Place, and Person)  Thought Content:  Negative  Suicidal Thoughts:  No  Homicidal Thoughts:  No  Memory:  Immediate;   Fair Recent;   Fair Remote;   Fair  Judgement:  Impaired  Insight:  Lacking  Psychomotor Activity:  Normal  Concentration:  Poor  Recall:  FiservFair  Fund of Knowledge:Fair  Language: Fair  Akathisia:  No  Handed:  Right  AIMS (if indicated):     Assets:  Desire for Improvement  Sleep:  Number of Hours: 6.75  Cognition: WNL  ADL's:  Intact     COGNITIVE FEATURES THAT CONTRIBUTE TO RISK:  Closed-mindedness and Thought constriction (tunnel vision)    SUICIDE RISK:   Severe:  Frequent, intense, and enduring suicidal ideation, specific plan, no subjective intent, but some objective markers of intent (i.e., choice of lethal method), the method is accessible, some limited preparatory behavior, evidence of impaired self-control, severe dysphoria/symptomatology, multiple risk factors present, and few if any protective factors, particularly a lack of social support.  PLAN OF CARE:   Reviewed labs- K 2.9, glu 139, UDS + barb, THC and Cocaine Order K-Dur 20mg  BID  EKG Recheck K on Sunday Increase Risperdal to 0.5mg  po BID Continue Vistaril 25mg  po TID prn anxiety Start trial of Cogentin 0.5mg  po BID for EPS D/c Trazodone per pt request Declined pt's request for  Benzos Nicotine patch for smoking cessation Opiate withdrawal- start Clonidine protocol Alcohol withdrawal protocol for possible withdrawal  Medical Decision Making:  Review of Psycho-Social Stressors (1), Review or order clinical lab tests (1), Established Problem, Worsening (2), Review of Medication Regimen & Side Effects (2) and Review of New Medication or Change in Dosage (2)  I certify that inpatient services furnished can reasonably be expected to improve the patient's condition.   Charlisha Market 03/28/2015, 8:21 AM

## 2015-03-28 NOTE — B[facility] Group Notes (Signed)
[facility] Group Notes:  (Nursing/MHT/Case Management/Adjunct)  Date:  03/28/2015  Time:  10:25 AM  Type of Therapy:  Nurse Education  Participation Level:  Did Not Attend  Participation Quality:  N/A  Affect:  N/A  Cognitive:  N/A  Insight:  None  Engagement in Group:  N/A  Modes of Intervention:  N/A  Summary of Progress/Problems: Invited to attend, but chose not to.  Wynona LunaBeck, Prinston Kynard K 03/28/2015, 10:25 AM

## 2015-03-28 NOTE — B[facility] Group Notes (Signed)
[facility] Group Notes:  (Clinical Social Work)   02/21/2015     11:00-11:15AM  Summary of Progress/Problems:   In today's process group the whiteboard and Motivational Interviewing were used to help patients make a list of human needs, then make a list of healthy and unhealthy coping techniques used to meet those needs.  The group determined with CSW guidance that unhealthy coping techniques work initially, but eventually become harmful.   The patient expressed herself well in group for awhile, but after about 25 minutes left group and did not return.  Type of Therapy:  Group Therapy - Process   Participation Level:  Active  Participation Quality:  Attentive  Affect:  Blunted  Cognitive:  Appropriate  Insight:  Developing/Improving  Engagement in Therapy:  Engaged  Modes of Intervention:  Education, Motivational Interviewing  Ambrose MantleMareida Grossman-Orr, LCSW 03/28/2015, 12:31 PM

## 2015-03-29 LAB — COMPREHENSIVE METABOLIC PANEL
ALBUMIN: 3.8 g/dL (ref 3.5–5.0)
ALK PHOS: 47 U/L (ref 38–126)
ALT: 21 U/L (ref 14–54)
AST: 26 U/L (ref 15–41)
Anion gap: 7 (ref 5–15)
BUN: 14 mg/dL (ref 6–20)
CALCIUM: 9.1 mg/dL (ref 8.9–10.3)
CHLORIDE: 106 mmol/L (ref 101–111)
CO2: 24 mmol/L (ref 22–32)
CREATININE: 0.99 mg/dL (ref 0.44–1.00)
GFR calc non Af Amer: >60 mL/min (ref >60)
Glucose, Bld: 102 mg/dL — ABNORMAL HIGH (ref 65–99)
Potassium: 4.3 mmol/L (ref 3.5–5.1)
SODIUM: 137 mmol/L (ref 135–145)
Total Bilirubin: 0.5 mg/dL (ref 0.3–1.2)
Total Protein: 6.7 g/dL (ref 6.5–8.1)

## 2015-03-29 MED ORDER — ASPIRIN-ACETAMINOPHEN-CAFFEINE 250-250-65 MG PO TABS
2.0000 | ORAL_TABLET | Freq: Four times a day (QID) | ORAL | Status: DC | PRN
  Administered 2015-03-29 – 2015-03-30 (×3): 2 via ORAL
  Filled 2015-03-29 (×2): qty 2

## 2015-03-29 MED ORDER — DIPHENHYDRAMINE HCL 50 MG/ML IJ SOLN
50.0000 mg | Freq: Once | INTRAMUSCULAR | Status: AC
  Administered 2015-03-29: 50 mg via INTRAMUSCULAR
  Filled 2015-03-29: qty 1

## 2015-03-29 MED ORDER — BENZTROPINE MESYLATE 1 MG PO TABS
1.0000 mg | ORAL_TABLET | Freq: Two times a day (BID) | ORAL | Status: DC
  Administered 2015-03-29 – 2015-03-30 (×2): 1 mg via ORAL
  Filled 2015-03-29 (×2): qty 1
  Filled 2015-03-29: qty 14
  Filled 2015-03-29 (×2): qty 1
  Filled 2015-03-29: qty 14
  Filled 2015-03-29: qty 1

## 2015-03-29 MED ORDER — DIPHENHYDRAMINE HCL 50 MG/ML IJ SOLN
INTRAMUSCULAR | Status: AC
  Administered 2015-03-29: 50 mg via INTRAMUSCULAR
  Filled 2015-03-29: qty 1

## 2015-03-29 MED ORDER — ASPIRIN-ACETAMINOPHEN-CAFFEINE 250-250-65 MG PO TABS
ORAL_TABLET | ORAL | Status: AC
  Filled 2015-03-29: qty 2

## 2015-03-29 MED ORDER — POTASSIUM CHLORIDE CRYS ER 20 MEQ PO TBCR
20.0000 meq | EXTENDED_RELEASE_TABLET | Freq: Two times a day (BID) | ORAL | Status: DC
  Administered 2015-03-30: 20 meq via ORAL
  Filled 2015-03-29 (×3): qty 1

## 2015-03-29 NOTE — B[facility] Group Notes (Signed)
[facility] Group Notes:  (Clinical Social Work)  03/29/2015  10:00-11:00AM  Summary of Progress/Problems:   The main focus of today's process group was to   1)  discuss the importance of adding supports  2)  define health supports versus unhealthy supports  3)  identify the patient's current unhealthy supports and plan how to handle them  4)  Identify the patient's current healthy supports and plan what to add.  An emphasis was placed on using counselor, doctor, therapy groups, 12-step groups, and problem-specific support groups to expand supports.    The patient expressed full comprehension of the concepts presented, and agreed that there is a need to add more supports.  The patient stated during group that even though there are hypocrites at AA/NA meetings, someone like herself has to focus on what she can get out of the groups anyway.  She left before the end of group.  Type of Therapy:  Process Group with Motivational Interviewing  Participation Level:  Minimal  Participation Quality:  Sharing  Affect:  Blunted  Cognitive:  Oriented  Insight:  Developing/Improving  Engagement in Therapy:  Limited  Modes of Intervention:   Education, Support and Processing, Activity  Ambrose MantleMareida Grossman-Orr, LCSW 03/29/2015

## 2015-03-29 NOTE — Progress Notes (Signed)
Pt reports relief and that her tongue feels better. She denies any other symptoms. She is up at the nurses station talking and interacting with staff. She is also making a birthday sign for her 29 year old daughter. Pt remains safe on the unit. Will continue to monitor.

## 2015-03-29 NOTE — B[facility] Group Notes (Signed)
[facility] Group Notes:  (Nursing/MHT/Case Management/Adjunct)  Date:  03/29/2015  Time:  2:41 PM  Type of Therapy:  Group Therapy  Participation Level:  Did Not Attend  Participation Quality:   Affect:  Cognitive:    Insight:   Engagement in Group:   Modes of Intervention:   Summary of Progress/Problems:  Leda QuailSmith, Oddis Westling T 03/29/2015, 2:41 PM

## 2015-03-29 NOTE — Progress Notes (Signed)
Pt came to this RN complaining of her tongue feeling "scratchy" and like it is starting to swell. She reports that she had the same feeling a couple days ago and had to receive Benadryl IM. She thinks it may be due to risperdal. Pt also states that she took risperdal as a child and had no problems. Provider Renata Capriceonrad, NP notified. Verbal order received for 50mg  Benadryl IM. Administered to patient via right deltoid. Pt has a nicotine patch on her left deltoid. Pt tolerated well. Pt remains safe on the unit. Will continue to monitor.

## 2015-03-29 NOTE — Progress Notes (Signed)
Psychoeducational Group Note  Date:  03/29/2015 Time: 0338  Group Topic/Focus:  Wrap-Up Group:   The focus of this group is to help patients review their daily goal of treatment and discuss progress on daily workbooks.  Participation Level: Did Not Attend  Participation Quality:  Not Applicable  Affect:  Not Applicable  Cognitive:  Not Applicable  Insight:  Not Applicable  Engagement in Group: Not Applicable  Additional Comments:  The patient did not attend last evening's A.A.meeting.   Halli Equihua S 03/29/2015, 3:38 AM

## 2015-03-29 NOTE — Progress Notes (Signed)
[facility] MD Progress Note  03/29/2015  [name]  MRN:  [id number] Subjective:  "I'm doing pretty good. My tongue doesn't feel as funny as it did yesterday. I'm participating in groups and my anxiety is a lot better also.  Objective: Pt seen and chart reviewed. Pt is alert/oriented x4, calm, cooperative, and appropriate to situation. Pt denies suicidal/homicidal ideation and psychosis and does not appear to be responding to internal stimuli. Pt reports that she is feeling better in terms of her possible EPS symptoms. However, nursing report that she has stated throughout the day that her "tongue feels fat". Pt's tongue does not appear to be swollen nor does she present with speech difficulties as she did the day prior. However, we will increase the Cogentin slightly as a precaution. Pt reports good sleep and good appetite and has been participating well in therapy sessions with others.   Principal Problem: MDD (major depressive disorder), recurrent, severe, with psychosis (HCC) Diagnosis:   Patient Active Problem List   Diagnosis Date Noted  . MDD (major depressive disorder), recurrent, severe, with psychosis (HCC) [F33.3] 03/28/2015  . 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 Normal labor [O80, Z37.9] 12/10/2013  . Opiate dependence, continuous (HCC) [F11.20] 08/07/2013  . Cocaine abuse [F14.10] 08/07/2013  . Nausea/vomiting in pregnancy [O21.9] 07/08/2013  . Pica [F50.89] 07/08/2013  . Polysubstance dependence including opioid type drug, continuous use (HCC) [F11.20, F19.20] 06/17/2013  . PTSD (post-traumatic stress disorder) [F43.10] 06/17/2013  . Unspecified vitamin D deficiency [E55.9] 06/04/2013  . Sickle cell trait (HCC) [D57.3] 06/04/2013  . Trichomonal vaginitis in pregnancy in second trimester [O23.592, A59.01] 06/02/2013  .  Polysubstance abuse [F19.10] 05/31/2013  . Tobacco use complicating pregnancy [O99.330] 05/30/2013  . Unspecified high-risk pregnancy [O09.90] 05/30/2013  . Marijuana use [F12.10] 05/27/2013  . Opioid dependence (HCC) [F11.20] 09/09/2011  . Pelvic pain in female [R10.2] 12/07/2010  . DUB (dysfunctional uterine bleeding) [N93.8] 12/07/2010  . History of PID [Z87.42] 12/07/2010   Total Time spent with patient: 15 minutes   Past Medical History:  Past Medical History  Diagnosis Date  . STD (female)     hx of chlamydia and gonorrhea  . History of PID   . Endometriosis   . Glaucoma   . Ovarian cyst   . Bartholin cyst   . Headache(784.0)   . Infection     UTI  . Vaginal Pap smear, abnormal     has not followed up  . Substance abuse     Past Surgical History  Procedure Laterality Date  . Fracture surgery      left leg  . Dilation and curettage of uterus    . Laparoscopy     Family History:  Family History  Problem Relation Age of Onset  . Anesthesia problems Neg Hx   . Hypotension Neg Hx   . Malignant hyperthermia Neg Hx   . Pseudochol deficiency Neg Hx   . Alcohol abuse Neg Hx   . Diabetes Mother   . Hypertension Maternal Grandmother   . Heart disease Maternal Grandmother     great grandma   Social History:  History  Alcohol Use No    Comment: Denies ETOH use     History  Drug Use  . Yes  . Special: Marijuana, Cocaine, Heroin    Comment: heroin    Social History   Social History  .  Marital Status: Single    Spouse Name: N/A  . Number of Children: N/A  . Years of Education: N/A   Social History Main Topics  . Smoking status: Current Every Day Smoker -- 0.25 packs/day for 7 years  . Smokeless tobacco: Never Used  . Alcohol Use: No     Comment: Denies ETOH use  . Drug Use: Yes    Special: Marijuana, Cocaine, Heroin     Comment: heroin  . Sexual Activity: Not Asked     Comment: Pt currently [redacted] weeks pregnant   Other Topics Concern  . None    Social History Narrative   Additional Social History:                         Sleep: Good  Appetite:  Good  Current Medications: Current Facility-Administered Medications  Medication Dose Route Frequency Provider Last Rate Last Dose  . aspirin-acetaminophen-caffeine (EXCEDRIN MIGRAINE) per tablet 2 tablet  2 tablet Oral Q6H PRN Court Joyharles E Kober, PA-C   2 tablet at 03/29/15 0428  . benztropine (COGENTIN) tablet 1 mg  1 mg Oral BID Beau FannyJohn C Venezia Sargeant, FNP      . cloNIDine (CATAPRES) tablet 0.1 mg  0.1 mg Oral QID Oletta DarterSalina Agarwal, MD   0.1 mg at 03/29/15 1303   Followed by  . [START ON 03/30/2015] cloNIDine (CATAPRES) tablet 0.1 mg  0.1 mg Oral BH-qamhs Oletta DarterSalina Agarwal, MD       Followed by  . [START ON 04/02/2015] cloNIDine (CATAPRES) tablet 0.1 mg  0.1 mg Oral QAC breakfast Oletta DarterSalina Agarwal, MD      . dicyclomine (BENTYL) tablet 20 mg  20 mg Oral Q6H PRN Oletta DarterSalina Agarwal, MD      . folic acid (FOLVITE) tablet 1 mg  1 mg Oral Daily Oletta DarterSalina Agarwal, MD   1 mg at 03/29/15 1016  . hydrOXYzine (ATARAX/VISTARIL) tablet 25 mg  25 mg Oral TID PRN Worthy FlankIjeoma E Nwaeze, NP   25 mg at 03/29/15 0415  . loperamide (IMODIUM) capsule 2-4 mg  2-4 mg Oral PRN Oletta DarterSalina Agarwal, MD      . LORazepam (ATIVAN) tablet 1 mg  1 mg Oral Q6H PRN Oletta DarterSalina Agarwal, MD   1 mg at 03/29/15 1624   Or  . LORazepam (ATIVAN) injection 1 mg  1 mg Intravenous Q6H PRN Oletta DarterSalina Agarwal, MD      . magnesium hydroxide (MILK OF MAGNESIA) suspension 30 mL  30 mL Oral Daily PRN Charm RingsJamison Y Lord, NP      . methocarbamol (ROBAXIN) tablet 500 mg  500 mg Oral Q8H PRN Oletta DarterSalina Agarwal, MD   500 mg at 03/28/15 2109  . multivitamin with minerals tablet 1 tablet  1 tablet Oral Daily Oletta DarterSalina Agarwal, MD   1 tablet at 03/29/15 1015  . nicotine (NICODERM CQ - dosed in mg/24 hours) patch 21 mg  21 mg Transdermal Daily Rachael FeeIrving A Lugo, MD   21 mg at 03/29/15 0803  . ondansetron (ZOFRAN-ODT) disintegrating tablet 4 mg  4 mg Oral Q6H PRN Oletta DarterSalina Agarwal, MD      .  potassium chloride SA (K-DUR,KLOR-CON) CR tablet 20 mEq  20 mEq Oral BID Oletta DarterSalina Agarwal, MD   20 mEq at 03/29/15 1015  . risperiDONE (RISPERDAL) tablet 0.5 mg  0.5 mg Oral BID Oletta DarterSalina Agarwal, MD   0.5 mg at 03/29/15 0804  . thiamine (VITAMIN B-1) tablet 100 mg  100 mg Oral Daily Oletta DarterSalina Agarwal, MD   100 mg  at 03/29/15 1016   Or  . thiamine (B-1) injection 100 mg  100 mg Intravenous Daily Oletta Darter, MD      . traZODone (DESYREL) tablet 50 mg  50 mg Oral QHS Charm Rings, NP   50 mg at 03/28/15 2209    Lab Results: No results found for this or any previous visit (from the past 48 hour(s)).  Physical Findings: AIMS: Facial and Oral Movements Muscles of Facial Expression: None, normal Lips and Perioral Area: None, normal Jaw: None, normal Tongue: None, normal,Extremity Movements Upper (arms, wrists, hands, fingers): None, normal Lower (legs, knees, ankles, toes): None, normal, Trunk Movements Neck, shoulders, hips: None, normal, Overall Severity Severity of abnormal movements (highest score from questions above): None, normal Incapacitation due to abnormal movements: None, normal Patient's awareness of abnormal movements (rate only patient's report): No Awareness, Dental Status Current problems with teeth and/or dentures?: No Does patient usually wear dentures?: No  CIWA:  CIWA-Ar Total: 3 COWS:  COWS Total Score: 3  Musculoskeletal: Strength & Muscle Tone: within normal limits Gait & Station: normal Patient leans: N/A  Psychiatric Specialty Exam: Review of Systems  Psychiatric/Behavioral: Positive for depression and substance abuse. Negative for suicidal ideas and hallucinations. The patient is nervous/anxious and has insomnia.   All other systems reviewed and are negative.   Blood pressure 148/62, pulse 88, temperature 99.1 F (37.3 C), temperature source Oral, resp. rate 16, height  (1.626 m), weight 58.968 kg (130 lb), SpO2 100 %, unknown if currently  breastfeeding.Body mass index is 22.3 kg/(m^2).  General Appearance: Casual and Fairly Groomed  Patent attorney::  Good  Speech:  Clear and Coherent and Normal Rate  Volume:  Normal  Mood:  Anxious and Depressed  Affect:  Appropriate, Congruent and Depressed  Thought Process:  Coherent and Goal Directed  Orientation:  Full (Time, Place, and Person)  Thought Content:  WDL  Suicidal Thoughts:  No  Homicidal Thoughts:  No  Memory:  Immediate;   Fair Recent;   Fair Remote;   Fair  Judgement:  Fair  Insight:  Fair  Psychomotor Activity:  Normal  Concentration:  Fair  Recall:  [employer] of Knowledge:Fair  Language: Fair  Akathisia:  No  Handed:    AIMS (if indicated):     Assets:  Communication Skills Desire for Improvement Physical Health Resilience Social Support  ADL's:  Intact  Cognition: WNL  Sleep:  Number of Hours: 6.75   Treatment Plan Summary: Daily contact with patient to assess and evaluate symptoms and progress in treatment and Medication management  Medications: Reviewed labs- K 2.9, glu 139, UDS + barb, THC and Cocaine Order K-Dur  BID for 6 doses to reach 3.5 K level.  EKG failed to store in computer. Redo today Recheck K on Monday evening after all 6 doses for standard formula of po = 0.1 increase in K+ hematologic serum level ( for IV route) -Continue Risperdal to 0.5mg  po BID -Continue Vistaril  po TID prn anxiety -Increase Cogentin to  bid for mild EPS-like symptoms which are persisting yet improved  Declined pt's request for Benzos -Nicotine patch for smoking cessation -Opiate withdrawal- continue Clonidine protocol -Alcohol withdrawal protocol for possible withdrawal  Beau Fanny, FNP-BC 03/29/2015, 1:30PM

## 2015-03-29 NOTE — Progress Notes (Signed)
Psychoeducational Group Note  Date:  03/29/2015 Time:  2233  Group Topic/Focus:  Wrap-Up Group:   The focus of this group is to help patients review their daily goal of treatment and discuss progress on daily workbooks.  Participation Level: Did Not Attend  Participation Quality:  Not Applicable  Affect:  Not Applicable  Cognitive:  Not Applicable  Insight:  Not Applicable  Engagement in Group: Not Applicable  Additional Comments: The patient did not attend the A.A. Meeting this evening.   Hazle CocaGOODMAN, Lular Letson S 03/29/2015, 10:32 PM

## 2015-03-29 NOTE — Progress Notes (Signed)
D.  Pt pleasant but anxious on approach, denies other complaints at this time.  Persistent headache reported at night.  Denies SI/HI/hallucinations at this time.  Interacting appropriately with peers on the unit.  A.  Support and encouragement offered  R.  Pt remains safe on the unit, will continue to monitor.
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
## 2015-03-29 NOTE — Progress Notes (Signed)
D: Pt's mood is irritable. Affect is flat. She rates her depression 0/10, hopelessness 0/10, anxiety 7/10. She is out in the milieu. Pt is cooperative. Her goal for today is to work on Pharmacologistcoping skills. Denies SI/HI/AVH. A: Support given. Verbalization encouraged. Pt encouraged to come to staff for any concerns. Medications given as prescribed. R: Pt is receptive. No complaints of pain or discomfort at this time. Q15 min safety checks maintained. Pt remains safe on the unit. Will continue to monitor.

## 2015-03-30 ENCOUNTER — Encounter (HOSPITAL_COMMUNITY): Payer: Self-pay | Admitting: Psychiatry

## 2015-03-30 DIAGNOSIS — F1910 Other psychoactive substance abuse, uncomplicated: Secondary | ICD-10-CM

## 2015-03-30 DIAGNOSIS — F1494 Cocaine use, unspecified with cocaine-induced mood disorder: Secondary | ICD-10-CM

## 2015-03-30 MED ORDER — LORAZEPAM 1 MG PO TABS: 1.0000 mg | ORAL_TABLET | Freq: Three times a day (TID) | ORAL | Status: DC | PRN

## 2015-03-30 MED ORDER — LAMOTRIGINE 25 MG PO TABS: 25.0000 mg | ORAL_TABLET | Freq: Every day | ORAL | Status: DC

## 2015-03-30 MED ORDER — TRAZODONE HCL 50 MG PO TABS
50.0000 mg | ORAL_TABLET | Freq: Every evening | ORAL | Status: DC | PRN
Start: 2015-03-30 — End: 2015-03-30
  Administered 2015-03-30: 50 mg via ORAL

## 2015-03-30 MED ORDER — LORAZEPAM 1 MG PO TABS
1.0000 mg | ORAL_TABLET | Freq: Once | ORAL | Status: AC
Start: 2015-03-30 — End: 2015-03-30
  Administered 2015-03-30: 1 mg via ORAL
  Filled 2015-03-30: qty 1

## 2015-03-30 MED ORDER — BENZTROPINE MESYLATE 1 MG PO TABS: 1.0000 mg | ORAL_TABLET | Freq: Two times a day (BID) | ORAL | Status: DC

## 2015-03-30 MED ORDER — LAMOTRIGINE 25 MG PO TABS
25.0000 mg | ORAL_TABLET | Freq: Every day | ORAL | Status: DC
  Administered 2015-03-30: 25 mg via ORAL
  Filled 2015-03-30 (×2): qty 1
  Filled 2015-03-30: qty 7
  Filled 2015-03-30: qty 1

## 2015-03-30 MED ORDER — MIRTAZAPINE 7.5 MG PO TABS
7.5000 mg | ORAL_TABLET | Freq: Every day | ORAL | Status: DC
  Filled 2015-03-30: qty 7
  Filled 2015-03-30: qty 1

## 2015-03-30 MED ORDER — NICOTINE 21 MG/24HR TD PT24: 21.0000 mg | MEDICATED_PATCH | Freq: Every day | TRANSDERMAL | Status: DC

## 2015-03-30 MED ORDER — MIRTAZAPINE 7.5 MG PO TABS: 7.5000 mg | ORAL_TABLET | Freq: Every day | ORAL | Status: DC

## 2015-03-30 MED ORDER — HYDROXYZINE HCL 25 MG PO TABS: 25.0000 mg | ORAL_TABLET | Freq: Three times a day (TID) | ORAL | Status: DC | PRN
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
## 2015-03-30 NOTE — Progress Notes (Signed)
D: Patient is anxious and irritable.  She states, "I have a migraine and can't sleep.  I want my ativan."  Patient became irritated and tearful when I explained she received her ativan at 0400 and it was not due until 1000.  She became demanding, "give me a vistaril.  I need a hot pack.  I can't believe this!"  She denies any SI/HI/AVH and she wants to be discharged today.  She rates her depression as a 0; hopelessness as a 0; anxiety as a 7.  Her goal today is "coping."  She c/o severe migraine which she was given excedrin for relief. A: Continue to monitor medication management and MD orders.  Safety checks completed every 15 minutes per protocol.  Offer support and encouragement as needed. R: Patient is redirectable.

## 2015-03-30 NOTE — Progress Notes (Signed)
Discharge note:  Patient discharged home per MD order.  Patient received all personal belongings, prescriptions and one week's of samples.  She denies SI/HI/AVH.  Reviewed discharge instructions and medication administration with patient and she indicated understanding.  She will follow [facility] with [facility].  [facility]

## 2015-03-30 NOTE — [facility] Group Notes (Signed)
[facility] Note   03/30/2015 11:24 AM  Participation Quality:  Invited. Pt crying in room because she could not get ativan at that time. DID NOT ATTEND.  Smart, Cala Kruckenberg LCSW

## 2015-03-30 NOTE — Discharge Summary (Signed)
Physician Discharge Summary Note  Patient:  [name] is an 29 y.o., female  MRN:  [id number]  DOB:  March 26, 1986  Patient phone:  [phone] (home)   Patient address:   [place] [address],   Total Time spent with patient: Greater than 30 minutes  Date of Admission:  03/27/2015  Date of Discharge: 03-31-15  Reason for Admission: Worsening symptoms of depression/drug intoxication.  Discharge Diagnoses: Principal Problem:   MDD (major depressive disorder), recurrent, severe, with psychosis ([place]) Active Problems:   Polysubstance abuse   PTSD (post-traumatic stress disorder)   Cocaine-induced mood disorder [facility])   Psychiatric Specialty Exam: Physical Exam  Constitutional: She appears well-developed.  HENT:  Head: Normocephalic.  Eyes: Pupils are equal, round, and reactive to light.  Neck: Normal range of motion.  Cardiovascular: Normal rate.   Respiratory: Effort normal.  GI: Soft.  Genitourinary:  Denies any issues in this area  Musculoskeletal: Normal range of motion.  Neurological: She is alert.  Skin: Skin is warm.    Review of Systems  Constitutional: Negative.   HENT: Negative.   Eyes: Negative.   Respiratory: Negative.   Cardiovascular: Negative.   Gastrointestinal: Negative.   Genitourinary: Negative.   Musculoskeletal: Negative.   Skin: Negative.   Neurological: Negative.   Endo/Heme/Allergies: Negative.   Psychiatric/Behavioral: Positive for depression (Stable) and substance abuse (Hx opioid dependence). Negative for suicidal ideas, hallucinations and memory loss. The patient has insomnia (Stable). The patient is not nervous/anxious.     Blood pressure 96/63, pulse 70, temperature 99.4 F (37.4 C), temperature source Oral, resp. rate 16, height 5' 4" (1.626 m), weight 58.968 kg (130 lb), SpO2 100 %, unknown if currently breastfeeding.Body mass index is 22.3 kg/(m^2).  See Md's SRA  Past Psychiatric History:   Opioid use disorder  Musculoskeletal: Strength & Muscle Tone: within normal limits Gait & Station: normal Patient leans: N/A  Past Medical History  Diagnosis Date  . STD (female)     hx of chlamydia and gonorrhea  . History of PID   . Endometriosis   . Glaucoma   . Ovarian cyst   . Bartholin cyst   . Headache(784.0)   . Infection     UTI  . Vaginal Pap smear, abnormal     has not followed up  . Substance abuse    AXIS IV:  economic problems, housing problems, occupational problems, other psychosocial or environmental problems and Polysubstance dependence AXIS V:  60  Level of Care:  OP  Hospital Course: Pt states she was feeling paranoid that someone was trying to hurt her so she was praying in the middle of the street. Pt states it was not a suicide attempt. Today denies SI/HI. Denies AVH. Today states she is a little paranoid but as bad as before. Today she is depressed. Asking for Ativan for anxiety. States she didn't sleep well and Vistaril is not helping. Patient uses Oxycodone and Heroin on a daily basis. Pt reports using cocaine yesterday prior to her admission. She uses it on a sporadic basis. Denies alcohol use. smoking smoking 1 puff per day. Hx of PTSD.  [name] was admitted to the [facility] adult unit with her UDS test results positive for Benzodiazepine, Cocaine & THC. She did admit to having been using Oxycodone & heroin. [name] was also presenting with symptoms of depression, possibly substance induced. She was in need of opioid detox as well as mood stabilization treatments. After admission assessment/evaluation, her presenting symptoms  were detected & identified. The medication regimen targeting those symptoms were discussed & initiated. She received Clonidine detoxification treatment protocols to combat the withdrawal symptoms of opioid. She was also medicated & discharged on; Cogentin 1 mg for prevention of EPS, Hydroxyzine 25 mg for anxiety, Lamictal 25 mg for mood  stabilization, Mirtazapine 7.5 mg for insomnia & Nicotine patch 21 mg for nicotine addiction. [name] was also enrolled & participated in the group counseling sessions being offered & held on this unit. She learned coping skills that should help her cope better & manage his depression/substance abuse issues after discharge. She tolerated her treatment regimen without any adverse effects or reactions reported.  [name] has completed her detox treatment & her mood is also stable. This is evidenced by her reports of improved mood, absence of suicidal ideations & or substance withdrawal symptoms. She is currently being discharged to continue follow-up psychiatric care & medication management as noted below.  She is provided with all the pertinent information needed to make this appointment without problems.  Upon discharge, [name] adamantly denies any SIHI, AVH, delusional thoughts, paranoia or substance withdrawal symptoms. She left [facility] with all personal belongings in no apparent distress. Transportation per boyfriend.  Consults:  psychiatry  Significant Diagnostic Studies:  labs: CBC with diff, CMP, UDS, toxicology tests, U/A,   Discharge Vitals:   Blood pressure 96/63, pulse 70, temperature 99.4 F (37.4 C), temperature source Oral, resp. rate 16, height 5' 4" (1.626 m), weight 58.968 kg (130 lb), SpO2 100 %, unknown if currently breastfeeding. Body mass index is 22.3 kg/(m^2). Lab Results:   Results for orders placed or performed during the hospital encounter of 03/27/15 (from the past 72 hour(s))  Comprehensive metabolic panel     Status: Abnormal   Collection Time: 03/29/15  6:12 PM  Result Value Ref Range   Sodium 137 135 - 145 mmol/L   Potassium 4.3 3.5 - 5.1 mmol/L   Chloride 106 101 - 111 mmol/L   CO2 24 22 - 32 mmol/L   Glucose, Bld 102 (H) 65 - 99 mg/dL   BUN 14 6 - 20 mg/dL   Creatinine, Ser 0.99 0.44 - 1.00 mg/dL   Calcium 9.1 8.9 - 10.3 mg/dL   Total Protein 6.7 6.5 - 8.1 g/dL    Albumin 3.8 3.5 - 5.0 g/dL   AST 26 15 - 41 U/L   ALT 21 14 - 54 U/L   Alkaline Phosphatase 47 38 - 126 U/L   Total Bilirubin 0.5 0.3 - 1.2 mg/dL   GFR calc non Af Amer >60 >60 mL/min   GFR calc Af Amer >60 >60 mL/min    Comment: (NOTE) The eGFR has been calculated using the CKD EPI equation. This calculation has not been validated in all clinical situations. eGFR's persistently <60 mL/min signify possible Chronic Kidney Disease.    Anion gap 7 5 - 15    Comment: Performed at [facility]   Physical Findings:  AIMS: Facial and Oral Movements Muscles of Facial Expression: None, normal Lips and Perioral Area: None, normal Jaw: None, normal Tongue: None, normal,Extremity Movements Upper (arms, wrists, hands, fingers): None, normal Lower (legs, knees, ankles, toes): None, normal, Trunk Movements Neck, shoulders, hips: None, normal, Overall Severity Severity of abnormal movements (highest score from questions above): None, normal Incapacitation due to abnormal movements: None, normal Patient's awareness of abnormal movements (rate only patient's report): No Awareness, Dental Status Current problems with teeth and/or dentures?: No Does patient usually wear dentures?:  No  CIWA:  CIWA-Ar Total: 0 COWS:  COWS Total Score: 2  Psychiatric Specialty Exam: See Psychiatric Specialty Exam and Suicide Risk Assessment completed by Attending Physician prior to discharge.  Discharge destination:  Home  Is patient on multiple antipsychotic therapies at discharge:  No   Has Patient had three or more failed trials of antipsychotic monotherapy by history:  No  Recommended Plan for Multiple Antipsychotic Therapies: NA    Medication List    TAKE these medications      Indication   benztropine 1 MG tablet  Commonly known as:  COGENTIN  Take 1 tablet (1 mg total) by mouth 2 (two) times daily. For prevention of drug induced tremors.   Indication:  Extrapyramidal Reaction  caused by Medications     hydrOXYzine 25 MG tablet  Commonly known as:  ATARAX/VISTARIL  Take 1 tablet (25 mg total) by mouth 3 (three) times daily as needed for anxiety.   Indication:  Anxiety     lamoTRIgine 25 MG tablet  Commonly known as:  LAMICTAL  Take 1 tablet (25 mg total) by mouth daily. For mood stabilization   Indication:  Mood stabilization     mirtazapine 7.5 MG tablet  Commonly known as:  REMERON  Take 1 tablet (7.5 mg total) by mouth at bedtime. For sleep   Indication:  Trouble Sleeping     nicotine 21 mg/24hr patch  Commonly known as:  NICODERM CQ - dosed in mg/24 hours  Place 1 patch (21 mg total) onto the skin daily. For smoking cessation   Indication:  Nicotine Addiction       Follow-up Information    Follow up with [facility].   Why:  Call [facility] upon discharge to schedule assessment (Clinical Social Worker will fax referral when office sends form today).    Contact information:   [place] [place] [address] [place], [place] [address] Office: [phone] Fax: [phone]      Follow up with [facility].   Why:  Walk in between 8am-9am Monday through Friday for hospital follow-up/medication management/assessment for services until set up with [facility].    Contact information:   [address], [place] [address] Phone: [phone] Fax: [phone]     Follow-up recommendations: Activity:  As tolerated Diet: As recommended by your primary care doctor. Keep all scheduled follow-up appointments as recommended.   Comments:  Take all your medications as prescribed by your mental healthcare provider. Report any adverse effects and or reactions from your medicines to your outpatient provider promptly. Patient is instructed and cautioned to not engage in alcohol and or illegal drug use while on prescription medicines. In the event of worsening symptoms, patient is instructed to call the crisis hotline, 911 and or go  to the nearest ED for appropriate evaluation and treatment of symptoms. Follow-up with your primary care provider for your other medical issues, concerns and or health care needs.   Total Discharge Time:  Greater than 30 minutes.  Signed: Encarnacion Slates, PMHNP-BC 03/31/2015, 1:41 PM  I personally assessed the patient and formulated the plan Geralyn Flash A. Sabra Heck, M.D.

## 2015-03-30 NOTE — [facility] Suicide Risk Assessment (Signed)
[facility] INPATIENT:  Family/Significant Other Suicide Prevention Education  Suicide Prevention Education:  Contact Attempts: [name] (pt's boyfriend) [phone] has been identified by the patient as the family member/significant other with whom the patient will be residing, and identified as the person(s) who will aid the patient in the event of a mental health crisis.  With written consent from the patient, two attempts were made to provide suicide prevention education, prior to and/or following the patient's discharge.  We were unsuccessful in providing suicide prevention education.  A suicide education pamphlet was given to the patient to share with family/significant other. Pt also provided with mobile crisis information.   Date and time of first attempt: 9:45AM 03/30/15 (with patient). Unable to leave voicemail.  Date and time of second attempt: 03/30/15 at 12:30PM (unable to leave voicemail).   Smart, Kathy Hallisey LCSW 03/30/2015, 12:31 PM

## 2015-03-30 NOTE — Progress Notes (Signed)
  [facility] :  Will you be returning to the same living situation after discharge:  Yes,  home At discharge, do you have transportation home?: Yes,  boyfriend or friend Do you have the ability to pay for your medications: Yes,  mental health  Release of information consent forms completed and submitted to medical records by CSW. Patient to Follow up at: Follow-up Information    Follow up with [facility].   Why:  [facility] upon discharge to schedule assessment (Clinical Social Worker will fax referral when office sends form today).    Contact information:   [address], [address] [place], [place] [address] Office: [phone] Fax: [phone]      Follow up with [facility].   Why:  Walk in between 8am-9am Monday through Friday for hospital follow-up/medication management/assessment for services until set up with [facility].    Contact information:   [address] [place], [place] [address] Phone: [phone] Fax: [phone]      Next level of care provider has access to [facility] Link:no  Patient denies SI/HI: Yes,  during self report    Safety Planning and Suicide Prevention discussed: Yes,  SPE completed with pt, as contact attempts were made with pt's boyfriend. SPI pamphlet and mobile crisis information provided to pt.   Have you used any form of tobacco in the last 30 days? (Cigarettes, Smokeless Tobacco, Cigars, and/or Pipes): Yes  Has patient been referred to the Quitline?: Patient refused referral  Smart, Lytle ButteHeather LCSW 03/30/2015, 12:33 PM

## 2015-06-06 ENCOUNTER — Encounter (HOSPITAL_COMMUNITY): Payer: Self-pay | Admitting: *Deleted

## 2015-06-06 ENCOUNTER — Emergency Department (HOSPITAL_COMMUNITY)
Admission: EM | Admit: 2015-06-06 | Discharge: 2015-06-06 | Disposition: A | Discharge status: Home / Self Care | Payer: Medicaid Other | Attending: Emergency Medicine | Admitting: Emergency Medicine

## 2015-06-06 DIAGNOSIS — F419 Anxiety disorder, unspecified: Secondary | ICD-10-CM | POA: Diagnosis not present

## 2015-06-06 DIAGNOSIS — F1414 Cocaine abuse with cocaine-induced mood disorder: Secondary | ICD-10-CM | POA: Diagnosis not present

## 2015-06-06 DIAGNOSIS — Z91040 Latex allergy status: Secondary | ICD-10-CM | POA: Diagnosis not present

## 2015-06-06 DIAGNOSIS — F1410 Cocaine abuse, uncomplicated: Secondary | ICD-10-CM | POA: Diagnosis present

## 2015-06-06 DIAGNOSIS — Z79899 Other long term (current) drug therapy: Secondary | ICD-10-CM | POA: Insufficient documentation

## 2015-06-06 DIAGNOSIS — Z87440 Personal history of urinary (tract) infections: Secondary | ICD-10-CM | POA: Diagnosis not present

## 2015-06-06 DIAGNOSIS — Z8669 Personal history of other diseases of the nervous system and sense organs: Secondary | ICD-10-CM | POA: Diagnosis not present

## 2015-06-06 DIAGNOSIS — F17200 Nicotine dependence, unspecified, uncomplicated: Secondary | ICD-10-CM | POA: Insufficient documentation

## 2015-06-06 DIAGNOSIS — Z8619 Personal history of other infectious and parasitic diseases: Secondary | ICD-10-CM | POA: Diagnosis not present

## 2015-06-06 DIAGNOSIS — Z8742 Personal history of other diseases of the female genital tract: Secondary | ICD-10-CM | POA: Diagnosis not present

## 2015-06-06 DIAGNOSIS — F1494 Cocaine use, unspecified with cocaine-induced mood disorder: Secondary | ICD-10-CM

## 2015-06-06 DIAGNOSIS — F22 Delusional disorders: Secondary | ICD-10-CM | POA: Insufficient documentation

## 2015-06-06 LAB — BASIC METABOLIC PANEL
Anion gap: 9 (ref 5–15)
BUN: 7 mg/dL (ref 6–20)
CALCIUM: 9 mg/dL (ref 8.9–10.3)
CO2: 26 mmol/L (ref 22–32)
Chloride: 105 mmol/L (ref 101–111)
Creatinine, Ser: 0.76 mg/dL (ref 0.44–1.00)
GFR calc Af Amer: >60 mL/min (ref >60)
GLUCOSE: 123 mg/dL — AB (ref 65–99)
POTASSIUM: 3.4 mmol/L — AB (ref 3.5–5.1)
Sodium: 140 mmol/L (ref 135–145)

## 2015-06-06 LAB — CBC WITH DIFFERENTIAL/PLATELET
BASOS ABS: 0 10*3/uL (ref 0.0–0.1)
Basophils Relative: 0 %
EOS PCT: 1 %
Eosinophils Absolute: 0.1 10*3/uL (ref 0.0–0.7)
HCT: 36.4 % (ref 36.0–46.0)
Hemoglobin: 12.5 g/dL (ref 12.0–15.0)
LYMPHS PCT: 23 %
Lymphs Abs: 1.6 10*3/uL (ref 0.7–4.0)
MCH: 30 pg (ref 26.0–34.0)
MCHC: 34.3 g/dL (ref 30.0–36.0)
MCV: 87.5 fL (ref 78.0–100.0)
MONO ABS: 0.5 10*3/uL (ref 0.1–1.0)
MONOS PCT: 8 %
Neutro Abs: 4.8 10*3/uL (ref 1.7–7.7)
Neutrophils Relative %: 68 %
PLATELETS: 340 10*3/uL (ref 150–400)
RBC: 4.16 MIL/uL (ref 3.87–5.11)
RDW: 14.1 % (ref 11.5–15.5)
WBC: 7 10*3/uL (ref 4.0–10.5)

## 2015-06-06 MED ORDER — LORAZEPAM 2 MG/ML IJ SOLN: 2.0000 mg | Freq: Once | INTRAMUSCULAR | Status: DC

## 2015-06-06 MED ORDER — DIPHENHYDRAMINE HCL 50 MG/ML IJ SOLN
12.5000 mg | Freq: Once | INTRAMUSCULAR | Status: AC
  Administered 2015-06-06: 12.5 mg via INTRAMUSCULAR
  Filled 2015-06-06: qty 1

## 2015-06-06 MED ORDER — HALOPERIDOL LACTATE 5 MG/ML IJ SOLN
2.5000 mg | Freq: Once | INTRAMUSCULAR | Status: AC
  Administered 2015-06-06: 2.5 mg via INTRAMUSCULAR
  Filled 2015-06-06: qty 1

## 2015-06-06 MED ORDER — LORAZEPAM 2 MG/ML IJ SOLN
2.0000 mg | Freq: Once | INTRAMUSCULAR | Status: AC
  Administered 2015-06-06: 2 mg via INTRAMUSCULAR
  Filled 2015-06-06: qty 1

## 2015-06-06 NOTE — ED Notes (Signed)
Pt still drowsy from medications and awakens for short periods of time, unable to discharge at this time.

## 2015-06-06 NOTE — Discharge Instructions (Signed)
Stimulant Use Disorder-Cocaine Cocaine is one of a group of powerful drugs called stimulants. Cocaine has medical uses for stopping nosebleeds and for pain control before minor nose or dental surgery. However, cocaine is misused because of the effects that it produces. These effects include:   A feeling of extreme pleasure.  Alertness.  High energy. Common street names for cocaine include coke, crack, blow, snow, and nose candy. Cocaine is snorted, dissolved in water and injected, or smoked.  Stimulants are addictive because they activate regions of the brain that produce both the pleasurable sensation of "reward" and psychological dependence. Together, these actions account for loss of control and the rapid development of drug dependence. This means you become ill without the drug (withdrawal) and need to keep using it to function.  Stimulant use disorder is use of stimulants that disrupts your daily life. It disrupts relationships with family and friends and how you do your job. Cocaine increases your blood pressure and heart rate. It can cause a heart attack or stroke. Cocaine can also cause death from irregular heart rate or seizures. SYMPTOMS Symptoms of stimulant use disorder with cocaine include:   Emergency Department Resource Guide 1) Find a Doctor and Pay Out of Pocket Although you won't have to find out who is covered by your insurance plan, it is a good idea to ask around and get recommendations. You will then need to call the office and see if the doctor you have chosen will accept you as a new patient and what types of options they offer for patients who are self-pay. Some doctors offer discounts or will set up payment plans for their patients who do not have insurance, but you will need to ask so you aren't surprised when you get to your appointment.  2) Contact Your Local Health Department Not all health departments have doctors that can see patients for sick visits, but many do,  so it is worth a call to see if yours does. If you don't know where your local health department is, you can check in your phone book. The CDC also has a tool to help you locate your state's health department, and many state websites also have listings of all of their local health departments.  3) Find a Walk-in Clinic If your illness is not likely to be very severe or complicated, you may want to try a walk in clinic. These are popping up all over the country in pharmacies, drugstores, and shopping centers. They're usually staffed by nurse practitioners or physician assistants that have been trained to treat common illnesses and complaints. They're usually fairly quick and inexpensive. However, if you have serious medical issues or chronic medical problems, these are probably not your best option.  No Primary Care Doctor: - Call Health Connect at  [phone] - they can help you locate a primary care doctor that  accepts your insurance, provides certain services, etc. - Physician Referral Service- [phone]  Chronic Pain Problems: Organization         Address  Phone   Notes  [facility]  [phone] Patients need to be referred by their primary care doctor.   Medication Assistance: Organization         Address  Phone   Notes  [facility] Medication [facility] [address] [place], [place] [address] [phone] --Must be a resident of [facility] -- Must have NO insurance coverage whatsoever (no Medicaid/ Medicare, etc.) -- The pt.  pt. MUST have a primary care doctor that directs their care regularly and follows them in the community °  °MedAssist  (866) 331-1348   °United Way  (888) 892-1162   ° °Agencies that provide inexpensive medical care: °Organization         Address  Phone   Notes  °[facility]  (336) 832-8035   °[facility]    (336) 832-7272   °[facility] [address] °[place], [place] [address] (336) 832-4777   °[facility] [address], °[place] (336) 271-4999   °[facility]    (336) 373-0678   °[facility]    (336) 272-1050   °[facility] ° [address], [place] Phone:  (336) 832-4444, Fax:  (336) 832-4440 Hours of Operation:  9 am - 6 pm, M-F.  Also accepts Medicaid/Medicare and self-pay.  °[facility] ° [address], [place] Phone: (336) [phone], Fax: (336) [phone]. Hours of Operation:  8:30 am - 5:30 pm, M-F.  Also accepts Medicaid and self-pay.  °[facility] [place] [address], [place] Phone: (336) 878-6027   °[facility] [address], [place], [place] (336)723-1848, Ext. 123 Mondays & Thursdays: 7-9 AM.  First 15 patients are seen on a first come, first serve basis. °  ° °Medicaid-accepting [place] Providers: ° °Organization         Address  Phone   Notes  °[facility] [address], [place] ([phone]) [phone] Also accepts self-pay patients.  °[facility] [address], [place] ° (336) [phone]   °[facility] [address], [place] (336) 288-8857   °[facility] [address] [place] [address], [place] (336) 299-7000   °Veita Bland [address], [place]  ° (336) [phone] Only accepts [place] Access Medicaid patients after they have their name applied to their card.  ° °Self-Pay (no insurance) in [place]: ° °Organization         Address  Phone   Notes  °Sickle Cell Patients, [facility] [address], [place] (336) 832-1970   ° [facility] [address], [place] (336) 832-4400   ° [facility] ° [address], [place] (336) 992-4800   °[facility]/Dr. Osei-Bonsu ° [address] [place] [address], [place] or  [address], [place] (336) [phone] Phone number for both [place] and [place] locations is the same.  °[facility] [address], [place] (336) [phone]   °[facility] [address] [place] [address], [place] or [address] (336) 852-7530 °(336) 878-2260   °[facility] [address], [place] (336) 350-1642, phone; (336) 294-5005, fax Sees patients 1st and 3rd Saturday of every month.  Must not qualify for public or private insurance (i.e. Medicaid, Medicare, [place] Health Choice, Veterans' Benefits) • Household income should be no more than 200% of the poverty level •The clinic cannot treat you if you are pregnant or think you are pregnant • Sexually transmitted diseases are not treated at the clinic.  ° ° °Dental Care: °Organization         Address  Phone  Notes  °[place] [facility] [address], [place] (336) 641-6152 Accepts children up to age 21 who are enrolled in Medicaid or [place] Health Choice; pregnant women with a Medicaid card; and children who have applied for Medicaid or [place] Health Choice, but were declined, whose parents can pay a reduced fee at time of service.  °[place] [facility] [place]  [address], [place] (336) 641-7733 Accepts children up to age 21 who are enrolled in Medicaid or [place] Health Choice; pregnant women with a Medicaid card; and children who have applied for Medicaid or [place] Health Choice, but were declined, whose parents can pay a reduced fee at time of service.  °[facility] ° [address], [place] (336) 641-4533 Patients are seen by appointment only. Walk-ins are not accepted. [facility] will see patients 18 years of age and older. °Monday - Tuesday (8am-5pm) °Most Wednesdays (8:30-5pm) °$30 per visit, cash only  °[facility] ° [address], [place] (336) 641-4533 Patients are seen by appointment only. Walk-ins are not accepted. [facility] will see patients 18 years of age and older. °One Wednesday Evening (Monthly: Volunteer Based).  $30 per visit, cash only  °[facility]  (919) [phone] for adults; Children under age 4, call Graduate Pediatric Dentistry at ([phone]) [phone]. Children aged 4-14, please call ([phone]) [phone] to request a pediatric application. ° Dental services are provided in all areas of dental care including fillings, crowns and bridges, complete and partial dentures, implants, gum treatment, root canals, and extractions. Preventive care is also provided. Treatment is provided to both adults and children. °Patients are selected via a lottery and there is often a waiting list. °  °[facility] [address], °[place] ° (336) 763-8833 www.drcivils.com °  °[facility] [address], [place], [place] (336)723-1848, Ext. 123 Second and Fourth Thursday of each month, opens at 6:30 AM; Clinic ends at 9 AM.  Patients are seen on a first-come first-served basis, and a limited number are seen during each clinic.  ° °[facility] ° [address], [place], [place] (336) [phone]   Eligibility Requirements °You must have lived in [place], [place], or [place] counties for at least the last three months. °  You cannot be eligible for state or federal sponsored healthcare insurance, including Veterans Administration, Medicaid, or Medicare. °  You generally cannot be eligible for healthcare insurance through your employer.  °  How to apply: °Eligibility screenings are held every Tuesday and Wednesday afternoon from 1:00 pm until 4:00 pm. You do not need an appointment for the interview!  °[facility] [address], [place], [place] 336-631-2330   °[facility]  336-342-8273   °[place]  336-703-3100   °[place] [facility]  336-570-6415   ° °Behavioral Health Resources in the Community: °Intensive Outpatient Programs °Organization         Address  Phone  Notes  °[facility] [address], [place], [place] 336-878-6098   ° [facility] [address], [place], [place] 336-832-9800   °[facility] [address], [place], [place] ° 336-882-2125   °[place] [address],  °, [place] 1-800-853-5163 or 336-641-4981   °Substance Abuse Resources °Organization         Address  Phone  Notes  °Al[facility] and   Services  (831)151-3679   [facility]  517-273-6974   [facility]  5104532961   Floydene Flock  207-611-4413   Residential & Outpatient Substance Abuse Program  684-780-2619   Psychological Services Organization         Address  Phone  Notes  [facility]  336(365) [phone]   [facility]  548-069-5009   [facility] [address] [place] 561-524-0733 or (782) 080-6127    Mobile Crisis Teams Organization         Address  Phone  Notes  [facility], Mobile Crisis Care Unit  682-525-3082   [facility]  [address] [place], [place] 542-706-2376   Doristine Locks [address] [place] 283-151-7616    Self-Help/Support Groups Organization         Address  Phone             Notes  [facility] - variety of support groups  336- I7437963 Call for more information  Narcotics Anonymous (NA), [facility] [address], [employer] [place]  2 meetings at this location   Statistician         Address  Phone  Notes  [facility] [address],    [place]  [phone]   [facility]  [address] [place] [address], [place], [place] [phone]   [facility] [address] [place], [place] [phone]  Admissions: 8am-3pm M-F  [facility] [address]    [place], [place] [phone]   [facility] [address] [place], [place], [place] [phone]   [facility] [address],  [place], [place] [phone]   [facility] - Intensive Outpatient [address], [place], [place] [phone]   [facility] ([facility] [address] [place]  [place], [place] 9-509-326-7124 or [phone]   [facility] ([facility] [address], [place], [place] 505-397-6734 Accepts Medicaid  [facility] [address],  [place] 1-937-902-4097 Substance Abuse/Addiction Treatment   [facility] Organization         Address  Phone  Notes  [facility]  (605) 631-7461   Angie Fava, PhD [address] [place], [place]   6194467854 or 917-623-8469   [facility]   [address] [place], [place] (669)[phone]   [facility] [address], [place], [place] (706)411-1333 Insurance/Medicaid/sponsorship through [facility] [address]                                    [place], [place] (904)[phone] Therapy/tele-psych/case  [facility] [address], [place] [phone]    Dr. Lolly Mustache  [phone]   [facility]  [employer] [facility] Dept. 1) [address] [place] 2) [address], [place] 3)  [address], [place] [phone]  [phone]   [facility] Child Abuse Hotline (631)770-8269 or [phone] (After Hours)        Use of cocaine in larger amounts or over a longer period of time than intended.  Unsuccessful attempts to cut down or control cocaine use.  A lot of time spent  obtaining, using, or recovering from the effects of cocaine.  A strong desire or urge to use cocaine (craving).  Continued use of  cocaine in spite of major problems at work, school, or home because of use.  Continued use of cocaine in spite of relationship problems because of use.  Giving up or cutting down on important life activities because of cocaine use.  Use of cocaine over and over in situations when it is physically hazardous, such as driving a car.  Continued use of cocaine in spite of a physical problem that is likely related to use. Physical problems can include:  Malnutrition.  Nosebleeds.  Chest pain.  High blood pressure.  A hole that develops between the part of your nose that separates your nostrils (perforated nasal septum).  Lung and kidney damage.  Continued use of cocaine in spite of a mental problem that is likely related to use. Mental problems can include:  Schizophrenia-like symptoms.  Depression.  Bipolar mood swings.  Anxiety.  Sleep problems.  Need to use more and more cocaine to get the same effect, or lessened effect over time with use of the same amount of cocaine (tolerance).  Having withdrawal symptoms when cocaine use is stopped, or using cocaine to reduce or avoid withdrawal symptoms. Withdrawal symptoms include:  Depressed or irritable mood.  Low energy or restlessness.  Bad dreams.  Poor or excessive sleep.  Increased appetite. DIAGNOSIS Stimulant use disorder is diagnosed by your health care provider. You may be asked questions about your cocaine use and how it affects your life. A physical exam may be done. A drug screen may be ordered. You may be referred to a mental health professional. The diagnosis of stimulant use disorder requires at least two symptoms within 12 months. The type of stimulant use disorder depends on the number of signs and symptoms you have. The type may be:  Mild. Two or three signs and symptoms.  Moderate. Four or five signs and symptoms.  Severe. Six or more signs and symptoms. TREATMENT Treatment for stimulant use disorder is  usually provided by mental health professionals with training in substance use disorders. The following options are available:  Counseling or talk therapy. Talk therapy addresses the reasons you use cocaine and ways to keep you from using again. Goals of talk therapy include:  Identifying and avoiding triggers for use.  Handling cravings.  Replacing use with healthy activities.  Support groups. Support groups provide emotional support, advice, and guidance.  Medicine. Certain medicines may decrease cocaine cravings or withdrawal symptoms. HOME CARE INSTRUCTIONS  Take medicines only as directed by your health care provider.  Identify the people and activities that trigger your cocaine use and avoid them.  Keep all follow-up visits as directed by your health care provider. SEEK MEDICAL CARE IF:  Your symptoms get worse or you relapse.  You are not able to take medicines as directed. SEEK IMMEDIATE MEDICAL CARE IF:  You have serious thoughts about hurting yourself or others.  You have a seizure, chest pain, sudden weakness, or loss of speech or vision. FOR MORE INFORMATION  National Institute on Drug Abuse: http://www.price-smith.com/  Substance Abuse and Mental Health Services Administration: SkateOasis.com.pt   This information is not intended to replace advice given to you by your health care provider. Make sure you discuss any questions you have with your health care provider.   Document Released: 04/08/2000 Document Revised: 05/02/2014 Document Reviewed: 04/24/2013 Elsevier Interactive Patient Education [employer]

## 2015-06-06 NOTE — ED Notes (Signed)
Pt sleeping. 

## 2015-06-06 NOTE — ED Notes (Signed)
Pt given some juice, a sandwich and cheese.  Pt was observed ambulating in the room to the bathroom without any issue.

## 2015-06-06 NOTE — ED Notes (Signed)
Pt provided with sandwiches, applesauce and graham crackers - Dr. Radford Pax advised pt is appropriate to eat at this time.

## 2015-06-06 NOTE — ED Notes (Signed)
Woke pt up and pt requested to have the phone to find a ride.  Pt reports she is still sleepy.

## 2015-06-06 NOTE — ED Notes (Signed)
Per [facility] - pt w/ hx of depression and substance abuse, admits to using crack-cocaine this morning after having been on a saboxone Program. Pt tearful on arrival and restless. Denies SI - c/o abd and back pain at present.

## 2015-06-06 NOTE — ED Notes (Signed)
Bed: WA18 Expected date:  Expected time:  Means of arrival:  Comments: EMS/abd. pain 

## 2015-06-06 NOTE — ED Provider Notes (Signed)
CSN: [id number]     Arrival date & time 06/06/15  0808 History   First MD Initiated Contact with Patient 06/06/15 [phone]     Chief Complaint  Patient presents with  . Depression  . Addiction Problem      HPI Per [facility] - pt w/ hx of depression and substance abuse, admits to using crack-cocaine this morning after having been on a saboxone Program. Pt tearful on arrival and restless. Denies SI - c/o abd and back pain at present. Past Medical History  Diagnosis Date  . STD (female)     hx of chlamydia and gonorrhea  . History of PID   . Endometriosis   . Glaucoma   . Ovarian cyst   . Bartholin cyst   . Headache(784.0)   . Infection     UTI  . Vaginal Pap smear, abnormal     has not followed up  . Substance abuse    Past Surgical History  Procedure Laterality Date  . Fracture surgery      left leg  . Dilation and curettage of uterus    . Laparoscopy     Family History  Problem Relation Age of Onset  . Anesthesia problems Neg Hx   . Hypotension Neg Hx   . Malignant hyperthermia Neg Hx   . Pseudochol deficiency Neg Hx   . Alcohol abuse Neg Hx   . Diabetes Mother   . Hypertension Maternal Grandmother   . Heart disease Maternal Grandmother     great grandma   Social History  Substance Use Topics  . Smoking status: Current Every Day Smoker -- 0.25 packs/day for 7 years  . Smokeless tobacco: Never Used  . Alcohol Use: No     Comment: Denies ETOH use   OB History    Gravida Para Term Preterm AB TAB SAB Ectopic Multiple Living   0 2 0 0 2     Review of Systems  Musculoskeletal: Positive for back pain.      Allergies  Acyclovir and related; Darvocet; Doxycycline; Flexeril; Metoclopramide; Naproxen; Latex; and Tramadol  Home Medications   Prior to Admission medications   Medication Sig Start Date End Date Taking? Authorizing Provider  SUBOXONE 8-2 MG FILM Place 1 strip under the tongue twice daily 05/29/15  Yes Historical Provider, MD  benztropine  (COGENTIN) 1 MG tablet Take 1 tablet (1 mg total) by mouth 2 (two) times daily. For prevention of drug induced tremors. Patient not taking: Reported on 06/06/2015 03/30/15   Sanjuana Kava, NP  hydrOXYzine (ATARAX/VISTARIL) 25 MG tablet Take 1 tablet (25 mg total) by mouth 3 (three) times daily as needed for anxiety. Patient not taking: Reported on 06/06/2015 03/30/15   Sanjuana Kava, NP  lamoTRIgine (LAMICTAL) 25 MG tablet Take 1 tablet (25 mg total) by mouth daily. For mood stabilization Patient not taking: Reported on 06/06/2015 03/30/15   Sanjuana Kava, NP  mirtazapine (REMERON) 7.5 MG tablet Take 1 tablet (7.5 mg total) by mouth at bedtime. For sleep Patient not taking: Reported on 06/06/2015 03/30/15   Sanjuana Kava, NP  nicotine (NICODERM CQ - DOSED IN MG/24 HOURS) 21 mg/24hr patch Place 1 patch (21 mg total) onto the skin daily. For smoking cessation Patient not taking: Reported on 06/06/2015 03/30/15   Sanjuana Kava, NP   BP 116/72 mmHg  Pulse 79  Temp(Src) 98.9 F (37.2 C) (Oral)  Resp 12  SpO2 92%  LMP  (LMP  Unknown) Physical Exam  Constitutional: She is oriented to person, place, and time. She appears well-developed and well-nourished. No distress.  HENT:  Head: Normocephalic and atraumatic.  Eyes: Pupils are equal, round, and reactive to light.  Neck: Normal range of motion.  Cardiovascular: Normal rate and intact distal pulses.   Pulmonary/Chest: No respiratory distress.  Abdominal: Normal appearance. She exhibits no distension.  Musculoskeletal: Normal range of motion.  Neurological: She is alert and oriented to person, place, and time. No cranial nerve deficit.  Skin: Skin is warm and dry. No rash noted.  Psychiatric: Her mood appears anxious. She is agitated and hyperactive. She is not actively hallucinating. Thought content is paranoid. She expresses no homicidal and no suicidal ideation.  Nursing note and vitals reviewed.   ED Course  Procedures (including critical care  time) Medications  haloperidol lactate (HALDOL) injection 2.5 mg (2.5 mg Intramuscular Given 06/06/15 0828)  diphenhydrAMINE (BENADRYL) injection 12.5 mg (12.5 mg Intramuscular Given 06/06/15 0829)  LORazepam (ATIVAN) injection 2 mg (2 mg Intramuscular Given 06/06/15 0828)    Labs Review Labs Reviewed  BASIC METABOLIC PANEL - Abnormal; Notable for the following:    Potassium 3.4 (*)    Glucose, Bld 123 (*)    All other components within normal limits  CBC WITH DIFFERENTIAL/PLATELET    Imaging Review No results found. I have personally reviewed and evaluated these images and lab results as part of my medical decision-making.  After medication patient slept peacefully for 2-3 hours.  Patient did eat with no difficulty.  Discharged with documentation for resources on substance abuse.  MDM   Final diagnoses:  Cocaine-induced mood disorder (HCC)        Nelva Nay, MD 06/06/15 1316

## 2015-07-13 ENCOUNTER — Inpatient Hospital Stay (HOSPITAL_COMMUNITY)
Admission: AD | Admit: 2015-07-13 | Discharge: 2015-07-13 | Disposition: A | Discharge status: Home / Self Care | Payer: Medicaid Other | Source: Ambulatory Visit | Attending: Family Medicine | Admitting: Family Medicine

## 2015-07-13 ENCOUNTER — Encounter (HOSPITAL_COMMUNITY): Payer: Self-pay | Admitting: *Deleted

## 2015-07-13 DIAGNOSIS — R112 Nausea with vomiting, unspecified: Secondary | ICD-10-CM

## 2015-07-13 DIAGNOSIS — R1030 Lower abdominal pain, unspecified: Secondary | ICD-10-CM | POA: Diagnosis present

## 2015-07-13 DIAGNOSIS — A5901 Trichomonal vulvovaginitis: Secondary | ICD-10-CM

## 2015-07-13 DIAGNOSIS — F17210 Nicotine dependence, cigarettes, uncomplicated: Secondary | ICD-10-CM | POA: Insufficient documentation

## 2015-07-13 DIAGNOSIS — N739 Female pelvic inflammatory disease, unspecified: Secondary | ICD-10-CM | POA: Insufficient documentation

## 2015-07-13 DIAGNOSIS — N730 Acute parametritis and pelvic cellulitis: Secondary | ICD-10-CM | POA: Diagnosis not present

## 2015-07-13 LAB — CBC
HCT: 41.5 % (ref 36.0–46.0)
HEMOGLOBIN: 14.4 g/dL (ref 12.0–15.0)
MCH: 30.1 pg (ref 26.0–34.0)
MCHC: 34.7 g/dL (ref 30.0–36.0)
MCV: 86.8 fL (ref 78.0–100.0)
Platelets: 413 10*3/uL — ABNORMAL HIGH (ref 150–400)
RBC: 4.78 MIL/uL (ref 3.87–5.11)
RDW: 15.1 % (ref 11.5–15.5)
WBC: 16.5 10*3/uL — ABNORMAL HIGH (ref 4.0–10.5)

## 2015-07-13 LAB — URINALYSIS, ROUTINE W REFLEX MICROSCOPIC
BILIRUBIN URINE: NEGATIVE
Glucose, UA: NEGATIVE mg/dL
KETONES UR: NEGATIVE mg/dL
NITRITE: NEGATIVE
Protein, ur: NEGATIVE mg/dL
SPECIFIC GRAVITY, URINE: 1.015 (ref 1.005–1.030)
pH: 6 (ref 5.0–8.0)

## 2015-07-13 LAB — WET PREP, GENITAL
SPERM: NONE SEEN
YEAST WET PREP: NONE SEEN

## 2015-07-13 LAB — COMPREHENSIVE METABOLIC PANEL
ALK PHOS: 52 U/L (ref 38–126)
ALT: 30 U/L (ref 14–54)
AST: 24 U/L (ref 15–41)
Albumin: 4.3 g/dL (ref 3.5–5.0)
Anion gap: 7 (ref 5–15)
BILIRUBIN TOTAL: 0.4 mg/dL (ref 0.3–1.2)
BUN: 22 mg/dL — ABNORMAL HIGH (ref 6–20)
CALCIUM: 8.6 mg/dL — AB (ref 8.9–10.3)
CO2: 27 mmol/L (ref 22–32)
CREATININE: 0.72 mg/dL (ref 0.44–1.00)
Chloride: 106 mmol/L (ref 101–111)
Glucose, Bld: 116 mg/dL — ABNORMAL HIGH (ref 65–99)
Potassium: 4.1 mmol/L (ref 3.5–5.1)
Sodium: 140 mmol/L (ref 135–145)
Total Protein: 7.4 g/dL (ref 6.5–8.1)

## 2015-07-13 LAB — URINE MICROSCOPIC-ADD ON: RBC / HPF: NONE SEEN RBC/hpf (ref 0–5)

## 2015-07-13 LAB — RAPID URINE DRUG SCREEN, HOSP PERFORMED
Amphetamines: NOT DETECTED
Barbiturates: NOT DETECTED
Benzodiazepines: NOT DETECTED
Cocaine: NOT DETECTED
OPIATES: NOT DETECTED
Tetrahydrocannabinol: POSITIVE — AB

## 2015-07-13 LAB — POCT PREGNANCY, URINE: PREG TEST UR: NEGATIVE

## 2015-07-13 MED ORDER — CEFTRIAXONE SODIUM 1 G IJ SOLR: 2.0000 g | Freq: Once | INTRAMUSCULAR | Status: DC

## 2015-07-13 MED ORDER — CEFTRIAXONE SODIUM 250 MG IJ SOLR
250.0000 mg | Freq: Once | INTRAMUSCULAR | Status: AC
  Administered 2015-07-13: 250 mg via INTRAMUSCULAR
  Filled 2015-07-13: qty 250

## 2015-07-13 MED ORDER — AZITHROMYCIN 500 MG PO TABS
1000.0000 mg | ORAL_TABLET | Freq: Once | ORAL | Status: DC
Start: 2015-07-20 — End: 2015-07-13

## 2015-07-13 MED ORDER — ONDANSETRON 4 MG PO TBDP: 4.0000 mg | ORAL_TABLET | Freq: Three times a day (TID) | ORAL | Status: DC | PRN

## 2015-07-13 MED ORDER — ONDANSETRON HCL 4 MG/2ML IJ SOLN
4.0000 mg | Freq: Once | INTRAMUSCULAR | Status: AC
  Administered 2015-07-13: 4 mg via INTRAVENOUS
  Filled 2015-07-13: qty 2

## 2015-07-13 MED ORDER — METRONIDAZOLE 500 MG PO TABS
2000.0000 mg | ORAL_TABLET | Freq: Once | ORAL | Status: AC
  Administered 2015-07-13: 2000 mg via ORAL
  Filled 2015-07-13: qty 4

## 2015-07-13 MED ORDER — AZITHROMYCIN 500 MG PO TABS
1000.0000 mg | ORAL_TABLET | Freq: Once | ORAL | Status: DC
Start: 2015-07-20 — End: 2015-11-08

## 2015-07-13 MED ORDER — AZITHROMYCIN 250 MG PO TABS
1000.0000 mg | ORAL_TABLET | Freq: Once | ORAL | Status: AC
  Administered 2015-07-13: 1000 mg via ORAL
  Filled 2015-07-13: qty 4

## 2015-07-13 MED ORDER — IBUPROFEN 800 MG PO TABS
800.0000 mg | ORAL_TABLET | Freq: Once | ORAL | Status: AC
  Administered 2015-07-13: 800 mg via ORAL
  Filled 2015-07-13: qty 1

## 2015-07-13 MED ORDER — SODIUM CHLORIDE 0.9 % IV SOLN
INTRAVENOUS | Status: DC
  Administered 2015-07-13: 09:00:00 via INTRAVENOUS

## 2015-07-13 NOTE — MAU Provider Note (Signed)
History     CSN: [id number]  Arrival date and time: 07/13/15 1751   First Provider Initiated Contact with Patient 07/13/15 0901      Chief Complaint  Patient presents with  . Emesis  . Possible Pregnancy  . Abdominal Pain   HPI   Ms.[name] is a 30 y.o. female [phone]; non pregnant female presenting to MAU with N/V and abdominal pain. She has a history of PID and substance of abuse, she is currently taking suboxone.   Her suboxone dose is 8 mg 3x per day. Patient reports nausea and vomiting that began last night. The vomiting has continued until arrival at the MAU this morning. Pt also reports abdominal and back pain that began at a similar time. Pt denies diarrhea, sore throat, fever, or cough. Pt endorses chills and headaches. Pt asked if she had ever experienced withdrawal symptoms, she reported yes and that this was different. Patient has not had burning with urination and reports regular bowel movements (last one today). Pt denies blood in vomit.  Last menstrual period was mid-January, and she reports otherwise regular periods.  OB History    Gravida Para Term Preterm AB TAB SAB Ectopic Multiple Living   _0 0 2 0 0 2      Past Medical History  Diagnosis Date  . STD (female)     hx of chlamydia and gonorrhea  . History of PID   . Endometriosis   . Glaucoma   . Ovarian cyst   . Bartholin cyst   . Headache(784.0)   . Infection     UTI  . Vaginal Pap smear, abnormal     has not followed up  . Substance abuse     Past Surgical History  Procedure Laterality Date  . Fracture surgery      left leg  . Dilation and curettage of uterus    . Laparoscopy      Family History  Problem Relation Age of Onset  . Anesthesia problems Neg Hx   . Hypotension Neg Hx   . Malignant hyperthermia Neg Hx   . Pseudochol deficiency Neg Hx   . Alcohol abuse Neg Hx   . Diabetes Mother   . Hypertension Maternal Grandmother   . Heart disease Maternal Grandmother      great grandma    Social History  Substance Use Topics  . Smoking status: Current Every Day Smoker -- 0.25 packs/day for 7 years  . Smokeless tobacco: Never Used  . Alcohol Use: No     Comment: Denies ETOH use    Allergies:  Allergies  Allergen Reactions  . Acyclovir And Related Swelling  . Darvocet [Propoxyphene N-Acetaminophen] Hives  . Doxycycline Swelling  . Flexeril [Cyclobenzaprine] Nausea And Vomiting  . Metoclopramide Hives  . Naproxen Hives    Pt is able to take ibuprofen without problems  . Latex Rash  . Tramadol Rash    Prescriptions prior to admission  Medication Sig Dispense Refill Last Dose  . ibuprofen (ADVIL,MOTRIN) 200 MG tablet Take 400 mg by mouth every 6 (six) hours as needed for mild pain or moderate pain.   07/12/2015 at Unknown time  . benztropine (COGENTIN) 1 MG tablet Take 1 tablet (1 mg total) by mouth 2 (two) times daily. For prevention of drug induced tremors. (Patient not taking: Reported on 06/06/2015) 60 tablet 0 Not Taking at Unknown time  . hydrOXYzine (ATARAX/VISTARIL) 25 MG tablet Take 1 tablet (25 mg total)  by mouth 3 (three) times daily as needed for anxiety. (Patient not taking: Reported on 06/06/2015) 45 tablet 0 Not Taking at Unknown time  . lamoTRIgine (LAMICTAL) 25 MG tablet Take 1 tablet (25 mg total) by mouth daily. For mood stabilization (Patient not taking: Reported on 06/06/2015) 30 tablet 0 Not Taking at Unknown time  . mirtazapine (REMERON) 7.5 MG tablet Take 1 tablet (7.5 mg total) by mouth at bedtime. For sleep (Patient not taking: Reported on 06/06/2015) 30 tablet 0 Not Taking at Unknown time  . nicotine (NICODERM CQ - DOSED IN MG/24 HOURS) 21 mg/24hr patch Place 1 patch (21 mg total) onto the skin daily. For smoking cessation (Patient not taking: Reported on 06/06/2015) 28 patch 0 Not Taking at Unknown time  . SUBOXONE 8-2 MG FILM Place 1 strip under the tongue twice daily  0 07/12/2015   Recent Results (from the past 2160 hour(s))   Basic metabolic panel     Status: Abnormal   Collection Time: 06/06/15  9:00 AM  Result Value Ref Range   Sodium 140 135 - 145 mmol/L   Potassium 3.4 (L) 3.5 - 5.1 mmol/L   Chloride 105 101 - 111 mmol/L   CO2 26 22 - 32 mmol/L   Glucose, Bld 123 (H) 65 - 99 mg/dL   BUN 7 6 - 20 mg/dL   Creatinine, Ser 0.76 0.44 - 1.00 mg/dL   Calcium 9.0 8.9 - 10.3 mg/dL   GFR calc non Af Amer >60 >60 mL/min   GFR calc Af Amer >60 >60 mL/min    Comment: (NOTE) The eGFR has been calculated using the CKD EPI equation. This calculation has not been validated in all clinical situations. eGFR's persistently <60 mL/min signify possible Chronic Kidney Disease.    Anion gap 9 5 - 15  CBC with Differential/Platelet     Status: None   Collection Time: 06/06/15  9:00 AM  Result Value Ref Range   WBC 7.0 4.0 - 10.5 K/uL   RBC 4.16 3.87 - 5.11 MIL/uL   Hemoglobin 12.5 12.0 - 15.0 g/dL   HCT 36.4 36.0 - 46.0 %   MCV 87.5 78.0 - 100.0 fL   MCH 30.0 26.0 - 34.0 pg   MCHC 34.3 30.0 - 36.0 g/dL   RDW 14.1 11.5 - 15.5 %   Platelets 340 150 - 400 K/uL   Neutrophils Relative % 68 %   Neutro Abs 4.8 1.7 - 7.7 K/uL   Lymphocytes Relative 23 %   Lymphs Abs 1.6 0.7 - 4.0 K/uL   Monocytes Relative 8 %   Monocytes Absolute 0.5 0.1 - 1.0 K/uL   Eosinophils Relative 1 %   Eosinophils Absolute 0.1 0.0 - 0.7 K/uL   Basophils Relative 0 %   Basophils Absolute 0.0 0.0 - 0.1 K/uL  Pregnancy, urine POC     Status: None   Collection Time: 07/13/15  9:00 AM  Result Value Ref Range   Preg Test, Ur NEGATIVE NEGATIVE    Comment:        THE SENSITIVITY OF THIS METHODOLOGY IS >24 mIU/mL   Urinalysis, Routine w reflex microscopic (not at [facility])     Status: Abnormal   Collection Time: 07/13/15  9:01 AM  Result Value Ref Range   Color, Urine YELLOW YELLOW   APPearance CLEAR CLEAR   Specific Gravity, Urine 1.015 1.005 - 1.030   pH 6.0 5.0 - 8.0   Glucose, UA NEGATIVE NEGATIVE mg/dL   Hgb urine dipstick SMALL (A)  NEGATIVE   Bilirubin Urine NEGATIVE NEGATIVE   Ketones, ur NEGATIVE NEGATIVE mg/dL   Protein, ur NEGATIVE NEGATIVE mg/dL   Nitrite NEGATIVE NEGATIVE   Leukocytes, UA TRACE (A) NEGATIVE  Urine rapid drug screen (hosp performed)     Status: Abnormal   Collection Time: 07/13/15  9:01 AM  Result Value Ref Range   Opiates NONE DETECTED NONE DETECTED   Cocaine NONE DETECTED NONE DETECTED   Benzodiazepines NONE DETECTED NONE DETECTED   Amphetamines NONE DETECTED NONE DETECTED   Tetrahydrocannabinol POSITIVE (A) NONE DETECTED   Barbiturates NONE DETECTED NONE DETECTED    Comment:        DRUG SCREEN FOR MEDICAL PURPOSES ONLY.  IF CONFIRMATION IS NEEDED FOR ANY PURPOSE, NOTIFY LAB WITHIN 5 DAYS.        LOWEST DETECTABLE LIMITS FOR URINE DRUG SCREEN Drug Class       Cutoff (ng/mL) Amphetamine      1000 Barbiturate      200 Benzodiazepine   932 Tricyclics       355 Opiates          300 Cocaine          300 THC              50   Urine microscopic-add on     Status: Abnormal   Collection Time: 07/13/15  9:01 AM  Result Value Ref Range   Squamous Epithelial / LPF 0-5 (A) NONE SEEN   WBC, UA 6-30 0 - 5 WBC/hpf   RBC / HPF NONE SEEN 0 - 5 RBC/hpf   Bacteria, UA FEW (A) NONE SEEN   Trichomonas, UA PRESENT   CBC     Status: Abnormal   Collection Time: 07/13/15  9:31 AM  Result Value Ref Range   WBC 16.5 (H) 4.0 - 10.5 K/uL   RBC 4.78 3.87 - 5.11 MIL/uL   Hemoglobin 14.4 12.0 - 15.0 g/dL   HCT 41.5 36.0 - 46.0 %   MCV 86.8 78.0 - 100.0 fL   MCH 30.1 26.0 - 34.0 pg   MCHC 34.7 30.0 - 36.0 g/dL   RDW 15.1 11.5 - 15.5 %   Platelets 413 (H) 150 - 400 K/uL  Comprehensive metabolic panel     Status: Abnormal   Collection Time: 07/13/15  9:31 AM  Result Value Ref Range   Sodium 140 135 - 145 mmol/L   Potassium 4.1 3.5 - 5.1 mmol/L   Chloride 106 101 - 111 mmol/L   CO2 27 22 - 32 mmol/L   Glucose, Bld 116 (H) 65 - 99 mg/dL   BUN 22 (H) 6 - 20 mg/dL   Creatinine, Ser 0.72 0.44 - 1.00  mg/dL   Calcium 8.6 (L) 8.9 - 10.3 mg/dL   Total Protein 7.4 6.5 - 8.1 g/dL   Albumin 4.3 3.5 - 5.0 g/dL   AST 24 15 - 41 U/L   ALT 30 14 - 54 U/L   Alkaline Phosphatase 52 38 - 126 U/L   Total Bilirubin 0.4 0.3 - 1.2 mg/dL   GFR calc non Af Amer >60 >60 mL/min   GFR calc Af Amer >60 >60 mL/min    Comment: (NOTE) The eGFR has been calculated using the CKD EPI equation. This calculation has not been validated in all clinical situations. eGFR's persistently <60 mL/min signify possible Chronic Kidney Disease.    Anion gap 7 5 - 15  Wet prep, genital     Status: Abnormal   Collection  Time: 07/13/15 12:25 PM  Result Value Ref Range   Yeast Wet Prep HPF POC NONE SEEN NONE SEEN   Trich, Wet Prep PRESENT (A) NONE SEEN   Clue Cells Wet Prep HPF POC PRESENT (A) NONE SEEN   WBC, Wet Prep HPF POC MANY (A) NONE SEEN    Comment: MANY BACTERIA SEEN   Sperm NONE SEEN     Review of Systems  Constitutional: Negative for fever.  Gastrointestinal: Positive for nausea and vomiting.   Physical Exam   Blood pressure 107/66, pulse 61, temperature 98.4 F (36.9 C), temperature source Oral, resp. rate 16, last menstrual period 05/15/2015, SpO2 100 %, unknown if currently breastfeeding.  Physical Exam  Constitutional: She is oriented to person, place, and time. She appears well-developed and well-nourished. No distress.  HENT:  Head: Normocephalic.  Eyes: Pupils are equal, round, and reactive to light.  Neck: Neck supple.  Respiratory: Effort normal.  GI: Soft. Normal appearance. There is tenderness in the right lower quadrant, periumbilical area, suprapubic area and left lower quadrant. There is rigidity. There is no rebound and no guarding.  Genitourinary:  Speculum exam: Vagina - Small amount of creamy, yellow discharge, no odor Cervix - No contact bleeding Bimanual exam: Cervix closed, +CMT  Uterus non tender, normal size Adnexa non tender, no masses bilaterally GC/Chlam, wet prep  done Chaperone present for exam.  Musculoskeletal: Normal range of motion.  Neurological: She is alert and oriented to person, place, and time.  Skin: She is not diaphoretic.    MAU Course  Procedures  None  MDM  Zofran 4 mg IV Patient requests pain medication.  Ibuprofen 800 mgPO Zofran 4 mg IV  Wet prep GC HIV   Patient without fever> pain down with ibuprofen> nausea and vomiting subsided with Zofran> patient given Azithromycin (allergic to doxy)> Flagyl 2 grams>rocephin 250 mg IM.   Assessment and Plan   A:  1. PID (acute pelvic inflammatory disease)   2. Non-intractable vomiting with nausea, vomiting of unspecified type   3. Lower abdominal pain   4. Trichomonas vaginitis     P:  Discharge home in stable condition RX: Azithromycin 1000 mg to take in 1 week as a second dose. Zofran. Condoms always Partner needs treatment.  Return to MAU if symptoms worsen, if patient develops fever.   Lezlie Lye, NP 07/13/2015 10:30 AM

## 2015-07-13 NOTE — MAU Note (Cosign Needed)
  History     CSN: [id number]  Arrival date and time: 05/07/13 [id number]   First Provider Initiated Contact with Patient 05/07/13 1017     Ms. [name] is a 30 yo [id number] who c/o nausea, vomiting, abdominal pain and back pain for the last 24 hours.  Chief Complaint  Patient presents with  . Abdominal Pain   HPI Patient reports nausea and vomiting that began last night. The vomiting has continued until arrival at the MAU this morning. Pt also reports abdominal and back pain that began at a similar time. Pt denies diarrhea, sore throat, fever, or cough. Pt endorses chills and headaches. Pt asked if she had ever experienced withdrawal symptoms, she reported yes and that this was different. Patient has not had burning with urination and reports regular bowel movements (last one today). Pt denies blood in vomit.  Last menstrual period was mid-January, and she reports otherwise regular periods.  Past Medical History  Diagnosis Date  . Fibroid   Endometriosis  Past Surgical History  Procedure Laterality Date  . Breast surgery      left side had cyst, non cancerous  . Oophorectomy      unsure of which side, had cyst and needed ovary removed    Family History  Problem Relation Age of Onset  . Cancer Sister [id number]    breast  . Cancer Paternal Grandmother     breast    History  Substance Use Topics  . Smoking status: Current Every Day Smoker -- 0.50 packs/day for 25 years    Types: Cigarettes  . Smokeless tobacco: Never Used  . Alcohol Use: No  Opioid and cocaine abuse. Last usage of heroin unknown, pt is taking suboxone for dependence currently (last dose within 24 hrs). Last cocaine use was 1.5 weeks ago.  Allergies: No Known Allergies  Patient reports Suboxone for heroin dependence.    ROS Physical Exam   Blood pressure 150/67, pulse 81, temperature 98.5 F (36.9 C), temperature source Oral, resp. rate 18, height 5\' 4"  (1.626 m), weight 79.198 kg (174 lb 9.6 oz), last menstrual  period 01/11/2013.  Gen: ill-appearing, laying on bed in fetal position. Vomiting while in room. CV: RRR no m/r/g Lungs: CTAB Abd: Pt vomiting while in room, vomit appeared greenish without blood. Diffuse, mild abdominal tenderness present, some rebound tenderness in bilateral lower abdomen. Bowel sounds present. GU: CVA tenderness absent. Speculum/bimanual exam: - Cervix: non-friable - White-yellow discharge noted - cervical motion tenderness present  MAU Course  Procedures    Assessment and Plan  Ms. [name] is a 30 yo female c/o nausea, vomiting, abdominal pain and back pain for 24 hrs concerning for PID.  N/V: Start IV saline with 4 mg Zofran, monitor for 1 hour. Pt given another 4mg  dose of Zofran. Vitals stable throughout visit. D/c once Zofran administered and patient is stabilized. Pregnancy test negative.  Abd pain: not concerning for acute abdomen, trichomonas present on UA. Speculum exam revealed white-yellow discharge positive for CHLAMYDIA/GC??Marland Kitchen. Tx with one-time 2g dose of metronidazole, treat partner as well. PID tx 250 mg ceftriaxone IM one time and 100 mg doxycycline bid for 14 days. Back pain: UA showed few bacteria, unlikely due to UTI. UA showed trichomonas, see above for work up and Tx. Pt denies CVA tenderness or burning with urination. PID suspected.    Thomasena EdisJohn N Mohamadou Maciver, MS3 07/13/15 10:46

## 2015-07-13 NOTE — Discharge Instructions (Signed)
Trichomonas Test The trichomonas test is done to diagnose trichomoniasis, an infection caused by an organism called Trichomonas. Trichomoniasis is a sexually transmitted infection (STI). In women, it causes vaginal infections. In men, it can cause the tube that carries urine (urethra) to become inflamed (urethritis). You may have this test as a part of a routine screening for STIs or if you have symptoms of trichomoniasis. To perform the test, your health care provider will take a sample of discharge. The sample is taken from the vagina or cervix in women and from the urethra in men. A urine sample can also be used for testing. RESULTS It is your responsibility to obtain your test results. Ask the lab or department performing the test when and how you will get your results. Contact your health care provider to discuss any questions you have about your results.  Meaning of Negative Test Results A negative test means you do not have trichomoniasis. Follow your health care provider's directions about any follow-up testing.  Meaning of Positive Test Results A positive test result means you have an active infection that needs to be treated with antibiotic medicine. All your current sexual partners must also be treated or it is likely you will get reinfected.  If your test is positive, your health care provider will start you on medicine and may advise you to:  Not have sexual intercourse until your infection has cleared up.  Use a latex condom properly every time you have sexual intercourse.  Limit the number of sexual partners you have. The more partners you have, the greater your risk of contracting trichomoniasis or another STI.  Tell all sexual partners about your infection so they can also be treated and to prevent reinfection.   This information is not intended to replace advice given to you by your health care provider. Make sure you discuss any questions you have with your health care  provider.   Document Released: 05/14/2004 Document Revised: 05/02/2014 Document Reviewed: 04/23/2013 Elsevier Interactive Patient Education 2016 Else[employer] Inc[employer] Pelvic Inflammatory Disease Pelvic inflammatory disease (PID) refers to an infection in some or all of the female organs. The infection can be in the uterus, ovaries, fallopian tubes, or the surrounding tissues in the pelvis. PID can cause abdominal or pelvic pain that comes on suddenly (acute pelvic pain). PID is a serious infection because it can lead to lasting (chronic) pelvic pain or the inability to have children (infertility). CAUSES This condition is most often caused by an infection that is spread during sexual contact. However, the infection can also be caused by the normal bacteria that are found in the vaginal tissues if these bacteria travel upward into the reproductive organs. PID can also occur following:  The birth of a baby.  A miscarriage.  An abortion.  Major pelvic surgery.  The use of an intrauterine device (IUD).  A sexual assault. RISK FACTORS This condition is more likely to develop in women who:  Are younger than 31 years of age.  Are sexually active at [facility] age.  Use nonbarrier contraception.  Have multiple sexual partners.  Have sex with someone who has symptoms of an STD (sexually transmitted disease).  Use oral contraception. At times, certain behaviors can also increase the possibility of getting PID, such as:  Using a vaginal douche.  Having an IUD in place. SYMPTOMS Symptoms of this condition include:  Abdominal or pelvic pain.  Fever.  Chills.  Abnormal vaginal discharge.  Abnormal uterine bleeding.  Unusual pain  shortly after the end of a menstrual period.  Painful urination.  Pain with sexual intercourse.  Nausea and vomiting. DIAGNOSIS To diagnose this condition, your health care provider will do a physical exam and take your medical history. A pelvic exam  typically reveals great tenderness in the uterus and the surrounding pelvic tissues. You may also have tests, such as:  Lab tests, including a pregnancy test, blood tests, and urine test.  Culture tests of the vagina and cervix to check for an STD.  Ultrasound.  A laparoscopic procedure to look inside the pelvis.  Examining vaginal secretions under a microscope. TREATMENT Treatment for this condition may involve one or more approaches.  Antibiotic medicines may be prescribed to be taken by mouth.  Sexual partners may need to be treated if the infection is caused by an STD.  For more severe cases, hospitalization may be needed to give antibiotics directly into a vein through an IV tube.  Surgery may be needed if other treatments do not help, but this is rare. It may take weeks until you are completely well. If you are diagnosed with PID, you should also be checked for human immunodeficiency virus (HIV). Your health care provider may test you for infection again 3 months after treatment. You should not have unprotected sex. HOME CARE INSTRUCTIONS  Take over-the-counter and prescription medicines only as told by your health care provider.  If you were prescribed an antibiotic medicine, take it as told by your health care provider. Do not stop taking the antibiotic even if you start to feel better.  Do not have sexual intercourse until treatment is completed or as told by your health care provider. If PID is confirmed, your recent sexual partners will need treatment, especially if you had unprotected sex.  Keep all follow-up visits as told by your health care provider. This is important. SEEK MEDICAL CARE IF:  You have increased or abnormal vaginal discharge.  Your pain does not improve.  You vomit.  You have a fever.  You cannot tolerate your medicines.  Your partner has an STD.  You have pain when you urinate. SEEK IMMEDIATE MEDICAL CARE IF:  You have increased abdominal  or pelvic pain.  You have chills.  Your symptoms are not better in 72 hours even with treatment.   This information is not intended to replace advice given to you by your health care provider. Make sure you discuss any questions you have with your health care provider.   Document Released: 04/11/2005 Document Revised: 12/31/2014 Document Reviewed: 05/19/2014 Elsevier Interactive Patient Education [employer]2016 [employer] Inc[employer]

## 2015-07-13 NOTE — MAU Note (Signed)
Poss preg, no period in 2 months.  Actively vomiting on arrival.  Having pain in abd and back.  Reports some bleeding yesterday.

## 2015-07-14 LAB — GC/CHLAMYDIA PROBE AMP (~~LOC~~) NOT AT ARMC
Chlamydia: POSITIVE — AB
Neisseria Gonorrhea: POSITIVE — AB

## 2015-07-14 LAB — HIV ANTIBODY (ROUTINE TESTING W REFLEX): HIV Screen 4th Generation wRfx: NONREACTIVE

## 2015-07-20 ENCOUNTER — Encounter (HOSPITAL_COMMUNITY): Payer: Self-pay | Admitting: *Deleted

## 2015-08-23 ENCOUNTER — Encounter (HOSPITAL_COMMUNITY): Payer: Self-pay | Admitting: *Deleted

## 2015-08-23 ENCOUNTER — Emergency Department (HOSPITAL_COMMUNITY)
Admission: EM | Admit: 2015-08-23 | Discharge: 2015-08-23 | Disposition: A | Discharge status: Home / Self Care | Payer: Medicaid Other | Attending: Emergency Medicine | Admitting: Emergency Medicine

## 2015-08-23 DIAGNOSIS — Z791 Long term (current) use of non-steroidal anti-inflammatories (NSAID): Secondary | ICD-10-CM | POA: Diagnosis not present

## 2015-08-23 DIAGNOSIS — Z91040 Latex allergy status: Secondary | ICD-10-CM | POA: Diagnosis not present

## 2015-08-23 DIAGNOSIS — H409 Unspecified glaucoma: Secondary | ICD-10-CM | POA: Insufficient documentation

## 2015-08-23 DIAGNOSIS — Z792 Long term (current) use of antibiotics: Secondary | ICD-10-CM | POA: Diagnosis not present

## 2015-08-23 DIAGNOSIS — F17200 Nicotine dependence, unspecified, uncomplicated: Secondary | ICD-10-CM | POA: Diagnosis not present

## 2015-08-23 DIAGNOSIS — F1414 Cocaine abuse with cocaine-induced mood disorder: Secondary | ICD-10-CM | POA: Insufficient documentation

## 2015-08-23 DIAGNOSIS — R109 Unspecified abdominal pain: Secondary | ICD-10-CM | POA: Insufficient documentation

## 2015-08-23 DIAGNOSIS — F1410 Cocaine abuse, uncomplicated: Secondary | ICD-10-CM | POA: Diagnosis present

## 2015-08-23 DIAGNOSIS — F1494 Cocaine use, unspecified with cocaine-induced mood disorder: Secondary | ICD-10-CM

## 2015-08-23 DIAGNOSIS — Z79899 Other long term (current) drug therapy: Secondary | ICD-10-CM | POA: Diagnosis not present

## 2015-08-23 LAB — CBC
HCT: 38.9 % (ref 36.0–46.0)
Hemoglobin: 13.8 g/dL (ref 12.0–15.0)
MCH: 30.2 pg (ref 26.0–34.0)
MCHC: 35.5 g/dL (ref 30.0–36.0)
MCV: 85.1 fL (ref 78.0–100.0)
PLATELETS: 464 10*3/uL — AB (ref 150–400)
RBC: 4.57 MIL/uL (ref 3.87–5.11)
RDW: 13.8 % (ref 11.5–15.5)
WBC: 10.6 10*3/uL — AB (ref 4.0–10.5)

## 2015-08-23 LAB — COMPREHENSIVE METABOLIC PANEL
ALK PHOS: 52 U/L (ref 38–126)
ALT: 19 U/L (ref 14–54)
ANION GAP: 8 (ref 5–15)
AST: 20 U/L (ref 15–41)
Albumin: 4.9 g/dL (ref 3.5–5.0)
BUN: 20 mg/dL (ref 6–20)
CALCIUM: 9.7 mg/dL (ref 8.9–10.3)
CHLORIDE: 101 mmol/L (ref 101–111)
CO2: 26 mmol/L (ref 22–32)
CREATININE: 0.9 mg/dL (ref 0.44–1.00)
Glucose, Bld: 82 mg/dL (ref 65–99)
Potassium: 4.1 mmol/L (ref 3.5–5.1)
Sodium: 135 mmol/L (ref 135–145)
Total Bilirubin: 0.9 mg/dL (ref 0.3–1.2)
Total Protein: 8.3 g/dL — ABNORMAL HIGH (ref 6.5–8.1)

## 2015-08-23 LAB — URINALYSIS, ROUTINE W REFLEX MICROSCOPIC
BILIRUBIN URINE: NEGATIVE
GLUCOSE, UA: NEGATIVE mg/dL
KETONES UR: NEGATIVE mg/dL
LEUKOCYTES UA: NEGATIVE
NITRITE: NEGATIVE
PROTEIN: 30 mg/dL — AB
Specific Gravity, Urine: 1.02 (ref 1.005–1.030)
pH: 5.5 (ref 5.0–8.0)

## 2015-08-23 LAB — URINE MICROSCOPIC-ADD ON

## 2015-08-23 LAB — ETHANOL: Alcohol, Ethyl (B): <5 mg/dL (ref <5)

## 2015-08-23 LAB — LIPASE, BLOOD: LIPASE: 30 U/L (ref 11–51)

## 2015-08-23 MED ORDER — LORAZEPAM 2 MG/ML IJ SOLN
1.0000 mg | Freq: Once | INTRAMUSCULAR | Status: AC
  Administered 2015-08-23: 1 mg via INTRAMUSCULAR

## 2015-08-23 MED ORDER — LORAZEPAM 2 MG/ML IJ SOLN
1.0000 mg | Freq: Once | INTRAMUSCULAR | Status: DC
  Filled 2015-08-23: qty 1

## 2015-08-23 MED ORDER — TRAZODONE HCL 50 MG PO TABS: 50.0000 mg | ORAL_TABLET | Freq: Every day | ORAL | Status: DC

## 2015-08-23 MED ORDER — LAMOTRIGINE 25 MG PO TABS: 25.0000 mg | ORAL_TABLET | Freq: Every day | ORAL | Status: DC

## 2015-08-23 MED ORDER — HALOPERIDOL LACTATE 5 MG/ML IJ SOLN
5.0000 mg | Freq: Once | INTRAMUSCULAR | Status: AC
  Administered 2015-08-23: 5 mg via INTRAMUSCULAR
  Filled 2015-08-23: qty 1

## 2015-08-23 NOTE — ED Provider Notes (Signed)
CSN: [id number]     Arrival date & time 08/23/15  1038 History   First MD Initiated Contact with Patient 08/23/15 1332     Chief Complaint  Patient presents with  . crack abuse   . Abdominal Pain  . Medical Clearance    HPI Pt has a history of using crack cocaine.  She has been using for the last 4 days straight.  She thinks she is hearing voices and that people are out to get her.  Pt is aching all over her body.  She had abdominal pain earlier. She had been on suboxone in the past but has not been on it for a while. She recently got out of jail.  She thinks she might be withdrawing.  Pt is upset and is worried people are talking about her.  She denies any thoughts of SI or HI. Past Medical History  Diagnosis Date  . STD (female)     hx of chlamydia and gonorrhea  . History of PID   . Endometriosis   . Glaucoma   . Ovarian cyst   . Bartholin cyst   . Headache(784.0)   . Infection     UTI  . Vaginal Pap smear, abnormal     has not followed up  . Substance abuse    Past Surgical History  Procedure Laterality Date  . Fracture surgery      left leg  . Dilation and curettage of uterus    . Laparoscopy     Family History  Problem Relation Age of Onset  . Anesthesia problems Neg Hx   . Hypotension Neg Hx   . Malignant hyperthermia Neg Hx   . Pseudochol deficiency Neg Hx   . Alcohol abuse Neg Hx   . Diabetes Mother   . Hypertension Maternal Grandmother   . Heart disease Maternal Grandmother     great grandma   Social History  Substance Use Topics  . Smoking status: Current Every Day Smoker -- 0.25 packs/day for 7 years  . Smokeless tobacco: Never Used  . Alcohol Use: No     Comment: Denies ETOH use   OB History    Gravida Para Term Preterm AB TAB SAB Ectopic Multiple Living   0 2 0 0 2     Review of Systems  Neurological:       Intermittent myoclonic spasms without LOC  All other systems reviewed and are negative.     Allergies  Acyclovir and  related; Darvocet; Doxycycline; Flexeril; Metoclopramide; Naproxen; Latex; and Tramadol  Home Medications   Prior to Admission medications   Medication Sig Start Date End Date Taking? Authorizing Provider  acetaminophen (TYLENOL) 500 MG tablet Take 1,000 mg by mouth every 6 (six) hours as needed for moderate pain.   Yes Historical Provider, MD  ibuprofen (ADVIL,MOTRIN) 200 MG tablet Take 400 mg by mouth every 6 (six) hours as needed for mild pain or moderate pain.   Yes Historical Provider, MD  SUBOXONE 8-2 MG FILM Place 1 strip under the tongue three times daily. 05/29/15  Yes Historical Provider, MD  azithromycin (ZITHROMAX) 500 MG tablet Take 2 tablets (1,000 mg total) by mouth once. Take first 2 tablets together, then 1 every day until finished. 07/20/15   Duane Lope, NP  benztropine (COGENTIN) 1 MG tablet Take 1 tablet (1 mg total) by mouth 2 (two) times daily. For prevention of drug induced tremors. Patient not taking: Reported on 06/06/2015 03/30/15  Sanjuana KavaAgnes I Nwoko, NP  hydrOXYzine (ATARAX/VISTARIL) 25 MG tablet Take 1 tablet (25 mg total) by mouth 3 (three) times daily as needed for anxiety. Patient not taking: Reported on 06/06/2015 03/30/15   Sanjuana KavaAgnes I Nwoko, NP  lamoTRIgine (LAMICTAL) 25 MG tablet Take 1 tablet (25 mg total) by mouth daily. For mood stabilization 08/23/15   Linwood DibblesJon Allis Quirarte, MD  mirtazapine (REMERON) 7.5 MG tablet Take 1 tablet (7.5 mg total) by mouth at bedtime. For sleep Patient not taking: Reported on 06/06/2015 03/30/15   Sanjuana KavaAgnes I Nwoko, NP  nicotine (NICODERM CQ - DOSED IN MG/24 HOURS) 21 mg/24hr patch Place 1 patch (21 mg total) onto the skin daily. For smoking cessation Patient not taking: Reported on 06/06/2015 03/30/15   Sanjuana KavaAgnes I Nwoko, NP  ondansetron (ZOFRAN ODT) 4 MG disintegrating tablet Take 1 tablet (4 mg total) by mouth every 8 (eight) hours as needed for nausea or vomiting. Patient not taking: Reported on 08/23/2015 07/13/15   Duane LopeJennifer I Rasch, NP  traZODone (DESYREL)  50 MG tablet Take 1 tablet (50 mg total) by mouth at bedtime. 08/23/15   Linwood DibblesJon Monicia Tse, MD   BP 114/78 mmHg  Pulse 77  Temp(Src) 98.3 F (36.8 C) (Oral)  Resp 17  SpO2 99% Physical Exam  Constitutional: She appears well-developed and well-nourished. No distress.  HENT:  Head: Normocephalic and atraumatic.  Right Ear: External ear normal.  Left Ear: External ear normal.  Eyes: Conjunctivae are normal. Right eye exhibits no discharge. Left eye exhibits no discharge. No scleral icterus.  Neck: Neck supple. No tracheal deviation present.  Cardiovascular: Normal rate, regular rhythm and intact distal pulses.   Pulmonary/Chest: Effort normal and breath sounds normal. No stridor. No respiratory distress. She has no wheezes. She has no rales.  Abdominal: Soft. Bowel sounds are normal. She exhibits no distension. There is no tenderness. There is no rebound and no guarding.  Musculoskeletal: She exhibits no edema or tenderness.  Neurological: She is alert. She has normal strength. No cranial nerve deficit (no facial droop, extraocular movements intact, no slurred speech) or sensory deficit. She exhibits normal muscle tone. She displays no seizure activity. Coordination normal.  Skin: Skin is warm and dry. No rash noted.  Psychiatric: Her mood appears anxious. Her affect is labile. She is hyperactive. She is not combative. She expresses impulsivity. She expresses no homicidal and no suicidal ideation. She expresses no suicidal plans and no homicidal plans.  tearful  Nursing note and vitals reviewed.   ED Course  Procedures (including critical care time) Labs Review Labs Reviewed  COMPREHENSIVE METABOLIC PANEL - Abnormal; Notable for the following:    Total Protein 8.3 (*)    All other components within normal limits  CBC - Abnormal; Notable for the following:    WBC 10.6 (*)    Platelets 464 (*)    All other components within normal limits  URINALYSIS, ROUTINE W REFLEX MICROSCOPIC (NOT AT [facility])  - Abnormal; Notable for the following:    Hgb urine dipstick MODERATE (*)    Protein, ur 30 (*)    All other components within normal limits  URINE MICROSCOPIC-ADD ON - Abnormal; Notable for the following:    Squamous Epithelial / LPF 0-5 (*)    Bacteria, UA FEW (*)    Casts HYALINE CASTS (*)    All other components within normal limits  LIPASE, BLOOD  ETHANOL    I have personally reviewed and evaluated these images and lab results as part of my medical  decision-making.    MDM   Final diagnoses:  Cocaine-induced mood disorder (HCC)    I reviewed prior records.  Pt has had similar episodes with crack use in the past.  She is not suicidal but is agitated and upset.  Not responding to internal stimuli while I was in the room with her.  Will give her a dose of ativan and haldol which has helped in the past.  1619  Pt is calmer and feeling better.  She has been able to eat and sleep here in the ED.  Counseled against cocaine use.  Refill her psych med.  Outpatient resources for substance abuse treatment.    Linwood Dibbles, MD 08/23/15 [phone]

## 2015-08-23 NOTE — ED Notes (Signed)
Pt provided with Mal[place] sandwich, cheese stick, oj and sprite per pt reuqest. Pt is A&O. Pt continues to jerk and shake while sitting up in bed. Pt tearful sts " I don't know what happening." Pt reassured that she is safe

## 2015-08-23 NOTE — ED Notes (Signed)
Pt sleeping quielty

## 2015-08-23 NOTE — ED Notes (Signed)
Tim, RN attempted to wake pt to d/c. Pt would not awaken. Pt vss. Will continue to monitor

## 2015-08-23 NOTE — ED Notes (Signed)
Pt called EMS for Abd pain after smoking Crack Cocaine, wants to be place Suboxone, No noted Si/HI. Self mediactes but unclear about what she is talking about

## 2015-08-23 NOTE — ED Notes (Signed)
RN could not get vitals at this time.

## 2015-08-23 NOTE — Discharge Instructions (Signed)
Stimulant Use Disorder-Cocaine °Cocaine is one of a group of powerful drugs called stimulants. Cocaine has medical uses for stopping nosebleeds and for pain control before minor nose or dental surgery. However, cocaine is misused because of the effects that it produces. These effects include:  °· A feeling of extreme pleasure. °· Alertness. °· High energy. °Common street names for cocaine include coke, crack, blow, snow, and nose candy. Cocaine is snorted, dissolved in water and injected, or smoked.  °Stimulants are addictive because they activate regions of the brain that produce both the pleasurable sensation of "reward" and psychological dependence. Together, these actions account for loss of control and the rapid development of drug dependence. This means you become ill without the drug (withdrawal) and need to keep using it to function.  °Stimulant use disorder is use of stimulants that disrupts your daily life. It disrupts relationships with family and friends and how you do your job. Cocaine increases your blood pressure and heart rate. It can cause a heart attack or stroke. Cocaine can also cause death from irregular heart rate or seizures. °SYMPTOMS °Symptoms of stimulant use disorder with cocaine include: °· Use of cocaine in larger amounts or over a longer period of time than intended. °· Unsuccessful attempts to cut down or control cocaine use. °· A lot of time spent obtaining, using, or recovering from the effects of cocaine. °· A strong desire or urge to use cocaine (craving). °· Continued use of cocaine in spite of major problems at work, school, or home because of use. °· Continued use of cocaine in spite of relationship problems because of use. °· Giving up or cutting down on important life activities because of cocaine use. °· Use of cocaine over and over in situations when it is physically hazardous, such as driving a car. °· Continued use of cocaine in spite of a physical problem that is likely  related to use. Physical problems can include: °¨ Malnutrition. °¨ Nosebleeds. °¨ Chest pain. °¨ High blood pressure. °¨ A hole that develops between the part of your nose that separates your nostrils (perforated nasal septum). °¨ Lung and kidney damage. °· Continued use of cocaine in spite of a mental problem that is likely related to use. Mental problems can include: °¨ Schizophrenia-like symptoms. °¨ Depression. °¨ Bipolar mood swings. °¨ Anxiety. °¨ Sleep problems. °· Need to use more and more cocaine to get the same effect, or lessened effect over time with use of the same amount of cocaine (tolerance). °· Having withdrawal symptoms when cocaine use is stopped, or using cocaine to reduce or avoid withdrawal symptoms. Withdrawal symptoms include: °¨ Depressed or irritable mood. °¨ Low energy or restlessness. °¨ Bad dreams. °¨ Poor or excessive sleep. °¨ Increased appetite. °DIAGNOSIS °Stimulant use disorder is diagnosed by your health care provider. You may be asked questions about your cocaine use and how it affects your life. A physical exam may be done. A drug screen may be ordered. You may be referred to a mental health professional. The diagnosis of stimulant use disorder requires at least two symptoms within 12 months. The type of stimulant use disorder depends on the number of signs and symptoms you have. The type may be: °· Mild. Two or three signs and symptoms. °· Moderate. Four or five signs and symptoms. °· Severe. Six or more signs and symptoms. °TREATMENT °Treatment for stimulant use disorder is usually provided by mental health professionals with training in substance use disorders. The following options are available: °·   Counseling or talk therapy. Talk therapy addresses the reasons you use cocaine and ways to keep you from using again. Goals of talk therapy include: °¨ Identifying and avoiding triggers for use. °¨ Handling cravings. °¨ Replacing use with healthy activities. °· Support groups.  Support groups provide emotional support, advice, and guidance. °· Medicine. Certain medicines may decrease cocaine cravings or withdrawal symptoms. °HOME CARE INSTRUCTIONS °· Take medicines only as directed by your health care provider. °· Identify the people and activities that trigger your cocaine use and avoid them. °· Keep all follow-up visits as directed by your health care provider. °SEEK MEDICAL CARE IF: °· Your symptoms get worse or you relapse. °· You are not able to take medicines as directed. °SEEK IMMEDIATE MEDICAL CARE IF: °· You have serious thoughts about hurting yourself or others. °· You have a seizure, chest pain, sudden weakness, or loss of speech or vision. °FOR MORE INFORMATION °· National Institute on Drug Abuse: www.drugabuse.gov °· Substance Abuse and Mental Health Services Administration: www.samhsa.gov °  °This information is not intended to replace advice given to you by your health care provider. Make sure you discuss any questions you have with your health care provider. °  °Document Released: 04/08/2000 Document Revised: 05/02/2014 Document Reviewed: 04/24/2013 °Elsevier Interactive Patient Education ©2016 Elsevier Inc. °Community Resource Guide Outpatient Counseling/Substance Abuse Adult °The United Way’s “211” is a great source of information about community services available.  Access by dialing 2-1-1 from anywhere in [place], or by website -  www.nc211.org.  ° °Other Local Resources (Updated 04/2015) ° °Crisis Hotlines °  °Services  ° °  °Area Served  °[facility] • Crisis Hotline, available 24 hours a day, 7 days a week: 800-939-5911 [place], [place]  ° [facility] • Crisis Hotline, available 24 hours a day, 7 days a week: 866-275-9552 [place], [place]  °[facility] • Suicide Prevention Hotline, available 24 hours a day, 7 days a week: 800-273-8255 [place], [place]  °[facility] ° • Crisis Hotline, available 24 hours a day, 7  days a week: 336-676-6840 [place], [place] °  °[facility] • Crisis Hotline, available 24 hours a day, 7 days a week: 800-256-2452 All °  °[facility] • Crisis Hotline, available 24 hours a day, 7 days a week: 877-626-1772 All  ° °Other Local Resources (Updated 04/2015) ° °Outpatient Counseling/ Substance Abuse Programs  °Services  ° °  °Address and Phone Number  °[facility] ° • Options include Individual counseling, group counseling, intensive outpatient program (several hours a day, several days a week) °• Offers depression assessments °• Provides methadone maintenance program 336-333-6860 °[address], [address] °[place], [place] [address] °  °[facility] ° • Offers partial hospitalization/day treatment and DUI/DWI programs °• Accepts Medicare, private insurance [phone] °[address] °[place], [place] [address]  °[facility] ° ° • Services include intensive outpatient program (several hours a day, several days a week), outpatient treatment, DUI/DWI services, family education °• Also has some services specifically for Veterans °• Offers transitional housing  [phone] °[address] °[place], [place] [address] °  °  °[facility] • Accepts Medicare, private pay, and private insurance [phone] °[address] °[place],  [address]  °[facility] • Services include individual counseling, substance abuse intensive outpatient program (several hours a day, several days a week), day treatment °• Accepts Medicare, Medicaid, private insurance [phone] °[address] °[place], [place] [address]  °[facility] ° • Offers substance abuse intensive outpatient program (several hours a day, several days a week), partial hospitalization program 336-832-9800 °[address] °[place], [place] [address] ° °[phone] °[address] °[place], [place] [address] ° °[phone] °[address] °[place], [place] [address] ° °[phone] °[address] °[place], [place] [address]  °[facility] • Individual counseling only °• Accepts private insurance only [phone] °[address] °[place], [place] [address]  °[facility] • Methadone maintenance program 800-805-6989 °[address] °[place], [place] [address]  °[facility] • Walk-In Clinic providing substance abuse and mental health counseling °• Accepts Medicaid, Medicare, private insurance °• Offers sliding scale for uninsured 336-342-8316 °[address] °[place], [place]   °[facility] • Offers individual counseling, and intensive in-home services 336-347-7415 °[address] °[place], [place] [address]  °[facility] • Offers individual counseling, family counseling, group therapy, domestic violence counseling, consumer credit counseling °• Accepts Medicare, Medicaid, private insurance °• Offers sliding scale for uninsured 336-387-6161 °[address] °[place], [place] [address] ° °336-889-6161 °[place], [address] °[place], [place] [address]  °[facility] • Offers individual, family and group counseling °• 3 locations - [place], [place], and [place] ° 336-899-8800 ° °[address] °[place], [place] [address] ° °[address] °[place], [place] [address] ° °[address] °[place], [place] [address]  °[facility]  ° • Offers psychiatric assessment, 8-week Intensive Outpatient Program (several hours a day, several times a week, daytime or evenings), early recovery group, family Program, medication management °• Private pay or private insurance only [phone], or  °800-659-3381 °[address] °[place], [place] [address]  °[facility] • Offers individual, couples and family counseling °• Accepts Medicaid, private insurance, and sliding scale for uninsured  336-542-2076 °[address] °[place], [place] [address]  °David Fuller, MD • Individual counseling °• Private insurance [phone] °[address] °[place], [place] [address]  °[facility] ° • Offers assessment, substance abuse treatment, and behavioral health treatment [phone] °[address] °[place], [place] [address]  °[facility] • Individual counseling °• Accepts private insurance 336-272-1972 °[address] °[place], [place] [address]  °[facility] • Individual counseling °• Accepts Medicare, private insurance [phone] °[address] °[place], [place] [address]  °[facility]  ° • Offers intensive outpatient program (several hours a day, several times a week) °• Private pay, private insurance [phone] °[address] °[place], [place]  °[facility] • Individual counseling °• Medicare, private insurance [phone] °[address] °[place], [place] [address]  °[facility]  ° • Offers intensive outpatient program (several hours a day, several times a week) and partial hospitalization program 336-794-3550 °[address] °[place], [place] [address]  °Parrish McKinney, MD • Individual counseling 336-282-1251 °[address] °[place], [place] [address]  °[facility] • Offers Christian counseling to individuals, couples, and families °• Accepts Medicare and private insurance; offers sliding scale for uninsured 336-288-1484 °[address] °[place], [place] [address]  °[facility] • Christian counseling 336-542-2060 °[address] °[place], [place] [address]  °[facility] ° • Offers crisis counseling, individual counseling, group therapy, in-home therapy, domestic violence services, day treatment, DWI services, Community Support Team (CST), Assertive Community Treatment Team (ACTT),   substance  abuse Intensive Outpatient Program (several hours a day, several times a week) °• 2 locations - [place] and [place] [phone] °[address] °[place], [place] [address] ° °336-694-1777 °[address] °[place], [place] [address]  °[facility]  ° ° • Individual counseling and group therapy °• Accepts private insurance, Medicare, Medicaid [phone] °[address] °[place], [place]  °[facility] • Offers individual and family counseling °• Offers LGBTQ services °• Accepts private insurance and private pay 336-288-9190 °[address] °[place], [place] [address]  °[facility]  ° • Offers individual counseling, group therapy, and outpatient detox °• Accepts private insurance [phone] °[address] °[place], [place]  °[facility] • Individual counseling °• Accepts Medicare, private insurance [phone] °[address] °[place], [place] [address]  °[facility] • Individual counseling °• Accepts Medicare, private insurance [phone] °[address] °,  [address]  °[facility] ° • Offers substance abuse Intensive Outpatient Program (several hours a day, several times a week) [phone], or °[phone] °[place],   ° °

## 2015-08-24 ENCOUNTER — Emergency Department (HOSPITAL_COMMUNITY)
Admission: EM | Admit: 2015-08-24 | Discharge: 2015-08-24 | Discharge status: Against Medical Advice | Payer: Medicaid Other

## 2015-08-24 NOTE — ED Notes (Signed)
Pt does not want to check in once arrived to the ER via EMS; pt denies SI / HI and states "I am feeling better"; pt gets off of the EMS stretcher and walks out the ER doors

## 2015-10-01 ENCOUNTER — Emergency Department (HOSPITAL_COMMUNITY)
Admission: EM | Admit: 2015-10-01 | Discharge: 2015-10-01 | Disposition: A | Discharge status: Home / Self Care | Payer: Medicaid Other | Attending: Emergency Medicine | Admitting: Emergency Medicine

## 2015-10-01 ENCOUNTER — Encounter (HOSPITAL_COMMUNITY): Payer: Self-pay | Admitting: Emergency Medicine

## 2015-10-01 DIAGNOSIS — F17200 Nicotine dependence, unspecified, uncomplicated: Secondary | ICD-10-CM | POA: Diagnosis not present

## 2015-10-01 DIAGNOSIS — Z91040 Latex allergy status: Secondary | ICD-10-CM | POA: Diagnosis not present

## 2015-10-01 DIAGNOSIS — K0889 Other specified disorders of teeth and supporting structures: Secondary | ICD-10-CM

## 2015-10-01 DIAGNOSIS — K029 Dental caries, unspecified: Secondary | ICD-10-CM | POA: Diagnosis not present

## 2015-10-01 DIAGNOSIS — Z79899 Other long term (current) drug therapy: Secondary | ICD-10-CM | POA: Diagnosis not present

## 2015-10-01 MED ORDER — PENICILLIN V POTASSIUM 500 MG PO TABS
500.0000 mg | ORAL_TABLET | Freq: Once | ORAL | Status: AC
  Administered 2015-10-01: 500 mg via ORAL
  Filled 2015-10-01: qty 1

## 2015-10-01 MED ORDER — PENICILLIN V POTASSIUM 500 MG PO TABS: 500.0000 mg | ORAL_TABLET | Freq: Four times a day (QID) | ORAL | Status: DC

## 2015-10-01 MED ORDER — IBUPROFEN 800 MG PO TABS: 800.0000 mg | ORAL_TABLET | Freq: Three times a day (TID) | ORAL | Status: DC

## 2015-10-01 MED ORDER — HYDROCODONE-ACETAMINOPHEN 5-325 MG PO TABS
1.0000 | ORAL_TABLET | Freq: Once | ORAL | Status: AC
  Administered 2015-10-01: 1 via ORAL
  Filled 2015-10-01: qty 1

## 2015-10-01 NOTE — ED Notes (Signed)
Bed: WA04 Expected date:  Expected time:  Means of arrival:  Comments: Face pain

## 2015-10-01 NOTE — ED Notes (Addendum)
Per EMs. Pt reports R side facial pain from tooth for the past month. Has appointment with dentist at 2pm, but could not wait. Took ibuprofen and heat at home with no relief. Pain decreased with ice with EMS. Pt reports pain is on lower R tooth. Also having facial and throat pain.

## 2015-10-01 NOTE — ED Provider Notes (Signed)
CSN: [id number]     Arrival date & time 10/01/15  [id number] History   First MD Initiated Contact with Patient 10/01/15 0730     Chief Complaint  Patient presents with  . Dental Pain     (Consider location/radiation/quality/duration/timing/severity/associated sxs/prior Treatment) Patient is a 30 y.o. female presenting with tooth pain. The history is provided by the patient and medical records.  Dental Pain   30 year old female with history of ovarian cysts, headaches, substance abuse, presenting to the ED for dental pain. Patient reports right lower dental pain for the past month due to multiple bad teeth. She has an appointment with her dentist today at 2 PM but states she could not wait. She states she took ibuprofen and Tylenol at home without relief. States EMS did apply ice to the right side of her face which seemed to help somewhat. States pain radiates into her right ear. She denies any fever or chills. Patient has not seen a dentist in almost 8 years.  VSS.   Past Medical History  Diagnosis Date  . STD (female)     hx of chlamydia and gonorrhea  . History of PID   . Endometriosis   . Glaucoma   . Ovarian cyst   . Bartholin cyst   . Headache(784.0)   . Infection     UTI  . Vaginal Pap smear, abnormal     has not followed up  . Substance abuse    Past Surgical History  Procedure Laterality Date  . Fracture surgery      left leg  . Dilation and curettage of uterus    . Laparoscopy     Family History  Problem Relation Age of Onset  . Anesthesia problems Neg Hx   . Hypotension Neg Hx   . Malignant hyperthermia Neg Hx   . Pseudochol deficiency Neg Hx   . Alcohol abuse Neg Hx   . Diabetes Mother   . Hypertension Maternal Grandmother   . Heart disease Maternal Grandmother     great grandma   Social History  Substance Use Topics  . Smoking status: Current Every Day Smoker -- 0.25 packs/day for 7 years  . Smokeless tobacco: Never Used  . Alcohol Use: No     Comment:  Denies ETOH use   OB History    Gravida Para Term Preterm AB TAB SAB Ectopic Multiple Living   4 2 1 1 2  0 2 0 0 2     Review of Systems  HENT: Positive for dental problem.   All other systems reviewed and are negative.     Allergies  Acyclovir and related; Darvocet; Doxycycline; Flexeril; Metoclopramide; Naproxen; Latex; and Tramadol  Home Medications   Prior to Admission medications   Medication Sig Start Date End Date Taking? Authorizing Provider  acetaminophen (TYLENOL) 500 MG tablet Take 1,000 mg by mouth every 6 (six) hours as needed for moderate pain.    Historical Provider, MD  azithromycin (ZITHROMAX) 500 MG tablet Take 2 tablets (1,000 mg total) by mouth once. Take first 2 tablets together, then 1 every day until finished. 07/20/15   Duane LopeJennifer I Rasch, NP  benztropine (COGENTIN) 1 MG tablet Take 1 tablet (1 mg total) by mouth 2 (two) times daily. For prevention of drug induced tremors. Patient not taking: Reported on 06/06/2015 03/30/15   Sanjuana KavaAgnes I Nwoko, NP  hydrOXYzine (ATARAX/VISTARIL) 25 MG tablet Take 1 tablet (25 mg total) by mouth 3 (three) times daily as needed for anxiety. Patient not  taking: Reported on 06/06/2015 03/30/15   Sanjuana Kava, NP  ibuprofen (ADVIL,MOTRIN) 200 MG tablet Take 400 mg by mouth every 6 (six) hours as needed for mild pain or moderate pain.    Historical Provider, MD  lamoTRIgine (LAMICTAL) 25 MG tablet Take 1 tablet (25 mg total) by mouth daily. For mood stabilization 08/23/15   Linwood Dibbles, MD  mirtazapine (REMERON) 7.5 MG tablet Take 1 tablet (7.5 mg total) by mouth at bedtime. For sleep Patient not taking: Reported on 06/06/2015 03/30/15   Sanjuana Kava, NP  nicotine (NICODERM CQ - DOSED IN MG/24 HOURS) 21 mg/24hr patch Place 1 patch (21 mg total) onto the skin daily. For smoking cessation Patient not taking: Reported on 06/06/2015 03/30/15   Sanjuana Kava, NP  ondansetron (ZOFRAN ODT) 4 MG disintegrating tablet Take 1 tablet (4 mg total) by mouth  every 8 (eight) hours as needed for nausea or vomiting. Patient not taking: Reported on 08/23/2015 07/13/15   Duane Lope, NP  SUBOXONE 8-2 MG FILM Place 1 strip under the tongue three times daily. 05/29/15   Historical Provider, MD  traZODone (DESYREL) 50 MG tablet Take 1 tablet (50 mg total) by mouth at bedtime. 08/23/15   Linwood Dibbles, MD   BP 122/91 mmHg  Pulse 111  Temp(Src) 97.8 F (36.6 C) (Oral)  Resp 22  SpO2 100%   Physical Exam  Constitutional: She is oriented to person, place, and time. She appears well-developed and well-nourished.  HENT:  Head: Normocephalic and atraumatic.  Right Ear: Tympanic membrane and ear canal normal.  Left Ear: Tympanic membrane and ear canal normal.  Nose: Nose normal.  Mouth/Throat: Uvula is midline, oropharynx is clear and moist and mucous membranes are normal. No oropharyngeal exudate, posterior oropharyngeal edema, posterior oropharyngeal erythema or tonsillar abscesses.  Teeth largely in very poor dentition, multiple right lower molars are broken with large cavities noted, surrounding gingiva appears mildly swollen and irritated, handling secretions appropriately, no trismus, no facial or neck swelling, normal phonation without stridor, airway clear  Eyes: Conjunctivae and EOM are normal. Pupils are equal, round, and reactive to light.  Neck: Normal range of motion.  Cardiovascular: Normal rate, regular rhythm and normal heart sounds.   Pulmonary/Chest: Effort normal and breath sounds normal.  Abdominal: Soft. Bowel sounds are normal.  Musculoskeletal: Normal range of motion.  Neurological: She is alert and oriented to person, place, and time.  Skin: Skin is warm and dry.  Psychiatric: Her affect is labile.  Labile mood-- intermittently screaming to sobbing within a few seconds, odd facial expressions  Nursing note and vitals reviewed.   ED Course  Procedures (including critical care time) Labs Review Labs Reviewed - No data to  display  Imaging Review No results found. I have personally reviewed and evaluated these images and lab results as part of my medical decision-making.   EKG Interpretation None      MDM   Final diagnoses:  Pain, dental   30 y.o. F here with dental pain.  No facial or neck swelling on exam, handling secretions well.  Likely acute on chronic due to her poor dentition and multiple cavities.  No definitive abscess.  Do not suspect ludwig's angina at this time.  D/c home with motrin, penicillin.  Follow-up with dentist today at 2pm as scheduled.  Discussed plan with patient, he/she acknowledged understanding and agreed with plan of care.  Return precautions given for new or worsening symptoms.  Garlon Hatchet, PA-C 10/01/15  1610  Raeford Razor, MD 10/02/15 [phone]

## 2015-10-01 NOTE — Discharge Instructions (Signed)
Take the prescribed medication as directed. Follow-up with your dentist today as scheduled for 2pm. Return here for new concerns.

## 2015-10-18 ENCOUNTER — Encounter (HOSPITAL_COMMUNITY): Payer: Self-pay | Admitting: Emergency Medicine

## 2015-10-18 ENCOUNTER — Emergency Department (HOSPITAL_COMMUNITY)
Admission: EM | Admit: 2015-10-18 | Discharge: 2015-10-18 | Disposition: A | Discharge status: Home / Self Care | Payer: Medicaid Other | Attending: Emergency Medicine | Admitting: Emergency Medicine

## 2015-10-18 DIAGNOSIS — F438 Other reactions to severe stress: Secondary | ICD-10-CM | POA: Insufficient documentation

## 2015-10-18 DIAGNOSIS — K0889 Other specified disorders of teeth and supporting structures: Secondary | ICD-10-CM | POA: Diagnosis present

## 2015-10-18 DIAGNOSIS — K029 Dental caries, unspecified: Secondary | ICD-10-CM | POA: Insufficient documentation

## 2015-10-18 DIAGNOSIS — F17200 Nicotine dependence, unspecified, uncomplicated: Secondary | ICD-10-CM | POA: Diagnosis not present

## 2015-10-18 MED ORDER — AMOXICILLIN-POT CLAVULANATE 875-125 MG PO TABS: 1.0000 | ORAL_TABLET | Freq: Two times a day (BID) | ORAL | Status: DC

## 2015-10-18 MED ORDER — LIDOCAINE VISCOUS 2 % MT SOLN: 15.0000 mL | Freq: Four times a day (QID) | OROMUCOSAL | Status: DC | PRN

## 2015-10-18 MED ORDER — KETOROLAC TROMETHAMINE 30 MG/ML IJ SOLN
60.0000 mg | Freq: Once | INTRAMUSCULAR | Status: AC
  Administered 2015-10-18: 60 mg via INTRAMUSCULAR
  Filled 2015-10-18: qty 2

## 2015-10-18 NOTE — Discharge Instructions (Signed)
Read the information below.   You are being prescribed an antibiotic. I have prescribed a lidocaine mouthwash for relief. Swish and spit as needed. You can take motrin for pain relief. Continue to use cool compresses to cheeks for relief.  Use the prescribed medication as directed.  Please discuss all new medications with your pharmacist.   It is very important that you follow up with oral surgery on Monday.  You may return to the Emergency Department at any time for worsening condition or any new symptoms that concern you. Return to ED if you develop difficulty breathing, difficulty swallowing, or inability to open your mouth.

## 2015-10-18 NOTE — ED Provider Notes (Signed)
CSN: [id number]     Arrival date & time 10/18/15  4098 History   First MD Initiated Contact with Patient 10/18/15 0606     Chief Complaint  Patient presents with  . Dental Pain     (Consider location/radiation/quality/duration/timing/severity/associated sxs/prior Treatment) HPI Comments: [name] is a 30 y.o. female with history of polysubstance abuse, MDD, and DUB presents to ED with complaint of dental pain. Dental pain has been on-going for one month. She was previously seen in the ED for the same complaint at the beginning of June, review of records indicate she was schedule for OP Dentist follow up. Per pt, she has 8 teeth that need to be pulled and she is scheduled to see the oral surgeon on Monday. She reports worsening dental pain with radiation into her ears and head b/l. States she feels like an "ice pick" in her ears. She has tried ibuprofen PM without relief. Patient endorses subjective fever. No changes in vision. No numbness or weakness.   Patient is a 30 y.o. female presenting with tooth pain. The history is provided by the patient.  Dental Pain Associated symptoms: fever ( subjective)     Past Medical History  Diagnosis Date  . STD (female)     hx of chlamydia and gonorrhea  . History of PID   . Endometriosis   . Glaucoma   . Ovarian cyst   . Bartholin cyst   . Headache(784.0)   . Infection     UTI  . Vaginal Pap smear, abnormal     has not followed up  . Substance abuse    Past Surgical History  Procedure Laterality Date  . Fracture surgery      left leg  . Dilation and curettage of uterus    . Laparoscopy     Family History  Problem Relation Age of Onset  . Anesthesia problems Neg Hx   . Hypotension Neg Hx   . Malignant hyperthermia Neg Hx   . Pseudochol deficiency Neg Hx   . Alcohol abuse Neg Hx   . Diabetes Mother   . Hypertension Maternal Grandmother   . Heart disease Maternal Grandmother     great grandma   Social History  Substance  Use Topics  . Smoking status: Current Every Day Smoker -- 0.25 packs/day for 7 years  . Smokeless tobacco: Never Used  . Alcohol Use: No     Comment: Denies ETOH use   OB History    Gravida Para Term Preterm AB TAB SAB Ectopic Multiple Living   0 2 0 0 2     Review of Systems  Constitutional: Positive for fever ( subjective).  HENT: Positive for dental problem, ear pain and trouble swallowing.   Eyes: Negative for visual disturbance.  Respiratory: Negative for shortness of breath.   Cardiovascular: Negative for chest pain.  Neurological: Negative for weakness and numbness.  Psychiatric/Behavioral: Positive for sleep disturbance ( secondary to pain).      Allergies  Acyclovir and related; Darvocet; Doxycycline; Flexeril; Metoclopramide; Naproxen; Latex; and Tramadol  Home Medications   Prior to Admission medications   Medication Sig Start Date End Date Taking? Authorizing Provider  acetaminophen (TYLENOL) 500 MG tablet Take 1,000 mg by mouth every 6 (six) hours as needed for moderate pain.    Historical Provider, MD  amoxicillin-clavulanate (AUGMENTIN) 875-125 MG tablet Take 1 tablet by mouth 2 (two) times daily. 10/18/15   Lona Kettle, PA-C  azithromycin (  ZITHROMAX) 500 MG tablet Take 2 tablets (1,000 mg total) by mouth once. Take first 2 tablets together, then 1 every day until finished. 07/20/15   Duane LopeJennifer I Rasch, NP  benztropine (COGENTIN) 1 MG tablet Take 1 tablet (1 mg total) by mouth 2 (two) times daily. For prevention of drug induced tremors. Patient not taking: Reported on 06/06/2015 03/30/15   Sanjuana KavaAgnes I Nwoko, NP  hydrOXYzine (ATARAX/VISTARIL) 25 MG tablet Take 1 tablet (25 mg total) by mouth 3 (three) times daily as needed for anxiety. Patient not taking: Reported on 06/06/2015 03/30/15   Sanjuana KavaAgnes I Nwoko, NP  ibuprofen (ADVIL,MOTRIN) 800 MG tablet Take 1 tablet (800 mg total) by mouth 3 (three) times daily. 10/01/15   Garlon HatchetLisa M Sanders, PA-C  lamoTRIgine (LAMICTAL)  25 MG tablet Take 1 tablet (25 mg total) by mouth daily. For mood stabilization 08/23/15   Linwood DibblesJon Knapp, MD  lidocaine (XYLOCAINE) 2 % solution Use as directed 15 mLs in the mouth or throat every 6 (six) hours as needed for mouth pain. Swish and spit every 6hours as needed. 10/18/15   Lona KettleAshley Laurel Mariah Harn, PA-C  mirtazapine (REMERON) 7.5 MG tablet Take 1 tablet (7.5 mg total) by mouth at bedtime. For sleep Patient not taking: Reported on 06/06/2015 03/30/15   Sanjuana KavaAgnes I Nwoko, NP  nicotine (NICODERM CQ - DOSED IN MG/24 HOURS) 21 mg/24hr patch Place 1 patch (21 mg total) onto the skin daily. For smoking cessation Patient not taking: Reported on 06/06/2015 03/30/15   Sanjuana KavaAgnes I Nwoko, NP  ondansetron (ZOFRAN ODT) 4 MG disintegrating tablet Take 1 tablet (4 mg total) by mouth every 8 (eight) hours as needed for nausea or vomiting. Patient not taking: Reported on 08/23/2015 07/13/15   Duane LopeJennifer I Rasch, NP  SUBOXONE 8-2 MG FILM Place 1 strip under the tongue three times daily. 05/29/15   Historical Provider, MD  traZODone (DESYREL) 50 MG tablet Take 1 tablet (50 mg total) by mouth at bedtime. 08/23/15   Linwood DibblesJon Knapp, MD   BP 123/89 mmHg  Pulse 61  Temp(Src) 98.2 F (36.8 C) (Oral)  Resp 18  Ht 5\' 3"  (1.6 m)  Wt 58.968 kg  BMI 23.03 kg/m2  SpO2 97%  LMP 09/10/2015 Physical Exam  Constitutional: She appears well-developed and well-nourished. She appears distressed.  HENT:  Head: Normocephalic and atraumatic.  Right Ear: Tympanic membrane, external ear and ear canal normal.  Left Ear: Tympanic membrane, external ear and ear canal normal.  Mouth/Throat: Uvula is midline, oropharynx is clear and moist and mucous membranes are normal. No trismus in the jaw. Abnormal dentition. Dental caries present. No uvula swelling. No oropharyngeal exudate, posterior oropharyngeal edema, posterior oropharyngeal erythema or tonsillar abscesses.  No tongue swelling or sublingual swelling. She is managing her oral secretions well. Multiple  dental caries and broken teeth noted. Minimal swelling of gingiva appreciated. Dentition are diffusely tender to palpation. No facial or neck swelling. Normal phonation. No stridor. No trismus.   Eyes: Conjunctivae are normal. Pupils are equal, round, and reactive to light. Right eye exhibits no discharge. Left eye exhibits no discharge. No scleral icterus.  Neck: Normal range of motion and phonation normal. Neck supple. No rigidity. Normal range of motion present.  Cardiovascular: Normal rate, regular rhythm and normal heart sounds.   No murmur heard. Pulmonary/Chest: Effort normal. No stridor. No respiratory distress. She has no wheezes.  Abdominal: Soft. Bowel sounds are normal.  Musculoskeletal: Normal range of motion.  Neurological: She is alert. Coordination normal.  Skin: Skin  is warm and dry. She is not diaphoretic.  Psychiatric: Her affect is labile.  Pt's mood is labile. She is walking around sobbing and moaning, with heat packs on each side of her jaw. On exam, she moaning with odd facial expressions.     ED Course  Procedures (including critical care time) Labs Review Labs Reviewed - No data to display  Imaging Review No results found. I have personally reviewed and evaluated these images and lab results as part of my medical decision-making.   EKG Interpretation None      MDM   Final diagnoses:  Pain, dental   Patient is afebrile and non-toxic appearing. Her vital signs are stable. Patient with dentalgia.  Pt is managing her oral secretions well. No abscess requiring immediate incision and drainage noted.  Exam not concerning for Ludwig's angina or pharyngeal abscess. Toradol given in ED.   8:23 AM: On re-evaluation, pt is sleeping in the bed. Pain is 7/10. Suspect this is acute on chronic dental pain secondary to poor dentition. D/c on ABX and naprosyn. Pt is schedule to see oral Surgeon on Monday. Pt instructed to follow-up with oral surgeon.  Discussed return  precautions. Pt. Voiced understanding and is agreeable.   Lona KettleAshley Laurel Aadarsh Cozort, PA-C 10/19/15 0719  Tilden FossaElizabeth Rees, MD 10/20/15 [phone]

## 2015-10-18 NOTE — ED Notes (Signed)
Brought by EMS for toothache that's been going on for a month.  Scheduled to see oral surgeon on Monday.

## 2015-11-03 ENCOUNTER — Emergency Department (HOSPITAL_COMMUNITY)
Admission: EM | Admit: 2015-11-03 | Discharge: 2015-11-03 | Disposition: A | Discharge status: Other Acute Inpatient Hospital | Payer: Medicaid Other | Attending: Emergency Medicine | Admitting: Emergency Medicine

## 2015-11-03 ENCOUNTER — Emergency Department (HOSPITAL_COMMUNITY): Payer: Medicaid Other

## 2015-11-03 ENCOUNTER — Encounter (HOSPITAL_COMMUNITY): Payer: Self-pay | Admitting: *Deleted

## 2015-11-03 ENCOUNTER — Encounter (HOSPITAL_COMMUNITY): Payer: Self-pay

## 2015-11-03 ENCOUNTER — Inpatient Hospital Stay (HOSPITAL_COMMUNITY)
Admission: AD | Admit: 2015-11-03 | Discharge: 2015-11-08 | DRG: 897 | Disposition: A | Discharge status: Home / Self Care | Payer: Medicaid Other | Source: Intra-hospital | Attending: Psychiatry | Admitting: Psychiatry

## 2015-11-03 DIAGNOSIS — N390 Urinary tract infection, site not specified: Secondary | ICD-10-CM | POA: Diagnosis present

## 2015-11-03 DIAGNOSIS — F1410 Cocaine abuse, uncomplicated: Secondary | ICD-10-CM | POA: Diagnosis present

## 2015-11-03 DIAGNOSIS — F17210 Nicotine dependence, cigarettes, uncomplicated: Secondary | ICD-10-CM | POA: Diagnosis present

## 2015-11-03 DIAGNOSIS — R102 Pelvic and perineal pain: Secondary | ICD-10-CM | POA: Insufficient documentation

## 2015-11-03 DIAGNOSIS — F1490 Cocaine use, unspecified, uncomplicated: Secondary | ICD-10-CM | POA: Diagnosis not present

## 2015-11-03 DIAGNOSIS — Z791 Long term (current) use of non-steroidal anti-inflammatories (NSAID): Secondary | ICD-10-CM | POA: Diagnosis not present

## 2015-11-03 DIAGNOSIS — F332 Major depressive disorder, recurrent severe without psychotic features: Secondary | ICD-10-CM | POA: Diagnosis not present

## 2015-11-03 DIAGNOSIS — N939 Abnormal uterine and vaginal bleeding, unspecified: Secondary | ICD-10-CM | POA: Diagnosis not present

## 2015-11-03 DIAGNOSIS — F1290 Cannabis use, unspecified, uncomplicated: Secondary | ICD-10-CM | POA: Insufficient documentation

## 2015-11-03 DIAGNOSIS — Z79899 Other long term (current) drug therapy: Secondary | ICD-10-CM | POA: Insufficient documentation

## 2015-11-03 DIAGNOSIS — F1414 Cocaine abuse with cocaine-induced mood disorder: Principal | ICD-10-CM | POA: Diagnosis present

## 2015-11-03 DIAGNOSIS — F1494 Cocaine use, unspecified with cocaine-induced mood disorder: Secondary | ICD-10-CM | POA: Diagnosis present

## 2015-11-03 DIAGNOSIS — F1110 Opioid abuse, uncomplicated: Secondary | ICD-10-CM | POA: Diagnosis not present

## 2015-11-03 DIAGNOSIS — R1032 Left lower quadrant pain: Secondary | ICD-10-CM | POA: Diagnosis present

## 2015-11-03 DIAGNOSIS — F17200 Nicotine dependence, unspecified, uncomplicated: Secondary | ICD-10-CM | POA: Insufficient documentation

## 2015-11-03 DIAGNOSIS — R112 Nausea with vomiting, unspecified: Secondary | ICD-10-CM | POA: Diagnosis not present

## 2015-11-03 DIAGNOSIS — M542 Cervicalgia: Secondary | ICD-10-CM | POA: Diagnosis not present

## 2015-11-03 DIAGNOSIS — R197 Diarrhea, unspecified: Secondary | ICD-10-CM | POA: Insufficient documentation

## 2015-11-03 DIAGNOSIS — R319 Hematuria, unspecified: Secondary | ICD-10-CM | POA: Insufficient documentation

## 2015-11-03 DIAGNOSIS — F1120 Opioid dependence, uncomplicated: Secondary | ICD-10-CM | POA: Diagnosis not present

## 2015-11-03 DIAGNOSIS — N949 Unspecified condition associated with female genital organs and menstrual cycle: Secondary | ICD-10-CM

## 2015-11-03 LAB — URINALYSIS, ROUTINE W REFLEX MICROSCOPIC
BILIRUBIN URINE: NEGATIVE
GLUCOSE, UA: NEGATIVE mg/dL
KETONES UR: NEGATIVE mg/dL
LEUKOCYTES UA: NEGATIVE
NITRITE: POSITIVE — AB
PROTEIN: NEGATIVE mg/dL
Specific Gravity, Urine: 1.024 (ref 1.005–1.030)
pH: 5.5 (ref 5.0–8.0)

## 2015-11-03 LAB — COMPREHENSIVE METABOLIC PANEL
ALBUMIN: 4.1 g/dL (ref 3.5–5.0)
ALT: 16 U/L (ref 14–54)
ANION GAP: 9 (ref 5–15)
AST: 16 U/L (ref 15–41)
Alkaline Phosphatase: 61 U/L (ref 38–126)
BILIRUBIN TOTAL: 0.1 mg/dL — AB (ref 0.3–1.2)
BUN: 19 mg/dL (ref 6–20)
CHLORIDE: 104 mmol/L (ref 101–111)
CO2: 24 mmol/L (ref 22–32)
Calcium: 8.6 mg/dL — ABNORMAL LOW (ref 8.9–10.3)
Creatinine, Ser: 0.75 mg/dL (ref 0.44–1.00)
GFR calc Af Amer: >60 mL/min (ref >60)
GFR calc non Af Amer: >60 mL/min (ref >60)
GLUCOSE: 87 mg/dL (ref 65–99)
POTASSIUM: 3.6 mmol/L (ref 3.5–5.1)
SODIUM: 137 mmol/L (ref 135–145)
Total Protein: 7.7 g/dL (ref 6.5–8.1)

## 2015-11-03 LAB — CBC
HEMATOCRIT: 35.7 % — AB (ref 36.0–46.0)
HEMOGLOBIN: 12.6 g/dL (ref 12.0–15.0)
MCH: 30.5 pg (ref 26.0–34.0)
MCHC: 35.3 g/dL (ref 30.0–36.0)
MCV: 86.4 fL (ref 78.0–100.0)
PLATELETS: 507 10*3/uL — AB (ref 150–400)
RBC: 4.13 MIL/uL (ref 3.87–5.11)
RDW: 14.4 % (ref 11.5–15.5)
WBC: 9.3 10*3/uL (ref 4.0–10.5)

## 2015-11-03 LAB — I-STAT BETA HCG BLOOD, ED (MC, WL, AP ONLY): I-stat hCG, quantitative: <5 m[IU]/mL (ref <5)

## 2015-11-03 LAB — WET PREP, GENITAL
Sperm: NONE SEEN
TRICH WET PREP: NONE SEEN
YEAST WET PREP: NONE SEEN

## 2015-11-03 LAB — URINE MICROSCOPIC-ADD ON

## 2015-11-03 LAB — RAPID URINE DRUG SCREEN, HOSP PERFORMED
Amphetamines: NOT DETECTED
Barbiturates: NOT DETECTED
Benzodiazepines: POSITIVE — AB
Cocaine: POSITIVE — AB
OPIATES: POSITIVE — AB
TETRAHYDROCANNABINOL: POSITIVE — AB

## 2015-11-03 LAB — LIPASE, BLOOD: Lipase: 42 U/L (ref 11–51)

## 2015-11-03 MED ORDER — LORAZEPAM 1 MG PO TABS
1.0000 mg | ORAL_TABLET | Freq: Three times a day (TID) | ORAL | Status: DC | PRN
  Administered 2015-11-03: 1 mg via ORAL
  Filled 2015-11-03: qty 1

## 2015-11-03 MED ORDER — TRAZODONE HCL 50 MG PO TABS: 50.0000 mg | ORAL_TABLET | Freq: Every day | ORAL | Status: DC

## 2015-11-03 MED ORDER — ONDANSETRON 4 MG PO TBDP: 4.0000 mg | ORAL_TABLET | Freq: Four times a day (QID) | ORAL | Status: DC | PRN

## 2015-11-03 MED ORDER — CEFTRIAXONE SODIUM 250 MG IJ SOLR
250.0000 mg | Freq: Once | INTRAMUSCULAR | Status: AC
  Administered 2015-11-03: 250 mg via INTRAMUSCULAR
  Filled 2015-11-03 (×2): qty 250

## 2015-11-03 MED ORDER — IBUPROFEN 200 MG PO TABS
600.0000 mg | ORAL_TABLET | Freq: Three times a day (TID) | ORAL | Status: DC | PRN
  Administered 2015-11-03: 600 mg via ORAL
  Filled 2015-11-03: qty 3

## 2015-11-03 MED ORDER — AZITHROMYCIN 250 MG PO TABS
1000.0000 mg | ORAL_TABLET | Freq: Once | ORAL | Status: AC
  Administered 2015-11-03: 1000 mg via ORAL
  Filled 2015-11-03: qty 4

## 2015-11-03 MED ORDER — DICYCLOMINE HCL 20 MG PO TABS
20.0000 mg | ORAL_TABLET | Freq: Four times a day (QID) | ORAL | Status: DC | PRN
  Administered 2015-11-03 – 2015-11-08 (×8): 20 mg via ORAL
  Filled 2015-11-03 (×9): qty 1

## 2015-11-03 MED ORDER — HYDROCODONE-ACETAMINOPHEN 5-325 MG PO TABS: 1.0000 | ORAL_TABLET | Freq: Four times a day (QID) | ORAL | Status: DC | PRN

## 2015-11-03 MED ORDER — ACETAMINOPHEN 325 MG PO TABS: 650.0000 mg | ORAL_TABLET | ORAL | Status: DC | PRN

## 2015-11-03 MED ORDER — ALUM & MAG HYDROXIDE-SIMETH 200-200-20 MG/5ML PO SUSP: 30.0000 mL | ORAL | Status: DC | PRN

## 2015-11-03 MED ORDER — LOPERAMIDE HCL 2 MG PO CAPS
2.0000 mg | ORAL_CAPSULE | ORAL | Status: DC | PRN
  Administered 2015-11-03: 4 mg via ORAL
  Filled 2015-11-03: qty 2

## 2015-11-03 MED ORDER — ENSURE ENLIVE PO LIQD: 237.0000 mL | Freq: Two times a day (BID) | ORAL | Status: DC

## 2015-11-03 MED ORDER — MIRTAZAPINE 7.5 MG PO TABS: 7.5000 mg | ORAL_TABLET | Freq: Every day | ORAL | Status: DC

## 2015-11-03 MED ORDER — NICOTINE 21 MG/24HR TD PT24
21.0000 mg | MEDICATED_PATCH | Freq: Every day | TRANSDERMAL | Status: DC
  Filled 2015-11-03 (×2): qty 1

## 2015-11-03 MED ORDER — HYDROXYZINE HCL 25 MG PO TABS
25.0000 mg | ORAL_TABLET | Freq: Four times a day (QID) | ORAL | Status: DC | PRN
  Administered 2015-11-03 – 2015-11-04 (×2): 25 mg via ORAL
  Filled 2015-11-03 (×2): qty 1

## 2015-11-03 MED ORDER — METRONIDAZOLE 500 MG PO TABS
500.0000 mg | ORAL_TABLET | Freq: Once | ORAL | Status: AC
  Administered 2015-11-03: 500 mg via ORAL
  Filled 2015-11-03: qty 1

## 2015-11-03 MED ORDER — TRAZODONE HCL 50 MG PO TABS
50.0000 mg | ORAL_TABLET | Freq: Every evening | ORAL | Status: DC | PRN
  Administered 2015-11-04 – 2015-11-06 (×3): 50 mg via ORAL
  Filled 2015-11-03 (×3): qty 1

## 2015-11-03 MED ORDER — ONDANSETRON 8 MG PO TBDP
8.0000 mg | ORAL_TABLET | Freq: Once | ORAL | Status: AC
  Administered 2015-11-03: 8 mg via ORAL
  Filled 2015-11-03: qty 1

## 2015-11-03 MED ORDER — CEPHALEXIN 500 MG PO CAPS
500.0000 mg | ORAL_CAPSULE | Freq: Two times a day (BID) | ORAL | Status: DC
  Administered 2015-11-03: 500 mg via ORAL
  Filled 2015-11-03: qty 1

## 2015-11-03 MED ORDER — MAGNESIUM HYDROXIDE 400 MG/5ML PO SUSP: 30.0000 mL | Freq: Every day | ORAL | Status: DC | PRN

## 2015-11-03 MED ORDER — IBUPROFEN 600 MG PO TABS
600.0000 mg | ORAL_TABLET | Freq: Four times a day (QID) | ORAL | Status: DC | PRN
  Administered 2015-11-03 – 2015-11-08 (×8): 600 mg via ORAL
  Filled 2015-11-03 (×8): qty 1

## 2015-11-03 MED ORDER — CLONIDINE HCL 0.1 MG PO TABS
0.1000 mg | ORAL_TABLET | ORAL | Status: AC
  Administered 2015-11-06 – 2015-11-07 (×4): 0.1 mg via ORAL
  Filled 2015-11-03 (×4): qty 1

## 2015-11-03 MED ORDER — ZOLPIDEM TARTRATE 5 MG PO TABS: 5.0000 mg | ORAL_TABLET | Freq: Every evening | ORAL | Status: DC | PRN

## 2015-11-03 MED ORDER — LAMOTRIGINE 25 MG PO TABS
25.0000 mg | ORAL_TABLET | Freq: Every day | ORAL | Status: DC
  Filled 2015-11-03: qty 1

## 2015-11-03 MED ORDER — NICOTINE 14 MG/24HR TD PT24
14.0000 mg | MEDICATED_PATCH | Freq: Every day | TRANSDERMAL | Status: DC
  Administered 2015-11-05 – 2015-11-07 (×3): 14 mg via TRANSDERMAL
  Filled 2015-11-03 (×7): qty 1

## 2015-11-03 MED ORDER — TRAZODONE HCL 50 MG PO TABS
50.0000 mg | ORAL_TABLET | Freq: Every evening | ORAL | Status: DC | PRN
  Administered 2015-11-03: 50 mg via ORAL
  Filled 2015-11-03: qty 1

## 2015-11-03 MED ORDER — CLONIDINE HCL 0.1 MG PO TABS
0.1000 mg | ORAL_TABLET | Freq: Every day | ORAL | Status: DC
  Administered 2015-11-08: 0.1 mg via ORAL
  Filled 2015-11-03 (×2): qty 1

## 2015-11-03 MED ORDER — METHOCARBAMOL 500 MG PO TABS
500.0000 mg | ORAL_TABLET | Freq: Three times a day (TID) | ORAL | Status: DC | PRN
  Administered 2015-11-03 – 2015-11-05 (×4): 500 mg via ORAL
  Filled 2015-11-03 (×4): qty 1

## 2015-11-03 MED ORDER — CLONIDINE HCL 0.1 MG PO TABS
0.1000 mg | ORAL_TABLET | Freq: Four times a day (QID) | ORAL | Status: AC
  Administered 2015-11-03 – 2015-11-05 (×5): 0.1 mg via ORAL
  Filled 2015-11-03 (×12): qty 1

## 2015-11-03 MED ORDER — KETOROLAC TROMETHAMINE 30 MG/ML IJ SOLN
30.0000 mg | Freq: Once | INTRAMUSCULAR | Status: AC
  Administered 2015-11-03: 30 mg via INTRAMUSCULAR
  Filled 2015-11-03: qty 1

## 2015-11-03 MED ORDER — NICOTINE 21 MG/24HR TD PT24: 21.0000 mg | MEDICATED_PATCH | Freq: Every day | TRANSDERMAL | Status: DC

## 2015-11-03 MED ORDER — LIDOCAINE HCL 1 % IJ SOLN
INTRAMUSCULAR | Status: AC
  Administered 2015-11-03: 0.9 mL via INTRAMUSCULAR
  Filled 2015-11-03: qty 20

## 2015-11-03 MED ORDER — ONDANSETRON HCL 4 MG PO TABS: 4.0000 mg | ORAL_TABLET | Freq: Three times a day (TID) | ORAL | Status: DC | PRN

## 2015-11-03 NOTE — BH Assessment (Signed)
[facility] Assessment Progress Note  Per Thedore MinsMojeed Akintayo, MD, this pt requires psychiatric hospitalization at this time.  Kathy Heinrichina Tate, RN, [facility]  Pt has signed Voluntary Admission and Consent for Treatment, as well as Consent to Release Information to no one, and signed [facility].  Pt's nurse, Diane, has been notified, and agrees to send original paperwork along with pt via [facility], and to call report to [phone].  Kathy Canninghomas Evyn Putzier, MA Triage Specialist [phone]

## 2015-11-03 NOTE — ED Notes (Signed)
Patient is aware we need urine 

## 2015-11-03 NOTE — Progress Notes (Signed)
Patient did not attend AA group tonight.  

## 2015-11-03 NOTE — ED Notes (Addendum)
Patient states she has had intermittent lower abdominal pain, L>R, x2-3 days.  Patient also endorses vomiting and diarrhea with "a bit of a fever" 2 days last week.  Patient has what sounds like a smoker's cough x1-2 months.  Patient endorses some vaginal discharge and bleeding.  Patient states she has unprotected sex with people for money.  Patient has hx of endometriosis and PID.   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  Patient belongs to a suboxone clinic, but hasn't been in month or so and she is not currently taking suboxone.  Patient snorted heroin a couple days ago and has used marijuana recently. Patient's last cocaine use was yesterday when she smoked crack.  Patient denies SI/HI, but states she is depressed.  Patient states she is not homeless and lives with her baby's father and her older child.    [facility], but has not followed-up with them yet.

## 2015-11-03 NOTE — ED Notes (Signed)
Informed patient that we need her urine.  Asked her several times.  Patient has had 3 sodas

## 2015-11-03 NOTE — ED Notes (Signed)
Pt denies SI/HI, AVH. She does not appear to be delusional. She is cooperative and pleasant. She said that she came here for help with addiction.

## 2015-11-03 NOTE — ED Notes (Signed)
Patient transported to Ultrasound 

## 2015-11-03 NOTE — BH Assessment (Addendum)
Assessment Note  [name] is an 30 y.o. female. She presents to [facility] requesting help for substance abuse. Patient reports 10 months of sobriety and Suboxone treatment unitl 1 month ago. Sts that over the past month she relapsed using THC, Heroin, and Cocaine. SEE ADDITIONAL SOCIAL HISTORY listed below for details related to patient's substance use. Last use was yesterday for each substance. The start of patient's substance use started yrs ago after a doctor gave her pain pills for endormetriosis. Patient sts, "I really like the euphoria of the pain pills and I couldn't stop taking them so I got on other drugs".  Patient sts, "I need help..I am depressed.Marland KitchenMarland KitchenI need a structured environment.Marland KitchenMarland KitchenI want to overcome my addiction". She feels angry at herself because she was doing well for several months. Patient blames her making bad decisions recently on her drug use. She has recently started having sex without protection to obtain money for the drugs. She denies SI and HI. She has a history of self mutilating (cutting). Patient reports auditory hallucinations of voices whispering her name. She reports seeing shadows. Patient with increased symptoms of paranoia. Sts, "I am up all night checking windows and doors due to fear that someone will come in my home. Patient paranoid that someone put hidden camera's in her home. She denies. She has a history of IN[facility] treatment at [facility], [facility], and [facility]. She was seeking outpatient mental health and substance abuse treatment at [facility].   Diagnosis: Major Depressive Disorder, Recurrent, Severe, with Psychotic Features and Polysubstance Abuse  Past Medical History:  Past Medical History  Diagnosis Date  . STD (female)     hx of chlamydia and gonorrhea  . History of PID   . Endometriosis   . Glaucoma   . Ovarian cyst   . Bartholin cyst   . Headache(784.0)   . Infection     UTI  . Vaginal Pap smear, abnormal      has not followed up  . Substance abuse     Past Surgical History  Procedure Laterality Date  . Fracture surgery      left leg  . Dilation and curettage of uterus    . Laparoscopy      Family History:  Family History  Problem Relation Age of Onset  . Anesthesia problems Neg Hx   . Hypotension Neg Hx   . Malignant hyperthermia Neg Hx   . Pseudochol deficiency Neg Hx   . Alcohol abuse Neg Hx   . Diabetes Mother   . Hypertension Maternal Grandmother   . Heart disease Maternal Grandmother     great grandma    Social History:  reports that she has been smoking.  She has never used smokeless tobacco. She reports that she uses illicit drugs (Marijuana, Cocaine, and Heroin). She reports that she does not drink alcohol.  Additional Social History:  Alcohol / Drug Use Pain Medications: SEE MAR Prescriptions: SEE MAR Over the Counter: SEE MAR Longest period of sobriety (when/how long): unknown Negative Consequences of Use: Financial, Personal relationships Withdrawal Symptoms: Agitation, Cramps, Other (Comment), Nausea / Vomiting, Sweats, Fever / Chills Substance #1 Name of Substance 1: Cocaine  1 - Age of First Use: 30 yrs old  1 - Amount (size/oz): 1 gram per use 1 - Frequency: 3x's in the past 2 weeks 1 - Duration: 1 month 1 - Last Use / Amount: yesterday; 1 gram  Substance #2 Name of Substance 2:  THC 2 - Amount (size/oz): 1 gram 2 - Frequency: 3-4 x week 2 - Duration: on-going 2 - Last Use / Amount: yesterday  Substance #3 Name of Substance 3: heroin 3 - Amount (size/oz): unsure 3 - Frequency: 1x in th epast month 3 - Duration: 1 month  3 - Last Use / Amount: yesterday   CIWA: CIWA-Ar BP: 101/67 mmHg Pulse Rate: (!) 53 COWS: Clinical Opiate Withdrawal Scale (COWS) Resting Pulse Rate: Pulse Rate 80 or below Sweating: No report of chills or flushing Restlessness: Able to sit still Pupil Size: Pupils pinned or normal size for room light Bone or Joint Aches:  Mild diffuse discomfort Runny Nose or Tearing: Not present GI Upset: Stomach cramps Tremor: No tremor Yawning: No yawning Anxiety or Irritability: Patient reports increasing irritability or anxiousness Gooseflesh Skin: Skin is smooth COWS Total Score: 3  Allergies:  Allergies  Allergen Reactions  . Acyclovir And Related Swelling  . Darvocet [Propoxyphene N-Acetaminophen] Hives  . Doxycycline Swelling  . Flexeril [Cyclobenzaprine] Nausea And Vomiting  . Metoclopramide Hives  . Naproxen Hives    Pt is able to take ibuprofen without problems  . Latex Rash  . Tramadol Rash    Home Medications:  (Not in a hospital admission)  OB/GYN Status:  Patient's last menstrual period was 10/31/2015.  General Assessment Data Location of Assessment: [facility] TTS Assessment: In system Is this a Tele or Face-to-Face Assessment?: Face-to-Face Is this an Initial Assessment or a Re-assessment for this encounter?: Initial Assessment Marital status: Single Maiden name:  (n/a) Is patient pregnant?: Unknown Pregnancy Status: Unknown Living Arrangements: Spouse/significant other, Children Can pt return to current living arrangement?: Yes Admission Status: Voluntary Is patient capable of signing voluntary admission?: Yes Referral Source: Self/Family/Friend Insurance type:  (Self Pay )     Crisis Care Plan Living Arrangements: Spouse/significant other, Children Legal Guardian:  (no guardian ) Name of Psychiatrist:  (no psychiatrist ) Name of Therapist:  (no therapist )  Education Status Is patient currently in school?: No Current Grade:  (n/a) Highest grade of school patient has completed:  (10th grade) Name of school:  (n/a) Contact person:  (n/a)  Risk to self with the past 6 months Suicidal Ideation: No Has patient been a risk to self within the past 6 months prior to admission? : No Suicidal Intent: No Has patient had any suicidal intent within the past 6 months prior to admission?  : No Is patient at risk for suicide?: No Suicidal Plan?: No Has patient had any suicidal plan within the past 6 months prior to admission? : No Access to Means: No What has been your use of drugs/alcohol within the last 12 months?:  (n/a) Previous Attempts/Gestures: No How many times?:  (0) Other Self Harm Risks:  (n/a) Triggers for Past Attempts:  (n/a) Intentional Self Injurious Behavior: Cutting (history of cutting ) Comment - Self Injurious Behavior:  (history of cutting ) Family Suicide History: No Recent stressful life event(s): Other (Comment) ("I want to get off drugs"; relapsed 1 month ago) Persecutory voices/beliefs?: No Depression: Yes Depression Symptoms: Feeling angry/irritable, Feeling worthless/self pity, Loss of interest in usual pleasures, Guilt, Fatigue, Isolating, Tearfulness, Insomnia, Despondent Substance abuse history and/or treatment for substance abuse?: No Suicide prevention information given to non-admitted patients: Not applicable  Risk to Others within the past 6 months Homicidal Ideation: No Does patient have any lifetime risk of violence toward others beyond the six months prior to admission? : No Thoughts of Harm to Others:  No Current Homicidal Intent: No Current Homicidal Plan: No Access to Homicidal Means: No Identified Victim:  (n/a) History of harm to others?: No Assessment of Violence: None Noted Violent Behavior Description:  (patient is calm and cooperative ) Does patient have access to weapons?: No Criminal Charges Pending?: No Does patient have a court date: No Is patient on probation?: No  Psychosis Hallucinations: Patient reports visual hallucinations of someone calling her name. She has visual hallucinations of shadows. Patient with symptoms of paranoia.  Sts she is up all night checking doors and windows fearful that someone will break into her apartment. She fears that cameras are in her home. Sts that she thinks this Clinical research associate has a  hidden recorder.  Delusions: None noted  Mental Status Report Appearance/Hygiene: Disheveled Eye Contact: Good Motor Activity: Freedom of movement Speech: Logical/coherent Level of Consciousness: Alert Mood: Depressed Affect: Appropriate to circumstance Anxiety Level: None Thought Processes: Relevant, Coherent Judgement: Impaired Orientation: Person, Place, Time, Situation Obsessive Compulsive Thoughts/Behaviors: None  Cognitive Functioning Concentration: Decreased Memory: Recent Intact, Remote Intact IQ: Average Insight: Fair Impulse Control: Poor Appetite: Poor Weight Loss:  (patient reports weight loss but unsure of amount) Weight Gain:  (denies) Sleep: Decreased Total Hours of Sleep:  (varies) Vegetative Symptoms: None  ADLScreening [facility] Assessment Services) Patient's cognitive ability adequate to safely complete daily activities?: Yes Patient able to express need for assistance with ADLs?: Yes Independently performs ADLs?: Yes (appropriate for developmental age)  Prior Inpatient Therapy Prior Inpatient Therapy: Yes Prior Therapy Dates:  (past hospitalizations ) Prior Therapy Facilty/Provider(s):  ([facility], [place], [facility] in [place]) Reason for Treatment:  (n/a)  Prior Outpatient Therapy Prior Outpatient Therapy: Yes Prior Therapy Dates:  (current ) Prior Therapy Facilty/Provider(s):  ([facility]) Reason for Treatment:  (Suboxone and therapy) Does patient have an ACCT team?: No Does patient have Intensive In-House Services?  : No Does patient have [facility] services? : No Does patient have P4[facility] services?: No  ADL Screening (condition at time of admission) Patient's cognitive ability adequate to safely complete daily activities?: Yes Is the patient deaf or have difficulty hearing?: No Does the patient have difficulty seeing, even when wearing glasses/contacts?: No Does the patient have difficulty concentrating, remembering, or making  decisions?: No Patient able to express need for assistance with ADLs?: Yes Does the patient have difficulty dressing or bathing?: No Independently performs ADLs?: Yes (appropriate for developmental age) Does the patient have difficulty walking or climbing stairs?: No Weakness of Legs: None Weakness of Arms/Hands: None  Home Assistive Devices/Equipment Home Assistive Devices/Equipment: None    Abuse/Neglect Assessment (Assessment to be complete while patient is alone) Physical Abuse: Denies Verbal Abuse: Denies Sexual Abuse: Denies Exploitation of patient/patient's resources: Denies Self-Neglect: Denies Values / Beliefs Cultural Requests During Hospitalization: None Spiritual Requests During Hospitalization: None   Advance Directives (For Healthcare) Does patient have an advance directive?: No Would patient like information on creating an advanced directive?: No - patient declined information Nutrition Screen- [facility] Patient's home diet: Regular  Additional Information 1:1 In Past 12 Months?: No CIRT Risk: No Elopement Risk: No Does patient have medical clearance?: Yes     Disposition:  Disposition Initial Assessment Completed for this Encounter: Yes Disposition of Patient: Inpatient treatment program (Per Dr. Jannifer Franklin patient meets criteria for INPT treatment) Type of inpatient treatment program: Adult  On Site Evaluation by:   Reviewed with Physician:    Melynda Ripple [facility] 11/03/2015 10:33 AM

## 2015-11-03 NOTE — Tx Team (Signed)
Initial Interdisciplinary Treatment Plan   PATIENT STRESSORS: Health problems Occupational concerns   PATIENT STRENGTHS: Communication skills General fund of knowledge   PROBLEM LIST: Problem List/Patient Goals Date to be addressed Date deferred Reason deferred Estimated date of resolution  Depression 11/03/15     Substance abuse 11/03/15     "To speak up when I'm hurting" 11/03/15     "Be able to talk about my feelings" 11/03/15                                    DISCHARGE CRITERIA:  Improved stabilization in mood, thinking, and/or behavior Verbal commitment to aftercare and medication compliance Withdrawal symptoms are absent or subacute and managed without 24-hour nursing intervention  PRELIMINARY DISCHARGE PLAN: Outpatient therapy Medication management  PATIENT/FAMIILY INVOLVEMENT: This treatment plan has been presented to and reviewed with the patient, Mancel ParsonsSarah D Phegley.  The patient and family have been given the opportunity to ask questions and make suggestions.  Norm ParcelHeather V Wynema Garoutte 11/03/2015, 6:59 PM

## 2015-11-03 NOTE — ED Notes (Signed)
TTS has come an visited with patient

## 2015-11-03 NOTE — Progress Notes (Signed)
[name] is a 30 y.o. female [facility]  She came in requesting help for substance abuse. She reports 10 months of sobriety and Suboxone treatment until 1 month ago. She reports that over the past month she relapsed using THC, Heroin, and Cocaine.  [facility], "I need help..I am depressed.Marland Kitchen.Marland Kitchen.I need a structured environment.Marland Kitchen.Marland Kitchen.I want to overcome my addiction". She feels angry at herself because she was doing well for several months. Patient blames her making bad decisions recently on her drug use. She has recently started having sex without protection to obtain money for the drugs. She denies SI and HI. She has a history of self-mutilating (cutting). Patient reports auditory hallucinations of voices whispering her name. She reports seeing shadows increased feelings of paranoia. She reports multiple psychiatric hospitalizations in the past.  She is diagnosed with Major Depressive Disorder, Recurrent, Severe, with Psychotic Features and Polysubstance Abuse.  Admission paperwork completed and signed.  Belongings searched and secured in locker # 42.  Skin assessment completed and noted rash on right wrist.  Q 15 minute checks initiated for safety.  We will monitor the progress towards her goals.

## 2015-11-03 NOTE — ED Provider Notes (Signed)
CSN: [id number]     Arrival date & time 11/03/15  0736 History   First MD Initiated Contact with Patient 11/03/15 0756     Chief Complaint  Patient presents with  . Abdominal Pain  . Drug Problem     (Consider location/radiation/quality/duration/timing/severity/associated sxs/prior Treatment) HPI Comments: [name] is a 30 y.o. Female with history of endometriosis, substance abuse, and MDD  presents to ED with complaint of lower abdominal pain. Pain started approximately 3 days ago in the lower abdomen. She has associated dyspareunia, vaginal bleeding, vaginal pain, pelvic pain, flank pain, nausea, vomiting, diarrhea. She engaged in anal intercourse with her baby's father followed by vaginal intercourse, states "I don't think he cleaned off well enough." She endorses a history of endometriosis and PID. She says the pain "feels like my endometriosis." She is sexually active with multiple partners and does not endorse using barrier protection every time. Concern for STIs.   Pt also endorses depression and anxiety. She has associated sleep disturbances and paranoia. She states she feels people are "watching me" and "videotaping me" and when she hears whispering she thinks "people are plotting against me." She states she checks the doors and locks constantly at her home. She denies SI/HI. She has a history of substance abuse - cocaine use x 1 day ago and heroin use x 2 days ago. She was scheduled to go to a suboxone clinic; however, she has not gone in over a month. She is seeking help for her depression, paranoia, and substance abuse.   Patient is a 30 y.o. female presenting with abdominal pain and drug problem. The history is provided by the patient and medical records.  Abdominal Pain Associated symptoms: diarrhea, hematuria, nausea, vaginal bleeding and vomiting   Associated symptoms: no chest pain, no constipation, no dysuria, no fever ( although fever last week 100.8), no shortness of  breath, no sore throat and no vaginal discharge   Drug Problem Associated symptoms include abdominal pain ( suprapubic LLQ), nausea and vomiting. Pertinent negatives include no chest pain, fever ( although fever last week 100.8), headaches, rash or sore throat.    Past Medical History  Diagnosis Date  . STD (female)     hx of chlamydia and gonorrhea  . History of PID   . Endometriosis   . Glaucoma   . Ovarian cyst   . Bartholin cyst   . Headache(784.0)   . Infection     UTI  . Vaginal Pap smear, abnormal     has not followed up  . Substance abuse    Past Surgical History  Procedure Laterality Date  . Fracture surgery      left leg  . Dilation and curettage of uterus    . Laparoscopy     Family History  Problem Relation Age of Onset  . Anesthesia problems Neg Hx   . Hypotension Neg Hx   . Malignant hyperthermia Neg Hx   . Pseudochol deficiency Neg Hx   . Alcohol abuse Neg Hx   . Diabetes Mother   . Hypertension Maternal Grandmother   . Heart disease Maternal Grandmother     great grandma   Social History  Substance Use Topics  . Smoking status: Current Every Day Smoker -- 0.25 packs/day for 7 years  . Smokeless tobacco: Never Used  . Alcohol Use: No     Comment: Denies ETOH use   OB History    Gravida Para Term Preterm AB TAB SAB Ectopic Multiple Living  4 2 1 1 2  0 2 0 0 2     Review of Systems  Constitutional: Negative for fever ( although fever last week 100.8).  HENT: Negative for sore throat.   Eyes: Negative for visual disturbance.  Respiratory: Negative for shortness of breath.   Cardiovascular: Negative for chest pain.  Gastrointestinal: Positive for nausea, vomiting, abdominal pain ( suprapubic LLQ) and diarrhea. Negative for constipation and blood in stool.  Genitourinary: Positive for hematuria, flank pain, vaginal bleeding, vaginal pain and pelvic pain. Negative for dysuria and vaginal discharge.  Skin: Negative for rash.  Neurological:  Negative for headaches.  Psychiatric/Behavioral: Positive for sleep disturbance. Negative for suicidal ideas. The patient is nervous/anxious.       Allergies  Acyclovir and related; Darvocet; Doxycycline; Flexeril; Metoclopramide; Naproxen; Latex; and Tramadol  Home Medications   Prior to Admission medications   Medication Sig Start Date End Date Taking? Authorizing Provider  acetaminophen (TYLENOL) 500 MG tablet Take 1,000 mg by mouth every 6 (six) hours as needed for moderate pain.   Yes Historical Provider, MD  HYDROcodone-acetaminophen (NORCO/VICODIN) 5-325 MG tablet Take 1-2 tablets by mouth every 4 (four) hours as needed. pain 10/20/15  Yes Historical Provider, MD  ibuprofen (ADVIL,MOTRIN) 800 MG tablet Take 1 tablet (800 mg total) by mouth 3 (three) times daily. 10/01/15  Yes Garlon Hatchet, PA-C  amoxicillin-clavulanate (AUGMENTIN) 875-125 MG tablet Take 1 tablet by mouth 2 (two) times daily. Patient not taking: Reported on 11/03/2015 10/18/15   Lona Kettle, PA-C  azithromycin (ZITHROMAX) 500 MG tablet Take 2 tablets (1,000 mg total) by mouth once. Take first 2 tablets together, then 1 every day until finished. Patient not taking: Reported on 11/03/2015 07/20/15   Duane Lope, NP  benztropine (COGENTIN) 1 MG tablet Take 1 tablet (1 mg total) by mouth 2 (two) times daily. For prevention of drug induced tremors. Patient not taking: Reported on 06/06/2015 03/30/15   Sanjuana Kava, NP  hydrOXYzine (ATARAX/VISTARIL) 25 MG tablet Take 1 tablet (25 mg total) by mouth 3 (three) times daily as needed for anxiety. Patient not taking: Reported on 06/06/2015 03/30/15   Sanjuana Kava, NP  lamoTRIgine (LAMICTAL) 25 MG tablet Take 1 tablet (25 mg total) by mouth daily. For mood stabilization Patient not taking: Reported on 11/03/2015 08/23/15   Linwood Dibbles, MD  lidocaine (XYLOCAINE) 2 % solution Use as directed 15 mLs in the mouth or throat every 6 (six) hours as needed for mouth pain. Swish and  spit every 6hours as needed. Patient not taking: Reported on 11/03/2015 10/18/15   Lona Kettle, PA-C  mirtazapine (REMERON) 7.5 MG tablet Take 1 tablet (7.5 mg total) by mouth at bedtime. For sleep Patient not taking: Reported on 06/06/2015 03/30/15   Sanjuana Kava, NP  nicotine (NICODERM CQ - DOSED IN MG/24 HOURS) 21 mg/24hr patch Place 1 patch (21 mg total) onto the skin daily. For smoking cessation Patient not taking: Reported on 06/06/2015 03/30/15   Sanjuana Kava, NP  ondansetron (ZOFRAN ODT) 4 MG disintegrating tablet Take 1 tablet (4 mg total) by mouth every 8 (eight) hours as needed for nausea or vomiting. Patient not taking: Reported on 08/23/2015 07/13/15   Duane Lope, NP  traZODone (DESYREL) 50 MG tablet Take 1 tablet (50 mg total) by mouth at bedtime. Patient not taking: Reported on 11/03/2015 08/23/15   Linwood Dibbles, MD   BP 100/67 mmHg  Pulse 84  Temp(Src) 98.2 F (36.8 C) (Oral)  Resp 16  SpO2 94%  LMP 10/31/2015  Breastfeeding? No Physical Exam  Constitutional: She appears well-developed and well-nourished. No distress.  HENT:  Head: Normocephalic and atraumatic.  Mouth/Throat: Oropharynx is clear and moist. No oropharyngeal exudate.  Eyes: Conjunctivae and EOM are normal. Pupils are equal, round, and reactive to light. Right eye exhibits no discharge. Left eye exhibits no discharge. No scleral icterus.  Neck: Normal range of motion and phonation normal. Neck supple. No rigidity. Normal range of motion present.  Cardiovascular: Normal rate, regular rhythm, normal heart sounds and intact distal pulses.   No murmur heard. Pulmonary/Chest: Effort normal and breath sounds normal. No respiratory distress.  Abdominal: Soft. Bowel sounds are normal. There is tenderness ( suprapubic). There is guarding (suprapubic) and CVA tenderness ( left sided). There is no rebound.  Genitourinary: Pelvic exam was performed with patient supine.  Chaperone present for duration of exam.  External anatomy normal - no injury, lesions, masses, or rashes. No bleeding, lesions, masses, or ulcerations in vaginal cavity. Cervix is closed with bloody discharge. No friability. Mild CMT, no adnexal tenderness. No masses palpated on bimanual .   Musculoskeletal: Normal range of motion.  Lymphadenopathy:    She has no cervical adenopathy.  Neurological: She is alert. Coordination and gait normal. GCS eye subscore is 4. GCS verbal subscore is 5. GCS motor subscore is 6.  Skin: Skin is warm and dry. She is not diaphoretic.  Psychiatric: Her mood appears anxious. Her affect is labile. She exhibits a depressed mood.  Pt is tearful during exchange    ED Course  Procedures (including critical care time) Labs Review Labs Reviewed  WET PREP, GENITAL - Abnormal; Notable for the following:    Clue Cells Wet Prep HPF POC PRESENT (*)    WBC, Wet Prep HPF POC MODERATE (*)    All other components within normal limits  COMPREHENSIVE METABOLIC PANEL - Abnormal; Notable for the following:    Calcium 8.6 (*)    Total Bilirubin 0.1 (*)    All other components within normal limits  CBC - Abnormal; Notable for the following:    HCT 35.7 (*)    Platelets 507 (*)    All other components within normal limits  URINALYSIS, ROUTINE W REFLEX MICROSCOPIC (NOT AT [facility]) - Abnormal; Notable for the following:    Hgb urine dipstick MODERATE (*)    Nitrite POSITIVE (*)    All other components within normal limits  URINE RAPID DRUG SCREEN, HOSP PERFORMED - Abnormal; Notable for the following:    Opiates POSITIVE (*)    Cocaine POSITIVE (*)    Benzodiazepines POSITIVE (*)    Tetrahydrocannabinol POSITIVE (*)    All other components within normal limits  URINE MICROSCOPIC-ADD ON - Abnormal; Notable for the following:    Squamous Epithelial / LPF 0-5 (*)    Bacteria, UA MANY (*)    All other components within normal limits  LIPASE, BLOOD  HIV ANTIBODY (ROUTINE TESTING)  RPR  I-STAT BETA HCG BLOOD, ED ([facility],  [facility], [facility] ONLY)  GC/CHLAMYDIA PROBE AMP ([place]) NOT AT [facility]    Imaging Review US Pelvis Complete  11/03/2015  CLINICAL DATA:  Several day history of left pelvic pain. EXAM: TRANSABDOMINAL ULTRASOUND OF PELVIS DOPPLER ULTRASOUND OF OVARIES TECHNIQUE: Transabdominal ultrasound examination of the pelvis was performed including evaluation of the uterus, ovaries, adnexal regions, and pelvic cul-de-sac. Color and duplex Doppler ultrasound was utilized to evaluate blood flow to the ovaries. It was necessary to  proceed with endovaginal exam following the transabdominal exam to visualize the ovaries. Color and duplex Doppler ultrasound was utilized to evaluate blood flow to the ovaries. COMPARISON:  01/14/2015 FINDINGS: Uterus Measurements: 8.6 x 4.1 x 4.8 cm. No fibroids or other mass visualized. Endometrium Thickness: 10 mm.  No focal abnormality visualized. Right ovary Measurements: 4.9 x 3.9 x 3.4 cm. Normal appearance/no adnexal mass. 3.5 cm benign appearing cyst. Left ovary Measurements: 3.4 x 2.2 x 2.4 cm. Normal appearance/no adnexal mass. Pulsed Doppler evaluation demonstrates normal low-resistance arterial and venous waveforms in both ovaries. IMPRESSION: 3.5 cm right ovarian cyst with benign characteristics. Otherwise unremarkable. Electronically Signed   By: Kennith Center M.D.   On: 11/03/2015 12:14   [place] Art/ven Flow Abd Pelv Doppler  11/03/2015  CLINICAL DATA:  Several day history of left pelvic pain. EXAM: TRANSABDOMINAL ULTRASOUND OF PELVIS DOPPLER ULTRASOUND OF OVARIES TECHNIQUE: Transabdominal ultrasound examination of the pelvis was performed including evaluation of the uterus, ovaries, adnexal regions, and pelvic cul-de-sac. Color and duplex Doppler ultrasound was utilized to evaluate blood flow to the ovaries. It was necessary to proceed with endovaginal exam following the transabdominal exam to visualize the ovaries. Color and duplex Doppler ultrasound was utilized to evaluate blood flow to  the ovaries. COMPARISON:  01/14/2015 FINDINGS: Uterus Measurements: 8.6 x 4.1 x 4.8 cm. No fibroids or other mass visualized. Endometrium Thickness: 10 mm.  No focal abnormality visualized. Right ovary Measurements: 4.9 x 3.9 x 3.4 cm. Normal appearance/no adnexal mass. 3.5 cm benign appearing cyst. Left ovary Measurements: 3.4 x 2.2 x 2.4 cm. Normal appearance/no adnexal mass. Pulsed Doppler evaluation demonstrates normal low-resistance arterial and venous waveforms in both ovaries. IMPRESSION: 3.5 cm right ovarian cyst with benign characteristics. Otherwise unremarkable. Electronically Signed   By: Kennith Center M.D.   On: 11/03/2015 12:14   I have personally reviewed and evaluated these images and lab results as part of my medical decision-making.   EKG Interpretation   Date/Time:  Tuesday November 03 2015 08:13:32 EDT Ventricular Rate:  78 PR Interval:    QRS Duration: 98 QT Interval:  394 QTC Calculation: 449 R Axis:   78 Text Interpretation:  Sinus rhythm Confirmed by Lincoln Brigham [phone]) on  11/03/2015 9:17:01 AM      MDM   Final diagnoses:  CMT (cervical motion tenderness)  Pelvic pain in female   Patient is afebrile and nontoxic. She is tearful and anxious appearing. Physical exam remarkable for left CVA tenderness and suprapubic abdominal tenderness with mild guarding. Concern for abdominal versus gynecologic pathology. Check B-hcg, CBC, CMP, lipase, UA, wet prep, UDS, HIV, RPR, GC/chlamydia. Toradol for pain. TTS consult. Due to cocaine use - check EKG.   B-hcg negative, doubt ectopic. CBC, CMP, and lipase re-assuring. BV on wet prep. UDS positive for THC, BZD, opioids, cocaine. U/A positive for nitrites. Pt declined transvaginal US. Transabdominal US remarkable for benign right ovarian cyst; negative for TOA or torsion. EKG shows NSR.   Pt endorses improvement in pain following toradol. Patient eating and drinking in ED. On repeat abdominal exam, abdomen is soft and less tender. Low  suspicion for surgical abdomen. Doubt obstruction, cholecystitis, pancreatitis, appendicitis, or diverticulitis. GC/chlamydia, HIV, RPR pending. Suspect sx etiology ?endometrosis vs. ?Cervicitis vs. ?Cystitis. Empiric tx for GC/Chlamydia given in ED. PO Metronidazole given for BV.  Per TTS notes, patient to be admitted for inpatient treatment for depression, paranoia, and substance abuse.      Lona Kettle, [place] 11/03/15 1922  Lanora Manis  Madilyn Hookees, MD 11/04/15 [id number]

## 2015-11-04 DIAGNOSIS — F332 Major depressive disorder, recurrent severe without psychotic features: Secondary | ICD-10-CM

## 2015-11-04 DIAGNOSIS — F1494 Cocaine use, unspecified with cocaine-induced mood disorder: Secondary | ICD-10-CM

## 2015-11-04 LAB — CBC WITH DIFFERENTIAL/PLATELET
BASOS PCT: 0 %
Basophils Absolute: 0 10*3/uL (ref 0.0–0.1)
EOS ABS: 0.4 10*3/uL (ref 0.0–0.7)
Eosinophils Relative: 6 %
HEMATOCRIT: 31.8 % — AB (ref 36.0–46.0)
Hemoglobin: 11 g/dL — ABNORMAL LOW (ref 12.0–15.0)
Lymphocytes Relative: 48 %
Lymphs Abs: 3.3 10*3/uL (ref 0.7–4.0)
MCH: 30.1 pg (ref 26.0–34.0)
MCHC: 34.6 g/dL (ref 30.0–36.0)
MCV: 87.1 fL (ref 78.0–100.0)
MONO ABS: 0.6 10*3/uL (ref 0.1–1.0)
MONOS PCT: 8 %
Neutro Abs: 2.6 10*3/uL (ref 1.7–7.7)
Neutrophils Relative %: 38 %
Platelets: 450 10*3/uL — ABNORMAL HIGH (ref 150–400)
RBC: 3.65 MIL/uL — ABNORMAL LOW (ref 3.87–5.11)
RDW: 14.6 % (ref 11.5–15.5)
WBC: 6.8 10*3/uL (ref 4.0–10.5)

## 2015-11-04 LAB — GC/CHLAMYDIA PROBE AMP (~~LOC~~) NOT AT ARMC
CHLAMYDIA, DNA PROBE: NEGATIVE
Neisseria Gonorrhea: NEGATIVE

## 2015-11-04 LAB — RPR: RPR: NONREACTIVE

## 2015-11-04 LAB — HIV ANTIBODY (ROUTINE TESTING W REFLEX): HIV Screen 4th Generation wRfx: NONREACTIVE

## 2015-11-04 MED ORDER — LORAZEPAM 1 MG PO TABS
1.0000 mg | ORAL_TABLET | Freq: Two times a day (BID) | ORAL | Status: AC
  Administered 2015-11-06 – 2015-11-07 (×2): 1 mg via ORAL
  Filled 2015-11-04 (×2): qty 1

## 2015-11-04 MED ORDER — ADULT MULTIVITAMIN W/MINERALS CH
1.0000 | ORAL_TABLET | Freq: Every day | ORAL | Status: DC
  Administered 2015-11-04 – 2015-11-06 (×3): 1 via ORAL
  Filled 2015-11-04 (×8): qty 1

## 2015-11-04 MED ORDER — HYDROXYZINE HCL 25 MG PO TABS
25.0000 mg | ORAL_TABLET | Freq: Four times a day (QID) | ORAL | Status: AC | PRN
  Administered 2015-11-04 – 2015-11-07 (×6): 25 mg via ORAL
  Filled 2015-11-04 (×7): qty 1

## 2015-11-04 MED ORDER — LORAZEPAM 1 MG PO TABS
1.0000 mg | ORAL_TABLET | Freq: Four times a day (QID) | ORAL | Status: AC | PRN
  Administered 2015-11-06 – 2015-11-07 (×3): 1 mg via ORAL
  Filled 2015-11-04 (×3): qty 1

## 2015-11-04 MED ORDER — VITAMIN B-1 100 MG PO TABS
100.0000 mg | ORAL_TABLET | Freq: Every day | ORAL | Status: DC
  Administered 2015-11-05 – 2015-11-06 (×2): 100 mg via ORAL
  Filled 2015-11-04 (×6): qty 1

## 2015-11-04 MED ORDER — LORAZEPAM 1 MG PO TABS
1.0000 mg | ORAL_TABLET | Freq: Three times a day (TID) | ORAL | Status: AC
  Administered 2015-11-05 – 2015-11-06 (×3): 1 mg via ORAL
  Filled 2015-11-04 (×3): qty 1

## 2015-11-04 MED ORDER — DULOXETINE HCL 20 MG PO CPEP
20.0000 mg | ORAL_CAPSULE | Freq: Every day | ORAL | Status: DC
  Administered 2015-11-05 – 2015-11-07 (×3): 20 mg via ORAL
  Filled 2015-11-04 (×5): qty 1

## 2015-11-04 MED ORDER — LORAZEPAM 1 MG PO TABS: 1.0000 mg | ORAL_TABLET | Freq: Every day | ORAL | Status: DC

## 2015-11-04 MED ORDER — LOPERAMIDE HCL 2 MG PO CAPS: 2.0000 mg | ORAL_CAPSULE | ORAL | Status: AC | PRN

## 2015-11-04 MED ORDER — ONDANSETRON 4 MG PO TBDP: 4.0000 mg | ORAL_TABLET | Freq: Four times a day (QID) | ORAL | Status: AC | PRN

## 2015-11-04 MED ORDER — ENSURE ENLIVE PO LIQD
237.0000 mL | ORAL | Status: DC
  Administered 2015-11-07: 237 mL via ORAL

## 2015-11-04 MED ORDER — GABAPENTIN 100 MG PO CAPS
100.0000 mg | ORAL_CAPSULE | Freq: Three times a day (TID) | ORAL | Status: DC
  Administered 2015-11-04 – 2015-11-07 (×10): 100 mg via ORAL
  Filled 2015-11-04 (×17): qty 1

## 2015-11-04 MED ORDER — THIAMINE HCL 100 MG/ML IJ SOLN
100.0000 mg | Freq: Once | INTRAMUSCULAR | Status: AC
  Administered 2015-11-04: 100 mg via INTRAMUSCULAR
  Filled 2015-11-04: qty 2

## 2015-11-04 MED ORDER — LORAZEPAM 1 MG PO TABS
1.0000 mg | ORAL_TABLET | Freq: Four times a day (QID) | ORAL | Status: AC
  Administered 2015-11-04 – 2015-11-05 (×4): 1 mg via ORAL
  Filled 2015-11-04 (×4): qty 1

## 2015-11-04 NOTE — Progress Notes (Signed)
Adult Psychoeducational Group Note  Date:  11/04/2015 Time:  9:07 PM  Group Topic/Focus:  Wrap-Up Group:   The focus of this group is to help patients review their daily goal of treatment and discuss progress on daily workbooks.  Participation Level:  None  Participation Quality:  Did not attend  Affect:  Did not attend  Cognitive:  Did not attend  Insight: None  Engagement in Group:  Did not attend  Modes of Intervention:  Did not attend  Additional Comments:  Patient did not attend group.  [name] 11/04/2015, 9:07 PM

## 2015-11-04 NOTE — Progress Notes (Signed)
Pt is new to the unit today for detox from opiates and cocaine.  Pt is having moderate to severe withdrawal symptoms this evening.  Pt was medicated per the clonidine protocol.  Previous shift RN reported that pt was irritable earlier, but pt is more pleasant and cooperative at this time.  Pt denies SI/HI/AVH.  She was encouraged to make her needs known to staff.  She was given a pitcher of gatorade d/t her perfuse sweating.  Support and encouragement offered.  Discharge plans are in process.  Safety maintained with q15 minute checks.

## 2015-11-04 NOTE — [facility] Suicide Risk Assessment (Signed)
[facility] Admission Suicide Risk Assessment   Nursing information obtained from:  Patient Demographic factors:  Low socioeconomic status, Unemployed Current Mental Status:  NA Loss Factors:  Financial problems / change in socioeconomic status Historical Factors:  Impulsivity, Prior suicide attempts Risk Reduction Factors:  NA  Total Time spent with patient: 45 minutes Principal Problem: Substance Induced Mood Disorder, Polysubstance Dependence, Opiate WDL  Diagnosis:   Patient Active Problem List   Diagnosis Date Noted  . MDD (major depressive disorder), recurrent, severe, with psychosis (HCC) [F33.3] 03/28/2015  . 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 Normal labor [O80, Z37.9] 12/10/2013  . Opiate dependence, continuous (HCC) [F11.20] 08/07/2013  . Cocaine abuse [F14.10] 08/07/2013  . Nausea/vomiting in pregnancy [O21.9] 07/08/2013  . Pica [F50.89] 07/08/2013  . Polysubstance dependence including opioid type drug, continuous use (HCC) [F11.20, F19.20] 06/17/2013  . PTSD (post-traumatic stress disorder) [F43.10] 06/17/2013  . Unspecified vitamin D deficiency [E55.9] 06/04/2013  . Sickle cell trait (HCC) [D57.3] 06/04/2013  . Trichomonal vaginitis in pregnancy in second trimester [O23.592, A59.01] 06/02/2013  . Polysubstance abuse [F19.10] 05/31/2013  . Tobacco use complicating pregnancy [O99.330] 05/30/2013  . Unspecified high-risk pregnancy [O09.90] 05/30/2013  . Marijuana use [F12.10] 05/27/2013  . Opioid dependence (HCC) [F11.20] 09/09/2011  . Pelvic pain in female [R10.2] 12/07/2010  . DUB (dysfunctional uterine bleeding) [N93.8] 12/07/2010  . History of PID [Z87.42] 12/07/2010     Continued Clinical Symptoms:  Alcohol Use Disorder Identification Test Final Score (AUDIT): 0 The "Alcohol Use Disorders Identification  Test", Guidelines for Use in Primary Care, Second Edition.  World Science writer [facility]). Score between 0-7:  no or low risk or alcohol related problems. Score between 8-15:  moderate risk of alcohol related problems. Score between 16-19:  high risk of alcohol related problems. Score 20 or above:  warrants further diagnostic evaluation for alcohol dependence and treatment.   CLINICAL FACTORS:   30 year old female, polysubstance dependence ( cocaine, BZDs, Opiates ) , presents depressed, labile, no SI. Reports symptoms of opiate WDL .      Psychiatric Specialty Exam: Physical Exam  ROS  Blood pressure 97/59, pulse 52, temperature 98.3 F (36.8 C), temperature source Oral, resp. rate 16, height  (1.6 m), weight 131 lb (59.421 kg), last menstrual period 10/31/2015, SpO2 100 %, not currently breastfeeding.Body mass index is 23.21 kg/(m^2).  See admit note MSE                                                   Sleep:  Number of Hours: 5.25      COGNITIVE FEATURES THAT CONTRIBUTE TO RISK:  Closed-mindedness and Loss of executive function    SUICIDE RISK:   Moderate:  Frequent suicidal ideation with limited intensity, and duration, some specificity in terms of plans, no associated intent, good self-control, limited dysphoria/symptomatology, some risk factors present, and identifiable protective factors, including available and accessible social support.  PLAN OF CARE: Patient will be admitted to inpatient psychiatric unit for stabilization and safety. Will provide and encourage milieu participation. Provide medication management and maked adjustments as needed.  Will provide medication management to minimize risk of withdrawal.Will follow daily.    I certify that inpatient services furnished can reasonably  be expected to improve the patient's condition.   Nehemiah MassedOBOS, FERNANDO, MD 11/04/2015, 4:44 PM

## 2015-11-04 NOTE — Tx Team (Signed)
Interdisciplinary Treatment Plan Update (Adult)  Date:  11/04/2015  Time Reviewed:  8:47 AM   Progress in Treatment: Attending groups: No. New to unit. Continuing to assess.  Participating in groups:  No. Taking medication as prescribed:  Yes. Tolerating medication:  Yes. Family/Significant othe contact made:   SPE required for this pt.  Patient understands diagnosis:  Yes. and As evidenced by:  seeking treatment for heroin/cocaine/thc abuse, depression, and for medication stabilization. Discussing patient identified problems/goals with staff:  Yes. Medical problems stabilized or resolved:  Yes. Denies suicidal/homicidal ideation: Yes. Issues/concerns per patient self-inventory:  Other:  Discharge Plan or Barriers: CSW assessing for appropriate referrals. During last admission 03/2015, pt was referred to [facility] ([facility]ox[facility]). Reports she has been off suboxone x1 month.   Reason for Continuation of Hospitalization: Depression Medication stabilization Withdrawal symptoms  Comments:  [name] is an 30 y.o. female. She presents to [facility] requesting help for substance abuse. Patient reports 10 months of sobriety and Suboxone treatment unitl 1 month ago. Sts that over the past month she relapsed using THC, Heroin, and Cocaine. he start of patient's substance use started yrs ago after a doctor gave her pain pills for endormetriosis. Patient sts, "I really like the euphoria of the pain pills and I couldn't stop taking them so I got on other drugs". Patient sts, "I need help..I am depressed.Marland KitchenMarland KitchenI need a structured environment.Marland KitchenMarland KitchenI want to overcome my addiction". She feels angry at herself because she was doing well for several months. Patient blames her making bad decisions recently on her drug use. She has recently started having sex without protection to obtain money for the drugs. She denies SI and HI. She has a history of self mutilating (cutting). Patient reports auditory  hallucinations of voices whispering her name. She reports seeing shadows. Patient with increased symptoms of paranoia. Sts, "I am up all night checking windows and doors due to fear that someone will come in my home. Patient paranoid that someone put hidden camera's in her home. She denies. She has a history of INPT treatment at [facility], [place], [facility], and [facility]. She was seeking outpatient mental health and substance abuse treatment at [facility]. Diagnosis: Major Depressive Disorder, Recurrent, Severe, with Psychotic Features and Polysubstance Abuse  Estimated length of stay: 3-5 days    New goal(s): to develop effective aftercare plan.   Additional Comments:  Patient and CSW reviewed pt's identified goals and treatment plan. Patient verbalized understanding and agreed to treatment plan. CSW reviewed [facility] "Discharge Process and Patient Involvement" Form. Pt verbalized understanding of information provided and signed form.    Review of initial/current patient goals per problem list:  1. Goal(s): Patient will participate in aftercare plan  Met: No  Target date: at discharge  As evidenced by: Patient will participate within aftercare plan AEB aftercare provider and housing plan at discharge being identified.  7/12: CSW assessing for appropriate referrals.   2. Goal (s): Patient will exhibit decreased depressive symptoms and suicidal ideations.  Met: No.    Target date: at discharge  As evidenced by: Patient will utilize self rating of depression at 3 or below and demonstrate decreased signs of depression or be deemed stable for discharge by MD.  7/12: Pt rates depression as 10/10 and presents with depressed mood/flat affect.   3. Goal(s): Patient will demonstrate decreased signs of withdrawal due to substance abuse  Met:No.   Target date:at discharge   As  evidenced by: Patient will produce a CIWA/COWS score of 0, have stable vitals  signs, and no symptoms of withdrawal.  7/12: Pt reports severe withdrawals with COWS of 12/stable vitals.   Attendees: Patient:   11/04/2015 8:47 AM   Family:   11/04/2015 8:47 AM   Physician:  Dr. Parke Poisson MD  11/04/2015 8:47 AM   Nursing:   Precious Gilding RN 11/04/2015 8:47 AM   Clinical Social Worker: Maxie Better, LCSW 11/04/2015 8:47 AM   Clinical Social Worker: Erasmo Downer Drinkard LCSW; Peri Maris LCSWA 11/04/2015 8:47 AM   Other:  Gerline Legacy Nurse Case Manager 11/04/2015 8:47 AM   Other:  Agustina Caroli NP  11/04/2015 8:47 AM   Other:   11/04/2015 8:47 AM   Other:  11/04/2015 8:47 AM   Other:  11/04/2015 8:47 AM   Other:  11/04/2015 8:47 AM    11/04/2015 8:47 AM    11/04/2015 8:47 AM    11/04/2015 8:47 AM    11/04/2015 8:47 AM    Scribe for Treatment Team:   Maxie Better, LCSW 11/04/2015 8:47 AM
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
## 2015-11-04 NOTE — Progress Notes (Signed)
DAR NOTE: Patient presents with anxious affect and depressed mood. Complained of back, denies auditory and visual hallucinations.  Rates depression at 6, hopelessness at 10, and anxiety at 10.  Maintained on routine safety checks.  Medications given as prescribed.  Support and encouragement offered as needed.  Stayed in the room most of the time sleeping. Pt's safety ensured with 15 minute and environmental checks. Pt currently denies SI/HI and A/V hallucinations. Pt verbally agrees to seek staff if SI/HI or A/VH occurs and to consult with staff before acting on any harmful thoughts. Will continue POC.
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
## 2015-11-04 NOTE — Progress Notes (Signed)
Recreation Therapy Notes  Date: 07.12.2017 Time: 9:30am Location: [address] Group Room   Group Topic: Stress Management  Goal Area(s) Addresses:  Patient will actively participate in stress management techniques presented during session.   Behavioral Response: Did not attend.   Marykay Lexenise L Cordarryl Monrreal, LRT/CTRS        Venna Berberich L 11/04/2015 11:41 AM

## 2015-11-04 NOTE — Progress Notes (Signed)
NUTRITION ASSESSMENT  Pt identified as at risk on the Malnutrition Screen Tool  INTERVENTION: 1. Educated patient on the importance of nutrition and encouraged intake of food and beverages. 2. Discussed weight goals. 3. Supplements: continue Ensure Enlive order but will decrease to once/day; this supplement provides 350 kcal and 20 grams   NUTRITION DIAGNOSIS: Inadequate oral intake related to poor appetite and abdominal pain PTA as evidenced by pt report.  Goal: Pt to meet >/= 90% of their estimated nutrition needs.  Monitor:  PO intake  Assessment:  Pt admitted for polysubstance abuse with request for detox from cocaine and heroin. Weight stable for at least 7 months; limited weight hx prior to that time frame.   Ensure Enlive has been ordered BID. Will decrease to once/day.   30 y.o. female  Height: Ht Readings from Last 1 Encounters:  11/03/15 5\' 3"  (1.6 m)    Weight: Wt Readings from Last 1 Encounters:  11/03/15 131 lb (59.421 kg)    Weight Hx: Wt Readings from Last 10 Encounters:  11/03/15 131 lb (59.421 kg)  10/18/15 130 lb (58.968 kg)  03/27/15 130 lb (58.968 kg)  12/31/13 145 lb (65.772 kg)  12/10/13 170 lb (77.111 kg)  08/07/13 152 lb 8 oz (69.174 kg)  06/16/13 152 lb (68.947 kg)  05/30/13 156 lb (70.761 kg)  05/23/13 158 lb 4 oz (71.782 kg)  05/03/13 150 lb (68.04 kg)    BMI:  Body mass index is 23.21 kg/(m^2). Pt meets criteria for normal weight  based on current BMI.  Estimated Nutritional Needs: Kcal: 25-30 kcal/kg Protein: > 1 gram protein/kg Fluid: 1 ml/kcal  Diet Order: Diet regular Room service appropriate?: Yes; Fluid consistency:: Thin Pt is also offered choice of unit snacks mid-morning and mid-afternoon.  Pt is eating as desired.   Lab results and medications reviewed.      Trenton GammonJessica Gwendy Boeder, MS, RD, LDN Inpatient Clinical Dietitian Pager # [phone] After hours/weekend pager # [phone]

## 2015-11-04 NOTE — [facility] Suicide Risk Assessment (Signed)
[facility] INPATIENT:  Family/Significant Other Suicide Prevention Education  Suicide Prevention Education:  Patient Refusal for Family/Significant Other Suicide Prevention Education: The patient [name] has refused to provide written consent for family/significant other to be provided Family/Significant Other Suicide Prevention Education during admission and/or prior to discharge.  Physician notified.  SPE completed with pt, as pt refused to consent to family contact. SPI pamphlet provided to pt and pt was encouraged to share information with support network, ask questions, and talk about any concerns relating to SPE. Pt denies access to guns/firearms and verbalized understanding of information provided. Mobile Crisis information also provided to pt.   Smart, Davette Nugent LCSW 11/04/2015, 3:50 PM

## 2015-11-05 LAB — URINALYSIS W MICROSCOPIC (NOT AT ARMC)
BILIRUBIN URINE: NEGATIVE
Glucose, UA: NEGATIVE mg/dL
KETONES UR: NEGATIVE mg/dL
NITRITE: NEGATIVE
PROTEIN: NEGATIVE mg/dL
Specific Gravity, Urine: 1.016 (ref 1.005–1.030)
pH: 6 (ref 5.0–8.0)

## 2015-11-05 NOTE — [facility] Group Notes (Signed)
[facility] LCSW Group Therapy  11/05/2015 11:47 AM  Type of Therapy:  Group Therapy  Participation Level:  Did Not Attend-invited. Chose to remain in bed. Pt continues to complain of withdrawals/cramping.   Modes of Intervention:  Confrontation, Discussion, Education, Exploration, Problem-solving, Rapport Building, Socialization and Support  Summary of Progress/Problems: Today's Topic: Overcoming Obstacles. Patients identified one short term goal and potential obstacles in reaching this goal. Patients processed barriers involved in overcoming these obstacles. Patients identified steps necessary for overcoming these obstacles and explored motivation (internal and external) for facing these difficulties head on.   Smart, Kathy Leven LCSW 11/05/2015, 11:47 AM

## 2015-11-05 NOTE — Progress Notes (Signed)
Pt has been in bed most of the shift.  She reports that her withdrawal symptoms are still moderate, and that she is having severe back and abdominal pain.  Pt was medicated according to her symptoms.  She denies SI/HI/AVH.  Her BP has been low, therefore pt has been encouraged to drink fluids and has been given a pitcher of gatorade.  Support and encouragement offered.  Discharge plans are in process.  Safety maintained with q15 minute checks.

## 2015-11-05 NOTE — Progress Notes (Signed)
Patient did not attend the evening karaoke group. Pt was notified that group was beginning but remained in bed.    

## 2015-11-05 NOTE — Progress Notes (Signed)
Patient ID: Kathy ParsonsSarah D [name], female   DOB: [birth year], 30 y.o.   MRN: [id number] Adult Psychoeducational Group Note  Date:  11/05/2015 Time:  09:00am   Group Topic/Focus:  Rediscovering Joy:   The focus of this group is to explore various ways to relieve stress in a positive manner.  Participation Level:  Did Not Attend  Participation Quality:    Affect:  Cognitive:   Insight:   Engagement in Group:   Modes of Intervention:    Additional Comments:  Pt in bed asleep.   Aurora Maskwyman, Mariaisabel Bodiford E 11/05/2015, 10:02 AM

## 2015-11-05 NOTE — Progress Notes (Addendum)
Nursing Note 11/05/2015 1900 - 11/06/15 0730  Data Patient continues inpatient treatment for opioid dependence/detox, cocaine-induced mood disorder.  Also being actively monitored for STI, being treated with flagyl.  Asleep on initial assessment, did not attend Karaoke group.  Complains of lower abdominal/lower back pain, anxiety, depression, and anergia.  STI symptoms unchanged per patient (abdominal pain).  States she has been in bed all day.  Affect blunted, sad, and sullen; mood depressed.  Asks "is the detox why I'm feeling so depressed today?  I just don't want to be around anybody."  Denies active SI, HI, and AVH at this time, states she can come to staff if this changes.  Action Spoke with patient 1:1, offered to be a support throughout shift.  Listened to concerns, affirmed to patient that detox can affect mood. Given ibuprofen for pain and Vistaril for anxiety. Remained on 15 minute checks for safety.  Response Patient in bed resting with eyes closed, even non-labored breathing within 30 minutes of medication administration.  Remains safe on unit.

## 2015-11-05 NOTE — Progress Notes (Signed)
Patient ID: Kathy ParsonsSarah D [name], female   DOB: 1985/07/16, 30 y.o.   MRN: [id number]  Pt currently presents with a grimacing, labile affect and guarded behavior. Pt has crying spells this morning, states "I'm just so upset, I don't want to be around anyone." Pt also reports pain in the LLQ of her abdomen, states "I'm in so much pain, my endometriosis." Pt remains in bed for most of the day, attends meals in the cafeteria. Pt walks leaning to the left. Pt reports withdrawal symptoms that include diffuse muscle and joint pain, increased agitation, stomach cramping, trending hypotension and bradycardia.   Pt provided with medications per providers orders. Pt's labs and vitals were monitored throughout the day. Pt supported emotionally and encouraged to express concerns and questions. Pt educated on medications and substance withdrawal side effects. Providers notified of pts resulting labs and pt complaints, per NP/MD we will continue to monitor patients status and focus on treating her withdrawal symptoms.   Pt's safety ensured with 15 minute and environmental checks. Pt currently denies SI/HI and A/V hallucinations. Pt verbally agrees to seek staff if SI/HI or A/VH occurs and to consult with staff before acting on any harmful thoughts. Will continue POC.

## 2015-11-05 NOTE — Progress Notes (Signed)
[facility] MD Progress Note  11/05/2015 3:56 PM [name]  MRN:  [id number]  Subjective: Doneshia reports, "I'm in a lot of pain, my back & abdominal pains". Continues to report substance withdrawal symptoms, body aches, cold/hot flashes, diaphoresis, weakness. She remains in bed. Minimal to no participation in group sessions as of yet. She says she is very uncomfortable at this time. She is presenting with symptoms of depression manifested as sad facial expressions, depressed mood, self isolation. She remains on the Duloxetine 20 mg for depression. Staff continue to monitor & provide treatment regimen as ordered.   Principal Problem: Opioid dependence (HCC), Cocaine-induced mood disorder  Diagnosis:   Patient Active Problem List   Diagnosis Date Noted  . MDD (major depressive disorder), recurrent, severe, with psychosis (HCC) [F33.3] 03/28/2015  . 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 Normal labor [O80, Z37.9] 12/10/2013  . Opiate dependence, continuous (HCC) [F11.20] 08/07/2013  . Cocaine abuse [F14.10] 08/07/2013  . Nausea/vomiting in pregnancy [O21.9] 07/08/2013  . Pica [F50.89] 07/08/2013  . Polysubstance dependence including opioid type drug, continuous use (HCC) [F11.20, F19.20] 06/17/2013  . PTSD (post-traumatic stress disorder) [F43.10] 06/17/2013  . Unspecified vitamin D deficiency [E55.9] 06/04/2013  . Sickle cell trait (HCC) [D57.3] 06/04/2013  . Trichomonal vaginitis in pregnancy in second trimester [O23.592, A59.01] 06/02/2013  . Polysubstance abuse [F19.10] 05/31/2013  . Tobacco use complicating pregnancy [O99.330] 05/30/2013  . Unspecified high-risk pregnancy [O09.90] 05/30/2013  . Marijuana use [F12.10] 05/27/2013  . Opioid dependence (HCC) [F11.20] 09/09/2011  . Pelvic pain in female [R10.2] 12/07/2010  . DUB  (dysfunctional uterine bleeding) [N93.8] 12/07/2010  . History of PID [Z87.42] 12/07/2010   Total Time spent with patient: 25 minutes  Past Psychiatric History: Polysubstance dependence problems.  Past Medical History:  Past Medical History  Diagnosis Date  . STD (female)     hx of chlamydia and gonorrhea  . History of PID   . Endometriosis   . Glaucoma   . Ovarian cyst   . Bartholin cyst   . Headache(784.0)   . Infection     UTI  . Vaginal Pap smear, abnormal     has not followed up  . Substance abuse     Past Surgical History  Procedure Laterality Date  . Fracture surgery      left leg  . Dilation and curettage of uterus    . Laparoscopy     Family History:  Family History  Problem Relation Age of Onset  . Anesthesia problems Neg Hx   . Hypotension Neg Hx   . Malignant hyperthermia Neg Hx   . Pseudochol deficiency Neg Hx   . Alcohol abuse Neg Hx   . Diabetes Mother   . Hypertension Maternal Grandmother   . Heart disease Maternal Grandmother     great grandma   Family Psychiatric  History: See H&P  Social History:  History  Alcohol Use No    Comment: Denies ETOH use     History  Drug Use  . Yes  . Special: Marijuana, Cocaine, Heroin    Comment: heroin    Social History   Social History  . Marital Status: Single    Spouse Name: N/A  . Number of Children: N/A  . Years of Education: N/A   Social History Main Topics  . Smoking status: Current Every Day Smoker -- 0.25 packs/day for  7 years  . Smokeless tobacco: Never Used  . Alcohol Use: No     Comment: Denies ETOH use  . Drug Use: Yes    Special: Marijuana, Cocaine, Heroin     Comment: heroin  . Sexual Activity: Not Asked     Comment: Pt currently [redacted] weeks pregnant   Other Topics Concern  . None   Social History Narrative   Additional Social History:    Pain Medications: SEE MAR Prescriptions: SEE MAR Over the Counter: SEE MAR History of alcohol / drug use?: Yes Longest period of  sobriety (when/how long): 10 months Negative Consequences of Use: Financial, Personal relationships Withdrawal Symptoms: Agitation, Cramps, Other (Comment), Nausea / Vomiting, Sweats, Fever / Chills Name of Substance 1: Cocaine  1 - Age of First Use: 30 yrs old  1 - Amount (size/oz): 1 gram per use 1 - Frequency: 3x's in the past 2 weeks 1 - Duration: 1 month 1 - Last Use / Amount: yesterday; 1 gram  Name of Substance 2: THC 2 - Age of First Use: 14 2 - Amount (size/oz): 1 gram 2 - Frequency: 3-4 x week 2 - Last Use / Amount: yesterday  Name of Substance 3: heroin 3 - Age of First Use: 24 3 - Amount (size/oz): 1/2 gram  3 - Frequency: daily 3 - Duration: few months 3 - Last Use / Amount: yesterday   Sleep: Fair  Appetite:  Fair  Current Medications: Current Facility-Administered Medications  Medication Dose Route Frequency Provider Last Rate Last Dose  . alum & mag hydroxide-simeth (MAALOX/MYLANTA) 200-200-20 MG/5ML suspension 30 mL  30 mL Oral Q4H PRN Thermon LeylandLaura A Davis, NP      . cloNIDine (CATAPRES) tablet 0.1 mg  0.1 mg Oral QID Thermon LeylandLaura A Davis, NP   Stopped at 11/05/15 1200   Followed by  . [START ON 11/06/2015] cloNIDine (CATAPRES) tablet 0.1 mg  0.1 mg Oral BH-qamhs Thermon LeylandLaura A Davis, NP       Followed by  . [START ON 11/08/2015] cloNIDine (CATAPRES) tablet 0.1 mg  0.1 mg Oral QAC breakfast Thermon LeylandLaura A Davis, NP      . dicyclomine (BENTYL) tablet 20 mg  20 mg Oral Q6H PRN Thermon LeylandLaura A Davis, NP   20 mg at 11/05/15 1139  . DULoxetine (CYMBALTA) DR capsule 20 mg  20 mg Oral Daily Craige CottaFernando A Cobos, MD   20 mg at 11/05/15 0832  . feeding supplement (ENSURE ENLIVE) (ENSURE ENLIVE) liquid 237 mL  237 mL Oral Q24H Craige CottaFernando A Cobos, MD   237 mL at 11/04/15 0915  . gabapentin (NEURONTIN) capsule 100 mg  100 mg Oral TID Sanjuana KavaAgnes I Nwoko, NP   100 mg at 11/05/15 1138  . hydrOXYzine (ATARAX/VISTARIL) tablet 25 mg  25 mg Oral Q6H PRN Craige CottaFernando A Cobos, MD   25 mg at 11/05/15 0618  . ibuprofen (ADVIL,MOTRIN)  tablet 600 mg  600 mg Oral Q6H PRN Thermon LeylandLaura A Davis, NP   600 mg at 11/05/15 1139  . loperamide (IMODIUM) capsule 2-4 mg  2-4 mg Oral PRN Craige CottaFernando A Cobos, MD      . LORazepam (ATIVAN) tablet 1 mg  1 mg Oral Q6H PRN Rockey SituFernando A Cobos, MD      . LORazepam (ATIVAN) tablet 1 mg  1 mg Oral TID Craige CottaFernando A Cobos, MD       Followed by  . [START ON 11/06/2015] LORazepam (ATIVAN) tablet 1 mg  1 mg Oral BID Craige CottaFernando A Cobos, MD  Followed by  . [START ON 11/08/2015] LORazepam (ATIVAN) tablet 1 mg  1 mg Oral Daily Fernando A Cobos, MD      . magnesium hydroxide (MILK OF MAGNESIA) suspension 30 mL  30 mL Oral Daily PRN Thermon Leyland, NP      . methocarbamol (ROBAXIN) tablet 500 mg  500 mg Oral Q8H PRN Thermon Leyland, NP   500 mg at 11/05/15 0837  . multivitamin with minerals tablet 1 tablet  1 tablet Oral Daily Craige Cotta, MD   1 tablet at 11/05/15 [phone]  . nicotine (NICODERM CQ - dosed in mg/24 hours) patch 14 mg  14 mg Transdermal Daily Craige Cotta, MD   14 mg at 11/05/15 0833  . ondansetron (ZOFRAN-ODT) disintegrating tablet 4 mg  4 mg Oral Q6H PRN Craige Cotta, MD      . thiamine (VITAMIN B-1) tablet 100 mg  100 mg Oral Daily Craige Cotta, MD   100 mg at 11/05/15 0832  . traZODone (DESYREL) tablet 50 mg  50 mg Oral QHS PRN,MR X 1 Kerry Hough, PA-C   50 mg at 11/04/15 2133   Lab Results:  Results for orders placed or performed during the hospital encounter of 11/03/15 (from the past 48 hour(s))  CBC with Differential/Platelet     Status: Abnormal   Collection Time: 11/04/15  6:19 PM  Result Value Ref Range   WBC 6.8 4.0 - 10.5 K/uL   RBC 3.65 (L) 3.87 - 5.11 MIL/uL   Hemoglobin 11.0 (L) 12.0 - 15.0 g/dL   HCT 96.0 (L) 45.4 - 09.8 %   MCV 87.1 78.0 - 100.0 fL   MCH 30.1 26.0 - 34.0 pg   MCHC 34.6 30.0 - 36.0 g/dL   RDW 11.9 14.7 - 82.9 %   Platelets 450 (H) 150 - 400 K/uL   Neutrophils Relative % 38 %   Neutro Abs 2.6 1.7 - 7.7 K/uL   Lymphocytes Relative 48 %   Lymphs Abs  3.3 0.7 - 4.0 K/uL   Monocytes Relative 8 %   Monocytes Absolute 0.6 0.1 - 1.0 K/uL   Eosinophils Relative 6 %   Eosinophils Absolute 0.4 0.0 - 0.7 K/uL   Basophils Relative 0 %   Basophils Absolute 0.0 0.0 - 0.1 K/uL    Comment: Performed at [facility]  Urinalysis with microscopic (not at [facility])     Status: Abnormal   Collection Time: 11/04/15  8:32 PM  Result Value Ref Range   Color, Urine YELLOW YELLOW   APPearance CLOUDY (A) CLEAR   Specific Gravity, Urine 1.016 1.005 - 1.030   pH 6.0 5.0 - 8.0   Glucose, UA NEGATIVE NEGATIVE mg/dL   Hgb urine dipstick LARGE (A) NEGATIVE   Bilirubin Urine NEGATIVE NEGATIVE   Ketones, ur NEGATIVE NEGATIVE mg/dL   Protein, ur NEGATIVE NEGATIVE mg/dL   Nitrite NEGATIVE NEGATIVE   Leukocytes, UA SMALL (A) NEGATIVE   WBC, UA 6-30 0 - 5 WBC/hpf   RBC / HPF 0-5 0 - 5 RBC/hpf   Bacteria, UA FEW (A) NONE SEEN   Squamous Epithelial / LPF TOO NUMEROUS TO COUNT (A) NONE SEEN    Comment: Performed at [facility]    Blood Alcohol level:  Lab Results  Component Value Date   [facility] <5 08/23/2015   ETH <5 03/26/2015    Metabolic Disorder Labs: Lab Results  Component Value Date   HGBA1C 5.8* 05/30/2013   MPG  120* 05/30/2013   No results found for: PROLACTIN No results found for: CHOL, TRIG, HDL, CHOLHDL, VLDL, LDLCALC  Physical Findings: AIMS: Facial and Oral Movements Muscles of Facial Expression: None, normal Lips and Perioral Area: None, normal Jaw: None, normal Tongue: None, normal,Extremity Movements Upper (arms, wrists, hands, fingers): None, normal Lower (legs, knees, ankles, toes): None, normal, Trunk Movements Neck, shoulders, hips: None, normal, Overall Severity Severity of abnormal movements (highest score from questions above): None, normal Incapacitation due to abnormal movements: None, normal Patient's awareness of abnormal movements (rate only patient's report): No Awareness, Dental  Status Current problems with teeth and/or dentures?: No Does patient usually wear dentures?: No  CIWA:  CIWA-Ar Total: 6 COWS:  COWS Total Score: 11  Musculoskeletal: Strength & Muscle Tone: within normal limits Gait & Station: normal Patient leans: N/A  Psychiatric Specialty Exam: Physical Exam  ROS  Blood pressure 107/70, pulse 72, temperature 98.4 F (36.9 C), temperature source Oral, resp. rate 16, height  (1.6 m), weight 59.421 kg (131 lb), last menstrual period 10/31/2015, SpO2 100 %, not currently breastfeeding.Body mass index is 23.21 kg/(m^2).  General Appearance: Fairly Groomed  Eye Contact: Fair  Speech: Normal Rate  Volume: Decreased  Mood: Depressed and Dysphoric  Affect: Constricted and Tearful  Thought Process: Linear  Orientation: Other: fully alert and attentive   Thought Content: describes vague hallucinations, no delusions, does not appear internally preoccupied   Suicidal Thoughts: No- denies any suicidal ideations or self injurious ideations, and contracts for safety on the unit   Homicidal Thoughts: No- denies homicidal ideations   Memory: recent and remote grossly intact   Judgement: Fair  Insight: Fair  Psychomotor Activity: mildly restless  Concentration: Concentration: Fair and Attention Span: Fair  Recall: Good  Fund of Knowledge: Good  Language: Good  Akathisia: Negative  Handed: Right  AIMS (if indicated):    Assets: Desire for Improvement Resilience  ADL's: Fair   Cognition: WNL  Sleep: Number of Hours: 6.25         Treatment Plan Summary: Daily contact with patient to assess and evaluate symptoms and progress in treatment and Medication management: 1. Continue crisis management, mood stabilization & relapse prevention.. 2. Continue current medication management to reduce current symptoms to base line and improve the  patient's overall level of functioning; Will continue the  Clonidine/Ativan detox protocols for the detox treatments, Duloxetine 20 mg for depression, Neurontin 100 mg for agitation &Trazodone 50 mg for insomnia. 3. Treat health problems as indicated. 4. Develop treatment plan to enhance medication adeherance upon discharge & prevent the need for  readmission. 5. Psycho-social education regarding relapse prevention and self care. 6. Will continue PRN treatment regimen per protocols. 7. Monitor vital signs, review pertinent findings or order labs when necessary. 8. Social Worker to work on discharge disposition  Sanjuana Kava, NP, PMHNP, FNP-BC 11/05/2015, 3:56 PM  Agree with NP Progress Note, as above

## 2015-11-06 LAB — URINE CULTURE
Culture: <10000 — AB
Special Requests: NORMAL

## 2015-11-06 NOTE — Progress Notes (Signed)
Recreation Therapy Notes  Date: 07.14.2017 Time: 9:30am Location: [address] Group Room   Group Topic: Stress Management  Goal Area(s) Addresses:  Patient will actively participate in stress management techniques presented during session.   Behavioral Response: Did not attend.   Marykay Lexenise L Lynsey Ange, LRT/CTRS         Jearl KlinefelterBlanchfield, Tegan Britain L 11/06/2015 5:51 PM

## 2015-11-06 NOTE — Progress Notes (Signed)
Patient did not attend the evening speaker AA meeting. Pt was notified that group was beginning but remained in bed.   

## 2015-11-06 NOTE — Progress Notes (Signed)
Patient ID: [name], female   DOB: [birth year], 30 y.o.   MRN: [id number] [facility] MD Progress Note  11/06/2015 3:10 PM [name]  MRN:  [id number]  Subjective: [name] reports, "I'm hurting, my back & abdomen. I feel agitated. The withdrawal symptoms are worse. I feel nauseated like I'm going to throw-up. Can I have a heat pack to put on my stomach".   Objective: [name] continues to report harsh substance withdrawal symptoms, body aches, cold/hot flashes, diaphoresis, weakness. She remains in bed. Minimal to no participation in group sessions as of yet. She says being in group & listening to all the drug use stories makes her want Percocet. She says she is very uncomfortable at this time. She is presenting with symptoms of depression manifested as sad facial expressions, depressed mood, self isolation. She remains on the Duloxetine 20 mg for depression. Staff continue to monitor & provide treatment regimen as ordered. Heat pack provided upon request. She is able to eat meals & tolerate it.  Principal Problem: Opioid dependence (HCC), Cocaine-induced mood disorder  Diagnosis:   Patient Active Problem List   Diagnosis Date Noted  . MDD (major depressive disorder), recurrent, severe, with psychosis (HCC) [F33.3] 03/28/2015  . 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 Normal labor [O80, Z37.9] 12/10/2013  . Opiate dependence, continuous (HCC) [F11.20] 08/07/2013  . Cocaine abuse [F14.10] 08/07/2013  . Nausea/vomiting in pregnancy [O21.9] 07/08/2013  . Pica [F50.89] 07/08/2013  . Polysubstance dependence including opioid type drug, continuous use (HCC) [F11.20, F19.20] 06/17/2013  . PTSD (post-traumatic stress disorder) [F43.10] 06/17/2013  . Unspecified vitamin D deficiency [E55.9] 06/04/2013  . Sickle cell trait (HCC) [D57.3]  06/04/2013  . Trichomonal vaginitis in pregnancy in second trimester [O23.592, A59.01] 06/02/2013  . Polysubstance abuse [F19.10] 05/31/2013  . Tobacco use complicating pregnancy [O99.330] 05/30/2013  . Unspecified high-risk pregnancy [O09.90] 05/30/2013  . Marijuana use [F12.10] 05/27/2013  . Opioid dependence (HCC) [F11.20] 09/09/2011  . Pelvic pain in female [R10.2] 12/07/2010  . DUB (dysfunctional uterine bleeding) [N93.8] 12/07/2010  . History of PID [Z87.42] 12/07/2010   Total Time spent with patient: 15 minutes  Past Psychiatric History: Polysubstance dependence problems.  Past Medical History:  Past Medical History  Diagnosis Date  . STD (female)     hx of chlamydia and gonorrhea  . History of PID   . Endometriosis   . Glaucoma   . Ovarian cyst   . Bartholin cyst   . Headache(784.0)   . Infection     UTI  . Vaginal Pap smear, abnormal     has not followed up  . Substance abuse     Past Surgical History  Procedure Laterality Date  . Fracture surgery      left leg  . Dilation and curettage of uterus    . Laparoscopy     Family History:  Family History  Problem Relation Age of Onset  . Anesthesia problems Neg Hx   . Hypotension Neg Hx   . Malignant hyperthermia Neg Hx   . Pseudochol deficiency Neg Hx   . Alcohol abuse Neg Hx   . Diabetes Mother   . Hypertension Maternal Grandmother   . Heart disease Maternal Grandmother     great grandma   Family Psychiatric  History: See H&P  Social History:  History  Alcohol Use No    Comment: Denies ETOH  use     History  Drug Use  . Yes  . Special: Marijuana, Cocaine, Heroin    Comment: heroin    Social History   Social History  . Marital Status: Single    Spouse Name: N/A  . Number of Children: N/A  . Years of Education: N/A   Social History Main Topics  . Smoking status: Current Every Day Smoker -- 0.25 packs/day for 7 years  . Smokeless tobacco: Never Used  . Alcohol Use: No     Comment: Denies  ETOH use  . Drug Use: Yes    Special: Marijuana, Cocaine, Heroin     Comment: heroin  . Sexual Activity: Not Asked     Comment: Pt currently [redacted] weeks pregnant   Other Topics Concern  . None   Social History Narrative   Additional Social History:    Pain Medications: SEE MAR Prescriptions: SEE MAR Over the Counter: SEE MAR History of alcohol / drug use?: Yes Longest period of sobriety (when/how long): 10 months Negative Consequences of Use: Financial, Personal relationships Withdrawal Symptoms: Agitation, Cramps, Other (Comment), Nausea / Vomiting, Sweats, Fever / Chills Name of Substance 1: Cocaine  1 - Age of First Use: 30 yrs old  1 - Amount (size/oz): 1 gram per use 1 - Frequency: 3x's in the past 2 weeks 1 - Duration: 1 month 1 - Last Use / Amount: yesterday; 1 gram  Name of Substance 2: THC 2 - Age of First Use: 14 2 - Amount (size/oz): 1 gram 2 - Frequency: 3-4 x week 2 - Last Use / Amount: yesterday  Name of Substance 3: heroin 3 - Age of First Use: 24 3 - Amount (size/oz): 1/2 gram  3 - Frequency: daily 3 - Duration: few months 3 - Last Use / Amount: yesterday   Sleep: Fair  Appetite:  Fair  Current Medications: Current Facility-Administered Medications  Medication Dose Route Frequency Provider Last Rate Last Dose  . alum & mag hydroxide-simeth (MAALOX/MYLANTA) 200-200-20 MG/5ML suspension 30 mL  30 mL Oral Q4H PRN Thermon Leyland, NP      . cloNIDine (CATAPRES) tablet 0.1 mg  0.1 mg Oral BH-qamhs Thermon Leyland, NP   0.1 mg at 11/06/15 1610   Followed by  . [START ON 11/08/2015] cloNIDine (CATAPRES) tablet 0.1 mg  0.1 mg Oral QAC breakfast Thermon Leyland, NP      . dicyclomine (BENTYL) tablet 20 mg  20 mg Oral Q6H PRN Thermon Leyland, NP   20 mg at 11/06/15 0636  . DULoxetine (CYMBALTA) DR capsule 20 mg  20 mg Oral Daily Craige Cotta, MD   20 mg at 11/06/15 0820  . feeding supplement (ENSURE ENLIVE) (ENSURE ENLIVE) liquid 237 mL  237 mL Oral Q24H Craige Cotta, MD   237 mL at 11/04/15 0915  . gabapentin (NEURONTIN) capsule 100 mg  100 mg Oral TID Sanjuana Kava, NP   100 mg at 11/06/15 1205  . hydrOXYzine (ATARAX/VISTARIL) tablet 25 mg  25 mg Oral Q6H PRN Craige Cotta, MD   25 mg at 11/06/15 0507  . ibuprofen (ADVIL,MOTRIN) tablet 600 mg  600 mg Oral Q6H PRN Thermon Leyland, NP   600 mg at 11/06/15 0820  . loperamide (IMODIUM) capsule 2-4 mg  2-4 mg Oral PRN Craige Cotta, MD      . LORazepam (ATIVAN) tablet 1 mg  1 mg Oral Q6H PRN Craige Cotta, MD      .  LORazepam (ATIVAN) tablet 1 mg  1 mg Oral BID Craige Cotta, MD       Followed by  . [START ON 11/08/2015] LORazepam (ATIVAN) tablet 1 mg  1 mg Oral Daily Marland Reine A Edgerrin Correia, MD      . magnesium hydroxide (MILK OF MAGNESIA) suspension 30 mL  30 mL Oral Daily PRN Thermon Leyland, NP      . methocarbamol (ROBAXIN) tablet 500 mg  500 mg Oral Q8H PRN Thermon Leyland, NP   500 mg at 11/05/15 2112  . multivitamin with minerals tablet 1 tablet  1 tablet Oral Daily Craige Cotta, MD   1 tablet at 11/06/15 0820  . nicotine (NICODERM CQ - dosed in mg/24 hours) patch 14 mg  14 mg Transdermal Daily Craige Cotta, MD   14 mg at 11/06/15 0823  . ondansetron (ZOFRAN-ODT) disintegrating tablet 4 mg  4 mg Oral Q6H PRN Craige Cotta, MD      . thiamine (VITAMIN B-1) tablet 100 mg  100 mg Oral Daily Craige Cotta, MD   100 mg at 11/06/15 0820  . traZODone (DESYREL) tablet 50 mg  50 mg Oral QHS PRN,MR X 1 Kerry Hough, PA-C   50 mg at 11/05/15 2112   Lab Results:  Results for orders placed or performed during the hospital encounter of 11/03/15 (from the past 48 hour(s))  CBC with Differential/Platelet     Status: Abnormal   Collection Time: 11/04/15  6:19 PM  Result Value Ref Range   WBC 6.8 4.0 - 10.5 K/uL   RBC 3.65 (L) 3.87 - 5.11 MIL/uL   Hemoglobin 11.0 (L) 12.0 - 15.0 g/dL   HCT 95.2 (L) 84.1 - 32.4 %   MCV 87.1 78.0 - 100.0 fL   MCH 30.1 26.0 - 34.0 pg   MCHC 34.6 30.0 - 36.0 g/dL    RDW 40.1 02.7 - 25.3 %   Platelets 450 (H) 150 - 400 K/uL   Neutrophils Relative % 38 %   Neutro Abs 2.6 1.7 - 7.7 K/uL   Lymphocytes Relative 48 %   Lymphs Abs 3.3 0.7 - 4.0 K/uL   Monocytes Relative 8 %   Monocytes Absolute 0.6 0.1 - 1.0 K/uL   Eosinophils Relative 6 %   Eosinophils Absolute 0.4 0.0 - 0.7 K/uL   Basophils Relative 0 %   Basophils Absolute 0.0 0.0 - 0.1 K/uL    Comment: Performed at [facility]  Urinalysis with microscopic (not at [facility])     Status: Abnormal   Collection Time: 11/04/15  8:32 PM  Result Value Ref Range   Color, Urine YELLOW YELLOW   APPearance CLOUDY (A) CLEAR   Specific Gravity, Urine 1.016 1.005 - 1.030   pH 6.0 5.0 - 8.0   Glucose, UA NEGATIVE NEGATIVE mg/dL   Hgb urine dipstick LARGE (A) NEGATIVE   Bilirubin Urine NEGATIVE NEGATIVE   Ketones, ur NEGATIVE NEGATIVE mg/dL   Protein, ur NEGATIVE NEGATIVE mg/dL   Nitrite NEGATIVE NEGATIVE   Leukocytes, UA SMALL (A) NEGATIVE   WBC, UA 6-30 0 - 5 WBC/hpf   RBC / HPF 0-5 0 - 5 RBC/hpf   Bacteria, UA FEW (A) NONE SEEN   Squamous Epithelial / LPF TOO NUMEROUS TO COUNT (A) NONE SEEN    Comment: Performed at [facility]  Urine culture     Status: Abnormal   Collection Time: 11/04/15  8:32 PM  Result Value Ref Range  Specimen Description      URINE, RANDOM Performed at [facility]    Special Requests      Normal Performed at [facility]    Culture (A)     <10,000 COLONIES/mL INSIGNIFICANT GROWTH Performed at [facility]    Report Status 11/06/2015 FINAL     Blood Alcohol level:  Lab Results  Component Value Date   [facility] <5 08/23/2015   ETH <5 03/26/2015    Metabolic Disorder Labs: Lab Results  Component Value Date   HGBA1C 5.8* 05/30/2013   MPG 120* 05/30/2013   No results found for: PROLACTIN No results found for: CHOL, TRIG, HDL, CHOLHDL, VLDL, LDLCALC  Physical Findings: AIMS: Facial and  Oral Movements Muscles of Facial Expression: None, normal Lips and Perioral Area: None, normal Jaw: None, normal Tongue: None, normal,Extremity Movements Upper (arms, wrists, hands, fingers): None, normal Lower (legs, knees, ankles, toes): None, normal, Trunk Movements Neck, shoulders, hips: None, normal, Overall Severity Severity of abnormal movements (highest score from questions above): None, normal Incapacitation due to abnormal movements: None, normal Patient's awareness of abnormal movements (rate only patient's report): No Awareness, Dental Status Current problems with teeth and/or dentures?: No Does patient usually wear dentures?: No  CIWA:  CIWA-Ar Total: 3 COWS:  COWS Total Score: 4  Musculoskeletal: Strength & Muscle Tone: within normal limits Gait & Station: normal Patient leans: N/A  Psychiatric Specialty Exam: Physical Exam  ROS  Blood pressure 107/73, pulse 60, temperature 98.6 F (37 C), temperature source Oral, resp. rate 16, height 5\' 3"  (1.6 m), weight 59.421 kg (131 lb), last menstrual period 10/31/2015, SpO2 100 %, not currently breastfeeding.Body mass index is 23.21 kg/(m^2).  General Appearance: Fairly Groomed  Eye Contact: Fair  Speech: Normal Rate  Volume: Decreased  Mood: Depressed and Dysphoric  Affect: Constricted and Tearful  Thought Process: Linear  Orientation: Other: fully alert and attentive   Thought Content: describes vague hallucinations, no delusions, does not appear internally preoccupied   Suicidal Thoughts: No- denies any suicidal ideations or self injurious ideations, and contracts for safety on the unit   Homicidal Thoughts: No- denies homicidal ideations   Memory: recent and remote grossly intact   Judgement: Fair  Insight: Fair  Psychomotor Activity: mildly restless  Concentration: Concentration: Fair and Attention Span: Fair  Recall: Good  Fund of Knowledge: Good  Language: Good   Akathisia: Negative  Handed: Right  AIMS (if indicated):    Assets: Desire for Improvement Resilience  ADL's: Fair   Cognition: WNL  Sleep: Number of Hours: 6.25         Treatment Plan Summary: Daily contact with patient to assess and evaluate symptoms and progress in treatment and Medication management: 1. Continue crisis management, mood stabilization & relapse prevention.. 2. Continue current medication management to reduce current symptoms to base line and improve the  patient's overall level of functioning; Will continue the Clonidine/Ativan detox protocols for the detox treatments, Duloxetine 20 mg for depression, Neurontin 100 mg for agitation &Trazodone 50 mg for insomnia. 3. Treat health problems as indicated, heat pack provided for abdominal pain. 4. Develop treatment plan to enhance medication adeherance upon discharge & prevent the need for  readmission. 5. Psycho-social education regarding relapse prevention and self care. 6. Will continue PRN treatment regimen per protocols. 7. Monitor vital signs, review pertinent findings or order labs when necessary. 8. Social Worker to work on discharge disposition  Sanjuana Kava, NP, PMHNP, FNP-BC 11/06/2015, 3:10  PM Agree with NP Progress Note, as above

## 2015-11-06 NOTE — Progress Notes (Signed)
DAR NOTE: Patient presents with anxious affect and depressed mood. Pt has been in the most of the day, complained abd pain. Pt was agitated this morning because she was woken up for V/S checks. Pt also refused to fill in the self inventory form. Pt denies SI,auditory and visual hallucinations.  Maintained on routine safety checks.  Medications given as prescribed.  Support and encouragement offered as needed. We will continue to monitor.

## 2015-11-06 NOTE — [facility] Group Notes (Signed)
[facility] LCSW Group Therapy  11/06/2015 12:44 PM  Type of Therapy:  Group Therapy  Participation Level:  Did Not Attend-pt invited. Chose to rest in bed.   Modes of Intervention:  Confrontation, Discussion, Education, Exploration, Problem-solving, Rapport Building, Socialization and Support  Summary of Progress/Problems: Feelings around Relapse. Group members discussed the meaning of relapse and shared personal stories of relapse, how it affected them and others, and how they perceived themselves during this time. Group members were encouraged to identify triggers, warning signs and coping skills used when facing the possibility of relapse. Social supports were discussed and explored in detail. Post Acute Withdrawal Syndrome (handout provided) was introduced and examined. Pt's were encouraged to ask questions, talk about key points associated with PAWS, and process this information in terms of relapse prevention.   Smart, Kathy Smola LCSW 11/06/2015, 12:44 PM

## 2015-11-06 NOTE — [facility] Group Notes (Signed)
[facility]   11/06/2015 9:23 AM  Participation Quality:  Invited. DID NOT ATTEND. Pt chose to rest in room.   Smart, Lylee Corrow LCSW

## 2015-11-07 DIAGNOSIS — F1120 Opioid dependence, uncomplicated: Secondary | ICD-10-CM

## 2015-11-07 MED ORDER — LORAZEPAM 0.5 MG PO TABS
0.5000 mg | ORAL_TABLET | Freq: Four times a day (QID) | ORAL | Status: DC | PRN
  Administered 2015-11-07 – 2015-11-08 (×3): 0.5 mg via ORAL
  Filled 2015-11-07 (×3): qty 1

## 2015-11-07 MED ORDER — DULOXETINE HCL 30 MG PO CPEP
30.0000 mg | ORAL_CAPSULE | Freq: Every day | ORAL | Status: DC
  Administered 2015-11-08: 30 mg via ORAL
  Filled 2015-11-07 (×3): qty 1

## 2015-11-07 MED ORDER — GABAPENTIN 100 MG PO CAPS
200.0000 mg | ORAL_CAPSULE | Freq: Three times a day (TID) | ORAL | Status: DC
  Administered 2015-11-07 – 2015-11-08 (×3): 200 mg via ORAL
  Filled 2015-11-07 (×8): qty 2

## 2015-11-07 MED ORDER — TRAZODONE HCL 100 MG PO TABS
100.0000 mg | ORAL_TABLET | Freq: Every evening | ORAL | Status: DC | PRN
  Administered 2015-11-07 – 2015-11-08 (×2): 100 mg via ORAL
  Filled 2015-11-07 (×2): qty 1

## 2015-11-07 MED ORDER — HYDROXYZINE HCL 50 MG PO TABS
50.0000 mg | ORAL_TABLET | Freq: Four times a day (QID) | ORAL | Status: DC | PRN
  Administered 2015-11-07 – 2015-11-08 (×3): 50 mg via ORAL
  Filled 2015-11-07 (×2): qty 1

## 2015-11-07 MED ORDER — METRONIDAZOLE 250 MG PO TABS
500.0000 mg | ORAL_TABLET | Freq: Two times a day (BID) | ORAL | Status: DC
  Administered 2015-11-07 – 2015-11-08 (×2): 500 mg via ORAL
  Filled 2015-11-07: qty 2
  Filled 2015-11-07: qty 1
  Filled 2015-11-07 (×4): qty 2
  Filled 2015-11-07: qty 1

## 2015-11-07 MED ORDER — SULFAMETHOXAZOLE-TRIMETHOPRIM 800-160 MG PO TABS
1.0000 | ORAL_TABLET | Freq: Two times a day (BID) | ORAL | Status: DC
  Administered 2015-11-07 – 2015-11-08 (×3): 1 via ORAL
  Filled 2015-11-07 (×7): qty 1

## 2015-11-07 NOTE — [facility] Group Notes (Signed)
  [facility] LCSW Group Therapy Note  11/07/2015 at 10:00 AM  Type of Therapy and Topic:  Group Therapy: Avoiding Self-Sabotaging and Enabling Behaviors  Participation Level:  Did Not Attend despite overhead announcement and CSW going to patient's room and encouraging her to attend   Carney Bernatherine C Javarri Segal, LCSW

## 2015-11-07 NOTE — Progress Notes (Signed)
Patient has been up in the dayroom wearing tight revealing clothes being loud and trying to get the attention of some of the males in the dayroom. She is constantly asking for medication for anxiety but is later seen in the dayroom flirting and laughing or up at the front desk. She reports that she is leaving on tomorrow. She denies si/hi/a/v hallucinations. She does not seem serious about her treatment. Safety maintained on unit with 15 min checks.

## 2015-11-07 NOTE — Progress Notes (Signed)
Patient did attend the evening speaker AA meeting.  

## 2015-11-07 NOTE — Progress Notes (Signed)
D: Patient's self inventory sheet: patient has poor sleep, recieved sleep medication with no relief.fair  Appetite, normal energy level, good concentration. Rated depression 4/10, hopeless 4/10, anxiety 9/10. SI/HI/AVH: denies SI and HI but endorses severe anger at daughter's father, agitation, anxiety, irritability and sensitivity to noise and sound. Physical complaints are endometriosis pain. Goal is "staying to myself". Plans to work on "I want to go home". Requesting to speak with provider again about anti anxiety medication, patient does not feel that she is getting enough. When talking about pain, patient stated that she wanted "roxi, norco, vicodin, percocet, all the good pain meds:.  A: Medications administered, assessed medication knowledge and education given on medication regimen.  Emotional support and encouragement given patient. R: Denies SI and HI , contracts for safety. Safety maintained with 15 minute checks.

## 2015-11-07 NOTE — Progress Notes (Signed)
Writer spoke with patient 1:1 and she was very anxious and restless. She was concerned b/c she had not been able to get in touch with her baby's father who is keeping their daughter while she is here. She reports being afraid that something has happened. She also reports feeling very irritable and anxious today and decided it was best she stayed in her room. Writer encouraged her not over think the situation and try calling later before the phones are off. Support given and safety maintained on unit with 15 min checks.

## 2015-11-07 NOTE — Progress Notes (Signed)
[facility] MD Progress Note  11/07/2015 11:25 AM [name]  MRN:  [id number]  Subjective: Patient reports." I feel agitated and I just want to rest. I just found out news about my kids."  Objective: [name] is awake, alert and oriented X4. seen sitting in the dayroom talking very loudly on the phone. Patient was redirected to lower her voice. Patient states " he's making me mad." Patient denies group session.  Denies suicidal or homicidal ideation. Denies auditory or visual hallucination and does not appear to be responding to internal stimuli. Staff was able to deescalate patient. Patient reports she is medication compliant without mediation side effects. Report learning new coping skills. States her depression 7/10. Patient states "am not having a good day today." (patient is answering question through head nodding.)Reports good appetite and reports she is not resting well. Support, encouragement and reassurance was provided.   Principal Problem: Opioid dependence (HCC), Cocaine-induced mood disorder  Diagnosis:   Patient Active Problem List   Diagnosis Date Noted  . MDD (major depressive disorder), recurrent, severe, with psychosis (HCC) [F33.3] 03/28/2015  . 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 Normal labor [O80, Z37.9] 12/10/2013  . Opiate dependence, continuous (HCC) [F11.20] 08/07/2013  . Cocaine abuse [F14.10] 08/07/2013  . Nausea/vomiting in pregnancy [O21.9] 07/08/2013  . Pica [F50.89] 07/08/2013  . Polysubstance dependence including opioid type drug, continuous use (HCC) [F11.20, F19.20] 06/17/2013  . PTSD (post-traumatic stress disorder) [F43.10] 06/17/2013  . Unspecified vitamin D deficiency [E55.9] 06/04/2013  . Sickle cell trait (HCC) [D57.3] 06/04/2013  . Trichomonal vaginitis in pregnancy in second  trimester [O23.592, A59.01] 06/02/2013  . Polysubstance abuse [F19.10] 05/31/2013  . Tobacco use complicating pregnancy [O99.330] 05/30/2013  . Unspecified high-risk pregnancy [O09.90] 05/30/2013  . Marijuana use [F12.10] 05/27/2013  . Opioid dependence (HCC) [F11.20] 09/09/2011  . Pelvic pain in female [R10.2] 12/07/2010  . DUB (dysfunctional uterine bleeding) [N93.8] 12/07/2010  . History of PID [Z87.42] 12/07/2010   Total Time spent with patient: 15 minutes  Past Psychiatric History: Polysubstance dependence problems.  Past Medical History:  Past Medical History  Diagnosis Date  . STD (female)     hx of chlamydia and gonorrhea  . History of PID   . Endometriosis   . Glaucoma   . Ovarian cyst   . Bartholin cyst   . Headache(784.0)   . Infection     UTI  . Vaginal Pap smear, abnormal     has not followed up  . Substance abuse     Past Surgical History  Procedure Laterality Date  . Fracture surgery      left leg  . Dilation and curettage of uterus    . Laparoscopy     Family History:  Family History  Problem Relation Age of Onset  . Anesthesia problems Neg Hx   . Hypotension Neg Hx   . Malignant hyperthermia Neg Hx   . Pseudochol deficiency Neg Hx   . Alcohol abuse Neg Hx   . Diabetes Mother   . Hypertension Maternal Grandmother   . Heart disease Maternal Grandmother     great grandma   Family Psychiatric  History: See H&P  Social History:  History  Alcohol Use No    Comment: Denies ETOH use     History  Drug Use  . Yes  . Special: Marijuana, Cocaine, Heroin  Comment: heroin    Social History   Social History  . Marital Status: Single    Spouse Name: N/A  . Number of Children: N/A  . Years of Education: N/A   Social History Main Topics  . Smoking status: Current Every Day Smoker -- 0.25 packs/day for 7 years  . Smokeless tobacco: Never Used  . Alcohol Use: No     Comment: Denies ETOH use  . Drug Use: Yes    Special: Marijuana, Cocaine,  Heroin     Comment: heroin  . Sexual Activity: Not Asked     Comment: Pt currently [redacted] weeks pregnant   Other Topics Concern  . None   Social History Narrative   Additional Social History:    Pain Medications: SEE MAR Prescriptions: SEE MAR Over the Counter: SEE MAR History of alcohol / drug use?: Yes Longest period of sobriety (when/how long): 10 months Negative Consequences of Use: Financial, Personal relationships Withdrawal Symptoms: Agitation, Cramps, Other (Comment), Nausea / Vomiting, Sweats, Fever / Chills Name of Substance 1: Cocaine  1 - Age of First Use: 30 yrs old  1 - Amount (size/oz): 1 gram per use 1 - Frequency: 3x's in the past 2 weeks 1 - Duration: 1 month 1 - Last Use / Amount: yesterday; 1 gram  Name of Substance 2: THC 2 - Age of First Use: 14 2 - Amount (size/oz): 1 gram 2 - Frequency: 3-4 x week 2 - Last Use / Amount: yesterday  Name of Substance 3: heroin 3 - Age of First Use: 24 3 - Amount (size/oz): 1/2 gram  3 - Frequency: daily 3 - Duration: few months 3 - Last Use / Amount: yesterday   Sleep: Fair  Appetite:  Fair  Current Medications: Current Facility-Administered Medications  Medication Dose Route Frequency Provider Last Rate Last Dose  . alum & mag hydroxide-simeth (MAALOX/MYLANTA) 200-200-20 MG/5ML suspension 30 mL  30 mL Oral Q4H PRN Thermon LeylandLaura A Davis, NP      . cloNIDine (CATAPRES) tablet 0.1 mg  0.1 mg Oral BH-qamhs Thermon LeylandLaura A Davis, NP   0.1 mg at 11/07/15 0845   Followed by  . [START ON 11/08/2015] cloNIDine (CATAPRES) tablet 0.1 mg  0.1 mg Oral QAC breakfast Thermon LeylandLaura A Davis, NP      . dicyclomine (BENTYL) tablet 20 mg  20 mg Oral Q6H PRN Thermon LeylandLaura A Davis, NP   20 mg at 11/07/15 [id number]  . DULoxetine (CYMBALTA) DR capsule 20 mg  20 mg Oral Daily Craige CottaFernando A Cobos, MD   20 mg at 11/07/15 0844  . feeding supplement (ENSURE ENLIVE) (ENSURE ENLIVE) liquid 237 mL  237 mL Oral Q24H Rockey SituFernando A Cobos, MD   237 mL at 11/07/15 0930  . gabapentin  (NEURONTIN) capsule 100 mg  100 mg Oral TID Sanjuana KavaAgnes I Nwoko, NP   100 mg at 11/07/15 0844  . hydrOXYzine (ATARAX/VISTARIL) tablet 25 mg  25 mg Oral Q6H PRN Craige CottaFernando A Cobos, MD   25 mg at 11/07/15 0954  . ibuprofen (ADVIL,MOTRIN) tablet 600 mg  600 mg Oral Q6H PRN Thermon LeylandLaura A Davis, NP   600 mg at 11/06/15 0820  . loperamide (IMODIUM) capsule 2-4 mg  2-4 mg Oral PRN Craige CottaFernando A Cobos, MD      . LORazepam (ATIVAN) tablet 1 mg  1 mg Oral Q6H PRN Craige CottaFernando A Cobos, MD   1 mg at 11/07/15 [id number]  . [START ON 11/08/2015] LORazepam (ATIVAN) tablet 1 mg  1 mg Oral Daily Madaline GuthrieFernando A  Cobos, MD      . magnesium hydroxide (MILK OF MAGNESIA) suspension 30 mL  30 mL Oral Daily PRN Thermon Leyland, NP      . methocarbamol (ROBAXIN) tablet 500 mg  500 mg Oral Q8H PRN Thermon Leyland, NP   500 mg at 11/05/15 2112  . multivitamin with minerals tablet 1 tablet  1 tablet Oral Daily Craige Cotta, MD   1 tablet at 11/06/15 0820  . nicotine (NICODERM CQ - dosed in mg/24 hours) patch 14 mg  14 mg Transdermal Daily Craige Cotta, MD   14 mg at 11/06/15 0823  . ondansetron (ZOFRAN-ODT) disintegrating tablet 4 mg  4 mg Oral Q6H PRN Craige Cotta, MD      . thiamine (VITAMIN B-1) tablet 100 mg  100 mg Oral Daily Craige Cotta, MD   100 mg at 11/06/15 0820  . traZODone (DESYREL) tablet 50 mg  50 mg Oral QHS PRN,MR X 1 Kerry Hough, PA-C   50 mg at 11/06/15 2127   Lab Results:  No results found for this or any previous visit (from the past 48 hour(s)).  Blood Alcohol level:  Lab Results  Component Value Date   ETH <5 08/23/2015   ETH <5 03/26/2015    Metabolic Disorder Labs: Lab Results  Component Value Date   HGBA1C 5.8* 05/30/2013   MPG 120* 05/30/2013   No results found for: PROLACTIN No results found for: CHOL, TRIG, HDL, CHOLHDL, VLDL, LDLCALC  Physical Findings: AIMS: Facial and Oral Movements Muscles of Facial Expression: None, normal Lips and Perioral Area: None, normal Jaw: None, normal Tongue:  None, normal,Extremity Movements Upper (arms, wrists, hands, fingers): None, normal Lower (legs, knees, ankles, toes): None, normal, Trunk Movements Neck, shoulders, hips: None, normal, Overall Severity Severity of abnormal movements (highest score from questions above): None, normal Incapacitation due to abnormal movements: None, normal Patient's awareness of abnormal movements (rate only patient's report): No Awareness, Dental Status Current problems with teeth and/or dentures?: No Does patient usually wear dentures?: No  CIWA:  CIWA-Ar Total: 0 COWS:  COWS Total Score: 5  Musculoskeletal: Strength & Muscle Tone: within normal limits Gait & Station: normal Patient leans: N/A  Psychiatric Specialty Exam: Physical Exam  Nursing note and vitals reviewed. Constitutional: She is oriented to person, place, and time. She appears well-developed.  Cardiovascular: Intact distal pulses.   Musculoskeletal: Normal range of motion.  Neurological: She is alert and oriented to person, place, and time.  Psychiatric: She has a normal mood and affect. Her behavior is normal.    Review of Systems  Gastrointestinal: Positive for abdominal pain.  Psychiatric/Behavioral: Positive for depression and suicidal ideas (passive). The patient is nervous/anxious and has insomnia.   All other systems reviewed and are negative.   Blood pressure 111/69, pulse 62, temperature 99.1 F (37.3 C), temperature source Oral, resp. rate 16, height  (1.6 m), weight 59.421 kg (131 lb), last menstrual period 10/31/2015, SpO2 100 %, not currently breastfeeding.Body mass index is 23.21 kg/(m^2).  General Appearance: Casual   Eye Contact: Fair  Speech: Normal Rate  Volume: Decreased  Mood: Depressed and Dysphoric  Affect: Constricted and Tearful  Thought Process: Linear  Orientation: Other: fully alert and attentive   Thought Content: describes vague hallucinations, no delusions, does not appear  internally preoccupied   Suicidal Thoughts: No- denies any suicidal ideations or self injurious ideations, and contracts for safety on the unit   Homicidal Thoughts: No- denies  homicidal ideations   Memory: recent and remote grossly intact   Judgement: Fair  Insight: Fair  Psychomotor Activity: mildly restless  Concentration: Concentration: Fair and Attention Span: Fair  Recall: Good  Fund of Knowledge: Good  Language: Good  Akathisia: Negative  Handed: Right  AIMS (if indicated):    Assets: Desire for Improvement Resilience  ADL's: Fair   Cognition: WNL  Sleep: Number of Hours: 6.25          I agree with current treatment plan on 11/07/2015, Patient seen face-to-face for psychiatric evaluation follow-up, chart reviewed. Reviewed the information documented and agree with the treatment plan.  Treatment Plan Summary: Daily contact with patient to assess and evaluate symptoms and progress in treatment and Medication management:  1. Continue crisis management, mood stabilization & relapse prevention.. 2. Continue current medication management to reduce current symptoms to base line and improve the  patient's overall level of functioning;  Continue the Clonidine/Ativan detox protocols for the detox treatments,  Increase  Duloxetine 20 mg to 30mg  for depression Increase  Neurontin 100 mg to 200mg  for agitation & Continue Trazodone 100 mg for insomnia. Start Bactrium 160/80mg  PO BID X 3 days for UTI Start Metronidazole 500 mg PO BID for 7 days for Bacterial vaginosis Encourage hydration for B/P 3. Treat health problems as indicated, heat pack provided for abdominal pain. 4. Develop treatment plan to enhance medication adeherance upon discharge & prevent the need for  readmission. 5. Psycho-social education regarding relapse prevention and self care. 6. Will continue PRN treatment regimen per protocols. 7. Monitor vital signs, review pertinent  findings or order labs when necessary. 8. Social Worker to work on discharge disposition  Oneta Rack, NP, 11/07/2015, 11:25 AM Agree with NP Progress Note, as above

## 2015-11-08 MED ORDER — TRAZODONE HCL 100 MG PO TABS: 100.0000 mg | ORAL_TABLET | Freq: Every evening | ORAL | Status: DC | PRN

## 2015-11-08 MED ORDER — DULOXETINE HCL 30 MG PO CPEP: 30.0000 mg | ORAL_CAPSULE | Freq: Every day | ORAL | Status: DC

## 2015-11-08 MED ORDER — METRONIDAZOLE 500 MG PO TABS: 500.0000 mg | ORAL_TABLET | Freq: Two times a day (BID) | ORAL | Status: DC

## 2015-11-08 MED ORDER — SULFAMETHOXAZOLE-TRIMETHOPRIM 800-160 MG PO TABS: 1.0000 | ORAL_TABLET | Freq: Two times a day (BID) | ORAL | Status: DC

## 2015-11-08 MED ORDER — NICOTINE 14 MG/24HR TD PT24: 14.0000 mg | MEDICATED_PATCH | Freq: Every day | TRANSDERMAL | Status: DC

## 2015-11-08 MED ORDER — GABAPENTIN 100 MG PO CAPS: 200.0000 mg | ORAL_CAPSULE | Freq: Three times a day (TID) | ORAL | Status: DC

## 2015-11-08 NOTE — [facility] Group Notes (Signed)
Did not attend, refused group 

## 2015-11-08 NOTE — Discharge Summary (Signed)
Physician Discharge Summary Note  Patient:  [name] is an 30 y.o., female MRN:  [id number] DOB:  Mar 17, 1986 Patient phone:  [phone] (home)  Patient address:   [address] [place] [address],  Total Time spent with patient: 30 minutes  Date of Admission:  11/03/2015 Date of Discharge:  11/08/2015  Reason for Admission: Per H&P 30 year female. Presented to the ED due to pelvic, abdominal pain. Also reported history of substance dependence and requested help with treatment for these conditions .History of polysubstance dependence And of depression . She had been using crack cocaine regularly, and had also been abusing opiate analgesics, mostly percocets, which she was procuring on the street. States she was taking variable amounts depending on availability. Recently she had also " snorted some heroin".Denies IVDA .Also reports been abusing Xanax " sometimes 4 bars a day". Patient states " I was doing OK" on Suboxone maintenance, and was functioning better in her daily activities, but relapsed a few weeks ago, and stopped Suboxone about three weeks ago.Endorses symptoms of opiate withdrawal - discomfort , cramps, mainly pelvic,nausea, no vomiting , (+) diarrhea .Endorses depression, with neuro-vegetative symptoms as below, but denies any suicidal ideations .Reports occasional auditory hallucinations, described as whispers or dogs barking, but does not appear internally preoccupied at this time.   Principal Problem: Cocaine-induced mood disorder [facility]) Discharge Diagnoses: Patient Active Problem List   Diagnosis Date Noted  . MDD (major depressive disorder), recurrent, severe, with psychosis (HCC) [F33.3] 03/28/2015  . 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 Normal labor [O80, Z37.9] 12/10/2013  . Opiate  dependence, continuous (HCC) [F11.20] 08/07/2013  . Cocaine abuse [F14.10] 08/07/2013  . Nausea/vomiting in pregnancy [O21.9] 07/08/2013  . Pica [F50.89] 07/08/2013  . Polysubstance dependence including opioid type drug, continuous use (HCC) [F11.20, F19.20] 06/17/2013  . PTSD (post-traumatic stress disorder) [F43.10] 06/17/2013  . Unspecified vitamin D deficiency [E55.9] 06/04/2013  . Sickle cell trait (HCC) [D57.3] 06/04/2013  . Trichomonal vaginitis in pregnancy in second trimester [O23.592, A59.01] 06/02/2013  . Polysubstance abuse [F19.10] 05/31/2013  . Tobacco use complicating pregnancy [O99.330] 05/30/2013  . Unspecified high-risk pregnancy [O09.90] 05/30/2013  . Marijuana use [F12.10] 05/27/2013  . Opioid dependence (HCC) [F11.20] 09/09/2011  . Pelvic pain in female [R10.2] 12/07/2010  . DUB (dysfunctional uterine bleeding) [N93.8] 12/07/2010  . History of PID [Z87.42] 12/07/2010    Past Psychiatric History: See Above  Past Medical History:  Past Medical History  Diagnosis Date  . STD (female)     hx of chlamydia and gonorrhea  . History of PID   . Endometriosis   . Glaucoma   . Ovarian cyst   . Bartholin cyst   . Headache(784.0)   . Infection     UTI  . Vaginal Pap smear, abnormal     has not followed up  . Substance abuse     Past Surgical History  Procedure Laterality Date  . Fracture surgery      left leg  . Dilation and curettage of uterus    . Laparoscopy     Family History:  Family History  Problem Relation Age of Onset  . Anesthesia problems Neg Hx   . Hypotension Neg Hx   . Malignant hyperthermia Neg Hx   . Pseudochol deficiency Neg Hx   . Alcohol abuse Neg Hx   . Diabetes Mother   .  Hypertension Maternal Grandmother   . Heart disease Maternal Grandmother     great grandma   Family Psychiatric  History: See Above Social History:  History  Alcohol Use No    Comment: Denies ETOH use     History  Drug Use  . Yes  . Special: Marijuana,  Cocaine, Heroin    Comment: heroin    Social History   Social History  . Marital Status: Single    Spouse Name: N/A  . Number of Children: N/A  . Years of Education: N/A   Social History Main Topics  . Smoking status: Current Every Day Smoker -- 0.25 packs/day for 7 years  . Smokeless tobacco: Never Used  . Alcohol Use: No     Comment: Denies ETOH use  . Drug Use: Yes    Special: Marijuana, Cocaine, Heroin     Comment: heroin  . Sexual Activity: Not Asked     Comment: Pt currently [redacted] weeks pregnant   Other Topics Concern  . None   Social History Narrative    Hospital Course:  [name] was admitted for Cocaine-induced mood disorder [facility]) and crisis management.  Pt was treated discharged with the medications listed below under Medication List.  Medical problems were identified and treated as needed.  Home medications were restarted as appropriate.  Improvement was monitored by observation and [name] 's daily report of symptom reduction.  Emotional and mental status was monitored by daily self-inventory reports completed by [name] and clinical staff.         [name] was evaluated by the treatment team for stability and plans for continued recovery upon discharge. [name] 's motivation was an integral factor for scheduling further treatment. Employment, transportation, bed availability, health status, family support, and any pending legal issues were also considered during hospital stay. Pt was offered further treatment options upon discharge including but not limited to Residential, Intensive Outpatient, and Outpatient treatment.  [name] will follow up with the services as listed below under Follow Up Information.     Upon completion of this admission the patient was both mentally and medically stable for discharge denying suicidal/homicidal ideation, auditory/visual/tactile hallucinations, delusional thoughts and paranoia. Patient  reports she is not interested in treatment at this time. Patient is requesting to be discharge. Patient states she need more ativan to help with with her high anxiety. Patient states that she will just "act-out' without medications. Patient treated for UTI/ BV. Patient to follow-up with PCP.  [name] responded well to treatment with Clonidine,Cymbalta and Neurontin without adverse effects.but this resolved. Pt demonstrated improvement without reported or observed adverse effects to the point of stability appropriate for outpatient management. Pertinent labs include: CBC and Wet Prep. for which outpatient follow-up is necessary for lab recheck as mentioned below. Reviewed CBC, CMP, BAL, and UDS; all unremarkable aside from noted exceptions.   Physical Findings: AIMS: Facial and Oral Movements Muscles of Facial Expression: None, normal Lips and Perioral Area: None, normal Jaw: None, normal Tongue: None, normal,Extremity Movements Upper (arms, wrists, hands, fingers): None, normal Lower (legs, knees, ankles, toes): None, normal, Trunk Movements Neck, shoulders, hips: None, normal, Overall Severity Severity of abnormal movements (highest score from questions above): None, normal Incapacitation due to abnormal movements: None, normal Patient's awareness of abnormal movements (rate only patient's report): No Awareness, Dental Status Current problems with teeth and/or dentures?: No Does patient usually wear dentures?: No  CIWA:  CIWA-Ar  Total: 1 COWS:  COWS Total Score: 1  Musculoskeletal: Strength & Muscle Tone: within normal limits Gait & Station: normal Patient leans: N/A  Psychiatric Specialty Exam: Physical Exam  Nursing note and vitals reviewed. Constitutional: She is oriented to person, place, and time. She appears well-developed.  Musculoskeletal: Normal range of motion.  Neurological: She is alert and oriented to person, place, and time.  Psychiatric: She has a normal mood  and affect. Her behavior is normal.    Review of Systems  Psychiatric/Behavioral: Negative for suicidal ideas and hallucinations. Depression: stable. Nervous/anxious: stable.   All other systems reviewed and are negative.   Blood pressure 107/59, pulse 65, temperature 98.5 F (36.9 C), temperature source Oral, resp. rate 16, height 5\' 3"  (1.6 m), weight 59.421 kg (131 lb), last menstrual period 10/31/2015, SpO2 100 %, not currently breastfeeding.Body mass index is 23.21 kg/(m^2).   Have you used any form of tobacco in the last 30 days? (Cigarettes, Smokeless Tobacco, Cigars, and/or Pipes): Yes  Has this patient used any form of tobacco in the last 30 days? (Cigarettes, Smokeless Tobacco, Cigars, and/or Pipes) Yes, A prescription for an FDA-approved tobacco cessation medication was offered at discharge and the patient refused  Blood Alcohol level:  Lab Results  Component Value Date   [facility]   ETH <5 03/26/2015    Metabolic Disorder Labs:  Lab Results  Component Value Date   HGBA1C 5.8* 05/30/2013   MPG 120* 05/30/2013   No results found for: PROLACTIN No results found for: CHOL, TRIG, HDL, CHOLHDL, VLDL, LDLCALC  See Psychiatric Specialty Exam and Suicide Risk Assessment completed by Attending Physician prior to discharge.  Discharge destination:  Other:  Step Program  Is patient on multiple antipsychotic therapies at discharge:  No   Has Patient had three or more failed trials of antipsychotic monotherapy by history:  No  Recommended Plan for Multiple Antipsychotic Therapies: NA      Discharge Instructions    Activity as tolerated - No restrictions    Complete by:  As directed      Diet general    Complete by:  As directed      Discharge instructions    Complete by:  As directed   Take all medications as prescribed. Keep all follow-up appointments as scheduled.  Do not consume alcohol or use illegal drugs while on prescription medications. Report any  adverse effects from your medications to your primary care provider promptly.  In the event of recurrent symptoms or worsening symptoms, call 911, a crisis hotline, or go to the nearest emergency department for evaluation.            Medication List    STOP taking these medications        acetaminophen 500 MG tablet  Commonly known as:  TYLENOL     amoxicillin-clavulanate 875-125 MG tablet  Commonly known as:  AUGMENTIN     azithromycin 500 MG tablet  Commonly known as:  ZITHROMAX     benztropine 1 MG tablet  Commonly known as:  COGENTIN     HYDROcodone-acetaminophen 5-325 MG tablet  Commonly known as:  NORCO/VICODIN     hydrOXYzine 25 MG tablet  Commonly known as:  ATARAX/VISTARIL     ibuprofen 800 MG tablet  Commonly known as:  ADVIL,MOTRIN     lamoTRIgine 25 MG tablet  Commonly known as:  LAMICTAL     lidocaine 2 % solution  Commonly known as:  XYLOCAINE  mirtazapine 7.5 MG tablet  Commonly known as:  REMERON     nicotine 21 mg/24hr patch  Commonly known as:  NICODERM CQ - dosed in mg/24 hours  Replaced by:  nicotine 14 mg/24hr patch     ondansetron 4 MG disintegrating tablet  Commonly known as:  ZOFRAN ODT      TAKE these medications      Indication   DULoxetine 30 MG capsule  Commonly known as:  CYMBALTA  Take 1 capsule (30 mg total) by mouth daily.   Indication:  Major Depressive Disorder     gabapentin 100 MG capsule  Commonly known as:  NEURONTIN  Take 2 capsules (200 mg total) by mouth 3 (three) times daily.   Indication:  Agitation     metroNIDAZOLE 500 MG tablet  Commonly known as:  FLAGYL  Take 1 tablet (500 mg total) by mouth 2 (two) times daily.   Indication:  Vaginosis caused by Bacteria     nicotine 14 mg/24hr patch  Commonly known as:  NICODERM CQ - dosed in mg/24 hours  Place 1 patch (14 mg total) onto the skin daily.   Indication:  Nicotine Addiction     sulfamethoxazole-trimethoprim 800-160 MG tablet  Commonly known as:   BACTRIM DS,SEPTRA DS  Take 1 tablet by mouth every 12 (twelve) hours.   Indication:  Urinary Tract Infection     traZODone 100 MG tablet  Commonly known as:  DESYREL  Take 1 tablet (100 mg total) by mouth at bedtime as needed and may repeat dose one time if needed for sleep.   Indication:  Trouble Sleeping       Follow-up Information    Follow up with [facility]   Why:  Glee Arvin will call you at discharge to schedule assessment to resume services.    Contact information:   [address] [place], [place] [address] Phone: [phone] Fax: [phone]      Follow up with [facility].      Follow-up recommendations:  Activity:  as tolerated Diet:  as prdx by Primary Care provider   Comments:  Take all medications as prescribed. Keep all follow-up appointments as scheduled.  Do not consume alcohol or use illegal drugs while on prescription medications. Report any adverse effects from your medications to your primary care provider promptly.  In the event of recurrent symptoms or worsening symptoms, call 911, a crisis hotline, or go to the nearest emergency department for evaluation.   Signed: Oneta Rack, NP 11/08/2015, 9:52 AM  Patient seen, Suicide Assessment Completed.  Disposition Plan Reviewed

## 2015-11-08 NOTE — [facility] Suicide Risk Assessment (Addendum)
[facility]   Principal Problem: Cocaine-induced mood disorder [facility](HCC) Discharge Diagnoses:  Patient Active Problem List   Diagnosis Date Noted  . MDD (major depressive disorder), recurrent, severe, with psychosis (HCC) [F33.3] 03/28/2015  . 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 Normal labor [O80, Z37.9] 12/10/2013  . Opiate dependence, continuous (HCC) [F11.20] 08/07/2013  . Cocaine abuse [F14.10] 08/07/2013  . Nausea/vomiting in pregnancy [O21.9] 07/08/2013  . Pica [F50.89] 07/08/2013  . Polysubstance dependence including opioid type drug, continuous use (HCC) [F11.20, F19.20] 06/17/2013  . PTSD (post-traumatic stress disorder) [F43.10] 06/17/2013  . Unspecified vitamin D deficiency [E55.9] 06/04/2013  . Sickle cell trait (HCC) [D57.3] 06/04/2013  . Trichomonal vaginitis in pregnancy in second trimester [O23.592, A59.01] 06/02/2013  . Polysubstance abuse [F19.10] 05/31/2013  . Tobacco use complicating pregnancy [O99.330] 05/30/2013  . Unspecified high-risk pregnancy [O09.90] 05/30/2013  . Marijuana use [F12.10] 05/27/2013  . Opioid dependence (HCC) [F11.20] 09/09/2011  . Pelvic pain in female [R10.2] 12/07/2010  . DUB (dysfunctional uterine bleeding) [N93.8] 12/07/2010  . History of PID [Z87.42] 12/07/2010    Total Time spent with patient: 30 minutes  Musculoskeletal: Strength & Muscle Tone: within normal limits Gait & Station: normal Patient leans: N/A  Psychiatric Specialty Exam: ROS denies fever, denies chills, denies chest pain, no shortness of breath, states she continues to have some abdominal, pelvic cramping , discomfort  No rash   Blood pressure 107/59, pulse 65, temperature 98.5 F (36.9 C), temperature source Oral, resp. rate 16, height 5\' 3"  (1.6 m), weight 131 lb (59.421 kg),  last menstrual period 10/31/2015, SpO2 100 %, not currently breastfeeding.Body mass index is 23.21 kg/(m^2).  General Appearance: Fairly Groomed  Patent attorneyye Contact::  Good  Speech:  Normal Rate409  Volume:  Decreased  Mood:  states she is "OK", at this time denies depression  Affect:  mildly irritable, but reactive - smiles at times appropriately   Thought Process:  Linear  Orientation:  Fully alert and attentive   Thought Content:  denies hallucinations, no delusions, at this time does not appear internally preoccupied   Suicidal Thoughts:  No- she denies any suicidal ideations or self injurious ideations,  denies any homicidal ideations or violent thoughts   Homicidal Thoughts:  No  Memory:  recent and remote grossly intact   Judgement:  Fair  Insight:  Fair  Psychomotor Activity:  Normal- no tremors, no psychomotor restlessness or agitation   Concentration:  Good  Recall:  Good  Fund of Knowledge:Good  Language: Good  Akathisia:  Negative  Handed:  Right  AIMS (if indicated):     Assets:  Resilience  Sleep:  Number of Hours: 2.5  Cognition: WNL  ADL's: improved    Mental Status Per Nursing Assessment::   On Admission:  NA  Demographic Factors:  30 year old single  female , two children, lives with boyfriend   Loss Factors: Unemployment, limited support system, difficulty maintaining sobriety/abstinence   Historical Factors: Depression, PTSD , History of Cocaine and BZD Abuse   Risk Reduction Factors:   Sense of responsibility to family and Positive coping skills or problem solving skills  Continued Clinical Symptoms:  At this time patient is alert, attentive, fairly groomed, remains vaguely irritable, but behavior calm, describes mood as "OK", denies depression at this time, no thought disorder, denies any suicidal or homicidal /violent ideations, no hallucinations, no  delusions. Currently not presenting with acute withdrawal symptoms and does not appear to be in any acute  distress- vitals stable , no fever . Denies medication side effects- we have reviewed medication side effects,to include risk of serious side effects such as severe rash or neuropathy on Metronidazole   Cognitive Features That Contribute To Risk:  No gross cognitive deficits noted upon discharge. Is alert , attentive, and oriented x 3   Suicide Risk:  Mild:  Suicidal ideation of limited frequency, intensity, duration, and specificity.  There are no identifiable plans, no associated intent, mild dysphoria and related symptoms, good self-control (both objective and subjective assessment), few other risk factors, and identifiable protective factors, including available and accessible social support.  Follow-up Information    Follow up with [facility]   Why:  Glee Arvin will call you at discharge to schedule assessment to resume services.    Contact information:   [address] [place], [place] [address] Phone: [phone] Fax: [phone]      Follow up with [facility].      Plan Of Care/Follow-up recommendations:  Activity:  as tolerated  Diet:  Regular  Tests:  NA Other:  See below  Patient is requesting discharge from unit and there are no grounds for involuntary commitment at this time. States she plans to return home. States she plans to maintain sobriety, and plans to return to ED if needed  Follow up as above  Nehemiah Massed, MD 11/08/2015, 10:29 AM

## 2015-11-08 NOTE — Progress Notes (Signed)
D: Patient's self inventory sheet: patient has poor sleep, recieved sleep medication.Fair  Appetite, normal energy level, good concentration. Rated depression 0/10, hopeless 0/10, anxiety 0/10. SI/HI/AVH: Denies. Physical complaints are withdrawal symptoms including diarrhea, cravings, agitation, cramping, nausea and irritability but patient is not interested in recovery from substance use at this time. Goal is "go home". Plans to work on "whatever instructions". Patient has discharge orders and discharge plans, contacted family who will be here to pick her up early this afternoon.   A: Medications administered, assessed medication knowledge and education given on medication regimen.  Emotional support and encouragement given patient. R: Denies SI and HI , contracts for safety. Safety maintained with 15 minute checks.

## 2015-11-08 NOTE — Progress Notes (Signed)
  [facility] :  Will you be returning to the same living situation after discharge:  Yes,  same as prior to admit At discharge, do you have transportation home?: Yes,  with friend Do you have the ability to pay for your medications: Yes,  with Medicaid as per patient  Release of information consent forms completed and in the chart;  Patient's signature needed at discharge.  Patient to Follow up at: Follow-up Information    Follow up with [facility]   Why:  [name] will call you at discharge to schedule assessment to resume services.    Contact information:   [address] [place], [place] [address] Phone: [phone] Fax: [phone]      [facility]      Follow up with [facility]. Schedule an appointment as soon as possible for a visit in 1 week.   Why:  F/u labs    Contact information:   [address] [place] [phone]      Next level of care provider has access to [facility] Link:yes for [place] No fopr [facility]  Safety Planning and Suicide Prevention discussed: Safety discussed with all patients yet this patient did not need specific SPE as there was no SI at admit  Have you used any form of tobacco in the last 30 days? (Cigarettes, Smokeless Tobacco, Cigars, and/or Pipes): Yes  Has patient been referred to the Quitline?: Patient refused referral and understands dangers of tobacco products  Patient has been referred for addiction treatment: Yes, to [facility] she has received services before  Clide DalesHarrill, Catherine Campbell 11/08/2015, 12:45 PM

## 2015-11-08 NOTE — Progress Notes (Signed)
Patient to discharged ambulatory to lobby. All discharge paperwork given and signed, belongings returned. Prescriptions given. Patient able to verbalize understanding. Patient stable, denies SI/HI/AVH. Patient given opportunity to express concerns and ask questions.

## 2015-11-13 ENCOUNTER — Encounter (HOSPITAL_COMMUNITY): Payer: Self-pay | Admitting: Emergency Medicine

## 2015-11-13 ENCOUNTER — Emergency Department (HOSPITAL_COMMUNITY)
Admission: EM | Admit: 2015-11-13 | Discharge: 2015-11-16 | Disposition: A | Discharge status: Home / Self Care | Payer: Medicaid Other | Attending: Emergency Medicine | Admitting: Emergency Medicine

## 2015-11-13 DIAGNOSIS — F1414 Cocaine abuse with cocaine-induced mood disorder: Secondary | ICD-10-CM | POA: Insufficient documentation

## 2015-11-13 DIAGNOSIS — F1410 Cocaine abuse, uncomplicated: Secondary | ICD-10-CM | POA: Diagnosis present

## 2015-11-13 DIAGNOSIS — Z79899 Other long term (current) drug therapy: Secondary | ICD-10-CM | POA: Insufficient documentation

## 2015-11-13 DIAGNOSIS — F17200 Nicotine dependence, unspecified, uncomplicated: Secondary | ICD-10-CM | POA: Insufficient documentation

## 2015-11-13 DIAGNOSIS — F1920 Other psychoactive substance dependence, uncomplicated: Secondary | ICD-10-CM

## 2015-11-13 DIAGNOSIS — F600 Paranoid personality disorder: Secondary | ICD-10-CM | POA: Diagnosis present

## 2015-11-13 DIAGNOSIS — F333 Major depressive disorder, recurrent, severe with psychotic symptoms: Secondary | ICD-10-CM | POA: Diagnosis not present

## 2015-11-13 DIAGNOSIS — F1120 Opioid dependence, uncomplicated: Secondary | ICD-10-CM | POA: Diagnosis present

## 2015-11-13 LAB — COMPREHENSIVE METABOLIC PANEL
ALBUMIN: 5.1 g/dL — AB (ref 3.5–5.0)
ALK PHOS: 59 U/L (ref 38–126)
ALT: 26 U/L (ref 14–54)
ANION GAP: 11 (ref 5–15)
AST: 33 U/L (ref 15–41)
BILIRUBIN TOTAL: 1.3 mg/dL — AB (ref 0.3–1.2)
BUN: 18 mg/dL (ref 6–20)
CO2: 23 mmol/L (ref 22–32)
Calcium: 9.5 mg/dL (ref 8.9–10.3)
Chloride: 102 mmol/L (ref 101–111)
Creatinine, Ser: 0.94 mg/dL (ref 0.44–1.00)
GFR calc Af Amer: >60 mL/min (ref >60)
GFR calc non Af Amer: >60 mL/min (ref >60)
GLUCOSE: 107 mg/dL — AB (ref 65–99)
Potassium: 4.3 mmol/L (ref 3.5–5.1)
Sodium: 136 mmol/L (ref 135–145)
TOTAL PROTEIN: 8.7 g/dL — AB (ref 6.5–8.1)

## 2015-11-13 LAB — CBC WITH DIFFERENTIAL/PLATELET
BASOS ABS: 0 10*3/uL (ref 0.0–0.1)
BASOS PCT: 0 %
EOS ABS: 0.1 10*3/uL (ref 0.0–0.7)
EOS PCT: 0 %
HEMATOCRIT: 39 % (ref 36.0–46.0)
Hemoglobin: 13.1 g/dL (ref 12.0–15.0)
Lymphocytes Relative: 17 %
Lymphs Abs: 2.2 10*3/uL (ref 0.7–4.0)
MCH: 30 pg (ref 26.0–34.0)
MCHC: 33.6 g/dL (ref 30.0–36.0)
MCV: 89.2 fL (ref 78.0–100.0)
MONO ABS: 1 10*3/uL (ref 0.1–1.0)
Monocytes Relative: 8 %
Neutro Abs: 9.8 10*3/uL — ABNORMAL HIGH (ref 1.7–7.7)
Neutrophils Relative %: 75 %
Platelets: 461 10*3/uL — ABNORMAL HIGH (ref 150–400)
RBC: 4.37 MIL/uL (ref 3.87–5.11)
RDW: 14 % (ref 11.5–15.5)
WBC: 13 10*3/uL — ABNORMAL HIGH (ref 4.0–10.5)

## 2015-11-13 LAB — RAPID URINE DRUG SCREEN, HOSP PERFORMED
Amphetamines: NOT DETECTED
BARBITURATES: NOT DETECTED
BENZODIAZEPINES: POSITIVE — AB
Cocaine: POSITIVE — AB
Opiates: POSITIVE — AB
Tetrahydrocannabinol: POSITIVE — AB

## 2015-11-13 LAB — ETHANOL

## 2015-11-13 LAB — POC URINE PREG, ED: PREG TEST UR: NEGATIVE

## 2015-11-13 LAB — SALICYLATE LEVEL

## 2015-11-13 LAB — ACETAMINOPHEN LEVEL

## 2015-11-13 MED ORDER — ACETAMINOPHEN 325 MG PO TABS
650.0000 mg | ORAL_TABLET | Freq: Once | ORAL | Status: AC
  Administered 2015-11-13: 650 mg via ORAL
  Filled 2015-11-13: qty 2

## 2015-11-13 NOTE — BH Assessment (Signed)
[facility] Assessment Progress Note  Case was staffed with Link SnufferHobson FNP who recommended patient be observed for her own safety due to the level of impairment and be re-evaluated in the a.m.

## 2015-11-13 NOTE — ED Provider Notes (Signed)
CSN: [id number]     Arrival date & time 11/13/15  1636 History   First MD Initiated Contact with Patient 11/13/15 1706     Chief Complaint  Patient presents with  . Paranoid     (Consider location/radiation/quality/duration/timing/severity/associated sxs/prior Treatment) HPI   30 year old female with a history of endometriosis, substance abuse, major depression, recent admission and evaluation with behavioral health for concern of substance abuse presents with concern for relapse after discharge on Monday. Patient reports that she went straight from the hospital to the crack house, and began using heroin and crack cocaine over the last 3 days.  Pt feeling paranoid, "like everyone is out to get me", reports feeling "paranoid". IS rocking back and forth, crying, unable to participate in full history.  Reports "i need help" and I "went right back to using."  Did not follow up with psych.  Biggest concern is paranoia in setting of using drugs and relapse. Denies SI/HI/hallucinations.  Has not taken flagyl since discharge for BV.   Past Medical History  Diagnosis Date  . STD (female)     hx of chlamydia and gonorrhea  . History of PID   . Endometriosis   . Glaucoma   . Ovarian cyst   . Bartholin cyst   . Headache(784.0)   . Infection     UTI  . Vaginal Pap smear, abnormal     has not followed up  . Substance abuse    Past Surgical History  Procedure Laterality Date  . Fracture surgery      left leg  . Dilation and curettage of uterus    . Laparoscopy     Family History  Problem Relation Age of Onset  . Anesthesia problems Neg Hx   . Hypotension Neg Hx   . Malignant hyperthermia Neg Hx   . Pseudochol deficiency Neg Hx   . Alcohol abuse Neg Hx   . Diabetes Mother   . Hypertension Maternal Grandmother   . Heart disease Maternal Grandmother     great grandma   Social History  Substance Use Topics  . Smoking status: Current Every Day Smoker -- 0.25 packs/day for 7 years  .  Smokeless tobacco: Never Used  . Alcohol Use: No     Comment: Denies ETOH use   OB History    Gravida Para Term Preterm AB TAB SAB Ectopic Multiple Living   4 2 1 1 2  0 2 0 0 2     Review of Systems  Unable to perform ROS: Mental status change  Constitutional: Negative for fever.  Respiratory: Negative for cough.   Cardiovascular: Negative for chest pain.  Gastrointestinal: Positive for abdominal pain (unchaged). Negative for nausea and vomiting.  Genitourinary: Positive for vaginal discharge.  Skin: Negative for rash.  Neurological: Negative for headaches.  Psychiatric/Behavioral: Positive for sleep disturbance. Negative for suicidal ideas. The patient is nervous/anxious and is hyperactive.       Allergies  Acyclovir and related; Darvocet; Doxycycline; Flexeril; Metoclopramide; Naproxen; Latex; and Tramadol  Home Medications   Prior to Admission medications   Medication Sig Start Date End Date Taking? Authorizing Provider  DULoxetine (CYMBALTA) 30 MG capsule Take 1 capsule (30 mg total) by mouth daily. 11/08/15  Yes Oneta Rackanika N Lewis, NP  gabapentin (NEURONTIN) 100 MG capsule Take 2 capsules (200 mg total) by mouth 3 (three) times daily. 11/08/15  Yes Oneta Rackanika N Lewis, NP  nicotine (NICODERM CQ - DOSED IN MG/24 HOURS) 14 mg/24hr patch Place 1 patch (14  mg total) onto the skin daily. 11/08/15  Yes Oneta Rack, NP  traZODone (DESYREL) 100 MG tablet Take 1 tablet (100 mg total) by mouth at bedtime as needed and may repeat dose one time if needed for sleep. 11/08/15  Yes Oneta Rack, NP  metroNIDAZOLE (FLAGYL) 500 MG tablet Take 1 tablet (500 mg total) by mouth 2 (two) times daily. Patient not taking: Reported on 11/13/2015 11/08/15   Oneta Rack, NP  sulfamethoxazole-trimethoprim (BACTRIM DS,SEPTRA DS) 800-160 MG tablet Take 1 tablet by mouth every 12 (twelve) hours. Patient not taking: Reported on 11/13/2015 11/08/15   Oneta Rack, NP   BP 135/99 mmHg  Pulse 99  Temp(Src) 98.8  F (37.1 C) (Oral)  Resp 16  SpO2 95%  LMP 10/31/2015 Physical Exam  Constitutional: She is oriented to person, place, and time. She appears well-developed and well-nourished. No distress.  Agitated, rocking back and forth, crying, inconsolable  HENT:  Head: Normocephalic and atraumatic.  Eyes: Conjunctivae and EOM are normal.  Neck: Normal range of motion.  Cardiovascular: Normal rate, regular rhythm, normal heart sounds and intact distal pulses.  Exam reveals no gallop and no friction rub.   No murmur heard. Pulmonary/Chest: Effort normal and breath sounds normal. No respiratory distress. She has no wheezes. She has no rales.  Abdominal: Soft. She exhibits no distension. There is no tenderness. There is no guarding.  Musculoskeletal: She exhibits no edema or tenderness.  Neurological: She is alert and oriented to person, place, and time.  Skin: Skin is warm and dry. No rash noted. She is not diaphoretic. No erythema.  Psychiatric: Her mood appears anxious. Her speech is tangential. She is hyperactive. Thought content is paranoid. She exhibits a depressed mood. She expresses no homicidal and no suicidal ideation. She expresses no suicidal plans and no homicidal plans.  Nursing note and vitals reviewed.   ED Course  Procedures (including critical care time) Labs Review Labs Reviewed  CBC WITH DIFFERENTIAL/PLATELET - Abnormal; Notable for the following:    WBC 13.0 (*)    Platelets 461 (*)    Neutro Abs 9.8 (*)    All other components within normal limits  COMPREHENSIVE METABOLIC PANEL - Abnormal; Notable for the following:    Glucose, Bld 107 (*)    Total Protein 8.7 (*)    Albumin 5.1 (*)    Total Bilirubin 1.3 (*)    All other components within normal limits  URINE RAPID DRUG SCREEN, HOSP PERFORMED - Abnormal; Notable for the following:    Opiates POSITIVE (*)    Cocaine POSITIVE (*)    Benzodiazepines POSITIVE (*)    Tetrahydrocannabinol POSITIVE (*)    All other  components within normal limits  ACETAMINOPHEN LEVEL - Abnormal; Notable for the following:    Acetaminophen (Tylenol), Serum <10 (*)    All other components within normal limits  SALICYLATE LEVEL  ETHANOL  POC URINE PREG, ED    Imaging Review No results found. I have personally reviewed and evaluated these images and lab results as part of my medical decision-making.   EKG Interpretation None      MDM   Final diagnoses:  None   30 year old female with a history of endometriosis, substance abuse, major depression, recent admission and evaluation with behavioral health for concern of substance abuse presents with concern for relapse after discharge on Monday. Patient reports that she went straight from the hospital 2 the crack house, and began using heroin and crack cocaine over  the last 3 days.  Patient hyperactive with paranoid thoughts, likely secondary to polysubstance abuse.  Labs obtained showing no acute findings other than signs of polysubstance abuse, mild leukocytosis likely in setting of drug use.   I have low suspicion for other acute medical pathology and feel primary reason for visit today is paranoid thoughts caused by polysubstance abuse. Patient with pelvic work up prior to last psychiatric admission, with neg GC/CHl/HIV, however signs of BV for which she received 1 week of treatment prior to discharge on Monday which I feel is adequate.   TTS evaluated patient and do not feel she will benefit from a second inpatient stay given her immediate relapse, however recommend observing patient until she becomes clinically sober and feel she is unsafe to leave at this time.   Patient voluntary at this time, and feel IVC not indicated at this time, however would assess mental state at time if patient does desire discharge-- if mental state is improved feel discharge would be appropriate per TTS evaluation given failure of recent inpt therapy. Pt medically cleared and awaiting  reevaluation by psychiatry tomorrow.   Alvira Monday, MD 11/14/15 [phone]

## 2015-11-13 NOTE — BH Assessment (Addendum)
Assessment Note  Mancel ParsonsSarah D Jian is an 30 y.o. female that presents this date extremely impaired on multiple substances. Patient is paranoid and did not answer most of the questions on the assessment stating "you are one of them." Patient admitted to using 1 gram of Heroin this date along with 1 gram of cocaine and "a ton of other sh*t" per patient. Patient cannot sit still and paces in the triage room looking out the window. Patient is difficult to redirect and is a poor historian at the time of assessment. Patient is unsure [facility]. Patient denies any SI, HI or AVH. Patient has multiple admissions and [facility]. Patient reports extreme depression with symptoms to include hopelessness and feelings of worthlessness. Patient stated she did not follow up with any outpatient provider [facility] and relapsed that day. Patient is requesting a voluntary admission to assist with SA issues.She has a [facility] and [facility]. Case was staffed with Link SnufferHobson FNP who recommended patient be observed for her own safety due to the level of impairment and be re-evaluated in the a.m.   Diagnosis: Major Depressive Disorder, Recurrent, Severe, with Psychotic Features and Polysubstance Abuse  Past Medical History:  Past Medical History  Diagnosis Date  . STD (female)     hx of chlamydia and gonorrhea  . History of PID   . Endometriosis   . Glaucoma   . Ovarian cyst   . Bartholin cyst   . Headache(784.0)   . Infection     UTI  . Vaginal Pap smear, abnormal     has not followed up  . Substance abuse     Past Surgical History  Procedure Laterality Date  . Fracture surgery      left leg  . Dilation and curettage of uterus    . Laparoscopy      Family History:  Family History  Problem Relation Age of Onset  . Anesthesia problems Neg Hx   . Hypotension Neg Hx   . Malignant  hyperthermia Neg Hx   . Pseudochol deficiency Neg Hx   . Alcohol abuse Neg Hx   . Diabetes Mother   . Hypertension Maternal Grandmother   . Heart disease Maternal Grandmother     great grandma    Social History:  reports that she has been smoking.  She has never used smokeless tobacco. She reports that she uses illicit drugs (Marijuana, Cocaine, and Heroin). She reports that she does not drink alcohol.  Additional Social History:  Alcohol / Drug Use Pain Medications: See MAR Prescriptions: See MAR Over the Counter: See MAR History of alcohol / drug use?: Yes Substance #1 Name of Substance 1: Cocaine  1 - Age of First Use: 30 yrs old  1 - Amount (size/oz): 1 gram per use 1 - Frequency: daily use 1 - Duration: 1 month 1 - Last Use / Amount: 11/13/15 1/2 gram Substance #2 Name of Substance 2: THC 2 - Age of First Use: 14 2 - Amount (size/oz): 1/2 gram 2 - Frequency: daily 2 - Duration: on-going 2 - Last Use / Amount: 11/13/15 1/2 gram Substance #3 Name of Substance 3: heroin 3 - Age of First Use: 24 3 - Amount (size/oz): 1/2 gram  3 - Frequency: daily 3 - Duration: few months 3 - Last Use / Amount: 1/2 gram heroin  CIWA: CIWA-Ar BP: Marland Kitchen(!)  145/101 mmHg Pulse Rate: 109 COWS:    Allergies:  Allergies  Allergen Reactions  . Acyclovir And Related Swelling  . Darvocet [Propoxyphene N-Acetaminophen] Hives  . Doxycycline Swelling  . Flexeril [Cyclobenzaprine] Nausea And Vomiting  . Metoclopramide Hives  . Naproxen Hives    Pt is able to take ibuprofen without problems  . Latex Rash  . Tramadol Rash    Home Medications:  (Not in a hospital admission)  OB/GYN Status:  Patient's last menstrual period was 10/31/2015.  General Assessment Data Location of Assessment: [facility] ED TTS Assessment: In system Is this a Tele or Face-to-Face Assessment?: Face-to-Face Is this an Initial Assessment or a Re-assessment for this encounter?: Initial Assessment Marital status:  Single Maiden name: na Is patient pregnant?: No Pregnancy Status: No Living Arrangements: Spouse/significant other, Children Can pt return to current living arrangement?: Yes Admission Status: Voluntary Is patient capable of signing voluntary admission?: Yes Referral Source: Self/Family/Friend Insurance type: Medicaid  Medical Screening Exam [facility] Walk-in ONLY) Medical Exam completed: Yes  Crisis Care Plan Living Arrangements: Spouse/significant other, Children Legal Guardian: Other: (na) Name of Psychiatrist: None Name of Therapist: None  Education Status Is patient currently in school?: No Current Grade: na Highest grade of school patient has completed: 10 Name of school: na Contact person: na  Risk to self with the past 6 months Suicidal Ideation: No Has patient been a risk to self within the past 6 months prior to admission? : No Suicidal Intent: No Has patient had any suicidal intent within the past 6 months prior to admission? : No Is patient at risk for suicide?: No Suicidal Plan?: No Has patient had any suicidal plan within the past 6 months prior to admission? : No Access to Means: No What has been your use of drugs/alcohol within the last 12 months?: Current use Previous Attempts/Gestures: Yes How many times?:  (pt doen't remember) Other Self Harm Risks:  (None) Triggers for Past Attempts: Other (Comment) (Family issues) Intentional Self Injurious Behavior: None (pt stated in the past) Comment - Self Injurious Behavior: cutting (in the past) Family Suicide History: No Recent stressful life event(s): Other (Comment) (family issues) Persecutory voices/beliefs?: No Depression: Yes Depression Symptoms: Feeling worthless/self pity, Feeling angry/irritable Substance abuse history and/or treatment for substance abuse?: Yes Suicide prevention information given to non-admitted patients: Not applicable  Risk to Others within the past 6 months Homicidal Ideation:  No Does patient have any lifetime risk of violence toward others beyond the six months prior to admission? : No Thoughts of Harm to Others: No Current Homicidal Intent: No Current Homicidal Plan: No Access to Homicidal Means: No Identified Victim:  (NA) History of harm to others?: No Assessment of Violence: None Noted Violent Behavior Description:  (NA) Does patient have access to weapons?: No Criminal Charges Pending?: No Does patient have a court date: No Is patient on probation?: No  Psychosis Hallucinations: None noted Delusions: None noted  Mental Status Report Appearance/Hygiene: Unremarkable, Revealing clothes/seductive clothing Eye Contact: Fair Motor Activity: Agitation Speech: Rapid, Pressured Level of Consciousness: Sedated Mood: Anxious Affect: Anxious Anxiety Level: Moderate Thought Processes: Irrelevant Judgement: Impaired Orientation: Person, Place, Time, Situation Obsessive Compulsive Thoughts/Behaviors: None  Cognitive Functioning Concentration: Decreased Memory: Recent Intact IQ: Average Insight: Poor Impulse Control: Poor Appetite: Poor Weight Loss: 0 Weight Gain: 0 Sleep: Decreased Total Hours of Sleep: 2 Vegetative Symptoms: None  ADLScreening [facility] Assessment Services) Patient's cognitive ability adequate to safely complete daily activities?: Yes Patient able to express need for assistance  with ADLs?: Yes Independently performs ADLs?: Yes (appropriate for developmental age)  Prior Inpatient Therapy Prior Inpatient Therapy: Yes Prior Therapy Dates: 2017 Prior Therapy Facilty/Provider(s): [facility] Reason for Treatment: SA use   Prior Outpatient Therapy Prior Outpatient Therapy: Yes Prior Therapy Dates: 2017 Prior Therapy Facilty/Provider(s): [facility] Reason for Treatment: SA use Does patient have an ACCT team?: No Does patient have Intensive In-House Services?  : No Does patient have [facility] services? : No Does patient have P4CC services?:  No  ADL Screening (condition at time of admission) Patient's cognitive ability adequate to safely complete daily activities?: Yes Is the patient deaf or have difficulty hearing?: No Does the patient have difficulty seeing, even when wearing glasses/contacts?: No Does the patient have difficulty concentrating, remembering, or making decisions?: Yes Patient able to express need for assistance with ADLs?: Yes Does the patient have difficulty dressing or bathing?: No Independently performs ADLs?: Yes (appropriate for developmental age) Does the patient have difficulty walking or climbing stairs?: No Weakness of Legs: None Weakness of Arms/Hands: None  Home Assistive Devices/Equipment Home Assistive Devices/Equipment: None  Therapy Consults (therapy consults require a physician order) PT Evaluation Needed: No OT Evalulation Needed: No SLP Evaluation Needed: No Abuse/Neglect Assessment (Assessment to be complete while patient is alone) Physical Abuse: Denies Verbal Abuse: Denies Sexual Abuse: Denies Exploitation of patient/patient's resources: Denies Self-Neglect: Denies Values / Beliefs Cultural Requests During Hospitalization: None Spiritual Requests During Hospitalization: None Consults Spiritual Care Consult Needed: No Social Work Consult Needed: No Merchant navy officer (For Healthcare) Does patient have an advance directive?: No Would patient like information on creating an advanced directive?: No - patient declined information (pt declined)    Additional Information 1:1 In Past 12 Months?: No CIRT Risk: No Elopement Risk: No Does patient have medical clearance?: Yes     Disposition:  Disposition Initial Assessment Completed for this Encounter: Yes Disposition of Patient: Other dispositions Type of inpatient treatment program: Adult Other disposition(s): Other (Comment) (Observe and re-evaluate in the a.m.)  On Site Evaluation by:   Reviewed with Physician:     Alfredia Ferguson 11/13/2015 5:52 PM

## 2015-11-13 NOTE — ED Notes (Signed)
Per pt, states she is extremely paranoid-has been using cocaine and heroin-states she thinks people are out to harm her-tearful

## 2015-11-13 NOTE — ED Notes (Signed)
Pt has in belonging bag:  Pink bra, black wig, grey undershirt.

## 2015-11-14 DIAGNOSIS — F333 Major depressive disorder, recurrent, severe with psychotic symptoms: Secondary | ICD-10-CM

## 2015-11-14 DIAGNOSIS — F1120 Opioid dependence, uncomplicated: Secondary | ICD-10-CM

## 2015-11-14 DIAGNOSIS — F1920 Other psychoactive substance dependence, uncomplicated: Secondary | ICD-10-CM

## 2015-11-14 LAB — CK: Total CK: 375 U/L — ABNORMAL HIGH (ref 38–234)

## 2015-11-14 MED ORDER — ONDANSETRON 4 MG PO TBDP: 4.0000 mg | ORAL_TABLET | Freq: Four times a day (QID) | ORAL | Status: DC | PRN

## 2015-11-14 MED ORDER — GABAPENTIN 100 MG PO CAPS
200.0000 mg | ORAL_CAPSULE | Freq: Three times a day (TID) | ORAL | Status: DC
  Administered 2015-11-14 – 2015-11-16 (×6): 200 mg via ORAL
  Filled 2015-11-14 (×6): qty 2

## 2015-11-14 MED ORDER — DICYCLOMINE HCL 20 MG PO TABS
20.0000 mg | ORAL_TABLET | Freq: Four times a day (QID) | ORAL | Status: DC | PRN
  Administered 2015-11-14: 20 mg via ORAL
  Filled 2015-11-14: qty 1

## 2015-11-14 MED ORDER — LOPERAMIDE HCL 2 MG PO CAPS: 2.0000 mg | ORAL_CAPSULE | ORAL | Status: DC | PRN

## 2015-11-14 MED ORDER — CLONIDINE HCL 0.1 MG PO TABS
0.1000 mg | ORAL_TABLET | Freq: Four times a day (QID) | ORAL | Status: AC
  Administered 2015-11-14 – 2015-11-15 (×7): 0.1 mg via ORAL
  Filled 2015-11-14 (×8): qty 1

## 2015-11-14 MED ORDER — METHOCARBAMOL 500 MG PO TABS
500.0000 mg | ORAL_TABLET | Freq: Three times a day (TID) | ORAL | Status: DC | PRN
  Administered 2015-11-14 – 2015-11-16 (×4): 500 mg via ORAL
  Filled 2015-11-14 (×4): qty 1

## 2015-11-14 MED ORDER — DULOXETINE HCL 30 MG PO CPEP
30.0000 mg | ORAL_CAPSULE | Freq: Every day | ORAL | Status: DC
  Administered 2015-11-14 – 2015-11-16 (×3): 30 mg via ORAL
  Filled 2015-11-14 (×4): qty 1

## 2015-11-14 MED ORDER — ACETAMINOPHEN 325 MG PO TABS
650.0000 mg | ORAL_TABLET | Freq: Four times a day (QID) | ORAL | Status: DC | PRN
  Administered 2015-11-14 – 2015-11-15 (×2): 650 mg via ORAL
  Filled 2015-11-14 (×2): qty 2

## 2015-11-14 MED ORDER — CLONIDINE HCL 0.1 MG PO TABS
0.1000 mg | ORAL_TABLET | ORAL | Status: DC
  Administered 2015-11-16: 0.1 mg via ORAL
  Filled 2015-11-14: qty 1

## 2015-11-14 MED ORDER — HYDROXYZINE HCL 25 MG PO TABS
25.0000 mg | ORAL_TABLET | Freq: Four times a day (QID) | ORAL | Status: DC | PRN
  Administered 2015-11-14 – 2015-11-15 (×3): 25 mg via ORAL
  Filled 2015-11-14 (×3): qty 1

## 2015-11-14 MED ORDER — CLONIDINE HCL 0.1 MG PO TABS: 0.1000 mg | ORAL_TABLET | Freq: Every day | ORAL | Status: DC

## 2015-11-14 MED ORDER — TRAZODONE HCL 100 MG PO TABS
100.0000 mg | ORAL_TABLET | Freq: Every evening | ORAL | Status: DC | PRN
  Administered 2015-11-14 – 2015-11-15 (×2): 100 mg via ORAL
  Filled 2015-11-14 (×2): qty 1

## 2015-11-14 NOTE — ED Notes (Signed)
Pt admitted to room #40. Pt irritable on approach, presents with anxiety. Pt c/o depression and paranoia. Pt reports she came to the hospital d/t "I relapsed." Pt denies SI/HI. Endorsing AVH. Pt guarded. 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 Special checks q 15 mins in place for safety. Video monitoring in place.

## 2015-11-14 NOTE — ED Notes (Signed)
Unable to collect labs patient is in the shower

## 2015-11-14 NOTE — ED Notes (Signed)
Patient noted in room. No complaints, stable, in no acute distress. Q15 minute rounds and monitoring via security cameras continue for safety. 

## 2015-11-14 NOTE — Consult Note (Signed)
[place] Consult   Reason for Consult:  Polysubstance abuse, depression, paranoia Referring Physician:  EDP Patient Identification: [name] MRN:  [id number] Principal Diagnosis: Polysubstance dependence including opioid type drug, continuous use [facility]) Diagnosis:   Patient Active Problem List   Diagnosis Date Noted  . MDD (major depressive disorder), recurrent, severe, with psychosis ([place]) [F33.3] 03/28/2015    Priority: High  . Polysubstance dependence including opioid type drug, continuous use ([place]) [F11.20, F19.20] 06/17/2013    Priority: High  . 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 Cocaine-induced mood disorder ([place]) [F14.94] 03/27/2015  . Normal labor [O80, Z37.9] 12/10/2013  . Opiate dependence, continuous ([place]) [F11.20] 08/07/2013  . Cocaine abuse [F14.10] 08/07/2013  . Nausea/vomiting in pregnancy [O21.9] 07/08/2013  . [place] [F50.89] 07/08/2013  . PTSD (post-traumatic stress disorder) [F43.10] 06/17/2013  . Unspecified vitamin D deficiency [E55.9] 06/04/2013  . Sickle cell trait ([place]) [D57.3] 06/04/2013  . Trichomonal vaginitis in pregnancy in second trimester [O23.592, A59.01] 06/02/2013  . Polysubstance abuse [F19.10] 05/31/2013  . Tobacco use complicating pregnancy [B14.782] 05/30/2013  . Unspecified high-risk pregnancy [O09.90] 05/30/2013  . Marijuana use [F12.10] 05/27/2013  . Opioid dependence ([place]) [F11.20] 09/09/2011  . Pelvic pain in female [R10.2] 12/07/2010  . DUB (dysfunctional uterine bleeding) [N93.8] 12/07/2010  . History of PID [Z87.42] 12/07/2010    Total Time spent with patient: 45 minutes  Subjective:   [name] is a 30 y.o. female patient admitted with drug intoxication, paranoia.  HPI: 30 year old female with a history of endometriosis, polysubstance abuse, major depression, psychosis, patient was discharged from  [facility] on 11/03/15. Patient presents with depression, feeling of hopelessness, hearing voices, seeing things, anxiety, excessive worries and paranoia-recurrent feeling of people hurting her and her 2 children. Patient relapsed on drugs use 2nd day after she was discharged from the inpatient. She denies SI/HI and seeking for drug rehab and detoxification.    Past Psychiatric History: as above  Risk to Self: Suicidal Ideation: No Suicidal Intent: No Is patient at risk for suicide?: No Suicidal Plan?: No Access to Means: No What has been your use of drugs/alcohol within the last 12 months?: Current use How many times?:  (pt doen't remember) Other Self Harm Risks:  (None) Triggers for Past Attempts: Other (Comment) (Family issues) Intentional Self Injurious Behavior: None (pt stated in the past) Comment - Self Injurious Behavior: cutting (in the past) Risk to Others: Homicidal Ideation: No Thoughts of Harm to Others: No Current Homicidal Intent: No Current Homicidal Plan: No Access to Homicidal Means: No Identified Victim:  (NA) History of harm to others?: No Assessment of Violence: None Noted Violent Behavior Description:  (NA) Does patient have access to weapons?: No Criminal Charges Pending?: No Does patient have a court date: No Prior Inpatient Therapy: Prior Inpatient Therapy: Yes Prior Therapy Dates: 2017 Prior Therapy Facilty/Provider(s): [facility] Reason for Treatment: SA use  Prior Outpatient Therapy: Prior Outpatient Therapy: Yes Prior Therapy Dates: 2017 Prior Therapy Facilty/Provider(s): [facility] Reason for Treatment: SA use Does patient have an ACCT team?: No Does patient have Intensive In-House Services?  : No Does patient have [facility] services? : No Does patient have P4CC services?: No  Past Medical History:  Past Medical History  Diagnosis Date  . STD (female)     hx of chlamydia and gonorrhea  . History of PID   . Endometriosis   . Glaucoma   . Ovarian cyst   .  Bartholin cyst   . Headache(784.0)   . Infection     UTI  . Vaginal Pap smear, abnormal     has not followed up  . Substance abuse     Past Surgical History  Procedure Laterality Date  . Fracture surgery      left leg  . Dilation and curettage of uterus    . Laparoscopy     Family History:  Family History  Problem Relation Age of Onset  . Anesthesia problems Neg Hx   . Hypotension Neg Hx   . Malignant hyperthermia Neg Hx   . Pseudochol deficiency Neg Hx   . Alcohol abuse Neg Hx   . Diabetes Mother   . Hypertension Maternal Grandmother   . Heart disease Maternal Grandmother     great grandma   Family Psychiatric  History: mother died from drug abuse Social History:  History  Alcohol Use No    Comment: Denies ETOH use     History  Drug Use  . Yes  . Special: Marijuana, Cocaine, Heroin    Comment: heroin    Social History   Social History  . Marital Status: Single    Spouse Name: N/A  . Number of Children: N/A  . Years of Education: N/A   Social History Main Topics  . Smoking status: Current Every Day Smoker -- 0.25 packs/day for 7 years  . Smokeless tobacco: Never Used  . Alcohol Use: No     Comment: Denies ETOH use  . Drug Use: Yes    Special: Marijuana, Cocaine, Heroin     Comment: heroin  . Sexual Activity: Not Asked     Comment: Pt currently [redacted] weeks pregnant   Other Topics Concern  . None   Social History Narrative   Additional Social History:    Allergies:   Allergies  Allergen Reactions  . Acyclovir And Related Swelling  . Darvocet [Propoxyphene N-Acetaminophen] Hives  . Doxycycline Swelling  . Flexeril [Cyclobenzaprine] Nausea And Vomiting  . Metoclopramide Hives  . Naproxen Hives    Pt is able to take ibuprofen without problems  . Latex Rash  . Tramadol Rash    Labs:  Results for orders placed or performed during the hospital encounter of 11/13/15 (from the past 48 hour(s))  CBC with Differential     Status: Abnormal    Collection Time: 11/13/15  6:34 PM  Result Value Ref Range   WBC 13.0 (H) 4.0 - 10.5 K/uL   RBC 4.37 3.87 - 5.11 MIL/uL   Hemoglobin 13.1 12.0 - 15.0 g/dL   HCT 39.0 36.0 - 46.0 %   MCV 89.2 78.0 - 100.0 fL   MCH 30.0 26.0 - 34.0 pg   MCHC 33.6 30.0 - 36.0 g/dL   RDW 14.0 11.5 - 15.5 %   Platelets 461 (H) 150 - 400 K/uL   Neutrophils Relative % 75 %   Neutro Abs 9.8 (H) 1.7 - 7.7 K/uL   Lymphocytes Relative 17 %   Lymphs Abs 2.2 0.7 - 4.0 K/uL   Monocytes Relative 8 %   Monocytes Absolute 1.0 0.1 - 1.0 K/uL   Eosinophils Relative 0 %   Eosinophils Absolute 0.1 0.0 - 0.7 K/uL   Basophils Relative 0 %   Basophils Absolute 0.0 0.0 - 0.1 K/uL  Comprehensive metabolic panel     Status: Abnormal   Collection Time: 11/13/15  6:34 PM  Result Value Ref Range   Sodium 136 135 - 145 mmol/L  Potassium 4.3 3.5 - 5.1 mmol/L   Chloride 102 101 - 111 mmol/L   CO2 23 22 - 32 mmol/L   Glucose, Bld 107 (H) 65 - 99 mg/dL   BUN 18 6 - 20 mg/dL   Creatinine, Ser 0.94 0.44 - 1.00 mg/dL   Calcium 9.5 8.9 - 10.3 mg/dL   Total Protein 8.7 (H) 6.5 - 8.1 g/dL   Albumin 5.1 (H) 3.5 - 5.0 g/dL   AST 33 15 - 41 U/L   ALT 26 14 - 54 U/L   Alkaline Phosphatase 59 38 - 126 U/L   Total Bilirubin 1.3 (H) 0.3 - 1.2 mg/dL   GFR calc non Af Amer >60 >60 mL/min   GFR calc Af Amer >60 >60 mL/min    Comment: (NOTE) The eGFR has been calculated using the CKD EPI equation. This calculation has not been validated in all clinical situations. eGFR's persistently <60 mL/min signify possible Chronic Kidney Disease.    Anion gap 11 5 - 15  Salicylate level     Status: None   Collection Time: 11/13/15  6:34 PM  Result Value Ref Range   Salicylate Lvl <7.1 2.8 - 30.0 mg/dL  Acetaminophen level     Status: Abnormal   Collection Time: 11/13/15  6:34 PM  Result Value Ref Range   Acetaminophen (Tylenol), Serum <10 (L) 10 - 30 ug/mL    Comment:        THERAPEUTIC CONCENTRATIONS VARY SIGNIFICANTLY. A RANGE OF  10-30 ug/mL MAY BE AN EFFECTIVE CONCENTRATION FOR MANY PATIENTS. HOWEVER, SOME ARE BEST TREATED AT CONCENTRATIONS OUTSIDE THIS RANGE. ACETAMINOPHEN CONCENTRATIONS >150 ug/mL AT 4 HOURS AFTER INGESTION AND >50 ug/mL AT 12 HOURS AFTER INGESTION ARE OFTEN ASSOCIATED WITH TOXIC REACTIONS.   Ethanol     Status: None   Collection Time: 11/13/15  6:34 PM  Result Value Ref Range   Alcohol, Ethyl (B) <5 <5 mg/dL    Comment:        LOWEST DETECTABLE LIMIT FOR SERUM ALCOHOL IS 5 mg/dL FOR MEDICAL PURPOSES ONLY   Rapid urine drug screen (hospital performed)     Status: Abnormal   Collection Time: 11/13/15  6:40 PM  Result Value Ref Range   Opiates POSITIVE (A) NONE DETECTED   Cocaine POSITIVE (A) NONE DETECTED   Benzodiazepines POSITIVE (A) NONE DETECTED   Amphetamines NONE DETECTED NONE DETECTED   Tetrahydrocannabinol POSITIVE (A) NONE DETECTED   Barbiturates NONE DETECTED NONE DETECTED    Comment:        DRUG SCREEN FOR MEDICAL PURPOSES ONLY.  IF CONFIRMATION IS NEEDED FOR ANY PURPOSE, NOTIFY LAB WITHIN 5 DAYS.        LOWEST DETECTABLE LIMITS FOR URINE DRUG SCREEN Drug Class       Cutoff (ng/mL) Amphetamine      1000 Barbiturate      200 Benzodiazepine   219 Tricyclics       758 Opiates          300 Cocaine          300 THC              50   POC urine preg, ED (not at [facility])     Status: None   Collection Time: 11/13/15  6:44 PM  Result Value Ref Range   Preg Test, Ur NEGATIVE NEGATIVE    Comment:        THE SENSITIVITY OF THIS METHODOLOGY IS >24 mIU/mL     Current Facility-Administered Medications  Medication Dose Route Frequency Provider Last Rate Last Dose  . acetaminophen (TYLENOL) tablet 650 mg  650 mg Oral Q6H PRN Daleen Bo, MD   650 mg at 11/14/15 0928  . cloNIDine (CATAPRES) tablet 0.1 mg  0.1 mg Oral QID Lurena Nida, NP   0.1 mg at 11/14/15 1053   Followed by  . [START ON 11/16/2015] cloNIDine (CATAPRES) tablet 0.1 mg  0.1 mg Oral BH-qamhs Lurena Nida, NP       Followed by  . [START ON 11/18/2015] cloNIDine (CATAPRES) tablet 0.1 mg  0.1 mg Oral QAC breakfast Lurena Nida, NP      . dicyclomine (BENTYL) tablet 20 mg  20 mg Oral Q6H PRN Lurena Nida, NP      . DULoxetine (CYMBALTA) DR capsule 30 mg  30 mg Oral Daily Joselynn Amoroso, MD      . gabapentin (NEURONTIN) capsule 200 mg  200 mg Oral TID Corena Pilgrim, MD      . hydrOXYzine (ATARAX/VISTARIL) tablet 25 mg  25 mg Oral Q6H PRN Lurena Nida, NP   25 mg at 11/14/15 7341  . loperamide (IMODIUM) capsule 2-4 mg  2-4 mg Oral PRN Lurena Nida, NP      . methocarbamol (ROBAXIN) tablet 500 mg  500 mg Oral Q8H PRN Lurena Nida, NP   500 mg at 11/14/15 1053  . ondansetron (ZOFRAN-ODT) disintegrating tablet 4 mg  4 mg Oral Q6H PRN Lurena Nida, NP      . traZODone (DESYREL) tablet 100 mg  100 mg Oral QHS PRN,MR X 1 Drayton Tieu, MD       Current Outpatient Prescriptions  Medication Sig Dispense Refill  . DULoxetine (CYMBALTA) 30 MG capsule Take 1 capsule (30 mg total) by mouth daily. 30 capsule 0  . gabapentin (NEURONTIN) 100 MG capsule Take 2 capsules (200 mg total) by mouth 3 (three) times daily. 90 capsule 0  . nicotine (NICODERM CQ - DOSED IN MG/24 HOURS) 14 mg/24hr patch Place 1 patch (14 mg total) onto the skin daily. 28 patch 0  . traZODone (DESYREL) 100 MG tablet Take 1 tablet (100 mg total) by mouth at bedtime as needed and may repeat dose one time if needed for sleep. 30 tablet 0  . metroNIDAZOLE (FLAGYL) 500 MG tablet Take 1 tablet (500 mg total) by mouth 2 (two) times daily. (Patient not taking: Reported on 11/13/2015) 10 tablet 0  . sulfamethoxazole-trimethoprim (BACTRIM DS,SEPTRA DS) 800-160 MG tablet Take 1 tablet by mouth every 12 (twelve) hours. (Patient not taking: Reported on 11/13/2015) 6 tablet 0    Musculoskeletal: Strength & Muscle Tone: within normal limits Gait & Station: normal Patient leans: N/A  Psychiatric Specialty Exam: Physical Exam  Psychiatric:  Her speech is normal. Her mood appears anxious. She is actively hallucinating. Thought content is paranoid. Cognition and memory are normal. She expresses impulsivity. She exhibits a depressed mood.    Review of Systems  Constitutional: Positive for chills and malaise/fatigue.  HENT: Negative.   Eyes: Negative.   Respiratory: Negative.   Cardiovascular: Negative.   Gastrointestinal: Positive for nausea and abdominal pain.  Genitourinary: Negative.   Musculoskeletal: Positive for myalgias.  Skin: Negative.   Neurological: Positive for tremors.  Endo/Heme/Allergies: Negative.   Psychiatric/Behavioral: Positive for depression, hallucinations and substance abuse. The patient is nervous/anxious and has insomnia.     Blood pressure 125/81, pulse 89, temperature 98.6 F (37 C), temperature source Oral, resp. rate 18, last menstrual  period 10/31/2015, SpO2 99 %, not currently breastfeeding.There is no weight on file to calculate BMI.  General Appearance: Disheveled  Eye Contact:  Good  Speech:  Clear and Coherent  Volume:  Decreased  Mood:  Anxious, Depressed and Hopeless  Affect:  Constricted and Tearful  Thought Process:  Coherent and Descriptions of Associations: Intact  Orientation:  Full (Time, Place, and Person)  Thought Content:  Delusions, Hallucinations: Auditory Visual and Paranoid Ideation  Suicidal Thoughts:  No  Homicidal Thoughts:  No  Memory:  Immediate;   Good Recent;   Good Remote;   Good  Judgement:  Impaired  Insight:  Lacking  Psychomotor Activity:  Decreased  Concentration:  Concentration: Poor and Attention Span: Poor  Recall:  Good  Fund of Knowledge:  Good  Language:  Good  Akathisia:  No  Handed:  Right  AIMS (if indicated):     Assets:  Communication Skills Desire for Improvement  ADL's:  Intact  Cognition:  WNL  Sleep:   poor     Treatment Plan Summary: Plan: -Crisis stabilization -Daily contact with patient to assess and evaluate symptoms and  progress in treatment, Medication management. -Start Clonidine detox for opioid abuse -Gabapentin 200 tid for alcohol/cocaine -Trazodone 151m po Qhs prn for insomnia -Continue Cymbalta 383mdaily for depression.   Disposition: Recommend psychiatric Inpatient admission when medically cleared. Supportive therapy provided about ongoing stressors. needs dual diagnosis inpatient psychiatric hospital for stabilization  AkCorena PilgrimMD 11/14/2015 11:32 AM

## 2015-11-14 NOTE — ED Notes (Signed)
MD at bedside. 

## 2015-11-15 MED ORDER — HYDROXYZINE HCL 25 MG PO TABS
50.0000 mg | ORAL_TABLET | Freq: Two times a day (BID) | ORAL | Status: DC
  Administered 2015-11-15 – 2015-11-16 (×2): 50 mg via ORAL
  Filled 2015-11-15 (×2): qty 2

## 2015-11-15 NOTE — ED Notes (Signed)
Pt given hygiene items and encouraged to take a shower.

## 2015-11-15 NOTE — ED Notes (Signed)
Patient noted in room. No complaints, stable, in no acute distress. Q15 minute rounds and monitoring via security cameras continue for safety. 

## 2015-11-15 NOTE — BH Assessment (Signed)
[place] @ Capacity. Pt declined.

## 2015-11-15 NOTE — Progress Notes (Signed)
Reassessment:   Patient was sleeping initially and only able to answer minimum questions.  She denies current symptoms of SI, HI, A/VH.  Patient only reports body aches.  Patient requested food and this was communicated to her nurse.      Maryelizabeth Rowan, MSW, [facility] Triage Specialist [phone]

## 2015-11-15 NOTE — ED Notes (Signed)
Pt compliant with morning medication regimen. Cooperative. Pt denies SI/HI. Pt c/o withdrawal symptoms including anxiety and generalized body pain. Pt reports that she was recently discharged from [facility] and that she wants to go back. Special checks q 15 mins in place for safety. Video monitoring in place.

## 2015-11-15 NOTE — ED Notes (Signed)
Pt demanding Ativan medication. Yelling at this nurse. MD notified of pt wanting Ativan medication.

## 2015-11-16 DIAGNOSIS — F1120 Opioid dependence, uncomplicated: Secondary | ICD-10-CM | POA: Diagnosis not present

## 2015-11-16 MED ORDER — DULOXETINE HCL 30 MG PO CPEP: 30.0000 mg | ORAL_CAPSULE | Freq: Every day | ORAL | 0 refills | Status: DC

## 2015-11-16 MED ORDER — GABAPENTIN 100 MG PO CAPS: 200.0000 mg | ORAL_CAPSULE | Freq: Three times a day (TID) | ORAL | 0 refills | Status: DC

## 2015-11-16 MED ORDER — HYDROXYZINE HCL 50 MG PO TABS: 50.0000 mg | ORAL_TABLET | Freq: Two times a day (BID) | ORAL | 0 refills | Status: DC

## 2015-11-16 MED ORDER — TRAZODONE HCL 100 MG PO TABS: 100.0000 mg | ORAL_TABLET | Freq: Every evening | ORAL | 0 refills | Status: DC | PRN

## 2015-11-16 NOTE — BH Assessment (Addendum)
[facility] Assessment Progress Note  Per Thedore Mins, MD, this pt does not require psychiatric hospitalization at this time.  Pt is to be discharged from [facility] with outpatient referrals.  This Clinical research associate checked discharge instructions from pt's recent admission to [facility]  These included [facility], [facility], and the [facility] and [facility].  These have been included in pt's current discharge instructions.  Pt's nurse, Dawnaly, has been notified.  Doylene Canning, MA Triage Specialist [phone]   Addendum:  Rudean Hitt reports that pt also requests substance abuse treatment referral information.  Information for [facility], [facility], and [facility] has been added to pt's discharge instructions.  Doylene Canning, MA Triage Specialist [phone]

## 2015-11-16 NOTE — Consult Note (Signed)
[facility] Face-to-Face Psychiatry Consult   Reason for Consult:  Cocaine abuse with psychosis Referring Physician:  EDP Patient Identification: Kathy CHAR MRN:  [id number] Principal Diagnosis: Polysubstance dependence including opioid type drug, continuous use [facility]) Diagnosis:   Patient Active Problem List   Diagnosis Date Noted  . Cocaine abuse with cocaine-induced mood disorder [facility]) [F14.14] 03/27/2015    Priority: High  . Cocaine abuse [F14.10] 08/07/2013    Priority: Low  . MDD (major depressive disorder), recurrent, severe, with psychosis (HCC) [F33.3] 03/28/2015  . Normal labor [O80, Z37.9] 12/10/2013  . Opiate dependence, continuous (HCC) [F11.20] 08/07/2013  . Nausea/vomiting in pregnancy [O21.9] 07/08/2013  . Pica [F50.89] 07/08/2013  . Polysubstance dependence including opioid type drug, continuous use (HCC) [F11.20, F19.20] 06/17/2013  . PTSD (post-traumatic stress disorder) [F43.10] 06/17/2013  . Unspecified vitamin D deficiency [E55.9] 06/04/2013  . Sickle cell trait (HCC) [D57.3] 06/04/2013  . Trichomonal vaginitis in pregnancy in second trimester [O23.592, A59.01] 06/02/2013  . Polysubstance abuse [F19.10] 05/31/2013  . Tobacco use complicating pregnancy [O99.330] 05/30/2013  . Unspecified high-risk pregnancy [O09.90] 05/30/2013  . Marijuana use [F12.10] 05/27/2013  . Opioid dependence (HCC) [F11.20] 09/09/2011  . Pelvic pain in female [R10.2] 12/07/2010  . DUB (dysfunctional uterine bleeding) [N93.8] 12/07/2010  . History of PID [Z87.42] 12/07/2010    Total Time spent with patient: 30 minutes  Subjective:   [name] is a 30 y.o. female patient does not warrant admission.  HPI:  30 yo female who presented under the influence of many substances and having psychosis on 7/21.  Today, she is clear and coherent.  Denies hallucinations, suicidal/homicidal ideations, and withdrawal symptoms.  She is calm and cooperative, stable for discharge.  Past Psychiatric  History: substance abuse, depression  Risk to Self: Suicidal Ideation: No Suicidal Intent: No Is patient at risk for suicide?: No Suicidal Plan?: No Access to Means: No What has been your use of drugs/alcohol within the last 12 months?: Current use How many times?:  (pt doen't remember) Other Self Harm Risks:  (None) Triggers for Past Attempts: Other (Comment) (Family issues) Intentional Self Injurious Behavior: None (pt stated in the past) Comment - Self Injurious Behavior: cutting (in the past) Risk to Others: Homicidal Ideation: No Thoughts of Harm to Others: No Current Homicidal Intent: No Current Homicidal Plan: No Access to Homicidal Means: No Identified Victim:  (NA) History of harm to others?: No Assessment of Violence: None Noted Violent Behavior Description:  (NA) Does patient have access to weapons?: No Criminal Charges Pending?: No Does patient have a court date: No Prior Inpatient Therapy: Prior Inpatient Therapy: Yes Prior Therapy Dates: 2017 Prior Therapy Facilty/Provider(s): [facility] Reason for Treatment: SA use  Prior Outpatient Therapy: Prior Outpatient Therapy: Yes Prior Therapy Dates: 2017 Prior Therapy Facilty/Provider(s): [facility] Reason for Treatment: SA use Does patient have an ACCT team?: No Does patient have Intensive In-House Services?  : No Does patient have [facility] services? : No Does patient have P4CC services?: No  Past Medical History:  Past Medical History:  Diagnosis Date  . Bartholin cyst   . Endometriosis   . Glaucoma   . Headache(784.0)   . History of PID   . Infection    UTI  . Ovarian cyst   . STD (female)    hx of chlamydia and gonorrhea  . Substance abuse   . Vaginal Pap smear, abnormal    has not followed up    Past Surgical History:  Procedure Laterality Date  .  DILATION AND CURETTAGE OF UTERUS    . FRACTURE SURGERY     left leg  . LAPAROSCOPY     Family History:  Family History  Problem Relation Age of Onset  .  Anesthesia problems Neg Hx   . Hypotension Neg Hx   . Malignant hyperthermia Neg Hx   . Pseudochol deficiency Neg Hx   . Alcohol abuse Neg Hx   . Diabetes Mother   . Hypertension Maternal Grandmother   . Heart disease Maternal Grandmother     great grandma   Family Psychiatric  History: mother-substance abuse Social History:  History  Alcohol Use No    Comment: Denies ETOH use     History  Drug Use  . Types: Marijuana, Cocaine, Heroin    Comment: heroin    Social History   Social History  . Marital status: Single    Spouse name: N/A  . Number of children: N/A  . Years of education: N/A   Social History Main Topics  . Smoking status: Current Every Day Smoker    Packs/day: 0.25    Years: 7.00  . Smokeless tobacco: Never Used  . Alcohol use No     Comment: Denies ETOH use  . Drug use:     Types: Marijuana, Cocaine, Heroin     Comment: heroin  . Sexual activity: Not Asked     Comment: Pt currently [redacted] weeks pregnant   Other Topics Concern  . None   Social History Narrative  . None   Additional Social History:    Allergies:   Allergies  Allergen Reactions  . Acyclovir And Related Swelling  . Darvocet [Propoxyphene N-Acetaminophen] Hives  . Doxycycline Swelling  . Flexeril [Cyclobenzaprine] Nausea And Vomiting  . Metoclopramide Hives  . Naproxen Hives    Pt is able to take ibuprofen without problems  . Latex Rash  . Tramadol Rash    Labs:  Results for orders placed or performed during the hospital encounter of 11/13/15 (from the past 48 hour(s))  CK     Status: Abnormal   Collection Time: 11/14/15 11:20 AM  Result Value Ref Range   Total CK 375 (H) 38 - 234 U/L    Current Facility-Administered Medications  Medication Dose Route Frequency Provider Last Rate Last Dose  . acetaminophen (TYLENOL) tablet 650 mg  650 mg Oral Q6H PRN Mancel Bale, MD   650 mg at 11/15/15 7829  . cloNIDine (CATAPRES) tablet 0.1 mg  0.1 mg Oral BH-qamhs Kristeen Mans, NP    0.1 mg at 11/16/15 1009   Followed by  . [START ON 11/18/2015] cloNIDine (CATAPRES) tablet 0.1 mg  0.1 mg Oral QAC breakfast Kristeen Mans, NP      . dicyclomine (BENTYL) tablet 20 mg  20 mg Oral Q6H PRN Kristeen Mans, NP   20 mg at 11/14/15 2144  . DULoxetine (CYMBALTA) DR capsule 30 mg  30 mg Oral Daily Ishana Blades, MD   30 mg at 11/16/15 1009  . gabapentin (NEURONTIN) capsule 200 mg  200 mg Oral TID Thedore Mins, MD   200 mg at 11/16/15 1010  . hydrOXYzine (ATARAX/VISTARIL) tablet 50 mg  50 mg Oral BID Kristeen Mans, NP   50 mg at 11/16/15 1010  . loperamide (IMODIUM) capsule 2-4 mg  2-4 mg Oral PRN Kristeen Mans, NP      . methocarbamol (ROBAXIN) tablet 500 mg  500 mg Oral Q8H PRN Kristeen Mans, NP   500  mg at 11/16/15 0755  . ondansetron (ZOFRAN-ODT) disintegrating tablet 4 mg  4 mg Oral Q6H PRN Kristeen Mans, NP      . traZODone (DESYREL) tablet 100 mg  100 mg Oral QHS PRN,MR X 1 Liela Rylee, MD   100 mg at 11/15/15 2121   Current Outpatient Prescriptions  Medication Sig Dispense Refill  . DULoxetine (CYMBALTA) 30 MG capsule Take 1 capsule (30 mg total) by mouth daily. 30 capsule 0  . gabapentin (NEURONTIN) 100 MG capsule Take 2 capsules (200 mg total) by mouth 3 (three) times daily. 90 capsule 0  . nicotine (NICODERM CQ - DOSED IN MG/24 HOURS) 14 mg/24hr patch Place 1 patch (14 mg total) onto the skin daily. 28 patch 0  . traZODone (DESYREL) 100 MG tablet Take 1 tablet (100 mg total) by mouth at bedtime as needed and may repeat dose one time if needed for sleep. 30 tablet 0  . metroNIDAZOLE (FLAGYL) 500 MG tablet Take 1 tablet (500 mg total) by mouth 2 (two) times daily. (Patient not taking: Reported on 11/13/2015) 10 tablet 0  . sulfamethoxazole-trimethoprim (BACTRIM DS,SEPTRA DS) 800-160 MG tablet Take 1 tablet by mouth every 12 (twelve) hours. (Patient not taking: Reported on 11/13/2015) 6 tablet 0    Musculoskeletal: Strength & Muscle Tone: within normal limits Gait &  Station: normal Patient leans: N/A  Psychiatric Specialty Exam: Physical Exam  Constitutional: She is oriented to person, place, and time. She appears well-developed and well-nourished.  HENT:  Head: Normocephalic.  Neck: Normal range of motion.  Respiratory: Effort normal.  Musculoskeletal: Normal range of motion.  Neurological: She is alert and oriented to person, place, and time.  Skin: Skin is warm and dry.  Psychiatric: Her speech is normal and behavior is normal. Judgment and thought content normal. Her mood appears anxious. Cognition and memory are normal.    Review of Systems  Constitutional: Negative.   HENT: Negative.   Eyes: Negative.   Respiratory: Negative.   Cardiovascular: Negative.   Gastrointestinal: Negative.   Genitourinary: Negative.   Musculoskeletal: Negative.   Skin: Negative.   Neurological: Negative.   Endo/Heme/Allergies: Negative.   Psychiatric/Behavioral: Positive for substance abuse. The patient is nervous/anxious.     Blood pressure (!) 84/47, pulse (!) 42, temperature 97.7 F (36.5 C), temperature source Oral, resp. rate 16, last menstrual period 10/31/2015, SpO2 99 %, not currently breastfeeding.There is no height or weight on file to calculate BMI.  General Appearance: Casual  Eye Contact:  Good  Speech:  Normal Rate  Volume:  Normal  Mood:  Anxious  Affect:  Congruent  Thought Process:  Coherent and Descriptions of Associations: Intact  Orientation:  Full (Time, Place, and Person)  Thought Content:  WDL  Suicidal Thoughts:  No  Homicidal Thoughts:  No  Memory:  Immediate;   Good Recent;   Good Remote;   Good  Judgement:  Fair  Insight:  Fair  Psychomotor Activity:  Normal  Concentration:  Concentration: Good and Attention Span: Good  Recall:  Good  Fund of Knowledge:  Fair  Language:  Good  Akathisia:  No  Handed:  Right  AIMS (if indicated):     Assets:  Leisure Time Physical Health Resilience  ADL's:  Intact  Cognition:   WNL  Sleep:        Treatment Plan Summary: Daily contact with patient to assess and evaluate symptoms and progress in treatment, Medication management and Plan cocaine abuse with cocaine induced mood disorder:  -  Crisis stabilization -Medication management:  Continue her Cymbalta 30 mg daily for depression, gabapentin 200 mg BID for substance abuse, Vistaril 50 mg BID for anxiety, and Trazodone 100 mg at bedtime for sleep. -Individual and substance abuse counseling  Disposition: No evidence of imminent risk to self or others at present.    Nanine Means, NP 11/16/2015 10:29 AM   Patient seen face-to-face for psychiatric evaluation, chart reviewed and case discussed with the physician extender and developed treatment plan. Reviewed the information documented and agree with the treatment plan. Thedore Mins, MD

## 2015-11-16 NOTE — Discharge Instructions (Addendum)
Follow-up Information    Follow up with [facility]      Contact information:   [address] [place], [place] [address] Phone: [phone] Fax: [phone]      Follow up with [facility].     Contact information:     [address], [place] [address]      Phone: [phone]    Follow up with [facility]. Schedule an appointment as soon as possible for a visit in 1 week.   Why:  F/u labs    Contact information:   [address] [place] [address] [phone]    To help you maintain a sober lifestyle, a substance abuse treatment program may be beneficial to you.  Contact the following treatment providers to ask about enrolling in their program:  RESIDENTIAL PROGRAMS:       [facility]      [address] [place], [place] [address]      [phone]       [facility]      [address] [place], [place] [address]      [phone]   OUTPATIENT PROGRAMS:       [facility]      [address] [place]      [place], [place] [address]      [phone]      New patients are seen at the walk-in clinic every Tuesday from 9:00 am - 12:00 pm.

## 2015-11-16 NOTE — [facility] Suicide Risk Assessment (Signed)
Suicide Risk Assessment  Discharge Assessment   [facility] Discharge Suicide Risk Assessment   Principal Problem: Polysubstance dependence including opioid type drug, continuous use [facility]) Discharge Diagnoses:  Patient Active Problem List   Diagnosis Date Noted  . Cocaine abuse with cocaine-induced mood disorder [facility]) [F14.14] 03/27/2015    Priority: High  . Cocaine abuse [F14.10] 08/07/2013    Priority: Low  . MDD (major depressive disorder), recurrent, severe, with psychosis (HCC) [F33.3] 03/28/2015  . Normal labor [O80, Z37.9] 12/10/2013  . Opiate dependence, continuous (HCC) [F11.20] 08/07/2013  . Nausea/vomiting in pregnancy [O21.9] 07/08/2013  . Pica [F50.89] 07/08/2013  . Polysubstance dependence including opioid type drug, continuous use (HCC) [F11.20, F19.20] 06/17/2013  . PTSD (post-traumatic stress disorder) [F43.10] 06/17/2013  . Unspecified vitamin D deficiency [E55.9] 06/04/2013  . Sickle cell trait (HCC) [D57.3] 06/04/2013  . Trichomonal vaginitis in pregnancy in second trimester [O23.592, A59.01] 06/02/2013  . Polysubstance abuse [F19.10] 05/31/2013  . Tobacco use complicating pregnancy [O99.330] 05/30/2013  . Unspecified high-risk pregnancy [O09.90] 05/30/2013  . Marijuana use [F12.10] 05/27/2013  . Opioid dependence (HCC) [F11.20] 09/09/2011  . Pelvic pain in female [R10.2] 12/07/2010  . DUB (dysfunctional uterine bleeding) [N93.8] 12/07/2010  . History of PID [Z87.42] 12/07/2010    Total Time spent with patient: 30 minutes  Musculoskeletal: Strength & Muscle Tone: within normal limits Gait & Station: normal Patient leans: N/A  Psychiatric Specialty Exam: Physical Exam  Constitutional: She is oriented to person, place, and time. She appears well-developed and well-nourished.  HENT:  Head: Normocephalic.  Neck: Normal range of motion.  Respiratory: Effort normal.  Musculoskeletal: Normal range of motion.  Neurological: She is alert and oriented to person,  place, and time.  Skin: Skin is warm and dry.  Psychiatric: Her speech is normal and behavior is normal. Judgment and thought content normal. Her mood appears anxious. Cognition and memory are normal.    Review of Systems  Constitutional: Negative.   HENT: Negative.   Eyes: Negative.   Respiratory: Negative.   Cardiovascular: Negative.   Gastrointestinal: Negative.   Genitourinary: Negative.   Musculoskeletal: Negative.   Skin: Negative.   Neurological: Negative.   Endo/Heme/Allergies: Negative.   Psychiatric/Behavioral: Positive for substance abuse. The patient is nervous/anxious.     Blood pressure (!) 84/47, pulse (!) 42, temperature 97.7 F (36.5 C), temperature source Oral, resp. rate 16, last menstrual period 10/31/2015, SpO2 99 %, not currently breastfeeding.There is no height or weight on file to calculate BMI.  General Appearance: Casual  Eye Contact:  Good  Speech:  Normal Rate  Volume:  Normal  Mood:  Anxious  Affect:  Congruent  Thought Process:  Coherent and Descriptions of Associations: Intact  Orientation:  Full (Time, Place, and Person)  Thought Content:  WDL  Suicidal Thoughts:  No  Homicidal Thoughts:  No  Memory:  Immediate;   Good Recent;   Good Remote;   Good  Judgement:  Fair  Insight:  Fair  Psychomotor Activity:  Normal  Concentration:  Concentration: Good and Attention Span: Good  Recall:  Good  Fund of Knowledge:  Fair  Language:  Good  Akathisia:  No  Handed:  Right  AIMS (if indicated):     Assets:  Leisure Time Physical Health Resilience  ADL's:  Intact  Cognition:  WNL  Sleep:       Mental Status Per Nursing Assessment::   On Admission:   substance abuse with psychosis  Demographic Factors:  Adolescent or young adult  Loss  Factors: NA  Historical Factors: Family history of mental illness or substance abuse  Risk Reduction Factors:   Sense of responsibility to family, Living with another person, especially a relative,  Positive social support and Positive therapeutic relationship  Continued Clinical Symptoms:  Anxiety, mild  Cognitive Features That Contribute To Risk:  None    Suicide Risk:  Minimal: No identifiable suicidal ideation.  Patients presenting with no risk factors but with morbid ruminations; may be classified as minimal risk based on the severity of the depressive symptoms    Plan Of Care/Follow-up recommendations:  Activity:  as tolerated  Diet:  heart healthy diet  LORD, JAMISON, NP 11/16/2015, 10:36 AM

## 2015-11-16 NOTE — ED Notes (Signed)
Patient discharged to home.  Left the unit ambulatory with all belongings.  All belongings signed for.  She denies thoughts of harm to self or others.  She was escorted to the front lobby and given a bus pass.

## 2016-02-21 ENCOUNTER — Inpatient Hospital Stay (HOSPITAL_COMMUNITY): Payer: Medicaid Other

## 2016-02-21 ENCOUNTER — Encounter (HOSPITAL_COMMUNITY): Payer: Self-pay

## 2016-02-21 ENCOUNTER — Inpatient Hospital Stay (HOSPITAL_COMMUNITY)
Admission: AD | Admit: 2016-02-21 | Discharge: 2016-02-21 | Disposition: A | Discharge status: Home / Self Care | Payer: Medicaid Other | Source: Ambulatory Visit | Attending: Obstetrics & Gynecology | Admitting: Obstetrics & Gynecology

## 2016-02-21 DIAGNOSIS — Z3491 Encounter for supervision of normal pregnancy, unspecified, first trimester: Secondary | ICD-10-CM

## 2016-02-21 DIAGNOSIS — O99331 Smoking (tobacco) complicating pregnancy, first trimester: Secondary | ICD-10-CM | POA: Insufficient documentation

## 2016-02-21 DIAGNOSIS — O26891 Other specified pregnancy related conditions, first trimester: Secondary | ICD-10-CM | POA: Diagnosis not present

## 2016-02-21 DIAGNOSIS — R109 Unspecified abdominal pain: Secondary | ICD-10-CM

## 2016-02-21 DIAGNOSIS — O26899 Other specified pregnancy related conditions, unspecified trimester: Secondary | ICD-10-CM

## 2016-02-21 DIAGNOSIS — Z3A01 Less than 8 weeks gestation of pregnancy: Secondary | ICD-10-CM | POA: Insufficient documentation

## 2016-02-21 DIAGNOSIS — F17210 Nicotine dependence, cigarettes, uncomplicated: Secondary | ICD-10-CM | POA: Diagnosis not present

## 2016-02-21 DIAGNOSIS — O209 Hemorrhage in early pregnancy, unspecified: Secondary | ICD-10-CM | POA: Diagnosis not present

## 2016-02-21 DIAGNOSIS — O2341 Unspecified infection of urinary tract in pregnancy, first trimester: Secondary | ICD-10-CM | POA: Diagnosis not present

## 2016-02-21 DIAGNOSIS — O210 Mild hyperemesis gravidarum: Secondary | ICD-10-CM | POA: Diagnosis not present

## 2016-02-21 DIAGNOSIS — O219 Vomiting of pregnancy, unspecified: Secondary | ICD-10-CM

## 2016-02-21 LAB — URINALYSIS, ROUTINE W REFLEX MICROSCOPIC
Bilirubin Urine: NEGATIVE
GLUCOSE, UA: NEGATIVE mg/dL
Ketones, ur: NEGATIVE mg/dL
Nitrite: POSITIVE — AB
PH: 5.5 (ref 5.0–8.0)
PROTEIN: NEGATIVE mg/dL
SPECIFIC GRAVITY, URINE: 1.025 (ref 1.005–1.030)

## 2016-02-21 LAB — CBC
HCT: 34.8 % — ABNORMAL LOW (ref 36.0–46.0)
HEMOGLOBIN: 12.3 g/dL (ref 12.0–15.0)
MCH: 30.4 pg (ref 26.0–34.0)
MCHC: 35.3 g/dL (ref 30.0–36.0)
MCV: 86.1 fL (ref 78.0–100.0)
Platelets: 377 10*3/uL (ref 150–400)
RBC: 4.04 MIL/uL (ref 3.87–5.11)
RDW: 14.4 % (ref 11.5–15.5)
WBC: 7.9 10*3/uL (ref 4.0–10.5)

## 2016-02-21 LAB — WET PREP, GENITAL
Sperm: NONE SEEN
Trich, Wet Prep: NONE SEEN
YEAST WET PREP: NONE SEEN

## 2016-02-21 LAB — URINE MICROSCOPIC-ADD ON

## 2016-02-21 LAB — HCG, QUANTITATIVE, PREGNANCY: HCG, BETA CHAIN, QUANT, S: 26015 m[IU]/mL — AB (ref <5)

## 2016-02-21 LAB — POCT PREGNANCY, URINE: PREG TEST UR: POSITIVE — AB

## 2016-02-21 MED ORDER — CEPHALEXIN 500 MG PO CAPS: 500.0000 mg | ORAL_CAPSULE | Freq: Four times a day (QID) | ORAL | 0 refills | Status: DC

## 2016-02-21 MED ORDER — PROMETHAZINE HCL 25 MG PO TABS: 25.0000 mg | ORAL_TABLET | Freq: Four times a day (QID) | ORAL | 0 refills | Status: DC | PRN

## 2016-02-21 NOTE — MAU Note (Signed)
Spoke with patient very tearful upset that her boyfriend seems unhappy with pregnancy, patient needed to get breathe of fresh air.  I encouraged her to take a minute and come back so we can make sure she is okay and that the pregnancy is okay.

## 2016-02-21 NOTE — MAU Note (Signed)
Back pain x 3-4 days but has a history of pid.   Started spotting x1 day last week then stopped. Then 02/19/16 a few more spots of blood with wiping.  Breast are leaking milk.  Throwing up x2 -3 weeks

## 2016-02-21 NOTE — MAU Note (Signed)
Spoke with patient very tearful upset that her boyfriend seems unhappy with pregnancy, patient needed to get breath of fresh air.  I encouraged her to take a minute and come back so we can make sure she is okay and that the pregnancy is okay.

## 2016-02-21 NOTE — Discharge Instructions (Signed)
Pregnancy and Urinary Tract Infection °A urinary tract infection (UTI) is a bacterial infection of the urinary tract. Infection of the urinary tract can include the ureters, kidneys (pyelonephritis), bladder (cystitis), and urethra (urethritis). All pregnant women should be screened for bacteria in the urinary tract. Identifying and treating a UTI will decrease the risk of preterm labor and developing more serious infections in both the mother and baby. °CAUSES °Bacteria germs cause almost all UTIs.  °RISK FACTORS °Many factors can increase your chances of getting a UTI during pregnancy. These include: °· Having a short urethra. °· Poor toilet and hygiene habits. °· Sexual intercourse. °· Blockage of urine along the urinary tract. °· Problems with the pelvic muscles or nerves. °· Diabetes. °· Obesity. °· Bladder problems after having several children. °· Previous history of UTI. °SIGNS AND SYMPTOMS  °· Pain, burning, or a stinging feeling when urinating. °· Suddenly feeling the need to urinate right away (urgency). °· Loss of bladder control (urinary incontinence). °· Frequent urination, more than is common with pregnancy. °· Lower abdominal or back discomfort. °· Cloudy urine. °· Blood in the urine (hematuria). °· Fever.  °When the kidneys are infected, the symptoms may be: °· Back pain. °· Flank pain on the right side more so than the left. °· Fever. °· Chills. °· Nausea. °· Vomiting. °DIAGNOSIS  °A urinary tract infection is usually diagnosed through urine tests. Additional tests and procedures are sometimes done. These may include: °· Ultrasound exam of the kidneys, ureters, bladder, and urethra. °· Looking in the bladder with a lighted tube (cystoscopy). °TREATMENT °Typically, UTIs can be treated with antibiotic medicines.  °HOME CARE INSTRUCTIONS  °· Only take over-the-counter or prescription medicines as directed by your health care provider. If you were prescribed antibiotics, take them as directed. Finish  them even if you start to feel better. °· Drink enough fluids to keep your urine clear or pale yellow. °· Do not have sexual intercourse until the infection is gone and your health care provider says it is okay. °· Make sure you are tested for UTIs throughout your pregnancy. These infections often come back.  °Preventing a UTI in the Future °· Practice good toilet habits. Always wipe from front to back. Use the tissue only once. °· Do not hold your urine. Empty your bladder as soon as possible when the urge comes. °· Do not douche or use deodorant sprays. °· Wash with soap and warm water around the genital area and the anus. °· Empty your bladder before and after sexual intercourse. °· Wear underwear with a cotton crotch. °· Avoid caffeine and carbonated drinks. They can irritate the bladder. °· Drink cranberry juice or take cranberry pills. This may decrease the risk of getting a UTI. °· Do not drink alcohol. °· Keep all your appointments and tests as scheduled.  °SEEK MEDICAL CARE IF:  °· Your symptoms get worse. °· You are still having fevers 2 or more days after treatment begins. °· You have a rash. °· You feel that you are having problems with medicines prescribed. °· You have abnormal vaginal discharge. °SEEK IMMEDIATE MEDICAL CARE IF:  °· You have back or flank pain. °· You have chills. °· You have blood in your urine. °· You have nausea and vomiting. °· You have contractions of your uterus. °· You have a gush of fluid from the vagina. °MAKE SURE YOU: °· Understand these instructions.   °· Will watch your condition.   °· Will get help right away if you are not doing   well or get worse.    This information is not intended to replace advice given to you by your health care provider. Make sure you discuss any questions you have with your health care provider.   Document Released: 08/06/2010 Document Revised: 01/30/2013 Document Reviewed: 11/08/2012 Elsevier Interactive Patient Education 2016 Elsevier  Inc.     Morning Sickness Morning sickness is when you feel sick to your stomach (nauseous) during pregnancy. This nauseous feeling may or may not come with vomiting. It often occurs in the morning but can be a problem any time of day. Morning sickness is most common during the first trimester, but it may continue throughout pregnancy. While morning sickness is unpleasant, it is usually harmless unless you develop severe and continual vomiting (hyperemesis gravidarum). This condition requires more intense treatment.  CAUSES  The cause of morning sickness is not completely known but seems to be related to normal hormonal changes that occur in pregnancy. RISK FACTORS You are at greater risk if you:  Experienced nausea or vomiting before your pregnancy.  Had morning sickness during a previous pregnancy.  Are pregnant with more than one baby, such as twins. TREATMENT  Do not use any medicines (prescription, over-the-counter, or herbal) for morning sickness without first talking to your health care provider. Your health care provider may prescribe or recommend:  Vitamin B6 supplements.  Anti-nausea medicines.  The herbal medicine ginger. HOME CARE INSTRUCTIONS   Only take over-the-counter or prescription medicines as directed by your health care provider.  Taking multivitamins before getting pregnant can prevent or decrease the severity of morning sickness in most women.  Eat a piece of dry toast or unsalted crackers before getting out of bed in the morning.  Eat five or six small meals a day.  Eat dry and bland foods (rice, baked potato). Foods high in carbohydrates are often helpful.  Do not drink liquids with your meals. Drink liquids between meals.  Avoid greasy, fatty, and spicy foods.  Get someone to cook for you if the smell of any food causes nausea and vomiting.  If you feel nauseous after taking prenatal vitamins, take the vitamins at night or with a snack.  Snack  on protein foods (nuts, yogurt, cheese) between meals if you are hungry.  Eat unsweetened gelatins for desserts.  Wearing an acupressure wristband (worn for sea sickness) may be helpful.  Acupuncture may be helpful.  Do not smoke.  Get a humidifier to keep the air in your house free of odors.  Get plenty of fresh air. SEEK MEDICAL CARE IF:   Your home remedies are not working, and you need medicine.  You feel dizzy or lightheaded.  You are losing weight. SEEK IMMEDIATE MEDICAL CARE IF:   You have persistent and uncontrolled nausea and vomiting.  You pass out (faint). MAKE SURE YOU:  Understand these instructions.  Will watch your condition.  Will get help right away if you are not doing well or get worse.   This information is not intended to replace advice given to you by your health care provider. Make sure you discuss any questions you have with your health care provider.   Document Released: 06/02/2006 Document Revised: 04/16/2013 Document Reviewed: 09/26/2012 Elsevier Interactive Patient Education [employer]2016 Elsevier Inc.

## 2016-02-21 NOTE — MAU Provider Note (Signed)
History     CSN: [id number]  Arrival date and time: 02/21/16 1439    First Provider Initiated Contact with Patient 02/21/16 1517        Chief Complaint  Patient presents with  . Pelvic Pain   HPI  [name] is a 30 y.o. [phone] at 6020w3d who presents with abdominal pain, n/v, & spotting. Pt reports suprapubic cramping that radiates to lower back. Pain began 4 days ago. Rates pain 5/10. Has not treated. Nothing makes better or worse. Denies dysuria, increased frequency, fever/chills, hematuria, or flank pain.   Also reports small amount of pink spotting a few days ago. No bleeding now. Denies vaginal discharge or irritation.  Nausea & vomiting x 2-3 weeks. Is nauseated today but denies vomiting today. Denies diarrhea or constipation.   OB History    Gravida Para Term Preterm AB Living   5 2 1 1 2 2    SAB TAB Ectopic Multiple Live Births   2 0 0 0 2      Past Medical History:  Diagnosis Date  . Bartholin cyst   . Endometriosis   . Glaucoma   . Headache(784.0)   . History of PID   . Ovarian cyst   . STD (female)    hx of chlamydia and gonorrhea  . Substance abuse   . Vaginal Pap smear, abnormal    has not followed up    Past Surgical History:  Procedure Laterality Date  . DILATION AND CURETTAGE OF UTERUS    . FRACTURE SURGERY     left leg  . LAPAROSCOPY      Family History  Problem Relation Age of Onset  . Diabetes Mother   . Hypertension Maternal Grandmother   . Heart disease Maternal Grandmother     great grandma  . Anesthesia problems Neg Hx   . Hypotension Neg Hx   . Malignant hyperthermia Neg Hx   . Pseudochol deficiency Neg Hx   . Alcohol abuse Neg Hx     Social History  Substance Use Topics  . Smoking status: Current Every Day Smoker    Packs/day: 0.25    Years: 7.00  . Smokeless tobacco: Never Used  . Alcohol use No     Comment: Denies ETOH use    Allergies:  Allergies  Allergen Reactions  . Acyclovir And Related Swelling  .  Darvocet [Propoxyphene N-Acetaminophen] Hives  . Doxycycline Swelling  . Flexeril [Cyclobenzaprine] Nausea And Vomiting  . Metoclopramide Hives  . Naproxen Hives    Pt is able to take ibuprofen without problems  . Latex Rash  . Tramadol Rash    Prescriptions Prior to Admission  Medication Sig Dispense Refill Last Dose  . buprenorphine (SUBUTEX) 8 MG SUBL SL tablet Place 8 mg under the tongue daily.   02/21/2016 at Unknown time  . traZODone (DESYREL) 100 MG tablet Take 1 tablet (100 mg total) by mouth at bedtime as needed and may repeat dose one time if needed for sleep. 30 tablet 0 Past Week at Unknown time  . [DISCONTINUED] metroNIDAZOLE (FLAGYL) 500 MG tablet Take 1 tablet (500 mg total) by mouth 2 (two) times daily. (Patient not taking: Reported on 02/21/2016) 10 tablet 0 Completed Course at Unknown time  . [DISCONTINUED] nicotine (NICODERM CQ - DOSED IN MG/24 HOURS) 14 mg/24hr patch Place 1 patch (14 mg total) onto the skin daily. (Patient not taking: Reported on 02/21/2016) 28 patch 0 Not Taking at Unknown time  . [DISCONTINUED] sulfamethoxazole-trimethoprim (BACTRIM  DS,SEPTRA DS) 800-160 MG tablet Take 1 tablet by mouth every 12 (twelve) hours. (Patient not taking: Reported on 02/21/2016) 6 tablet 0 Completed Course at Unknown time    Review of Systems  Constitutional: Negative.   Gastrointestinal: Positive for abdominal pain, nausea and vomiting. Negative for constipation and diarrhea.  Genitourinary: Negative for dysuria, flank pain, frequency and hematuria.       + pink spotting  Musculoskeletal: Positive for back pain.   Physical Exam   Blood pressure 135/85, pulse 74, temperature 99.1 F (37.3 C), resp. rate 18, height 5\' 4"  (1.626 m), weight 138 lb (62.6 kg), last menstrual period 12/31/2015.  Physical Exam  Nursing note and vitals reviewed. Constitutional: She is oriented to person, place, and time. She appears well-developed and well-nourished. No distress.  HENT:   Head: Normocephalic and atraumatic.  Eyes: Conjunctivae are normal. Right eye exhibits no discharge. Left eye exhibits no discharge. No scleral icterus.  Neck: Normal range of motion.  Cardiovascular: Normal rate, regular rhythm and normal heart sounds.   No murmur heard. Respiratory: Effort normal and breath sounds normal. No respiratory distress. She has no wheezes.  GI: Soft. Bowel sounds are normal. She exhibits no distension. There is tenderness in the suprapubic area. There is no rebound, no guarding and no CVA tenderness.  Genitourinary: Uterus normal. Cervix exhibits no motion tenderness and no friability. Right adnexum displays no mass and no tenderness. Left adnexum displays no mass and no tenderness. No bleeding in the vagina. Vaginal discharge (small amount of thin white discharge, no blood) found.  Genitourinary Comments: Cervix closed  Neurological: She is alert and oriented to person, place, and time.  Skin: Skin is warm and dry. She is not diaphoretic.  Psychiatric: She has a normal mood and affect. Her behavior is normal. Judgment and thought content normal.    MAU Course  Procedures Results for orders placed or performed during the hospital encounter of 02/21/16 (from the past 24 hour(s))  Urinalysis, Routine w reflex microscopic (not at [facility])     Status: Abnormal   Collection Time: 02/21/16  2:46 PM  Result Value Ref Range   Color, Urine YELLOW YELLOW   APPearance HAZY (A) CLEAR   Specific Gravity, Urine 1.025 1.005 - 1.030   pH 5.5 5.0 - 8.0   Glucose, UA NEGATIVE NEGATIVE mg/dL   Hgb urine dipstick SMALL (A) NEGATIVE   Bilirubin Urine NEGATIVE NEGATIVE   Ketones, ur NEGATIVE NEGATIVE mg/dL   Protein, ur NEGATIVE NEGATIVE mg/dL   Nitrite POSITIVE (A) NEGATIVE   Leukocytes, UA MODERATE (A) NEGATIVE  Urine microscopic-add on     Status: Abnormal   Collection Time: 02/21/16  2:46 PM  Result Value Ref Range   Squamous Epithelial / LPF 0-5 (A) NONE SEEN   WBC, UA  6-30 0 - 5 WBC/hpf   RBC / HPF 0-5 0 - 5 RBC/hpf   Bacteria, UA MANY (A) NONE SEEN  Pregnancy, urine POC     Status: Abnormal   Collection Time: 02/21/16  3:00 PM  Result Value Ref Range   Preg Test, Ur POSITIVE (A) NEGATIVE  Wet prep, genital     Status: Abnormal   Collection Time: 02/21/16  3:20 PM  Result Value Ref Range   Yeast Wet Prep HPF POC NONE SEEN NONE SEEN   Trich, Wet Prep NONE SEEN NONE SEEN   Clue Cells Wet Prep HPF POC PRESENT (A) NONE SEEN   WBC, Wet Prep HPF POC MODERATE (A) NONE SEEN  Sperm NONE SEEN   CBC     Status: Abnormal   Collection Time: 02/21/16  3:32 PM  Result Value Ref Range   WBC 7.9 4.0 - 10.5 K/uL   RBC 4.04 3.87 - 5.11 MIL/uL   Hemoglobin 12.3 12.0 - 15.0 g/dL   HCT 16.1 (L) 09.6 - 04.5 %   MCV 86.1 78.0 - 100.0 fL   MCH 30.4 26.0 - 34.0 pg   MCHC 35.3 30.0 - 36.0 g/dL   RDW 40.9 81.1 - 91.4 %   Platelets 377 150 - 400 K/uL  hCG, quantitative, pregnancy     Status: Abnormal   Collection Time: 02/21/16  3:32 PM  Result Value Ref Range   hCG, Beta Chain, Quant, S 26,015 (H) <5 mIU/mL   US Ob Comp Less 14 Wks  Result Date: 02/21/2016 CLINICAL DATA:  Pain and bleeding.  Pregnant female. EXAM: OBSTETRIC <14 WK [place] AND TRANSVAGINAL OB US TECHNIQUE: Both transabdominal and transvaginal ultrasound examinations were performed for complete evaluation of the gestation as well as the maternal uterus, adnexal regions, and pelvic cul-de-sac. Transvaginal technique was performed to assess early pregnancy. COMPARISON:  None. FINDINGS: Intrauterine gestational sac: A single live IUP is identified. Yolk sac:  A single yolk sac is noted. Embryo:  A single embryo is identified. Cardiac Activity: Present. Heart Rate: 143 beats per minute MSD:   mm    w     d CRL:  1.23 cm 7 w   3 d                  [place] EDC: October 06, 2016 Subchorionic hemorrhage: A small subchorionic hemorrhage is identified. Maternal uterus/adnexae: Corpus luteum cyst on the left. IMPRESSION: 1. Single  live IUP with a small subchorionic hemorrhage. Electronically Signed   By: Gerome Sam III M.D   On: 02/21/2016 16:41   US Ob Transvaginal  Result Date: 02/21/2016 CLINICAL DATA:  Pain and bleeding.  Pregnant female. EXAM: OBSTETRIC <14 WK [place] AND TRANSVAGINAL OB US TECHNIQUE: Both transabdominal and transvaginal ultrasound examinations were performed for complete evaluation of the gestation as well as the maternal uterus, adnexal regions, and pelvic cul-de-sac. Transvaginal technique was performed to assess early pregnancy. COMPARISON:  None. FINDINGS: Intrauterine gestational sac: A single live IUP is identified. Yolk sac:  A single yolk sac is noted. Embryo:  A single embryo is identified. Cardiac Activity: Present. Heart Rate: 143 beats per minute MSD:   mm    w     d CRL:  1.23 cm 7 w   3 d                  [place] EDC: October 06, 2016 Subchorionic hemorrhage: A small subchorionic hemorrhage is identified. Maternal uterus/adnexae: Corpus luteum cyst on the left. IMPRESSION: 1. Single live IUP with a small subchorionic hemorrhage. Electronically Signed   By: Gerome Sam III M.D   On: 02/21/2016 16:41    MDM +UPT UA, wet prep, GC/chlamydia, CBC, ABO/Rh, quant hCG, HIV, and [place] today to rule out ectopic pregnancy Ultrasound shows SIUP with cardiac activity & small SCH U/a + nitrites; urine cx sent B positive Assessment and Plan  A: 1. Normal IUP (intrauterine pregnancy) on prenatal ultrasound, first trimester   2. Vaginal bleeding in pregnancy, first trimester   3. Abdominal pain during pregnancy   4. UTI (urinary tract infection) during pregnancy, first trimester   5. Nausea and vomiting during pregnancy prior to [redacted] weeks gestation  P: Discharge home Rx phenergan & keflex GC/CT & urine culture pending Pregnancy verification letter given -- start prenatal care Discussed reasons to return to MAU  Judeth HornErin Cody Oliger 02/21/2016, 3:17 PM

## 2016-02-22 LAB — GC/CHLAMYDIA PROBE AMP (~~LOC~~) NOT AT ARMC
CHLAMYDIA, DNA PROBE: POSITIVE — AB
Neisseria Gonorrhea: NEGATIVE

## 2016-02-22 LAB — HIV ANTIBODY (ROUTINE TESTING W REFLEX): HIV Screen 4th Generation wRfx: NONREACTIVE

## 2016-02-23 LAB — CULTURE, OB URINE: Culture: >=100000 — AB

## 2016-02-24 ENCOUNTER — Telehealth (HOSPITAL_COMMUNITY): Payer: Self-pay | Admitting: *Deleted

## 2016-02-24 NOTE — Telephone Encounter (Signed)
Notified pt. of positive Chlamydia results, [facility] [address] Informed to refrain from intercourse for 2 weeks and have partner treated.

## 2016-03-26 ENCOUNTER — Inpatient Hospital Stay (HOSPITAL_COMMUNITY)
Admission: AD | Admit: 2016-03-26 | Discharge: 2016-03-26 | Disposition: A | Discharge status: Home / Self Care | Payer: Medicaid Other | Source: Ambulatory Visit | Attending: Obstetrics and Gynecology | Admitting: Obstetrics and Gynecology

## 2016-03-26 ENCOUNTER — Encounter (HOSPITAL_COMMUNITY): Payer: Self-pay

## 2016-03-26 DIAGNOSIS — F17210 Nicotine dependence, cigarettes, uncomplicated: Secondary | ICD-10-CM | POA: Diagnosis not present

## 2016-03-26 DIAGNOSIS — N760 Acute vaginitis: Secondary | ICD-10-CM

## 2016-03-26 DIAGNOSIS — O211 Hyperemesis gravidarum with metabolic disturbance: Secondary | ICD-10-CM | POA: Insufficient documentation

## 2016-03-26 DIAGNOSIS — B9689 Other specified bacterial agents as the cause of diseases classified elsewhere: Secondary | ICD-10-CM

## 2016-03-26 DIAGNOSIS — O99331 Smoking (tobacco) complicating pregnancy, first trimester: Secondary | ICD-10-CM | POA: Diagnosis not present

## 2016-03-26 DIAGNOSIS — R109 Unspecified abdominal pain: Secondary | ICD-10-CM | POA: Diagnosis present

## 2016-03-26 DIAGNOSIS — Z3A13 13 weeks gestation of pregnancy: Secondary | ICD-10-CM | POA: Diagnosis not present

## 2016-03-26 LAB — URINE MICROSCOPIC-ADD ON

## 2016-03-26 LAB — URINALYSIS, ROUTINE W REFLEX MICROSCOPIC
Bilirubin Urine: NEGATIVE
GLUCOSE, UA: NEGATIVE mg/dL
Ketones, ur: NEGATIVE mg/dL
Nitrite: NEGATIVE
PROTEIN: NEGATIVE mg/dL
SPECIFIC GRAVITY, URINE: 1.01 (ref 1.005–1.030)
pH: 6 (ref 5.0–8.0)

## 2016-03-26 LAB — RAPID URINE DRUG SCREEN, HOSP PERFORMED
AMPHETAMINES: NOT DETECTED
BARBITURATES: NOT DETECTED
Benzodiazepines: NOT DETECTED
COCAINE: POSITIVE — AB
OPIATES: NOT DETECTED
TETRAHYDROCANNABINOL: POSITIVE — AB

## 2016-03-26 LAB — WET PREP, GENITAL
Sperm: NONE SEEN
Trich, Wet Prep: NONE SEEN
Yeast Wet Prep HPF POC: NONE SEEN

## 2016-03-26 MED ORDER — CEPHALEXIN 500 MG PO CAPS: 500.0000 mg | ORAL_CAPSULE | Freq: Three times a day (TID) | ORAL | 0 refills | Status: DC

## 2016-03-26 MED ORDER — METRONIDAZOLE 500 MG PO TABS: 500.0000 mg | ORAL_TABLET | Freq: Three times a day (TID) | ORAL | 0 refills | Status: AC

## 2016-03-26 NOTE — MAU Note (Signed)
Pt presents to MAU with nausea/vomiting and pelvic pain. Reports she was at her subutex clinic, her BP was low & they suggested IV fluids. She is requesting an ultrasound due to pelvic pain. Denies bleeding or leaking of fluid. Is having a clear discharge with a slight odor.

## 2016-03-26 NOTE — MAU Provider Note (Signed)
History     CSN: [id number]  Arrival date and time: 03/26/16 1053   None     Chief Complaint  Patient presents with  . Morning Sickness  . Emesis  . Pelvic Pain   Subjective:   [name] is a 30 y.o. female who presents for evaluation of abdominal pain. Onset was several day ago. Symptoms have been unchanged. The pain is described as cramping, and is 7/10 in intensity. Pain is located in the suprapubic region without radiation.  The patient denies chills, diarrhea, dysuria, fever and myalgias.    Patient was told at her suboxone clinic on Friday, Dec 1 that she needed to come in for IV hydration because she is dehydrated. Patient is requesting an US because she wants to see her baby.  OB History    Gravida Para Term Preterm AB Living   5 2 1 1 2 2    SAB TAB Ectopic Multiple Live Births   2 0 0 0 2      Past Medical History:  Diagnosis Date  . Bartholin cyst   . Endometriosis   . Glaucoma   . Headache(784.0)   . History of PID   . Ovarian cyst   . STD (female)    hx of chlamydia and gonorrhea  . Substance abuse   . Vaginal Pap smear, abnormal    has not followed up    Past Surgical History:  Procedure Laterality Date  . DILATION AND CURETTAGE OF UTERUS    . FRACTURE SURGERY     left leg  . LAPAROSCOPY      Family History  Problem Relation Age of Onset  . Diabetes Mother   . Hypertension Maternal Grandmother   . Heart disease Maternal Grandmother     great grandma  . Anesthesia problems Neg Hx   . Hypotension Neg Hx   . Malignant hyperthermia Neg Hx   . Pseudochol deficiency Neg Hx   . Alcohol abuse Neg Hx     Social History  Substance Use Topics  . Smoking status: Current Every Day Smoker    Packs/day: 0.50    Years: 7.00    Types: Cigarettes  . Smokeless tobacco: Never Used  . Alcohol use No     Comment: Denies ETOH use    Allergies:  Allergies  Allergen Reactions  . Acyclovir And Related Swelling  . Darvocet [Propoxyphene  N-Acetaminophen] Hives  . Doxycycline Swelling  . Flexeril [Cyclobenzaprine] Nausea And Vomiting  . Metoclopramide Hives  . Naproxen Hives  . Latex Rash  . Tramadol Rash    Prescriptions Prior to Admission  Medication Sig Dispense Refill Last Dose  . buprenorphine (SUBUTEX) 8 MG SUBL SL tablet Place 8 mg under the tongue daily.   03/26/2016 at 0100  . Melatonin 10 MG TABS Take 20 mg by mouth at bedtime as needed (for sleep).   03/25/2016 at Unknown time  . VALERIAN ROOT PO Take 2 capsules by mouth at bedtime as needed (for sleep).   03/25/2016 at Unknown time  . vitamin B-12 (CYANOCOBALAMIN) 1000 MCG tablet Take 1,000 mcg by mouth daily.   03/25/2016 at Unknown time    Review of Systems  Constitutional: Negative.   HENT: Negative.   Eyes: Negative.   Respiratory: Negative.   Cardiovascular: Negative.   Gastrointestinal: Positive for nausea.  Genitourinary: Negative.   Musculoskeletal: Negative.   Skin: Negative.   Neurological: Negative.   Psychiatric/Behavioral: Positive for substance abuse.   Physical Exam  Blood pressure 115/71, pulse 80, temperature 97.9 F (36.6 C), temperature source Oral, resp. rate 16, last menstrual period 12/31/2015.  Physical Exam  Constitutional: She is oriented to person, place, and time. She appears well-developed.  Neck: Normal range of motion.  GI: Soft. She exhibits no distension and no mass. There is tenderness. There is no rebound and no guarding.  Genitourinary: Vaginal discharge found.  Genitourinary Comments: Vaginal walls are erythematous with no lesions. Cervix is pink with no lesions. Moderate mikly white discharge in the vaginal vault. No CMT, suprapubic region is tender to palpation.   Musculoskeletal: Normal range of motion.  Neurological: She is oriented to person, place, and time.  Skin: Skin is warm and dry.    MAU Course  Procedures  MDM -wet prep, GC and CT and urine culture -PO hydration\ -UDS pending Assessment and  Plan  Patient has Clue cells. Discharge home with clue cells and keflex since she says she [facility]. Patient strongly encouraged to call the clinic and start prenatal care.   Charlesetta GaribaldiKathryn Lorraine Jameer Storie 03/26/2016, 12:21 PM

## 2016-03-26 NOTE — Discharge Instructions (Signed)

## 2016-03-27 ENCOUNTER — Encounter: Payer: Self-pay | Admitting: Advanced Practice Midwife

## 2016-03-27 DIAGNOSIS — R8271 Bacteriuria: Secondary | ICD-10-CM | POA: Insufficient documentation

## 2016-03-27 LAB — CULTURE, OB URINE: Culture: 20000 — AB

## 2016-03-28 LAB — GC/CHLAMYDIA PROBE AMP (~~LOC~~) NOT AT ARMC
Chlamydia: NEGATIVE
Neisseria Gonorrhea: NEGATIVE

## 2016-04-07 ENCOUNTER — Encounter (HOSPITAL_COMMUNITY): Payer: Self-pay

## 2016-04-07 ENCOUNTER — Emergency Department (HOSPITAL_COMMUNITY)
Admission: EM | Admit: 2016-04-07 | Discharge: 2016-04-07 | Disposition: A | Discharge status: Home / Self Care | Payer: Medicaid Other | Attending: Physician Assistant | Admitting: Physician Assistant

## 2016-04-07 ENCOUNTER — Emergency Department (HOSPITAL_COMMUNITY): Payer: Medicaid Other

## 2016-04-07 DIAGNOSIS — Y999 Unspecified external cause status: Secondary | ICD-10-CM | POA: Insufficient documentation

## 2016-04-07 DIAGNOSIS — W000XXA Fall on same level due to ice and snow, initial encounter: Secondary | ICD-10-CM | POA: Diagnosis not present

## 2016-04-07 DIAGNOSIS — F1410 Cocaine abuse, uncomplicated: Secondary | ICD-10-CM | POA: Insufficient documentation

## 2016-04-07 DIAGNOSIS — M545 Low back pain: Secondary | ICD-10-CM | POA: Diagnosis not present

## 2016-04-07 DIAGNOSIS — R1030 Lower abdominal pain, unspecified: Secondary | ICD-10-CM | POA: Diagnosis not present

## 2016-04-07 DIAGNOSIS — O99322 Drug use complicating pregnancy, second trimester: Secondary | ICD-10-CM | POA: Insufficient documentation

## 2016-04-07 DIAGNOSIS — F17210 Nicotine dependence, cigarettes, uncomplicated: Secondary | ICD-10-CM | POA: Diagnosis not present

## 2016-04-07 DIAGNOSIS — Y939 Activity, unspecified: Secondary | ICD-10-CM | POA: Insufficient documentation

## 2016-04-07 DIAGNOSIS — O2342 Unspecified infection of urinary tract in pregnancy, second trimester: Secondary | ICD-10-CM | POA: Insufficient documentation

## 2016-04-07 DIAGNOSIS — Z91040 Latex allergy status: Secondary | ICD-10-CM | POA: Insufficient documentation

## 2016-04-07 DIAGNOSIS — Z3A15 15 weeks gestation of pregnancy: Secondary | ICD-10-CM | POA: Diagnosis not present

## 2016-04-07 DIAGNOSIS — R102 Pelvic and perineal pain: Secondary | ICD-10-CM | POA: Insufficient documentation

## 2016-04-07 DIAGNOSIS — O9A212 Injury, poisoning and certain other consequences of external causes complicating pregnancy, second trimester: Secondary | ICD-10-CM | POA: Diagnosis present

## 2016-04-07 DIAGNOSIS — N390 Urinary tract infection, site not specified: Secondary | ICD-10-CM

## 2016-04-07 DIAGNOSIS — Z79899 Other long term (current) drug therapy: Secondary | ICD-10-CM | POA: Diagnosis not present

## 2016-04-07 DIAGNOSIS — O009 Unspecified ectopic pregnancy without intrauterine pregnancy: Secondary | ICD-10-CM

## 2016-04-07 DIAGNOSIS — O99332 Smoking (tobacco) complicating pregnancy, second trimester: Secondary | ICD-10-CM | POA: Diagnosis not present

## 2016-04-07 DIAGNOSIS — Y929 Unspecified place or not applicable: Secondary | ICD-10-CM | POA: Insufficient documentation

## 2016-04-07 LAB — COMPREHENSIVE METABOLIC PANEL
ALBUMIN: 3.5 g/dL (ref 3.5–5.0)
ALT: 14 U/L (ref 14–54)
AST: 18 U/L (ref 15–41)
Alkaline Phosphatase: 53 U/L (ref 38–126)
Anion gap: 6 (ref 5–15)
BUN: 6 mg/dL (ref 6–20)
CHLORIDE: 105 mmol/L (ref 101–111)
CO2: 25 mmol/L (ref 22–32)
CREATININE: 0.58 mg/dL (ref 0.44–1.00)
Calcium: 8.7 mg/dL — ABNORMAL LOW (ref 8.9–10.3)
GFR calc Af Amer: >60 mL/min (ref >60)
GLUCOSE: 84 mg/dL (ref 65–99)
POTASSIUM: 3.5 mmol/L (ref 3.5–5.1)
Sodium: 136 mmol/L (ref 135–145)
Total Bilirubin: 0.5 mg/dL (ref 0.3–1.2)
Total Protein: 7 g/dL (ref 6.5–8.1)

## 2016-04-07 LAB — CBC WITH DIFFERENTIAL/PLATELET
BASOS ABS: 0 10*3/uL (ref 0.0–0.1)
BASOS PCT: 0 %
EOS PCT: 5 %
Eosinophils Absolute: 0.4 10*3/uL (ref 0.0–0.7)
HCT: 32.8 % — ABNORMAL LOW (ref 36.0–46.0)
Hemoglobin: 11.7 g/dL — ABNORMAL LOW (ref 12.0–15.0)
LYMPHS PCT: 23 %
Lymphs Abs: 2 10*3/uL (ref 0.7–4.0)
MCH: 30.2 pg (ref 26.0–34.0)
MCHC: 35.7 g/dL (ref 30.0–36.0)
MCV: 84.8 fL (ref 78.0–100.0)
Monocytes Absolute: 0.5 10*3/uL (ref 0.1–1.0)
Monocytes Relative: 6 %
NEUTROS ABS: 5.9 10*3/uL (ref 1.7–7.7)
Neutrophils Relative %: 66 %
PLATELETS: 430 10*3/uL — AB (ref 150–400)
RBC: 3.87 MIL/uL (ref 3.87–5.11)
RDW: 13.3 % (ref 11.5–15.5)
WBC: 8.8 10*3/uL (ref 4.0–10.5)

## 2016-04-07 LAB — HCG, QUANTITATIVE, PREGNANCY: HCG, BETA CHAIN, QUANT, S: 24396 m[IU]/mL — AB (ref <5)

## 2016-04-07 LAB — URINALYSIS, ROUTINE W REFLEX MICROSCOPIC
Bilirubin Urine: NEGATIVE
Glucose, UA: NEGATIVE mg/dL
Ketones, ur: NEGATIVE mg/dL
NITRITE: POSITIVE — AB
PROTEIN: NEGATIVE mg/dL
SPECIFIC GRAVITY, URINE: 1.01 (ref 1.005–1.030)
pH: 6 (ref 5.0–8.0)

## 2016-04-07 LAB — RAPID URINE DRUG SCREEN, HOSP PERFORMED
AMPHETAMINES: NOT DETECTED
BENZODIAZEPINES: POSITIVE — AB
Barbiturates: NOT DETECTED
Cocaine: POSITIVE — AB
OPIATES: NOT DETECTED
TETRAHYDROCANNABINOL: POSITIVE — AB

## 2016-04-07 LAB — ETHANOL

## 2016-04-07 MED ORDER — NITROFURANTOIN MONOHYD MACRO 100 MG PO CAPS
100.0000 mg | ORAL_CAPSULE | Freq: Once | ORAL | Status: AC
  Administered 2016-04-07: 100 mg via ORAL
  Filled 2016-04-07: qty 1

## 2016-04-07 MED ORDER — PROMETHAZINE HCL 12.5 MG PO TABS: 12.5000 mg | ORAL_TABLET | Freq: Four times a day (QID) | ORAL | 0 refills | Status: DC | PRN

## 2016-04-07 MED ORDER — NITROFURANTOIN MONOHYD MACRO 100 MG PO CAPS: 100.0000 mg | ORAL_CAPSULE | Freq: Two times a day (BID) | ORAL | 0 refills | Status: DC

## 2016-04-07 MED ORDER — SODIUM CHLORIDE 0.9 % IV BOLUS (SEPSIS)
1000.0000 mL | Freq: Once | INTRAVENOUS | Status: AC
  Administered 2016-04-07: 1000 mL via INTRAVENOUS

## 2016-04-07 NOTE — Discharge Instructions (Signed)
You need to fill the prescriptions for antibiotics. Please follow-up with your OB/GYN as soon as possible.

## 2016-04-07 NOTE — ED Notes (Signed)
ED Provider at bedside. 

## 2016-04-07 NOTE — ED Notes (Signed)
Went to d/c pt and she was dressed and ready to go, states that she does not have any additional questions and would like to go as she is in a hurry to pick up her children, no d/c instructions or signature due to this as pt insists on leaving right away.  No distress noted

## 2016-04-07 NOTE — ED Notes (Signed)
Pt's story keeps changing regarding her due date.  Triage nurse reports inappropriate behavior between Pt and her female visitor.

## 2016-04-07 NOTE — ED Notes (Signed)
Pt reminded of need for urine sample.  

## 2016-04-07 NOTE — ED Triage Notes (Addendum)
Patient states she fell on black ice yesterday and now c/o lower abdominal pain, and back pain. Patient states she is pregnant. Patient states she was 7 1/2 weeks on October 29/2017. Patient states she has not felt her baby move since falling yesterday.

## 2016-04-07 NOTE — ED Provider Notes (Signed)
[facility] DEPT Provider Note   CSN: [id number] Arrival date & time: 04/07/16  1303     History   Chief Complaint Chief Complaint  Patient presents with  . Fall  . Back Pain  . Abdominal Pain    HPI [name] is a 30 y.o. female.  HPI   Patient is a 30 year old female with past medical history significant for substance abuse in pregnancy. Patient reports that she's pregnant. We are unsure how far along. She says that she thinks she heard the heartbeat sometime in October at an appointment at [facility] .as not been taking her prenatal vitamin. She's continued to do crack cocaine and other drugs.    patient reports that she fell yesterday on black ice. Patient appears intoxicated at this time.  Past Medical History:  Diagnosis Date  . Bartholin cyst   . Endometriosis   . Glaucoma   . Headache(784.0)   . History of PID   . Ovarian cyst   . STD (female)    hx of chlamydia and gonorrhea  . Substance abuse   . Vaginal Pap smear, abnormal    has not followed up    Patient Active Problem List   Diagnosis Date Noted  . Group B streptococcal bacteriuria 03/27/2016  . MDD (major depressive disorder), recurrent, severe, with psychosis (HCC) 03/28/2015  . Cocaine abuse with cocaine-induced mood disorder (HCC) 03/27/2015  . Normal labor 12/10/2013  . Opiate dependence, continuous (HCC) 08/07/2013  . Cocaine abuse 08/07/2013  . Nausea/vomiting in pregnancy 07/08/2013  . Pica 07/08/2013  . Polysubstance dependence including opioid type drug, continuous use (HCC) 06/17/2013  . PTSD (post-traumatic stress disorder) 06/17/2013  . Unspecified vitamin D deficiency 06/04/2013  . Sickle cell trait (HCC) 06/04/2013  . Trichomonal vaginitis in pregnancy in second trimester 06/02/2013  . Polysubstance abuse 05/31/2013  . Tobacco use complicating pregnancy 05/30/2013  . Unspecified high-risk pregnancy 05/30/2013  . Marijuana use 05/27/2013  . Opioid dependence (HCC)  09/09/2011  . Pelvic pain in female 12/07/2010  . DUB (dysfunctional uterine bleeding) 12/07/2010  . History of PID 12/07/2010    Past Surgical History:  Procedure Laterality Date  . DILATION AND CURETTAGE OF UTERUS    . FRACTURE SURGERY     left leg  . LAPAROSCOPY      OB History    Gravida Para Term Preterm AB Living   5 2 1 1 2 2    SAB TAB Ectopic Multiple Live Births   2 0 0 0 2       Home Medications    Prior to Admission medications   Medication Sig Start Date End Date Taking? Authorizing Provider  buprenorphine (SUBUTEX) 8 MG SUBL SL tablet Place 8 mg under the tongue 2 (two) times daily.    Yes Historical Provider, MD  folic acid (FOLVITE) 1 MG tablet Take 1 mg by mouth daily.   Yes Historical Provider, MD  Melatonin 10 MG TABS Take 20 mg by mouth at bedtime as needed (for sleep).   Yes Historical Provider, MD  VALERIAN ROOT PO Take 2 capsules by mouth at bedtime as needed (for sleep).   Yes Historical Provider, MD  vitamin B-12 (CYANOCOBALAMIN) 1000 MCG tablet Take 1,000 mcg by mouth daily.   Yes Historical Provider, MD    Family History Family History  Problem Relation Age of Onset  . Diabetes Mother   . Hypertension Maternal Grandmother   . Heart disease Maternal Grandmother     great grandma  .  Anesthesia problems Neg Hx   . Hypotension Neg Hx   . Malignant hyperthermia Neg Hx   . Pseudochol deficiency Neg Hx   . Alcohol abuse Neg Hx     Social History Social History  Substance Use Topics  . Smoking status: Current Every Day Smoker    Packs/day: 0.50    Years: 7.00    Types: Cigarettes  . Smokeless tobacco: Never Used  . Alcohol use No     Comment: Denies ETOH use     Allergies   Acyclovir and related; Darvocet [propoxyphene n-acetaminophen]; Doxycycline; Flexeril [cyclobenzaprine]; Metoclopramide; Naproxen; Latex; and Tramadol   Review of Systems Review of Systems  Constitutional: Negative for fatigue and fever.  Respiratory: Negative  for shortness of breath.   Genitourinary: Positive for pelvic pain.  Musculoskeletal: Positive for back pain.  All other systems reviewed and are negative.    Physical Exam Updated Vital Signs BP 117/70 (BP Location: Left Arm)   Pulse 80   Temp 97.7 F (36.5 C) (Oral)   Resp 18   Ht 5\' 4"  (1.626 m)   Wt 136 lb 12.8 oz (62.1 kg)   LMP 12/31/2015 (Approximate)   SpO2 98%   BMI 23.48 kg/m   Physical Exam  Constitutional: She is oriented to person, place, and time. She appears well-developed and well-nourished.  HENT:  Head: Normocephalic and atraumatic.  Eyes: Right eye exhibits no discharge.  Cardiovascular: Normal rate, regular rhythm and normal heart sounds.   No murmur heard. Pulmonary/Chest: Effort normal and breath sounds normal. She has no wheezes. She has no rales.  Abdominal: Soft. She exhibits no distension. There is tenderness.  Tenderness and bilateral adnexal areas, lower abdomen.  Musculoskeletal:  Mild tenderness and bilateral paraspinal lumbar spine. No evidence of trauma.  Neurological: She is oriented to person, place, and time.  Skin: Skin is warm and dry. She is not diaphoretic.  No evidence of trauma.  Psychiatric: She has a normal mood and affect.  Patient's behavior consistent with drug use.  Nursing note and vitals reviewed.    ED Treatments / Results  Labs (all labs ordered are listed, but only abnormal results are displayed) Labs Reviewed  CBC WITH DIFFERENTIAL/PLATELET - Abnormal; Notable for the following:       Result Value   Hemoglobin 11.7 (*)    HCT 32.8 (*)    Platelets 430 (*)    All other components within normal limits  COMPREHENSIVE METABOLIC PANEL - Abnormal; Notable for the following:    Calcium 8.7 (*)    All other components within normal limits  RAPID URINE DRUG SCREEN, HOSP PERFORMED - Abnormal; Notable for the following:    Cocaine POSITIVE (*)    Benzodiazepines POSITIVE (*)    Tetrahydrocannabinol POSITIVE (*)     All other components within normal limits  URINALYSIS, ROUTINE W REFLEX MICROSCOPIC - Abnormal; Notable for the following:    APPearance CLOUDY (*)    Hgb urine dipstick MODERATE (*)    Nitrite POSITIVE (*)    Leukocytes, UA LARGE (*)    Bacteria, UA RARE (*)    Squamous Epithelial / LPF 6-30 (*)    All other components within normal limits  HCG, QUANTITATIVE, PREGNANCY - Abnormal; Notable for the following:    hCG, Beta Chain, Quant, S 24,396 (*)    All other components within normal limits  ETHANOL  HIV ANTIBODY (ROUTINE TESTING)  ABO/RH    EKG  EKG Interpretation None  Radiology [place] Ob Limited  Result Date: 04/07/2016 CLINICAL DATA:  Slipped on ice and fell yesterday. Abdominal pain and back pain. Early pregnancy. EXAM: LIMITED OBSTETRIC ULTRASOUND COMPARISON:  02/21/2016 FINDINGS: Number of Fetuses: 1 Heart Rate:  145 bpm Movement: Present Presentation: Variable Placental Location: Fontanel Previa: None Amniotic Fluid (Subjective):  Within normal limits. BPD:  2.73cm 14w  6d MATERNAL FINDINGS: Cervix:  Appears closed. IMPRESSION: Normal appearing living single intrauterine pregnancy at 14 weeks 6 days by BPD. No traumatic finding. This exam is performed on an emergent basis and does not comprehensively evaluate fetal size, dating, or anatomy; follow-up complete OB US should be considered if further fetal assessment is warranted. Electronically Signed   By: Paulina FusiMark  Shogry M.D.   On: 04/07/2016 14:56    Procedures Procedures (including critical care time)  Medications Ordered in ED Medications  sodium chloride 0.9 % bolus 1,000 mL (0 mLs Intravenous Stopped 04/07/16 1645)  nitrofurantoin (macrocrystal-monohydrate) (MACROBID) capsule 100 mg (100 mg Oral Given 04/07/16 1649)     Initial Impression / Assessment and Plan / ED Course  I have reviewed the triage vital signs and the nursing notes.  Pertinent labs & imaging results that were available during my care of the  patient were reviewed by me and considered in my medical decision making (see chart for details).  Clinical Course     Patient is 30 year old female history of substance abuse and pregnancy. Patient is unsure how far along she has. She's not receive much prenatal care. She continued to do crack cocaine. Patient reports that she fell yesterday and has abdominal pain and back pain. We will do labs, transvaginal ultrasound given her pregnancy and abdominal pain.   5:18 PM Patient ate 2 sandwiches. Her ultrasound shows 14 week pregnancy. We encouraged patient to follow up with OB/GYN. Patient requesting something for nausea. We'll give her Reglan. Patient also found to have UTI we'll give her Macrobid.   Final Clinical Impressions(s) / ED Diagnoses   Final diagnoses:  Ectopic pregnancy    New Prescriptions New Prescriptions   No medications on file     Davontae Prusinski Randall AnLyn Izela Altier, MD 04/07/16 1718

## 2016-04-08 LAB — HIV ANTIBODY (ROUTINE TESTING W REFLEX): HIV Screen 4th Generation wRfx: NONREACTIVE

## 2016-04-08 LAB — ABO/RH: ABO/RH(D): B POS

## 2016-04-13 ENCOUNTER — Inpatient Hospital Stay (EMERGENCY_DEPARTMENT_HOSPITAL)
Admission: AD | Admit: 2016-04-13 | Discharge: 2016-04-13 | Disposition: A | Discharge status: Home / Self Care | Payer: Medicaid Other | Source: Ambulatory Visit | Attending: Family Medicine | Admitting: Family Medicine

## 2016-04-13 ENCOUNTER — Emergency Department (HOSPITAL_COMMUNITY)
Admission: EM | Admit: 2016-04-13 | Discharge: 2016-04-13 | Disposition: A | Discharge status: Home / Self Care | Payer: Medicaid Other | Attending: Emergency Medicine | Admitting: Emergency Medicine

## 2016-04-13 ENCOUNTER — Emergency Department (HOSPITAL_COMMUNITY)
Admission: EM | Admit: 2016-04-13 | Discharge: 2016-04-13 | Discharge status: Absent without leave | Payer: Medicaid Other

## 2016-04-13 ENCOUNTER — Encounter (HOSPITAL_COMMUNITY): Payer: Self-pay | Admitting: Emergency Medicine

## 2016-04-13 ENCOUNTER — Encounter (HOSPITAL_COMMUNITY): Payer: Self-pay

## 2016-04-13 DIAGNOSIS — R8271 Bacteriuria: Secondary | ICD-10-CM | POA: Diagnosis not present

## 2016-04-13 DIAGNOSIS — O99331 Smoking (tobacco) complicating pregnancy, first trimester: Secondary | ICD-10-CM | POA: Insufficient documentation

## 2016-04-13 DIAGNOSIS — F329 Major depressive disorder, single episode, unspecified: Secondary | ICD-10-CM | POA: Insufficient documentation

## 2016-04-13 DIAGNOSIS — F1490 Cocaine use, unspecified, uncomplicated: Secondary | ICD-10-CM

## 2016-04-13 DIAGNOSIS — O99322 Drug use complicating pregnancy, second trimester: Secondary | ICD-10-CM | POA: Insufficient documentation

## 2016-04-13 DIAGNOSIS — F1910 Other psychoactive substance abuse, uncomplicated: Secondary | ICD-10-CM | POA: Diagnosis not present

## 2016-04-13 DIAGNOSIS — Z3A14 14 weeks gestation of pregnancy: Secondary | ICD-10-CM

## 2016-04-13 DIAGNOSIS — F17210 Nicotine dependence, cigarettes, uncomplicated: Secondary | ICD-10-CM

## 2016-04-13 DIAGNOSIS — O99342 Other mental disorders complicating pregnancy, second trimester: Secondary | ICD-10-CM | POA: Insufficient documentation

## 2016-04-13 DIAGNOSIS — R45851 Suicidal ideations: Secondary | ICD-10-CM | POA: Diagnosis not present

## 2016-04-13 DIAGNOSIS — F1120 Opioid dependence, uncomplicated: Secondary | ICD-10-CM

## 2016-04-13 DIAGNOSIS — O99332 Smoking (tobacco) complicating pregnancy, second trimester: Secondary | ICD-10-CM

## 2016-04-13 DIAGNOSIS — Z91040 Latex allergy status: Secondary | ICD-10-CM | POA: Insufficient documentation

## 2016-04-13 DIAGNOSIS — O99341 Other mental disorders complicating pregnancy, first trimester: Secondary | ICD-10-CM | POA: Diagnosis present

## 2016-04-13 DIAGNOSIS — O99820 Streptococcus B carrier state complicating pregnancy: Secondary | ICD-10-CM | POA: Insufficient documentation

## 2016-04-13 DIAGNOSIS — O99321 Drug use complicating pregnancy, first trimester: Secondary | ICD-10-CM | POA: Insufficient documentation

## 2016-04-13 MED ORDER — PROMETHAZINE HCL 25 MG PO TABS
25.0000 mg | ORAL_TABLET | Freq: Once | ORAL | Status: AC
  Administered 2016-04-13: 25 mg via ORAL
  Filled 2016-04-13: qty 1

## 2016-04-13 MED ORDER — ONDANSETRON 8 MG PO TBDP: 8.0000 mg | ORAL_TABLET | Freq: Once | ORAL | Status: DC

## 2016-04-13 NOTE — Discharge Instructions (Signed)
Finding Treatment for Addiction Introduction WHAT IS ADDICTION? Addiction is a complex disease of the brain. It causes an uncontrollable (compulsive) need for a substance. You can be addicted to alcohol, illegal drugs, or prescription medicines such as painkillers. Addiction can also be a behavior, like gambling or shopping. The need for the drug or activity can become so strong that you think about it all the time. You can also become physically dependent on a substance. Addiction can change the way your brain works. Because of these changes, getting more of whatever you are addicted to becomes the most important thing to you and feels better than other activities or relationships. Addiction can lead to changes in health, behavior, emotions, relationships, and choices that affect you and everyone around you. HOW DO I KNOW IF I NEED TREATMENT FOR ADDICTION? Addiction is a progressive disease. Without treatment, addiction can get worse. Living with addiction puts you at higher risk for injury, poor health, lost employment, loss of money, and even death. You might need treatment for addiction if:  You have tried to stop or cut down, but you cannot.  Your addiction is causing physical health problems.  You find it annoying that your friends and family are concerned about your alcohol or substance use.  You feel guilty about substance abuse or a compulsive behavior.  You have lied or tried to hide your addiction.  You need a particular substance or activity to start your day or to calm down.  You are getting in trouble at school, work, home, or with the police.  You have done something illegal to support your addiction.  You are running out of money because of your addiction.  You have no time for anything other than your addiction. WHAT TYPES OF TREATMENT ARE AVAILABLE? The treatment program that is right for you will depend on many factors, including the type of addiction you have.  Treatment programs can be outpatient or inpatient. In an outpatient program, you live at home and go to work or school, but you also go to a clinic for treatment. With an inpatient program, you live and sleep at the program facility during treatment. After treatment, you might need a plan for support during recovery. Other treatment options include:  Medicine.  Some addictions may be treated with prescription medicines.  You might also need medicine to treat anxiety or depression.  Counseling and behavior therapy. Therapy can help individuals and families behave in healthier ways and relate more effectively.  Support groups. Confidential group therapy, such as a 12-step program, can help individuals and families during treatment and recovery. No single type of program is right for everyone. Many treatment programs involve a combination of education, counseling, and a 12-step, spiritually-based approach. Some treatment programs are government sponsored. They are geared for patients who do not have private insurance. Treatment programs can vary in many respects, such as:  Cost and types of insurance that are accepted.  Types of on-site medical services that are offered.  Length of stay, setting, and size.  Overall philosophy of treatment. WHAT SHOULD I CONSIDER WHEN SELECTING A TREATMENT PROGRAM? It is important to think about your individual requirements when selecting a treatment program. There are a number of things to consider, such as:  If the program is certified by the appropriate government agency. Even private programs must be certified and employ certified professionals.  If the program is covered by your insurance. If finances are a concern, the first call you should make  is to [employer] insurance company, if you have health insurance. Ask for a list of treatment programs that are in your network, and confirm any copayments and deductibles that you may have to pay.  If you do not have  insurance, or if you choose to attend a program that does not accept your insurance, discuss whether a payment plan can be set up.  If treatment is available in languages other than English, if needed.  If the program offers detoxification treatment, if needed.  If 12-step meetings are held at the center or if transport is available for patients to attend meetings at other locations.  If the program is professional, organized, and clean.  If the program meets all of your needs, including physical and cultural needs.  If the facility offers specific treatment for your particular addiction.  If support continues to be offered after you have left the program.  If your treatment plan is continually looked at to make sure you are receiving the right treatment at the right time.  If mental health counseling is part of your treatment.  If medicine is included in treatment, if needed.  If your family is included in your treatment plan and if support is offered to them throughout the treatment process.  How the treatment works to prevent relapse. WHERE ELSE CAN I GET HELP?  Your health care provider. Ask him or her to help you find addiction treatment. These discussions are confidential.  The [employer] on Alcoholism and Drug Dependence (NCADD). This group has information about treatment centers and programs for people who have an addiction and for family members.  The telephone number is 1-800-NCA-CALL (706-214-74171-(646) 887-9636).  The website is https://ncadd.org/about-ncadd/our-affiliates  The Substance Abuse and Mental Health Services Administration [facility](SAMHSA). This group will help you find publicly funded treatment centers, help hotlines, and counseling services near you.  The telephone number is 1-800-662-HELP ((385)248-71931-(847)133-5910).  The website is www.findtreatment.RockToxic.plsamhsa.gov In countries outside of the [place], look in [employer]local directories for contact information for services in your  area. This information is not intended to replace advice given to you by your health care provider. Make sure you discuss any questions you have with your health care provider. Document Released: 03/10/2005 Document Revised: 09/17/2015 Document Reviewed: 01/28/2014  2017 Elsevier  PLACES TO FIND HELP (OUTPATIENT RESOURCES):  [facility] [place] [address] [place], [place]- [phone]   [facility] [address] [place], [place]- [id number] [phone]   [facility] [address] [place], [place]- [id number] [phone]   [facility] [address] [place], [place]- [address] [phone]   [facility] [address] [place], [place]- [id number] [phone]

## 2016-04-13 NOTE — ED Provider Notes (Signed)
[facility] Provider Note   CSN: [id number] Arrival date & time: 04/13/16  [id number]     History   Chief Complaint Chief Complaint  Patient presents with  . Suicidal  . [redacted] weeks pregnant    HPI [name] is a 30 y.o. female.  30 year old female who is [redacted] weeks pregnant and has a history of substance abuse who recently stopped using her Subutex presents with body aches abdominal cramping consistent with opiate withdrawal. [facility] here and I spoke to the provider. She was there with similar complaints and had fetal heart tones of 145. She had an ultrasound which showed good viability a few days ago. Patient has some fleeting suicidal ideations without a definitive plan. Upon further questioning she says she is not suicidal and does not have any prior history of attempting suicide. She is not responding to internal stimuli. States that she wants help with her substance abuse problem.      Past Medical History:  Diagnosis Date  . Bartholin cyst   . Endometriosis   . Glaucoma   . Headache(784.0)   . History of PID   . Ovarian cyst   . STD (female)    hx of chlamydia and gonorrhea  . Substance abuse   . Vaginal Pap smear, abnormal    has not followed up    Patient Active Problem List   Diagnosis Date Noted  . Group B streptococcal bacteriuria 03/27/2016  . MDD (major depressive disorder), recurrent, severe, with psychosis (HCC) 03/28/2015  . Cocaine abuse with cocaine-induced mood disorder (HCC) 03/27/2015  . Normal labor 12/10/2013  . Opiate dependence, continuous (HCC) 08/07/2013  . Cocaine abuse 08/07/2013  . Nausea/vomiting in pregnancy 07/08/2013  . Pica 07/08/2013  . Polysubstance dependence including opioid type drug, continuous use (HCC) 06/17/2013  . PTSD (post-traumatic stress disorder) 06/17/2013  . Unspecified vitamin D deficiency 06/04/2013  . Sickle cell trait (HCC) 06/04/2013  . Trichomonal vaginitis  in pregnancy in second trimester 06/02/2013  . Polysubstance abuse 05/31/2013  . Tobacco use complicating pregnancy 05/30/2013  . Unspecified high-risk pregnancy 05/30/2013  . Marijuana use 05/27/2013  . Opioid dependence (HCC) 09/09/2011  . Pelvic pain in female 12/07/2010  . DUB (dysfunctional uterine bleeding) 12/07/2010  . History of PID 12/07/2010    Past Surgical History:  Procedure Laterality Date  . DILATION AND CURETTAGE OF UTERUS    . FRACTURE SURGERY     left leg  . LAPAROSCOPY      OB History    Gravida Para Term Preterm AB Living   5 2 1 1 2 2    SAB TAB Ectopic Multiple Live Births   2 0 0 0 2       Home Medications    Prior to Admission medications   Medication Sig Start Date End Date Taking? Authorizing Provider  buprenorphine (SUBUTEX) 8 MG SUBL SL tablet Place 8 mg under the tongue 2 (two) times daily.    Yes Historical Provider, MD  folic acid (FOLVITE) 1 MG tablet Take 1 mg by mouth daily.   Yes Historical Provider, MD  Melatonin 10 MG TABS Take 20 mg by mouth at bedtime as needed (for sleep).   Yes Historical Provider, MD  promethazine (PHENERGAN) 12.5 MG tablet Take 1 tablet (12.5 mg total) by mouth every 6 (six) hours as needed for nausea or vomiting. 04/07/16  Yes Courteney Lyn Mackuen, MD  VALERIAN ROOT PO Take 2 capsules by  mouth at bedtime as needed (for sleep).   Yes Historical Provider, MD  vitamin B-12 (CYANOCOBALAMIN) 1000 MCG tablet Take 1,000 mcg by mouth daily.   Yes Historical Provider, MD  nitrofurantoin, macrocrystal-monohydrate, (MACROBID) 100 MG capsule Take 1 capsule (100 mg total) by mouth 2 (two) times daily. Patient not taking: Reported on 04/13/2016 04/07/16   Courteney Lyn Mackuen, MD    Family History Family History  Problem Relation Age of Onset  . Diabetes Mother   . Hypertension Maternal Grandmother   . Heart disease Maternal Grandmother     great grandma  . Anesthesia problems Neg Hx   . Hypotension Neg Hx   .  Malignant hyperthermia Neg Hx   . Pseudochol deficiency Neg Hx   . Alcohol abuse Neg Hx     Social History Social History  Substance Use Topics  . Smoking status: Current Every Day Smoker    Packs/day: 0.50    Years: 7.00    Types: Cigarettes  . Smokeless tobacco: Never Used  . Alcohol use No     Comment: Denies ETOH use     Allergies   Acyclovir and related; Darvocet [propoxyphene n-acetaminophen]; Doxycycline; Flexeril [cyclobenzaprine]; Metoclopramide; Naproxen; Latex; and Tramadol   Review of Systems Review of Systems  All other systems reviewed and are negative.    Physical Exam Updated Vital Signs BP 121/70   Pulse 92   Resp 16   Ht 5\' 4"  (1.626 m)   Wt 63 kg   LMP 12/31/2015 (Approximate)   SpO2 100%   BMI 23.86 kg/m   Physical Exam  Constitutional: She is oriented to person, place, and time. She appears well-developed and well-nourished.  Non-toxic appearance. No distress.  HENT:  Head: Normocephalic and atraumatic.  Eyes: Conjunctivae, EOM and lids are normal. Pupils are equal, round, and reactive to light.  Neck: Normal range of motion. Neck supple. No tracheal deviation present. No thyroid mass present.  Cardiovascular: Normal rate, regular rhythm and normal heart sounds.  Exam reveals no gallop.   No murmur heard. Pulmonary/Chest: Effort normal and breath sounds normal. No stridor. No respiratory distress. She has no decreased breath sounds. She has no wheezes. She has no rhonchi. She has no rales.  Abdominal: Soft. Normal appearance and bowel sounds are normal. She exhibits no distension. There is no tenderness. There is no rebound and no CVA tenderness.  Musculoskeletal: Normal range of motion. She exhibits no edema or tenderness.  Neurological: She is alert and oriented to person, place, and time. She has normal strength. No cranial nerve deficit or sensory deficit. GCS eye subscore is 4. GCS verbal subscore is 5. GCS motor subscore is 6.  Skin: Skin  is warm and dry. No abrasion and no rash noted.  Psychiatric: She has a normal mood and affect. Her speech is normal and behavior is normal. She expresses no homicidal and no suicidal ideation. She expresses no suicidal plans and no homicidal plans.  Nursing note and vitals reviewed.    ED Treatments / Results  Labs (all labs ordered are listed, but only abnormal results are displayed) Labs Reviewed - No data to display  EKG  EKG Interpretation None       Radiology No results found.  Procedures Procedures (including critical care time)  Medications Ordered in ED Medications  ondansetron (ZOFRAN-ODT) disintegrating tablet 8 mg (not administered)     Initial Impression / Assessment and Plan / ED Course  I have reviewed the triage vital signs and the  nursing notes.  Pertinent labs & imaging results that were available during my care of the patient were reviewed by me and considered in my medical decision making (see chart for details).  Clinical Course     Patient given Phenergan here for nausea. Patient does not have any suicidal ideations. She has no suicidal plan. This is confirmed in front of a witness. Will be given referrals for outpatient clinics for substance abuse treatment  Final Clinical Impressions(s) / ED Diagnoses   Final diagnoses:  None    New Prescriptions New Prescriptions   No medications on file     Lorre Nick, MD 04/13/16 1919

## 2016-04-13 NOTE — MAU Note (Signed)
Unable to void at this time.

## 2016-04-13 NOTE — ED Triage Notes (Signed)
Patient reports she is suffering from depression and states she feels like she is going to hurt herself. Patient is [redacted] weeks pregnant and is on subutex. Patient states she ran out of subutex 2 days ago and feels she is going through withdrawals.

## 2016-04-13 NOTE — ED Notes (Signed)
Name called to lobby with no response. 

## 2016-04-13 NOTE — ED Notes (Signed)
Pt changed in to purple scrubs. [facility]

## 2016-04-13 NOTE — ED Notes (Signed)
Pt verbalized with MD and this RN present that she is not suicidal and has no intentions of hurting herself or the baby.

## 2016-04-13 NOTE — MAU Provider Note (Signed)
MAU PROVIDER NOTE  Chief Complaint:  Seeking help for opioid dependence.    HPI: [name] is a 30 y.o. [id number] at 3018w6d who presents to maternity admissions reporting withdrawal symptoms.    Withdrawals began about 2 days ago.  Achy muscles, chills, sweats, irritability, headaches.  History of oxycodone and hydrocodone.  Originally prescribed for endometriosis but then patient began abusing it over about 7 years time.  Decreased the amount taken during pregnancy and started Subutex.  Takes 8mg  2x a day.  Would like some subutex here and something to eat.   Had tried rehab with last pregnancy.  Has been trying to get into [facility] and would like to go there today.   Denies abdominal pain.   Denies contractions, leakage of fluid or vaginal bleeding. Good fetal movement.    States she smokes 5 cigs a day.  Denies alcohol use.   Reports cocaine use - last used yesterday and has used several times during pregnancy.  Also with history of depression. Not on medication.   Has a sense of hopelessness, despair.  Wants to seek help.  Currently no SI/HI.    Pregnancy Course: Depression, not on medications.  Opiate dependence, cocaine use during pregnancy, current smoker, GBS+   Past Medical History: Past Medical History:  Diagnosis Date  . Bartholin cyst   . Endometriosis   . Glaucoma   . Headache(784.0)   . History of PID   . Ovarian cyst   . STD (female)    hx of chlamydia and gonorrhea  . Substance abuse   . Vaginal Pap smear, abnormal    has not followed up   Past obstetric history: OB History  Gravida Para Term Preterm AB Living  5 2 1 1 2 2   SAB TAB Ectopic Multiple Live Births  2 0 0 0 2    # Outcome Date GA Lbr Len/2nd Weight Sex Delivery Anes PTL Lv  5 Current           4 Term 12/10/13 3013w6d 08:36 / 00:08 7 lb 3 oz (3.26 kg) F Vag-Spont EPI  LIV  3 Preterm 03/29/03 3036w0d  6 lb 7 oz (2.92 kg) F Vag-Spont EPI N LIV  2 SAB         DEC  1 SAB         DEC      Past Surgical History: Past Surgical History:  Procedure Laterality Date  . DILATION AND CURETTAGE OF UTERUS    . FRACTURE SURGERY     left leg  . LAPAROSCOPY     Family History: Family History  Problem Relation Age of Onset  . Diabetes Mother   . Hypertension Maternal Grandmother   . Heart disease Maternal Grandmother     great grandma  . Anesthesia problems Neg Hx   . Hypotension Neg Hx   . Malignant hyperthermia Neg Hx   . Pseudochol deficiency Neg Hx   . Alcohol abuse Neg Hx    Social History: Social History  Substance Use Topics  . Smoking status: Current Every Day Smoker    Packs/day: 0.50    Years: 7.00    Types: Cigarettes  . Smokeless tobacco: Never Used  . Alcohol use No     Comment: Denies ETOH use   Allergies:  Allergies  Allergen Reactions  . Acyclovir And Related Swelling and Other (See Comments)    Reaction:  Facial/tongue swelling  . Darvocet [Propoxyphene N-Acetaminophen] Hives  . Doxycycline  Swelling and Other (See Comments)    Reaction:  Facial/tongue swelling  . Flexeril [Cyclobenzaprine] Nausea And Vomiting  . Metoclopramide Hives  . Naproxen Hives  . Latex Rash  . Tramadol Rash   Meds:  Prescriptions Prior to Admission  Medication Sig Dispense Refill Last Dose  . buprenorphine (SUBUTEX) 8 MG SUBL SL tablet Place 8 mg under the tongue 2 (two) times daily.    04/11/2016 at Unknown time  . folic acid (FOLVITE) 1 MG tablet Take 1 mg by mouth daily.   04/12/2016 at Unknown time  . Melatonin 10 MG TABS Take 20 mg by mouth at bedtime as needed (for sleep).   04/12/2016 at Unknown time  . promethazine (PHENERGAN) 12.5 MG tablet Take 1 tablet (12.5 mg total) by mouth every 6 (six) hours as needed for nausea or vomiting. 30 tablet 0 Past Week at Unknown time  . VALERIAN ROOT PO Take 2 capsules by mouth at bedtime as needed (for sleep).   Past Week at Unknown time  . vitamin B-12 (CYANOCOBALAMIN) 1000 MCG tablet Take 1,000 mcg by mouth daily.    Past Week at Unknown time  . nitrofurantoin, macrocrystal-monohydrate, (MACROBID) 100 MG capsule Take 1 capsule (100 mg total) by mouth 2 (two) times daily. (Patient not taking: Reported on 04/13/2016) 10 capsule 0 Not Taking at Unknown time   I have reviewed patient's Past Medical Hx, Surgical Hx, Family Hx, Social Hx, medications and allergies.   ROS:  A comprehensive ROS was negative except per HPI.   Physical Exam   Patient Vitals for the past 24 hrs:  BP Temp Temp src Pulse Resp Height Weight  04/13/16 1606 109/65 98.7 F (37.1 C) Oral 97 16 5\' 4"  (1.626 m) 139 lb 6.4 oz (63.2 kg)   Constitutional: Tired appearing, well-developed, well-nourished female in no acute distress.  Cardiovascular: normal rate Respiratory: normal effort GI: Abd soft, non-tender, gravid appropriate for gestational age. Pos BS x 4 MS: Extremities nontender, no edema, normal ROM Neurologic: Alert and oriented x 4.  GU: Neg CVAT.      FHT:  145 on doppler  Contractions: N/A   Labs: No results found for this or any previous visit (from the past 24 hour(s)).  Imaging:  US Ob Limited  Result Date: 04/07/2016 CLINICAL DATA:  Slipped on ice and fell yesterday. Abdominal pain and back pain. Early pregnancy. EXAM: LIMITED OBSTETRIC ULTRASOUND COMPARISON:  02/21/2016 FINDINGS: Number of Fetuses: 1 Heart Rate:  145 bpm Movement: Present Presentation: Variable Placental Location: Fontanel Previa: None Amniotic Fluid (Subjective):  Within normal limits. BPD:  2.73cm 14w  6d MATERNAL FINDINGS: Cervix:  Appears closed. IMPRESSION: Normal appearing living single intrauterine pregnancy at 14 weeks 6 days by BPD. No traumatic finding. This exam is performed on an emergent basis and does not comprehensively evaluate fetal size, dating, or anatomy; follow-up complete OB US should be considered if further fetal assessment is warranted. Electronically Signed   By: Paulina Fusi M.D.   On: 04/07/2016 14:56   MAU  Course: Doppler for FHR Letter provided for obstetrical clearance  MDM: Plan of care reviewed with patient, including labs and tests ordered and medical treatment.  Assessment: 1. Group B streptococcal bacteriuria    Plan: IUP at [redacted]w[redacted]d.  Presents to MAU with complaints of withdrawal.  On Subutex.  Seeking detox and would like to be transferred to [facility] facility.  Unable to be accepted at this time.   Patient discharged home with list of  resources and strict return precautions.    Allergies as of 04/13/2016      Reactions   Acyclovir And Related Swelling, Other (See Comments)   Reaction:  Facial/tongue swelling   Darvocet [propoxyphene N-acetaminophen] Hives   Doxycycline Swelling, Other (See Comments)   Reaction:  Facial/tongue swelling   Flexeril [cyclobenzaprine] Nausea And Vomiting   Metoclopramide Hives   Naproxen Hives   Latex Rash   Tramadol Rash      Medication List    TAKE these medications   buprenorphine 8 MG Subl SL tablet Commonly known as:  SUBUTEX Place 8 mg under the tongue 2 (two) times daily.   folic acid 1 MG tablet Commonly known as:  FOLVITE Take 1 mg by mouth daily.   Melatonin 10 MG Tabs Take 20 mg by mouth at bedtime as needed (for sleep).   nitrofurantoin (macrocrystal-monohydrate) 100 MG capsule Commonly known as:  MACROBID Take 1 capsule (100 mg total) by mouth 2 (two) times daily.   promethazine 12.5 MG tablet Commonly known as:  PHENERGAN Take 1 tablet (12.5 mg total) by mouth every 6 (six) hours as needed for nausea or vomiting.   VALERIAN ROOT PO Take 2 capsules by mouth at bedtime as needed (for sleep).   vitamin B-12 1000 MCG tablet Commonly known as:  CYANOCOBALAMIN Take 1,000 mcg by mouth daily.       Freddrick MarchYashika Amin, MD PGY-1 04/13/2016 5:35 PM    OB FELLOW MAU DISCHARGE ATTESTATION  I have seen and examined this patient; I agree with above documentation in the resident's note.   I spoke with the  patient and gave her recommendations for outpatient clinics. She refused to give a urine sample. Patient was very angry and threatening during visit, security had to be called. She is not suicidal but is afraid she may hurt herself if she were to leave the hospital because she would use substances again (she has been using cocaine recently). She brought her suitcase of belongings with her and said her boyfriend had to go to work. At the end of the visit, the patient was able to be calm and cooperative and was [facility] by security after viability was established.    Jen MowElizabeth Ivah Girardot, DO OB Fellow 6:26 PM           HPI ROS    Physical Exam MAU Course  Procedures

## 2016-04-13 NOTE — ED Notes (Signed)
Name called to lobby x2 with no response. 

## 2016-04-13 NOTE — MAU Note (Signed)
Withdrawal from subqtex (been a day and a half), and detox. Has used street drugs (cocaine,opiates).   Is depressed.  Really needs to go to [facility] for [facility] and treatment. States is ready

## 2016-04-19 ENCOUNTER — Encounter (HOSPITAL_COMMUNITY): Payer: Self-pay | Admitting: *Deleted

## 2016-04-19 ENCOUNTER — Inpatient Hospital Stay (HOSPITAL_COMMUNITY)
Admission: AD | Admit: 2016-04-19 | Discharge: 2016-04-19 | Discharge status: Against Medical Advice | Payer: Medicaid Other | Source: Ambulatory Visit | Attending: Family Medicine | Admitting: Family Medicine

## 2016-04-19 ENCOUNTER — Inpatient Hospital Stay (HOSPITAL_COMMUNITY): Payer: Medicaid Other

## 2016-04-19 DIAGNOSIS — Z888 Allergy status to other drugs, medicaments and biological substances status: Secondary | ICD-10-CM | POA: Diagnosis not present

## 2016-04-19 DIAGNOSIS — O99332 Smoking (tobacco) complicating pregnancy, second trimester: Secondary | ICD-10-CM | POA: Diagnosis not present

## 2016-04-19 DIAGNOSIS — W1839XA Other fall on same level, initial encounter: Secondary | ICD-10-CM | POA: Insufficient documentation

## 2016-04-19 DIAGNOSIS — Z833 Family history of diabetes mellitus: Secondary | ICD-10-CM | POA: Insufficient documentation

## 2016-04-19 DIAGNOSIS — Z5321 Procedure and treatment not carried out due to patient leaving prior to being seen by health care provider: Secondary | ICD-10-CM | POA: Diagnosis not present

## 2016-04-19 DIAGNOSIS — R1030 Lower abdominal pain, unspecified: Secondary | ICD-10-CM | POA: Diagnosis not present

## 2016-04-19 DIAGNOSIS — Z881 Allergy status to other antibiotic agents status: Secondary | ICD-10-CM | POA: Insufficient documentation

## 2016-04-19 DIAGNOSIS — Z3A15 15 weeks gestation of pregnancy: Secondary | ICD-10-CM | POA: Diagnosis not present

## 2016-04-19 DIAGNOSIS — Z91040 Latex allergy status: Secondary | ICD-10-CM | POA: Diagnosis not present

## 2016-04-19 DIAGNOSIS — Y93K1 Activity, walking an animal: Secondary | ICD-10-CM | POA: Diagnosis not present

## 2016-04-19 DIAGNOSIS — R109 Unspecified abdominal pain: Secondary | ICD-10-CM

## 2016-04-19 DIAGNOSIS — F17210 Nicotine dependence, cigarettes, uncomplicated: Secondary | ICD-10-CM | POA: Insufficient documentation

## 2016-04-19 DIAGNOSIS — O26899 Other specified pregnancy related conditions, unspecified trimester: Secondary | ICD-10-CM

## 2016-04-19 DIAGNOSIS — M549 Dorsalgia, unspecified: Secondary | ICD-10-CM | POA: Diagnosis not present

## 2016-04-19 DIAGNOSIS — R8271 Bacteriuria: Secondary | ICD-10-CM

## 2016-04-19 DIAGNOSIS — O26892 Other specified pregnancy related conditions, second trimester: Secondary | ICD-10-CM | POA: Insufficient documentation

## 2016-04-19 DIAGNOSIS — W101XXA Fall (on)(from) sidewalk curb, initial encounter: Secondary | ICD-10-CM

## 2016-04-19 LAB — URINALYSIS, ROUTINE W REFLEX MICROSCOPIC
Bilirubin Urine: NEGATIVE
GLUCOSE, UA: NEGATIVE mg/dL
HGB URINE DIPSTICK: NEGATIVE
Ketones, ur: NEGATIVE mg/dL
Leukocytes, UA: NEGATIVE
Nitrite: NEGATIVE
PH: 5 (ref 5.0–8.0)
Protein, ur: NEGATIVE mg/dL
SPECIFIC GRAVITY, URINE: 1.019 (ref 1.005–1.030)

## 2016-04-19 MED ORDER — ACETAMINOPHEN 500 MG PO TABS
1000.0000 mg | ORAL_TABLET | Freq: Once | ORAL | Status: AC
  Administered 2016-04-19: 1000 mg via ORAL
  Filled 2016-04-19: qty 2

## 2016-04-19 NOTE — MAU Provider Note (Signed)
History     CSN: [id number]  Arrival date and time: 04/19/16 1015   First Provider Initiated Contact with Patient 04/19/16 1102        Chief Complaint  Patient presents with  . Fall  . Abdominal Pain  . Back Pain   HPI  [name] is a 30 y.o. [id number] at 2759w5d who presents with abdominal & back pain s/p fall. Fall occurred this morning around 530 am. States she was walking her dog when he pulled her & she fell down on the sidewalk. Landed on abdomen. Denies LOC. Lower abdominal & back pain since incident. Describes as constant cramping/aching. Rates pain 9/10. Took ibuprofen 400 mg at 545 am without relief. Denies vaginal bleeding.   OB History    Gravida Para Term Preterm AB Living   5 2 1 1 2 2    SAB TAB Ectopic Multiple Live Births   2 0 0 0 2      Past Medical History:  Diagnosis Date  . Bartholin cyst   . Endometriosis   . Glaucoma   . Headache(784.0)   . History of PID   . Ovarian cyst   . STD (female)    hx of chlamydia and gonorrhea  . Substance abuse   . Vaginal Pap smear, abnormal    has not followed up    Past Surgical History:  Procedure Laterality Date  . DILATION AND CURETTAGE OF UTERUS    . FRACTURE SURGERY     left leg  . LAPAROSCOPY      Family History  Problem Relation Age of Onset  . Diabetes Mother   . Hypertension Maternal Grandmother   . Heart disease Maternal Grandmother     great grandma  . Anesthesia problems Neg Hx   . Hypotension Neg Hx   . Malignant hyperthermia Neg Hx   . Pseudochol deficiency Neg Hx   . Alcohol abuse Neg Hx     Social History  Substance Use Topics  . Smoking status: Current Every Day Smoker    Packs/day: 0.50    Years: 7.00    Types: Cigarettes  . Smokeless tobacco: Never Used  . Alcohol use No     Comment: Denies ETOH use    Allergies:  Allergies  Allergen Reactions  . Acyclovir And Related Swelling and Other (See Comments)    Reaction:  Facial/tongue swelling  . Darvocet [Propoxyphene  N-Acetaminophen] Hives  . Doxycycline Swelling and Other (See Comments)    Reaction:  Facial/tongue swelling  . Flexeril [Cyclobenzaprine] Nausea And Vomiting  . Metoclopramide Hives  . Naproxen Hives  . Latex Rash  . Tramadol Rash    Prescriptions Prior to Admission  Medication Sig Dispense Refill Last Dose  . buprenorphine (SUBUTEX) 8 MG SUBL SL tablet Place 8 mg under the tongue 2 (two) times daily.    Past Week at Unknown time  . folic acid (FOLVITE) 1 MG tablet Take 1 mg by mouth daily.   04/13/2016 at Unknown time  . Melatonin 10 MG TABS Take 20 mg by mouth at bedtime as needed (for sleep).   04/12/2016 at Unknown time  . nitrofurantoin, macrocrystal-monohydrate, (MACROBID) 100 MG capsule Take 1 capsule (100 mg total) by mouth 2 (two) times daily. (Patient not taking: Reported on 04/13/2016) 10 capsule 0 Not Taking at Unknown time  . promethazine (PHENERGAN) 12.5 MG tablet Take 1 tablet (12.5 mg total) by mouth every 6 (six) hours as needed for nausea or vomiting. 30 tablet  0 04/12/2016 at Unknown time  . VALERIAN ROOT PO Take 2 capsules by mouth at bedtime as needed (for sleep).   04/12/2016 at Unknown time  . vitamin B-12 (CYANOCOBALAMIN) 1000 MCG tablet Take 1,000 mcg by mouth daily.   04/12/2016 at Unknown time    Review of Systems  Gastrointestinal: Positive for abdominal pain.  Genitourinary: Negative.   Musculoskeletal: Positive for back pain and falls.  Neurological: Negative for dizziness and loss of consciousness.   Physical Exam   Blood pressure 103/64, pulse (!) 55, temperature 97.6 F (36.4 C), temperature source Oral, resp. rate 16, last menstrual period 12/31/2015.  Physical Exam  Nursing note and vitals reviewed. Constitutional: She is oriented to person, place, and time. She appears well-developed and well-nourished. No distress.  HENT:  Head: Normocephalic and atraumatic.  Eyes: Conjunctivae are normal. Right eye exhibits no discharge. Left eye exhibits no  discharge. No scleral icterus.  Neck: Normal range of motion.  Cardiovascular: Normal rate, regular rhythm and normal heart sounds.   No murmur heard. Respiratory: Effort normal and breath sounds normal. No respiratory distress. She has no wheezes.  GI: Soft. Bowel sounds are normal. There is tenderness in the right lower quadrant, suprapubic area and left lower quadrant. There is no rigidity, no rebound, no guarding and no CVA tenderness.  Musculoskeletal:       Lumbar back: Normal.  Neurological: She is alert and oriented to person, place, and time.  Skin: Skin is warm and dry. She is not diaphoretic.  Psychiatric: She has a normal mood and affect. Her behavior is normal. Judgment and thought content normal.    MAU Course  Procedures Results for orders placed or performed during the hospital encounter of 04/19/16 (from the past 24 hour(s))  Urinalysis, Routine w reflex microscopic     Status: Abnormal   Collection Time: 04/19/16 10:42 AM  Result Value Ref Range   Color, Urine YELLOW YELLOW   APPearance HAZY (A) CLEAR   Specific Gravity, Urine 1.019 1.005 - 1.030   pH 5.0 5.0 - 8.0   Glucose, UA NEGATIVE NEGATIVE mg/dL   Hgb urine dipstick NEGATIVE NEGATIVE   Bilirubin Urine NEGATIVE NEGATIVE   Ketones, ur NEGATIVE NEGATIVE mg/dL   Protein, ur NEGATIVE NEGATIVE mg/dL   Nitrite NEGATIVE NEGATIVE   Leukocytes, UA NEGATIVE NEGATIVE    MDM FHT 148 by doppler Rh positive Tylenol 1 gm PO Hot pack to low back  Patient left AMA while awaiting ultrasound results  Assessment and Plan    [name] 04/19/2016, 11:02 AM

## 2016-04-19 NOTE — MAU Note (Signed)
Pt states she was walking her dog, the dog jerked her & she fell on the curb, hit her abdomen - around 0530 this morning.  Is now having lower abd pain & lower back pain ever since.  Denies vaginal bleeding.

## 2016-05-02 ENCOUNTER — Encounter (HOSPITAL_COMMUNITY): Payer: Self-pay

## 2016-05-02 ENCOUNTER — Inpatient Hospital Stay (HOSPITAL_COMMUNITY)
Admission: AD | Admit: 2016-05-02 | Discharge: 2016-05-02 | Disposition: A | Discharge status: Home / Self Care | Payer: Medicaid Other | Source: Ambulatory Visit | Attending: Family Medicine | Admitting: Family Medicine

## 2016-05-02 ENCOUNTER — Inpatient Hospital Stay (HOSPITAL_COMMUNITY): Payer: Medicaid Other

## 2016-05-02 DIAGNOSIS — Z3A17 17 weeks gestation of pregnancy: Secondary | ICD-10-CM | POA: Insufficient documentation

## 2016-05-02 DIAGNOSIS — O99512 Diseases of the respiratory system complicating pregnancy, second trimester: Secondary | ICD-10-CM | POA: Diagnosis not present

## 2016-05-02 DIAGNOSIS — O99332 Smoking (tobacco) complicating pregnancy, second trimester: Secondary | ICD-10-CM | POA: Insufficient documentation

## 2016-05-02 DIAGNOSIS — J111 Influenza due to unidentified influenza virus with other respiratory manifestations: Secondary | ICD-10-CM | POA: Insufficient documentation

## 2016-05-02 DIAGNOSIS — J101 Influenza due to other identified influenza virus with other respiratory manifestations: Secondary | ICD-10-CM

## 2016-05-02 DIAGNOSIS — R8271 Bacteriuria: Secondary | ICD-10-CM

## 2016-05-02 DIAGNOSIS — R109 Unspecified abdominal pain: Secondary | ICD-10-CM | POA: Diagnosis present

## 2016-05-02 DIAGNOSIS — F17210 Nicotine dependence, cigarettes, uncomplicated: Secondary | ICD-10-CM | POA: Diagnosis not present

## 2016-05-02 DIAGNOSIS — J189 Pneumonia, unspecified organism: Secondary | ICD-10-CM

## 2016-05-02 LAB — CBC WITH DIFFERENTIAL/PLATELET
BASOS PCT: 0 %
Basophils Absolute: 0 10*3/uL (ref 0.0–0.1)
Eosinophils Absolute: 0 10*3/uL (ref 0.0–0.7)
Eosinophils Relative: 0 %
HEMATOCRIT: 29.1 % — AB (ref 36.0–46.0)
Hemoglobin: 10.3 g/dL — ABNORMAL LOW (ref 12.0–15.0)
LYMPHS ABS: 0.7 10*3/uL (ref 0.7–4.0)
Lymphocytes Relative: 9 %
MCH: 30.7 pg (ref 26.0–34.0)
MCHC: 35.4 g/dL (ref 30.0–36.0)
MCV: 86.6 fL (ref 78.0–100.0)
MONO ABS: 0.5 10*3/uL (ref 0.1–1.0)
MONOS PCT: 6 %
NEUTROS ABS: 6.7 10*3/uL (ref 1.7–7.7)
Neutrophils Relative %: 85 %
Platelets: 255 10*3/uL (ref 150–400)
RBC: 3.36 MIL/uL — ABNORMAL LOW (ref 3.87–5.11)
RDW: 14.4 % (ref 11.5–15.5)
WBC: 7.8 10*3/uL (ref 4.0–10.5)

## 2016-05-02 LAB — INFLUENZA PANEL BY PCR (TYPE A & B)
INFLBPCR: NEGATIVE
Influenza A By PCR: POSITIVE — AB

## 2016-05-02 LAB — URINALYSIS, ROUTINE W REFLEX MICROSCOPIC
BILIRUBIN URINE: NEGATIVE
GLUCOSE, UA: NEGATIVE mg/dL
KETONES UR: 20 mg/dL — AB
NITRITE: NEGATIVE
Protein, ur: NEGATIVE mg/dL
Specific Gravity, Urine: 1.011 (ref 1.005–1.030)
pH: 7 (ref 5.0–8.0)

## 2016-05-02 MED ORDER — ACETAMINOPHEN 325 MG PO TABS
650.0000 mg | ORAL_TABLET | Freq: Four times a day (QID) | ORAL | Status: DC | PRN
  Administered 2016-05-02: 650 mg via ORAL
  Filled 2016-05-02: qty 2

## 2016-05-02 MED ORDER — PROMETHAZINE HCL 25 MG PO TABS
12.5000 mg | ORAL_TABLET | Freq: Four times a day (QID) | ORAL | Status: DC | PRN
  Administered 2016-05-02: 12.5 mg via ORAL
  Filled 2016-05-02: qty 1

## 2016-05-02 MED ORDER — OSELTAMIVIR PHOSPHATE 75 MG PO CAPS
75.0000 mg | ORAL_CAPSULE | Freq: Once | ORAL | Status: AC
  Administered 2016-05-02: 75 mg via ORAL
  Filled 2016-05-02: qty 1

## 2016-05-02 MED ORDER — OSELTAMIVIR PHOSPHATE 75 MG PO CAPS: 75.0000 mg | ORAL_CAPSULE | Freq: Two times a day (BID) | ORAL | 0 refills | Status: AC

## 2016-05-02 MED ORDER — OSELTAMIVIR PHOSPHATE 75 MG PO CAPS: 75.0000 mg | ORAL_CAPSULE | Freq: Once | ORAL | 0 refills | Status: AC

## 2016-05-02 NOTE — MAU Note (Signed)
Pt vomiting for the past 2 days, unable to keep anything down.  Denies diarrhea.  Reports temp of 101 at home today.  Has body aches, has sore throat & cough.

## 2016-05-02 NOTE — MAU Provider Note (Signed)
History   Patient [name] is a 42 yaer old [phone] at 17 weeks and 4 days here with complaints of abdominal pain, coughing, and feeling unwell for the past 2 days.   CSN: [id number]  Arrival date and time: 05/02/16 1609   First Provider Initiated Contact with Patient 05/02/16 1749      Chief Complaint  Patient presents with  . Emesis During Pregnancy  . Fever  . Generalized Body Aches  . Cough   URI   This is a new problem. The current episode started in the past 7 days. The problem has been unchanged. The maximum temperature recorded prior to her arrival was 100.4 - 100.9 F. Associated symptoms include abdominal pain, congestion and coughing. Pertinent negatives include no chest pain, diarrhea, dysuria, ear pain, headaches, joint pain, joint swelling, nausea, neck pain, plugged ear sensation, rash, rhinorrhea, sinus pain, sneezing, sore throat, swollen glands, vomiting or wheezing. She has tried NSAIDs for the symptoms. The treatment provided no relief.    OB History    Gravida Para Term Preterm AB Living   5 2 1 1 2 2    SAB TAB Ectopic Multiple Live Births   2 0 0 0 2      Past Medical History:  Diagnosis Date  . Bartholin cyst   . Endometriosis   . Glaucoma   . Headache(784.0)   . History of PID   . Ovarian cyst   . STD (female)    hx of chlamydia and gonorrhea  . Substance abuse   . Vaginal Pap smear, abnormal    has not followed up    Past Surgical History:  Procedure Laterality Date  . DILATION AND CURETTAGE OF UTERUS    . FRACTURE SURGERY     left leg  . LAPAROSCOPY      Family History  Problem Relation Age of Onset  . Diabetes Mother   . Hypertension Maternal Grandmother   . Heart disease Maternal Grandmother     great grandma  . Anesthesia problems Neg Hx   . Hypotension Neg Hx   . Malignant hyperthermia Neg Hx   . Pseudochol deficiency Neg Hx   . Alcohol abuse Neg Hx     Social History  Substance Use Topics  . Smoking status: Current  Every Day Smoker    Packs/day: 0.50    Years: 7.00    Types: Cigarettes  . Smokeless tobacco: Never Used  . Alcohol use No     Comment: Denies ETOH use    Allergies:  Allergies  Allergen Reactions  . Acyclovir And Related Swelling and Other (See Comments)    Reaction:  Facial/tongue swelling  . Darvocet [Propoxyphene N-Acetaminophen] Hives  . Doxycycline Swelling and Other (See Comments)    Reaction:  Facial/tongue swelling  . Flexeril [Cyclobenzaprine] Nausea And Vomiting  . Metoclopramide Hives  . Naproxen Hives  . Latex Rash  . Tramadol Rash    Prescriptions Prior to Admission  Medication Sig Dispense Refill Last Dose  . buprenorphine (SUBUTEX) 8 MG SUBL SL tablet Place 8 mg under the tongue 2 (two) times daily.    05/01/2016 at Unknown time  . folic acid (FOLVITE) 1 MG tablet Take 1 mg by mouth daily.   Past Week at Unknown time  . ibuprofen (ADVIL,MOTRIN) 200 MG tablet Take 400 mg by mouth every 6 (six) hours as needed for fever or mild pain.   05/02/2016 at Unknown time  . Melatonin 10 MG TABS Take 20 mg  by mouth at bedtime as needed (for sleep).   Past Week at Unknown time  . promethazine (PHENERGAN) 12.5 MG tablet Take 1 tablet (12.5 mg total) by mouth every 6 (six) hours as needed for nausea or vomiting. 30 tablet 0 05/02/2016 at Unknown time  . VALERIAN ROOT PO Take 2 capsules by mouth at bedtime as needed (for sleep).   Past Week at Unknown time  . vitamin B-12 (CYANOCOBALAMIN) 1000 MCG tablet Take 1,000 mcg by mouth daily.   Past Week at Unknown time  . nitrofurantoin, macrocrystal-monohydrate, (MACROBID) 100 MG capsule Take 1 capsule (100 mg total) by mouth 2 (two) times daily. (Patient not taking: Reported on 05/02/2016) 10 capsule 0 Not Taking at Unknown time    Review of Systems  Constitutional: Positive for chills, fatigue and fever.  HENT: Positive for congestion. Negative for ear pain, rhinorrhea, sinus pain, sneezing and sore throat.   Respiratory: Positive for  cough. Negative for wheezing.   Cardiovascular: Negative for chest pain.  Gastrointestinal: Positive for abdominal pain. Negative for diarrhea, nausea and vomiting.  Endocrine: Negative.  Negative for cold intolerance.  Genitourinary: Negative for dysuria.  Musculoskeletal: Negative for joint pain and neck pain.  Skin: Negative for rash.  Allergic/Immunologic: Negative.   Neurological: Negative for headaches.  Hematological: Negative.   Psychiatric/Behavioral: Negative.    Physical Exam   Blood pressure 118/63, pulse 98, temperature 100.9 F (38.3 C), temperature source Oral, last menstrual period 12/31/2015, SpO2 96 %.  Physical Exam  Constitutional: She is oriented to person, place, and time. She appears well-developed and well-nourished.  HENT:  Head: Normocephalic.  Neck: Normal range of motion.  Cardiovascular: Normal rate and regular rhythm.   Respiratory: Effort normal. No respiratory distress. She has no wheezes. She has no rales. She exhibits no tenderness.  Patient has trouble taking a deep breath without coughing, but when she did inhale I heard occasional crackles over her upper lung fields and in her right lower lobe.   GI: Soft. Bowel sounds are normal. She exhibits no distension and no mass. There is no tenderness. There is no rebound and no guarding.  Musculoskeletal: Normal range of motion.  Neurological: She is alert and oriented to person, place, and time.  Skin: Skin is warm and dry.    MAU Course  Procedures  MDM -Chest x-ray is clear; no signs of pneumonia -Flu swab positive for Influenza A; given one dose of Tamiflu in MAU -phenergan and tylenol for pain FHR is 142  Assessment and Plan   1. Influenza A 2. Discharge patient after 1 dose of Tamiflu; 5 day course of Tamiflu sent to the pharmacy on file. Patient given list of safe medications in pregnancy and encouraged to establish prenatal care.    Charlesetta GaribaldiKathryn Lorraine Macie Baum CNM 05/02/2016, 5:50 PM

## 2016-05-02 NOTE — Discharge Instructions (Signed)

## 2016-05-03 ENCOUNTER — Emergency Department (HOSPITAL_COMMUNITY)
Admission: EM | Admit: 2016-05-03 | Discharge: 2016-05-04 | Disposition: A | Discharge status: Home / Self Care | Payer: Medicaid Other | Attending: Emergency Medicine | Admitting: Emergency Medicine

## 2016-05-03 ENCOUNTER — Encounter (HOSPITAL_COMMUNITY): Payer: Self-pay

## 2016-05-03 DIAGNOSIS — J111 Influenza due to unidentified influenza virus with other respiratory manifestations: Secondary | ICD-10-CM | POA: Insufficient documentation

## 2016-05-03 DIAGNOSIS — F17210 Nicotine dependence, cigarettes, uncomplicated: Secondary | ICD-10-CM | POA: Insufficient documentation

## 2016-05-03 DIAGNOSIS — Z91040 Latex allergy status: Secondary | ICD-10-CM | POA: Insufficient documentation

## 2016-05-03 NOTE — ED Triage Notes (Signed)
[facility] for flu like Sx.  Patient was diagnosed with flu, given Tamiflu and was told to return if got worse.  Patient is x4 months pregnant.

## 2016-05-04 MED ORDER — PROMETHAZINE HCL 25 MG PO TABS
25.0000 mg | ORAL_TABLET | Freq: Once | ORAL | Status: DC
  Filled 2016-05-04: qty 1

## 2016-05-04 MED ORDER — PROMETHAZINE HCL 25 MG/ML IJ SOLN
25.0000 mg | Freq: Once | INTRAMUSCULAR | Status: AC
  Administered 2016-05-04: 25 mg via INTRAVENOUS
  Filled 2016-05-04: qty 1

## 2016-05-04 MED ORDER — ACETAMINOPHEN 325 MG PO TABS
650.0000 mg | ORAL_TABLET | Freq: Once | ORAL | Status: AC
  Administered 2016-05-04: 650 mg via ORAL
  Filled 2016-05-04: qty 2

## 2016-05-04 MED ORDER — SODIUM CHLORIDE 0.9 % IV BOLUS (SEPSIS)
1000.0000 mL | Freq: Once | INTRAVENOUS | Status: AC
  Administered 2016-05-04: 1000 mL via INTRAVENOUS

## 2016-05-04 NOTE — ED Provider Notes (Signed)
[facility]-EMERGENCY DEPT Provider Note   CSN: [id number] Arrival date & time: 05/03/16  2340  History   Chief Complaint Chief Complaint  Patient presents with  . Influenza    HPI [name] is a 31 y.o. female.  HPI  31 y.o. female [phone] at 17 weeks, presents to the Emergency Department today due to worsening cough and congestion from yesterday. Pt seen at [facility] for same and told to come to ED if she worsened. Diagnosed with Flu and given Tamiflu. Chest radiographs from yesterday negative for infiltrate. Pt states she has had decrease in PO intake. Cough, congestion, mild abdominal pain, rhinorrhea. No CP. Mild SOB. Notes nausea without emesis. NO diarrhea. Pt still taking Tamiflu. No other symptoms noted.   Past Medical History:  Diagnosis Date  . Bartholin cyst   . Endometriosis   . Glaucoma   . Headache(784.0)   . History of PID   . Ovarian cyst   . STD (female)    hx of chlamydia and gonorrhea  . Substance abuse   . Vaginal Pap smear, abnormal    has not followed up    Patient Active Problem List   Diagnosis Date Noted  . Influenza A 05/02/2016  . Group B streptococcal bacteriuria 03/27/2016  . MDD (major depressive disorder), recurrent, severe, with psychosis (HCC) 03/28/2015  . Cocaine abuse with cocaine-induced mood disorder (HCC) 03/27/2015  . Normal labor 12/10/2013  . Opiate dependence, continuous (HCC) 08/07/2013  . Cocaine abuse 08/07/2013  . Nausea/vomiting in pregnancy 07/08/2013  . Pica 07/08/2013  . Polysubstance dependence including opioid type drug, continuous use (HCC) 06/17/2013  . PTSD (post-traumatic stress disorder) 06/17/2013  . Unspecified vitamin D deficiency 06/04/2013  . Sickle cell trait (HCC) 06/04/2013  . Trichomonal vaginitis in pregnancy in second trimester 06/02/2013  . Polysubstance abuse 05/31/2013  . Tobacco use complicating pregnancy 05/30/2013  . Unspecified high-risk pregnancy 05/30/2013  . Marijuana use 05/27/2013   . Opioid dependence (HCC) 09/09/2011  . Pelvic pain in female 12/07/2010  . DUB (dysfunctional uterine bleeding) 12/07/2010  . History of PID 12/07/2010    Past Surgical History:  Procedure Laterality Date  . DILATION AND CURETTAGE OF UTERUS    . FRACTURE SURGERY     left leg  . LAPAROSCOPY      OB History    Gravida Para Term Preterm AB Living   5 2 1 1 2 2    SAB TAB Ectopic Multiple Live Births   2 0 0 0 2       Home Medications    Prior to Admission medications   Medication Sig Start Date End Date Taking? Authorizing Provider  buprenorphine (SUBUTEX) 8 MG SUBL SL tablet Place 8 mg under the tongue 2 (two) times daily.     Historical Provider, MD  folic acid (FOLVITE) 1 MG tablet Take 1 mg by mouth daily.    Historical Provider, MD  Melatonin 10 MG TABS Take 20 mg by mouth at bedtime as needed (for sleep).    Historical Provider, MD  oseltamivir (TAMIFLU) 75 MG capsule Take 1 capsule (75 mg total) by mouth 2 (two) times daily. 05/02/16 05/07/16  Marylene Land, CNM  promethazine (PHENERGAN) 12.5 MG tablet Take 1 tablet (12.5 mg total) by mouth every 6 (six) hours as needed for nausea or vomiting. 04/07/16   Courteney Lyn Mackuen, MD  vitamin B-12 (CYANOCOBALAMIN) 1000 MCG tablet Take 1,000 mcg by mouth daily.    Historical Provider, MD  Family History Family History  Problem Relation Age of Onset  . Diabetes Mother   . Hypertension Maternal Grandmother   . Heart disease Maternal Grandmother     great grandma  . Anesthesia problems Neg Hx   . Hypotension Neg Hx   . Malignant hyperthermia Neg Hx   . Pseudochol deficiency Neg Hx   . Alcohol abuse Neg Hx     Social History Social History  Substance Use Topics  . Smoking status: Current Every Day Smoker    Packs/day: 0.50    Years: 7.00    Types: Cigarettes  . Smokeless tobacco: Never Used  . Alcohol use No     Comment: Denies ETOH use     Allergies   Acyclovir and related; Darvocet  [propoxyphene n-acetaminophen]; Doxycycline; Flexeril [cyclobenzaprine]; Metoclopramide; Naproxen; Latex; and Tramadol   Review of Systems Review of Systems  Constitutional: Positive for fever.  HENT: Positive for congestion, rhinorrhea, sinus pressure and sore throat.   Respiratory: Positive for cough and shortness of breath.   Cardiovascular: Negative for chest pain.  Gastrointestinal: Positive for nausea. Negative for diarrhea and vomiting.   Physical Exam Updated Vital Signs BP 115/74 (BP Location: Left Arm)   Pulse 98   Temp 98.9 F (37.2 C) (Oral)   Resp 18   LMP 12/31/2015 (Approximate)   SpO2 94%   Physical Exam  Constitutional: She is oriented to person, place, and time. Vital signs are normal. She appears well-developed and well-nourished. No distress.  HENT:  Head: Normocephalic and atraumatic.  Right Ear: Hearing, tympanic membrane, external ear and ear canal normal.  Left Ear: Hearing, tympanic membrane, external ear and ear canal normal.  Nose: Nose normal.  Mouth/Throat: Uvula is midline, oropharynx is clear and moist and mucous membranes are normal. No trismus in the jaw. No oropharyngeal exudate, posterior oropharyngeal erythema or tonsillar abscesses.  Eyes: Conjunctivae and EOM are normal. Pupils are equal, round, and reactive to light.  Neck: Normal range of motion. Neck supple. No tracheal deviation present.  Cardiovascular: Normal rate, regular rhythm, S1 normal, S2 normal, normal heart sounds, intact distal pulses and normal pulses.   Pulmonary/Chest: Effort normal and breath sounds normal. No respiratory distress. She has no decreased breath sounds. She has no wheezes. She has no rhonchi. She has no rales.  Abdominal: Normal appearance and bowel sounds are normal. There is no tenderness.  Musculoskeletal: Normal range of motion.  Neurological: She is alert and oriented to person, place, and time.  Skin: Skin is warm and dry.  Psychiatric: She has a normal  mood and affect. Her speech is normal and behavior is normal. Thought content normal.   ED Treatments / Results  Labs (all labs ordered are listed, but only abnormal results are displayed) Labs Reviewed - No data to display  EKG  EKG Interpretation None       Radiology Dg Chest 2 View  Result Date: 05/02/2016 CLINICAL DATA:  Productive cough for 3 days. Sore throat. Fever. Early pregnancy. EXAM: CHEST  2 VIEW COMPARISON:  01/15/2011 and 10/04/2009 CT FINDINGS: The patient's abdomen was shielded. The lungs appear clear. Cardiac and mediastinal contours normal. No pleural effusion identified. IMPRESSION: 1.  No significant abnormality identified. Electronically Signed   By: Gaylyn RongWalter  Liebkemann M.D.   On: 05/02/2016 18:32    Procedures Procedures (including critical care time)  Medications Ordered in ED Medications - No data to display   Initial Impression / Assessment and Plan / ED Course  I have  reviewed the triage vital signs and the nursing notes.  Pertinent labs & imaging results that were available during my care of the patient were reviewed by me and considered in my medical decision making (see chart for details).  Clinical Course    Final Clinical Impressions(s) / ED Diagnoses  {I have reviewed and evaluated the relevant laboratory values. {I have reviewed and evaluated the relevant imaging studies.  {I have reviewed the relevant previous healthcare records.  {I obtained HPI from historian. {Patient discussed with supervising physician.  ED Course:  Assessment: Pt is a 30yF [id number] at 17 weeks who presents due to worsening flu symptoms since yesterday. Seen at [facility]. Neg Chest Radiographs. CBC/BMP unremarkable yesterday. Pos Flu. Started on Tamiflu. Notes decrease in PO intake. Cough/congestion. No new symptoms from previous. On exam, pt in NAD. Nontoxic/nonseptic appearing. VSS. Afebrile. Lungs CTA. Heart RRR. Abdomen nontender soft. Given fluids and analgesia in ED. Nursing  notified Rapid [facility] response. Due to gestation they are available for questions if needed. I doubt any acute intraabdominal abnormalities. Able to tolerate PO. Of note, pt recently broke up with boyfriend today and is without a place to live. Suspect social component to visit. Anticipate DC home as pt stable VS. Able to tolerate PO. Able to ambulate.   Disposition/Plan:  DC Home  Supervising Physician Marily Memos, MD  Final diagnoses:  Influenza    New Prescriptions New Prescriptions   No medications on file     Kathy Pili, PA-C 05/04/16 0454    Marily Memos, MD 05/04/16 [phone]

## 2016-05-04 NOTE — ED Notes (Signed)
[facility].  Advised that per patient she is [redacted] weeks pregnant. Barkley BrunsKristine advised to contact her with any questions.

## 2016-05-04 NOTE — Discharge Instructions (Signed)
Please read and follow all provided instructions.  Your diagnoses today include:  1. Influenza     Tests performed today include: Vital signs. See below for your results today.   Medications prescribed:  Take as prescribed   Home care instructions:  Follow any educational materials contained in this packet.  Follow-up instructions: Please follow-up with your primary care provider for further evaluation of symptoms and treatment   Return instructions:  Please return to the Emergency Department if you do not get better, if you get worse, or new symptoms OR  - Fever (temperature greater than 101.59F)  - Bleeding that does not stop with holding pressure to the area    -Severe pain (please note that you may be more sore the day after your accident)  - Chest Pain  - Difficulty breathing  - Severe nausea or vomiting  - Inability to tolerate food and liquids  - Passing out  - Skin becoming red around your wounds  - Change in mental status (confusion or lethargy)  - New numbness or weakness    Please return if you have any other emergent concerns.  Additional Information:  Your vital signs today were: BP 115/74 (BP Location: Left Arm)    Pulse 98    Temp 98.9 F (37.2 C) (Oral)    Resp 18    LMP 12/31/2015 (Approximate)    SpO2 94%  If your blood pressure (BP) was elevated above 135/85 this visit, please have this repeated by your doctor within one month. ---------------

## 2016-05-21 ENCOUNTER — Emergency Department (HOSPITAL_COMMUNITY)
Admission: EM | Admit: 2016-05-21 | Discharge: 2016-05-21 | Disposition: A | Discharge status: Home / Self Care | Payer: Medicaid Other | Attending: Emergency Medicine | Admitting: Emergency Medicine

## 2016-05-21 ENCOUNTER — Encounter (HOSPITAL_COMMUNITY): Payer: Self-pay | Admitting: Emergency Medicine

## 2016-05-21 DIAGNOSIS — F1410 Cocaine abuse, uncomplicated: Secondary | ICD-10-CM | POA: Diagnosis not present

## 2016-05-21 DIAGNOSIS — F1910 Other psychoactive substance abuse, uncomplicated: Secondary | ICD-10-CM

## 2016-05-21 DIAGNOSIS — Y999 Unspecified external cause status: Secondary | ICD-10-CM | POA: Insufficient documentation

## 2016-05-21 DIAGNOSIS — F17210 Nicotine dependence, cigarettes, uncomplicated: Secondary | ICD-10-CM | POA: Insufficient documentation

## 2016-05-21 DIAGNOSIS — Z91040 Latex allergy status: Secondary | ICD-10-CM | POA: Insufficient documentation

## 2016-05-21 DIAGNOSIS — O99332 Smoking (tobacco) complicating pregnancy, second trimester: Secondary | ICD-10-CM | POA: Diagnosis not present

## 2016-05-21 DIAGNOSIS — O2342 Unspecified infection of urinary tract in pregnancy, second trimester: Secondary | ICD-10-CM | POA: Diagnosis not present

## 2016-05-21 DIAGNOSIS — Y929 Unspecified place or not applicable: Secondary | ICD-10-CM | POA: Diagnosis not present

## 2016-05-21 DIAGNOSIS — Z3A18 18 weeks gestation of pregnancy: Secondary | ICD-10-CM | POA: Diagnosis not present

## 2016-05-21 DIAGNOSIS — O9A212 Injury, poisoning and certain other consequences of external causes complicating pregnancy, second trimester: Secondary | ICD-10-CM | POA: Diagnosis not present

## 2016-05-21 DIAGNOSIS — Y939 Activity, unspecified: Secondary | ICD-10-CM | POA: Insufficient documentation

## 2016-05-21 DIAGNOSIS — Z79899 Other long term (current) drug therapy: Secondary | ICD-10-CM | POA: Insufficient documentation

## 2016-05-21 DIAGNOSIS — F1210 Cannabis abuse, uncomplicated: Secondary | ICD-10-CM | POA: Insufficient documentation

## 2016-05-21 DIAGNOSIS — N390 Urinary tract infection, site not specified: Secondary | ICD-10-CM

## 2016-05-21 DIAGNOSIS — O906 Postpartum mood disturbance: Secondary | ICD-10-CM | POA: Diagnosis not present

## 2016-05-21 DIAGNOSIS — O99322 Drug use complicating pregnancy, second trimester: Secondary | ICD-10-CM | POA: Insufficient documentation

## 2016-05-21 DIAGNOSIS — F640 Transsexualism: Secondary | ICD-10-CM

## 2016-05-21 LAB — COMPREHENSIVE METABOLIC PANEL
ALK PHOS: 53 U/L (ref 38–126)
ALT: 12 U/L — ABNORMAL LOW (ref 14–54)
ANION GAP: 8 (ref 5–15)
AST: 18 U/L (ref 15–41)
Albumin: 3 g/dL — ABNORMAL LOW (ref 3.5–5.0)
BILIRUBIN TOTAL: 0.3 mg/dL (ref 0.3–1.2)
BUN: <5 mg/dL — ABNORMAL LOW (ref 6–20)
CALCIUM: 8.7 mg/dL — AB (ref 8.9–10.3)
CO2: 23 mmol/L (ref 22–32)
Chloride: 105 mmol/L (ref 101–111)
Creatinine, Ser: 0.62 mg/dL (ref 0.44–1.00)
GFR calc non Af Amer: >60 mL/min (ref >60)
GLUCOSE: 94 mg/dL (ref 65–99)
POTASSIUM: 3.5 mmol/L (ref 3.5–5.1)
Sodium: 136 mmol/L (ref 135–145)
TOTAL PROTEIN: 6.5 g/dL (ref 6.5–8.1)

## 2016-05-21 LAB — URINALYSIS, ROUTINE W REFLEX MICROSCOPIC
Bilirubin Urine: NEGATIVE
Glucose, UA: NEGATIVE mg/dL
Ketones, ur: 5 mg/dL — AB
Nitrite: NEGATIVE
PH: 5 (ref 5.0–8.0)
Protein, ur: NEGATIVE mg/dL
SPECIFIC GRAVITY, URINE: 1.013 (ref 1.005–1.030)

## 2016-05-21 LAB — OB RESULTS CONSOLE GBS: GBS: POSITIVE

## 2016-05-21 LAB — RAPID URINE DRUG SCREEN, HOSP PERFORMED
AMPHETAMINES: NOT DETECTED
BARBITURATES: NOT DETECTED
BENZODIAZEPINES: POSITIVE — AB
Cocaine: POSITIVE — AB
OPIATES: NOT DETECTED
TETRAHYDROCANNABINOL: POSITIVE — AB

## 2016-05-21 LAB — CBC WITH DIFFERENTIAL/PLATELET
BASOS ABS: 0 10*3/uL (ref 0.0–0.1)
BASOS PCT: 0 %
EOS ABS: 0.1 10*3/uL (ref 0.0–0.7)
Eosinophils Relative: 0 %
HEMATOCRIT: 30.1 % — AB (ref 36.0–46.0)
Hemoglobin: 10.6 g/dL — ABNORMAL LOW (ref 12.0–15.0)
Lymphocytes Relative: 18 %
Lymphs Abs: 2.1 10*3/uL (ref 0.7–4.0)
MCH: 30.3 pg (ref 26.0–34.0)
MCHC: 35.2 g/dL (ref 30.0–36.0)
MCV: 86 fL (ref 78.0–100.0)
MONOS PCT: 7 %
Monocytes Absolute: 0.8 10*3/uL (ref 0.1–1.0)
NEUTROS ABS: 8.4 10*3/uL — AB (ref 1.7–7.7)
NEUTROS PCT: 75 %
Platelets: 479 10*3/uL — ABNORMAL HIGH (ref 150–400)
RBC: 3.5 MIL/uL — AB (ref 3.87–5.11)
RDW: 13.7 % (ref 11.5–15.5)
WBC: 11.3 10*3/uL — AB (ref 4.0–10.5)

## 2016-05-21 LAB — ETHANOL: Alcohol, Ethyl (B): <5 mg/dL (ref <5)

## 2016-05-21 MED ORDER — MECLIZINE HCL 25 MG PO TABS: 25.0000 mg | ORAL_TABLET | Freq: Three times a day (TID) | ORAL | Status: DC | PRN

## 2016-05-21 MED ORDER — FOSFOMYCIN TROMETHAMINE 3 G PO PACK
3.0000 g | PACK | Freq: Once | ORAL | Status: AC
  Administered 2016-05-21: 3 g via ORAL
  Filled 2016-05-21 (×2): qty 3

## 2016-05-21 MED ORDER — ALUM & MAG HYDROXIDE-SIMETH 200-200-20 MG/5ML PO SUSP: 30.0000 mL | ORAL | Status: DC | PRN

## 2016-05-21 MED ORDER — NICOTINE 21 MG/24HR TD PT24: 21.0000 mg | MEDICATED_PATCH | Freq: Every day | TRANSDERMAL | Status: DC

## 2016-05-21 MED ORDER — PROMETHAZINE HCL 25 MG PO TABS: 25.0000 mg | ORAL_TABLET | Freq: Once | ORAL | Status: DC

## 2016-05-21 MED ORDER — MECLIZINE HCL 25 MG PO TABS
25.0000 mg | ORAL_TABLET | Freq: Once | ORAL | Status: AC
  Administered 2016-05-21: 25 mg via ORAL
  Filled 2016-05-21: qty 1

## 2016-05-21 NOTE — ED Notes (Signed)
Pt leaving ER in NAD. Given detox resources. Verbalizes understanding of discharge instructions. Ambulatory at departure.
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
## 2016-05-21 NOTE — ED Notes (Signed)
Family at bedside. 
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
## 2016-05-21 NOTE — ED Notes (Signed)
Patient seen entering ER through main doors in regular clothing with mask on, proceeded to re-enter patient room. Patient states "they Clelia Schaumanngonna keep me so I know they don't let me smoke, so I went to smoke my last cigarette." patient advised not to leave ER, PA Abigail at bedside endorsing same.

## 2016-05-21 NOTE — ED Notes (Signed)
Pt given Malaw[place]key sandwich and Sprite; breakfast tray ordered

## 2016-05-21 NOTE — ED Notes (Signed)
Upon entering room to round on patient and remove TTS machine, patient gown laying on stretcher, purse at bedside, however patient nowhere to be found. All restrooms checked. Hallways checked. Security notified to help search for patient. Pt is not HI/SI, here for detox.

## 2016-05-21 NOTE — ED Notes (Signed)
Pt lunch tray ordered.

## 2016-05-21 NOTE — ED Notes (Signed)
Patient [facility] aware of [facility], cooperative, patient belongings removed to nurses station to be inventoried. Patient changed into blue scrubs.

## 2016-05-21 NOTE — Discharge Instructions (Signed)
You have been treated for a urinary tract infection in the ER. You may see your Midwife or [facility] for your nausea and pregnancy related needs. You should not smoke, drink, use ibuprofen/aleve/ NSAIDS or use drugs while pregnant.
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
## 2016-05-23 LAB — URINE CULTURE

## 2016-05-24 ENCOUNTER — Telehealth: Payer: Self-pay | Admitting: Emergency Medicine
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
## 2016-05-24 NOTE — Telephone Encounter (Signed)
Post ED Visit - Positive Culture Follow-up: Successful Patient Follow-Up  Culture assessed and recommendations reviewed by: []  Enzo BiNathan Batchelder, Pharm.D. []  Celedonio MiyamotoJeremy Frens, Pharm.D., BCPS []  Garvin FilaMike Maccia, Pharm.D. []  Georgina PillionElizabeth Martin, [address]Ph[address]D., BCPS []  CaryMinh Pham, [address]Ph[address]D., BCPS, AAHIVP []  Estella HuskMichelle Turner, Pharm.D., BCPS, AAHIVP []  Tennis Mustassie Stewart, Pharm.D. []  Sherle Poeob Vincent, [address]Angela Burke. Maggie Shuda PharmD  Positive urine culture  [x]  Patient discharged without antimicrobial prescription and treatment is now indicated []  Organism is resistant to prescribed ED discharge antimicrobial []  Patient with positive blood cultures  Changes discussed with ED provider: Audry Piliyler Mohr PA New antibiotic prescription start Amoxicillin 500mg  po bid x 7 days  Attempting to contact patient   Berle MullMiller, Sariyah Corcino 05/24/2016, 3:09 PM

## 2016-07-18 ENCOUNTER — Inpatient Hospital Stay (HOSPITAL_COMMUNITY)
Admission: AD | Admit: 2016-07-18 | Discharge: 2016-07-18 | Discharge status: Against Medical Advice | Payer: Medicaid Other | Source: Ambulatory Visit | Attending: Obstetrics and Gynecology | Admitting: Obstetrics and Gynecology

## 2016-07-18 DIAGNOSIS — Z5321 Procedure and treatment not carried out due to patient leaving prior to being seen by health care provider: Secondary | ICD-10-CM | POA: Diagnosis not present

## 2016-07-18 DIAGNOSIS — R8271 Bacteriuria: Secondary | ICD-10-CM

## 2016-07-18 DIAGNOSIS — Z3A28 28 weeks gestation of pregnancy: Secondary | ICD-10-CM | POA: Insufficient documentation

## 2016-07-18 DIAGNOSIS — O3680X0 Pregnancy with inconclusive fetal viability, not applicable or unspecified: Secondary | ICD-10-CM | POA: Diagnosis not present

## 2016-07-18 NOTE — MAU Note (Signed)
Pt states she needs "fetal viability test."  Is trying to get enrolled in ADS for subutex, needs documentation for that.  Pt denies pain, bleeding or LOF.

## 2016-07-18 NOTE — MAU Note (Signed)
Callled Pt @ [phone] and 1417.Pt was not in the lobby.

## 2016-07-19 ENCOUNTER — Inpatient Hospital Stay (HOSPITAL_COMMUNITY)
Admission: AD | Admit: 2016-07-19 | Discharge: 2016-07-19 | Disposition: A | Discharge status: Home / Self Care | Payer: Medicaid Other | Source: Ambulatory Visit | Attending: Family Medicine | Admitting: Family Medicine

## 2016-07-19 ENCOUNTER — Telehealth: Payer: Self-pay | Admitting: Obstetrics & Gynecology

## 2016-07-19 DIAGNOSIS — F17210 Nicotine dependence, cigarettes, uncomplicated: Secondary | ICD-10-CM | POA: Diagnosis not present

## 2016-07-19 DIAGNOSIS — Z881 Allergy status to other antibiotic agents status: Secondary | ICD-10-CM | POA: Diagnosis not present

## 2016-07-19 DIAGNOSIS — Z3493 Encounter for supervision of normal pregnancy, unspecified, third trimester: Secondary | ICD-10-CM

## 2016-07-19 DIAGNOSIS — O99333 Smoking (tobacco) complicating pregnancy, third trimester: Secondary | ICD-10-CM | POA: Insufficient documentation

## 2016-07-19 DIAGNOSIS — H409 Unspecified glaucoma: Secondary | ICD-10-CM | POA: Insufficient documentation

## 2016-07-19 DIAGNOSIS — Z888 Allergy status to other drugs, medicaments and biological substances status: Secondary | ICD-10-CM | POA: Insufficient documentation

## 2016-07-19 DIAGNOSIS — Z91040 Latex allergy status: Secondary | ICD-10-CM | POA: Diagnosis not present

## 2016-07-19 DIAGNOSIS — O0933 Supervision of pregnancy with insufficient antenatal care, third trimester: Secondary | ICD-10-CM | POA: Diagnosis present

## 2016-07-19 DIAGNOSIS — Z3A28 28 weeks gestation of pregnancy: Secondary | ICD-10-CM | POA: Insufficient documentation

## 2016-07-19 DIAGNOSIS — Z8619 Personal history of other infectious and parasitic diseases: Secondary | ICD-10-CM | POA: Diagnosis not present

## 2016-07-19 DIAGNOSIS — Z833 Family history of diabetes mellitus: Secondary | ICD-10-CM | POA: Diagnosis not present

## 2016-07-19 DIAGNOSIS — Z8249 Family history of ischemic heart disease and other diseases of the circulatory system: Secondary | ICD-10-CM | POA: Insufficient documentation

## 2016-07-19 DIAGNOSIS — Z885 Allergy status to narcotic agent status: Secondary | ICD-10-CM | POA: Diagnosis not present

## 2016-07-19 DIAGNOSIS — O99323 Drug use complicating pregnancy, third trimester: Secondary | ICD-10-CM | POA: Insufficient documentation

## 2016-07-19 NOTE — Progress Notes (Signed)
Pt was seen in Triage by Judeth HornErin Lawrence NP. D/C instructions for f/u given and pt d/c home from Triage

## 2016-07-19 NOTE — MAU Provider Note (Signed)
History     CSN: [id number]  Arrival date and time: 07/19/16 1135   First Provider Initiated Contact with Patient 07/19/16 1213      Chief Complaint  Patient presents with  . none    Here for pregnancy verification letter   HPI [name] is a 31 y.o. [id number] at 2016w5d who presents for pregnancy verification letter so she can get subutex. States the clinic she goes to requires verification. Patient has had no prenatal care thus far. Is scheduled for [place] prenatal [place] in [place] tomorrow but states she doesn't not have transportation to get there.  Denies abdominal pain, vaginal bleeding, or LOF. Positive fetal movement.   OB History    Gravida Para Term Preterm AB Living   5 2 1 1 2 2    SAB TAB Ectopic Multiple Live Births   2 0 0 0 2      Past Medical History:  Diagnosis Date  . Bartholin cyst   . Endometriosis   . Glaucoma   . Headache(784.0)   . History of PID   . Ovarian cyst   . STD (female)    hx of chlamydia and gonorrhea  . Substance abuse   . Vaginal Pap smear, abnormal    has not followed up    Past Surgical History:  Procedure Laterality Date  . DILATION AND CURETTAGE OF UTERUS    . FRACTURE SURGERY     left leg  . LAPAROSCOPY      Family History  Problem Relation Age of Onset  . Diabetes Mother   . Hypertension Maternal Grandmother   . Heart disease Maternal Grandmother     great grandma  . Anesthesia problems Neg Hx   . Hypotension Neg Hx   . Malignant hyperthermia Neg Hx   . Pseudochol deficiency Neg Hx   . Alcohol abuse Neg Hx     Social History  Substance Use Topics  . Smoking status: Current Every Day Smoker    Packs/day: 0.50    Years: 7.00    Types: Cigarettes  . Smokeless tobacco: Never Used  . Alcohol use No     Comment: Denies ETOH use    Allergies:  Allergies  Allergen Reactions  . Acyclovir And Related Swelling and Other (See Comments)    Reaction:  Facial/tongue swelling  . Darvocet [Propoxyphene  N-Acetaminophen] Hives  . Doxycycline Swelling and Other (See Comments)    Reaction:  Facial/tongue swelling  . Flexeril [Cyclobenzaprine] Nausea And Vomiting  . Metoclopramide Hives  . Naproxen Hives  . Latex Rash  . Tramadol Rash    Prescriptions Prior to Admission  Medication Sig Dispense Refill Last Dose  . buprenorphine (SUBUTEX) 8 MG SUBL SL tablet Place 8 mg under the tongue 2 (two) times daily.    05/20/2016 at Unknown time  . promethazine (PHENERGAN) 12.5 MG tablet Take 1 tablet (12.5 mg total) by mouth every 6 (six) hours as needed for nausea or vomiting. (Patient not taking: Reported on 05/21/2016) 30 tablet 0 Completed Course at Unknown time    Review of Systems  Gastrointestinal: Negative for abdominal pain.  Genitourinary: Negative for vaginal bleeding and vaginal discharge.   Physical Exam   Blood pressure 108/66, pulse 96, temperature 98.2 F (36.8 C), resp. rate 18, height 5\' 4"  (1.626 m), weight 151 lb (68.5 kg), last menstrual period 12/31/2015.  Physical Exam  Nursing note and vitals reviewed. Constitutional: She is oriented to person, place, and time. She appears well-developed and  well-nourished. No distress.  HENT:  Head: Normocephalic and atraumatic.  Eyes: Conjunctivae are normal. Right eye exhibits no discharge. Left eye exhibits no discharge. No scleral icterus.  Neck: Normal range of motion.  Respiratory: Effort normal. No respiratory distress.  Neurological: She is alert and oriented to person, place, and time.  Skin: Skin is warm and dry. She is not diaphoretic.  Psychiatric: She has a normal mood and affect. Her behavior is normal. Judgment and thought content normal.    MAU Course  Procedures  MDM FHT 144 by doppler VSS  Assessment and Plan  A: 1. Fetal heart tones present, third trimester    P: Discharge home Pregnancy verification letter provided Msg to [facility] for prenatal care ASAP Discussed reasons to return to MAU & stressed  importance of keeping prenatal appointments  Judeth Horn 07/19/2016, 12:13 PM

## 2016-07-19 NOTE — MAU Note (Signed)
Pt presents to MAU stating that she needs verification letter for pregnancy so that she can get her medication subutex. Denies any VB, LOF or pain at this time.

## 2016-07-19 NOTE — Telephone Encounter (Signed)
Called patient to give her the appointment information. Voicemail picked up, and message was left to call [phone] to get information about appointment.

## 2016-07-19 NOTE — Discharge Instructions (Signed)
Third Trimester of Pregnancy The third trimester is from week 28 through week 40 (months 7 through 9). The third trimester is a time when the unborn baby (fetus) is growing rapidly. At the end of the ninth month, the fetus is about 20 inches in length and weighs 6-10 pounds. Body changes during your third trimester Your body will continue to go through many changes during pregnancy. The changes vary from woman to woman. During the third trimester:  Your weight will continue to increase. You can expect to gain 25-35 pounds (11-16 kg) by the end of the pregnancy.  You may begin to get stretch marks on your hips, abdomen, and breasts.  You may urinate more often because the fetus is moving lower into your pelvis and pressing on your bladder.  You may develop or continue to have heartburn. This is caused by increased hormones that slow down muscles in the digestive tract.  You may develop or continue to have constipation because increased hormones slow digestion and cause the muscles that push waste through your intestines to relax.  You may develop hemorrhoids. These are swollen veins (varicose veins) in the rectum that can itch or be painful.  You may develop swollen, bulging veins (varicose veins) in your legs.  You may have increased body aches in the pelvis, back, or thighs. This is due to weight gain and increased hormones that are relaxing your joints.  You may have changes in your hair. These can include thickening of your hair, rapid growth, and changes in texture. Some women also have hair loss during or after pregnancy, or hair that feels dry or thin. Your hair will most likely return to normal after your baby is born.  Your breasts will continue to grow and they will continue to become tender. A yellow fluid (colostrum) may leak from your breasts. This is the first milk you are producing for your baby.  Your belly button may stick out.  You may notice more swelling in your hands,  face, or ankles.  You may have increased tingling or numbness in your hands, arms, and legs. The skin on your belly may also feel numb.  You may feel short of breath because of your expanding uterus.  You may have more problems sleeping. This can be caused by the size of your belly, increased need to urinate, and an increase in your body's metabolism.  You may notice the fetus "dropping," or moving lower in your abdomen (lightening).  You may have increased vaginal discharge.  You may notice your joints feel loose and you may have pain around your pelvic bone.  What to expect at prenatal visits You will have prenatal exams every 2 weeks until week 36. Then you will have weekly prenatal exams. During a routine prenatal visit:  You will be weighed to make sure you and the baby are growing normally.  Your blood pressure will be taken.  Your abdomen will be measured to track your baby's growth.  The fetal heartbeat will be listened to.  Any test results from the previous visit will be discussed.  You may have a cervical check near your due date to see if your cervix has softened or thinned (effaced).  You will be tested for Group B streptococcus. This happens between 35 and 37 weeks.  Your health care provider may ask you:  What your birth plan is.  How you are feeling.  If you are feeling the baby move.  If you have had   any abnormal symptoms, such as leaking fluid, bleeding, severe headaches, or abdominal cramping.  If you are using any tobacco products, including cigarettes, chewing tobacco, and electronic cigarettes.  If you have any questions.  Other tests or screenings that may be performed during your third trimester include:  Blood tests that check for low iron levels (anemia).  Fetal testing to check the health, activity level, and growth of the fetus. Testing is done if you have certain medical conditions or if there are problems during the  pregnancy.  Nonstress test (NST). This test checks the health of your baby to make sure there are no signs of problems, such as the baby not getting enough oxygen. During this test, a belt is placed around your belly. The baby is made to move, and its heart rate is monitored during movement.  What is false labor? False labor is a condition in which you feel small, irregular tightenings of the muscles in the womb (contractions) that usually go away with rest, changing position, or drinking water. These are called Braxton Hicks contractions. Contractions may last for hours, days, or even weeks before true labor sets in. If contractions come at regular intervals, become more frequent, increase in intensity, or become painful, you should see your health care provider. What are the signs of labor?  Abdominal cramps.  Regular contractions that start at 10 minutes apart and become stronger and more frequent with time.  Contractions that start on the top of the uterus and spread down to the lower abdomen and back.  Increased pelvic pressure and dull back pain.  A watery or bloody mucus discharge that comes from the vagina.  Leaking of amniotic fluid. This is also known as your "water breaking." It could be a slow trickle or a gush. Let your health care provider know if it has a color or strange odor. If you have any of these signs, call your health care provider right away, even if it is before your due date. Follow these instructions at home: Medicines  Follow your health care provider's instructions regarding medicine use. Specific medicines may be either safe or unsafe to take during pregnancy.  Take a prenatal vitamin that contains at least 600 micrograms (mcg) of folic acid.  If you develop constipation, try taking a stool softener if your health care provider approves. Eating and drinking  Eat a balanced diet that includes fresh fruits and vegetables, whole grains, good sources of protein  such as meat, eggs, or tofu, and low-fat dairy. Your health care provider will help you determine the amount of weight gain that is right for you.  Avoid raw meat and uncooked cheese. These carry germs that can cause birth defects in the baby.  If you have low calcium intake from food, talk to your health care provider about whether you should take a daily calcium supplement.  Eat four or five small meals rather than three large meals a day.  Limit foods that are high in fat and processed sugars, such as fried and sweet foods.  To prevent constipation: ? Drink enough fluid to keep your urine clear or pale yellow. ? Eat foods that are high in fiber, such as fresh fruits and vegetables, whole grains, and beans. Activity  Exercise only as directed by your health care provider. Most women can continue their usual exercise routine during pregnancy. Try to exercise for 30 minutes at least 5 days a week. Stop exercising if you experience uterine contractions.  Avoid heavy   lifting.  Do not exercise in extreme heat or humidity, or at high altitudes.  Wear low-heel, comfortable shoes.  Practice good posture.  You may continue to have sex unless your health care provider tells you otherwise. Relieving pain and discomfort  Take frequent breaks and rest with your legs elevated if you have leg cramps or low back pain.  Take warm sitz baths to soothe any pain or discomfort caused by hemorrhoids. Use hemorrhoid cream if your health care provider approves.  Wear a good support bra to prevent discomfort from breast tenderness.  If you develop varicose veins: ? Wear support pantyhose or compression stockings as told by your healthcare provider. ? Elevate your feet for 15 minutes, 3-4 times a day. Prenatal care  Write down your questions. Take them to your prenatal visits.  Keep all your prenatal visits as told by your health care provider. This is important. Safety  Wear your seat belt at  all times when driving.  Make a list of emergency phone numbers, including numbers for family, friends, the hospital, and police and fire departments. General instructions  Avoid cat litter boxes and soil used by cats. These carry germs that can cause birth defects in the baby. If you have a cat, ask someone to clean the litter box for you.  Do not travel far distances unless it is absolutely necessary and only with the approval of your health care provider.  Do not use hot tubs, steam rooms, or saunas.  Do not drink alcohol.  Do not use any products that contain nicotine or tobacco, such as cigarettes and e-cigarettes. If you need help quitting, ask your health care provider.  Do not use any medicinal herbs or unprescribed drugs. These chemicals affect the formation and growth of the baby.  Do not douche or use tampons or scented sanitary pads.  Do not cross your legs for long periods of time.  To prepare for the arrival of your baby: ? Take prenatal classes to understand, practice, and ask questions about labor and delivery. ? Make a trial run to the hospital. ? Visit the hospital and tour the maternity area. ? Arrange for maternity or paternity leave through employers. ? Arrange for family and friends to take care of pets while you are in the hospital. ? Purchase a rear-facing car seat and make sure you know how to install it in your car. ? Pack your hospital bag. ? Prepare the baby's nursery. Make sure to remove all pillows and stuffed animals from the baby's crib to prevent suffocation.  Visit your dentist if you have not gone during your pregnancy. Use a soft toothbrush to brush your teeth and be gentle when you floss. Contact a health care provider if:  You are unsure if you are in labor or if your water has broken.  You become dizzy.  You have mild pelvic cramps, pelvic pressure, or nagging pain in your abdominal area.  You have lower back pain.  You have persistent  nausea, vomiting, or diarrhea.  You have an unusual or bad smelling vaginal discharge.  You have pain when you urinate. Get help right away if:  Your water breaks before 37 weeks.  You have regular contractions less than 5 minutes apart before 37 weeks.  You have a fever.  You are leaking fluid from your vagina.  You have spotting or bleeding from your vagina.  You have severe abdominal pain or cramping.  You have rapid weight loss or weight gain.    You have shortness of breath with chest pain.  You notice sudden or extreme swelling of your face, hands, ankles, feet, or legs.  Your baby makes fewer than 10 movements in 2 hours.  You have severe headaches that do not go away when you take medicine.  You have vision changes. Summary  The third trimester is from week 28 through week 40, months 7 through 9. The third trimester is a time when the unborn baby (fetus) is growing rapidly.  During the third trimester, your discomfort may increase as you and your baby continue to gain weight. You may have abdominal, leg, and back pain, sleeping problems, and an increased need to urinate.  During the third trimester your breasts will keep growing and they will continue to become tender. A yellow fluid (colostrum) may leak from your breasts. This is the first milk you are producing for your baby.  False labor is a condition in which you feel small, irregular tightenings of the muscles in the womb (contractions) that eventually go away. These are called Braxton Hicks contractions. Contractions may last for hours, days, or even weeks before true labor sets in.  Signs of labor can include: abdominal cramps; regular contractions that start at 10 minutes apart and become stronger and more frequent with time; watery or bloody mucus discharge that comes from the vagina; increased pelvic pressure and dull back pain; and leaking of amniotic fluid. This information is not intended to replace advice  given to you by your health care provider. Make sure you discuss any questions you have with your health care provider. Document Released: 04/05/2001 Document Revised: 09/17/2015 Document Reviewed: 06/12/2012 Elsevier Interactive Patient Education  2017 [employer]  

## 2016-08-04 ENCOUNTER — Telehealth: Payer: Self-pay | Admitting: Family Medicine

## 2016-08-04 ENCOUNTER — Encounter: Payer: Self-pay | Admitting: Family Medicine

## 2016-08-04 NOTE — Telephone Encounter (Signed)
Called patient due to missed new ob appointment. Left message for patient to return call asap.

## 2016-08-22 ENCOUNTER — Encounter: Payer: Self-pay | Admitting: Obstetrics and Gynecology

## 2016-09-09 ENCOUNTER — Emergency Department (HOSPITAL_COMMUNITY): Payer: Medicaid Other

## 2016-09-09 ENCOUNTER — Emergency Department (HOSPITAL_COMMUNITY)
Admission: EM | Admit: 2016-09-09 | Discharge: 2016-09-09 | Disposition: A | Discharge status: Other Acute Inpatient Hospital | Payer: Medicaid Other | Attending: Emergency Medicine | Admitting: Emergency Medicine

## 2016-09-09 DIAGNOSIS — R930 Abnormal findings on diagnostic imaging of skull and head, not elsewhere classified: Secondary | ICD-10-CM | POA: Insufficient documentation

## 2016-09-09 DIAGNOSIS — O9A213 Injury, poisoning and certain other consequences of external causes complicating pregnancy, third trimester: Secondary | ICD-10-CM | POA: Diagnosis not present

## 2016-09-09 DIAGNOSIS — Y939 Activity, unspecified: Secondary | ICD-10-CM | POA: Diagnosis not present

## 2016-09-09 DIAGNOSIS — Y929 Unspecified place or not applicable: Secondary | ICD-10-CM | POA: Insufficient documentation

## 2016-09-09 DIAGNOSIS — R8271 Bacteriuria: Secondary | ICD-10-CM

## 2016-09-09 DIAGNOSIS — Y999 Unspecified external cause status: Secondary | ICD-10-CM | POA: Diagnosis not present

## 2016-09-09 DIAGNOSIS — Z3A38 38 weeks gestation of pregnancy: Secondary | ICD-10-CM | POA: Diagnosis not present

## 2016-09-09 DIAGNOSIS — Z91040 Latex allergy status: Secondary | ICD-10-CM | POA: Diagnosis not present

## 2016-09-09 DIAGNOSIS — F17210 Nicotine dependence, cigarettes, uncomplicated: Secondary | ICD-10-CM | POA: Insufficient documentation

## 2016-09-09 DIAGNOSIS — O0993 Supervision of high risk pregnancy, unspecified, third trimester: Secondary | ICD-10-CM

## 2016-09-09 MED ORDER — ACETAMINOPHEN 325 MG PO TABS
650.0000 mg | ORAL_TABLET | Freq: Once | ORAL | Status: DC
  Filled 2016-09-09: qty 2

## 2016-09-09 NOTE — ED Notes (Signed)
Pt states she can not use the restroom at this time. Refusing in & out cath. PT given a heating pack for head per request.

## 2016-09-09 NOTE — ED Notes (Signed)
MD at bedside to explain [facility] and having [facility] baby [facility] out and risks of signing out of the hospital which could include death. Pt still refusing to be transferred to [facility] or to stay in ED.

## 2016-09-09 NOTE — ED Notes (Signed)
Pt signed AMA e signature acknowledging risks of leaving the ED without being discharged or without complete workup to clear herself and unborn child.
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
## 2016-09-09 NOTE — ED Notes (Signed)
Patient transported to CT 

## 2016-09-09 NOTE — Progress Notes (Signed)
Pt refusing to be transferred to MAU, pt leaving ED AMA, informed of risks of leaving. Dr Erin FullingHarraway Smith informed of pt leaving AMA.
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
## 2016-09-09 NOTE — ED Notes (Addendum)
Rapid OB at bedside. Toga monitor applied
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
## 2016-09-12 ENCOUNTER — Inpatient Hospital Stay (HOSPITAL_COMMUNITY)
Admission: AD | Admit: 2016-09-12 | Discharge: 2016-09-12 | Disposition: A | Discharge status: Home / Self Care | Payer: Medicaid Other | Source: Ambulatory Visit | Attending: Obstetrics & Gynecology | Admitting: Obstetrics & Gynecology

## 2016-09-12 ENCOUNTER — Encounter (HOSPITAL_COMMUNITY): Payer: Self-pay

## 2016-09-12 DIAGNOSIS — H409 Unspecified glaucoma: Secondary | ICD-10-CM | POA: Diagnosis not present

## 2016-09-12 DIAGNOSIS — O26893 Other specified pregnancy related conditions, third trimester: Secondary | ICD-10-CM | POA: Insufficient documentation

## 2016-09-12 DIAGNOSIS — B3731 Acute candidiasis of vulva and vagina: Secondary | ICD-10-CM

## 2016-09-12 DIAGNOSIS — F1910 Other psychoactive substance abuse, uncomplicated: Secondary | ICD-10-CM

## 2016-09-12 DIAGNOSIS — O219 Vomiting of pregnancy, unspecified: Secondary | ICD-10-CM

## 2016-09-12 DIAGNOSIS — O212 Late vomiting of pregnancy: Secondary | ICD-10-CM | POA: Insufficient documentation

## 2016-09-12 DIAGNOSIS — N898 Other specified noninflammatory disorders of vagina: Secondary | ICD-10-CM | POA: Insufficient documentation

## 2016-09-12 DIAGNOSIS — O99333 Smoking (tobacco) complicating pregnancy, third trimester: Secondary | ICD-10-CM | POA: Insufficient documentation

## 2016-09-12 DIAGNOSIS — G43909 Migraine, unspecified, not intractable, without status migrainosus: Secondary | ICD-10-CM | POA: Insufficient documentation

## 2016-09-12 DIAGNOSIS — O0933 Supervision of pregnancy with insufficient antenatal care, third trimester: Secondary | ICD-10-CM

## 2016-09-12 DIAGNOSIS — R8271 Bacteriuria: Secondary | ICD-10-CM

## 2016-09-12 DIAGNOSIS — M549 Dorsalgia, unspecified: Secondary | ICD-10-CM | POA: Diagnosis present

## 2016-09-12 DIAGNOSIS — R102 Pelvic and perineal pain: Secondary | ICD-10-CM | POA: Diagnosis present

## 2016-09-12 DIAGNOSIS — T7491XA Unspecified adult maltreatment, confirmed, initial encounter: Secondary | ICD-10-CM

## 2016-09-12 DIAGNOSIS — B373 Candidiasis of vulva and vagina: Secondary | ICD-10-CM

## 2016-09-12 DIAGNOSIS — Z3A36 36 weeks gestation of pregnancy: Secondary | ICD-10-CM | POA: Diagnosis not present

## 2016-09-12 DIAGNOSIS — O9989 Other specified diseases and conditions complicating pregnancy, childbirth and the puerperium: Secondary | ICD-10-CM | POA: Diagnosis not present

## 2016-09-12 LAB — DIFFERENTIAL
Basophils Absolute: 0 10*3/uL (ref 0.0–0.1)
Basophils Relative: 0 %
EOS PCT: 1 %
Eosinophils Absolute: 0 10*3/uL (ref 0.0–0.7)
LYMPHS ABS: 1.7 10*3/uL (ref 0.7–4.0)
LYMPHS PCT: 27 %
MONO ABS: 0.3 10*3/uL (ref 0.1–1.0)
MONOS PCT: 5 %
NEUTROS ABS: 4.2 10*3/uL (ref 1.7–7.7)
Neutrophils Relative %: 67 %

## 2016-09-12 LAB — URINALYSIS, ROUTINE W REFLEX MICROSCOPIC
Bilirubin Urine: NEGATIVE
Glucose, UA: NEGATIVE mg/dL
Hgb urine dipstick: NEGATIVE
KETONES UR: NEGATIVE mg/dL
Leukocytes, UA: NEGATIVE
Nitrite: NEGATIVE
PH: 7 (ref 5.0–8.0)
Protein, ur: NEGATIVE mg/dL
SPECIFIC GRAVITY, URINE: 1.01 (ref 1.005–1.030)

## 2016-09-12 LAB — RAPID URINE DRUG SCREEN, HOSP PERFORMED
Amphetamines: NOT DETECTED
BENZODIAZEPINES: NOT DETECTED
Barbiturates: NOT DETECTED
Cocaine: NOT DETECTED
Opiates: NOT DETECTED
Tetrahydrocannabinol: POSITIVE — AB

## 2016-09-12 LAB — CBC
HEMATOCRIT: 31.8 % — AB (ref 36.0–46.0)
HEMOGLOBIN: 11 g/dL — AB (ref 12.0–15.0)
MCH: 30.8 pg (ref 26.0–34.0)
MCHC: 34.6 g/dL (ref 30.0–36.0)
MCV: 89.1 fL (ref 78.0–100.0)
Platelets: 216 10*3/uL (ref 150–400)
RBC: 3.57 MIL/uL — AB (ref 3.87–5.11)
RDW: 15.7 % — ABNORMAL HIGH (ref 11.5–15.5)
WBC: 6.3 10*3/uL (ref 4.0–10.5)

## 2016-09-12 LAB — ABO/RH: ABO/RH(D): B POS

## 2016-09-12 LAB — WET PREP, GENITAL
Clue Cells Wet Prep HPF POC: NONE SEEN
Sperm: NONE SEEN
Trich, Wet Prep: NONE SEEN

## 2016-09-12 LAB — TYPE AND SCREEN
ABO/RH(D): B POS
ANTIBODY SCREEN: NEGATIVE

## 2016-09-12 LAB — OB RESULTS CONSOLE GC/CHLAMYDIA: Gonorrhea: NEGATIVE

## 2016-09-12 LAB — AMNISURE RUPTURE OF MEMBRANE (ROM) NOT AT ARMC: Amnisure ROM: NEGATIVE

## 2016-09-12 LAB — HEPATITIS B SURFACE ANTIGEN: Hepatitis B Surface Ag: NEGATIVE

## 2016-09-12 LAB — POCT FERN TEST: POCT FERN TEST: NEGATIVE

## 2016-09-12 MED ORDER — PROMETHAZINE HCL 25 MG PO TABS: 25.0000 mg | ORAL_TABLET | Freq: Four times a day (QID) | ORAL | 0 refills | Status: DC | PRN

## 2016-09-12 MED ORDER — PROMETHAZINE HCL 25 MG PO TABS
25.0000 mg | ORAL_TABLET | Freq: Once | ORAL | Status: AC
  Administered 2016-09-12: 25 mg via ORAL
  Filled 2016-09-12: qty 1

## 2016-09-12 MED ORDER — BUTALBITAL-APAP-CAFFEINE 50-325-40 MG PO TABS
2.0000 | ORAL_TABLET | Freq: Once | ORAL | Status: AC
  Administered 2016-09-12: 2 via ORAL
  Filled 2016-09-12: qty 2

## 2016-09-12 MED ORDER — BUTALBITAL-APAP-CAFFEINE 50-325-40 MG PO TABS: 1.0000 | ORAL_TABLET | Freq: Two times a day (BID) | ORAL | 0 refills | Status: DC | PRN

## 2016-09-12 MED ORDER — TERCONAZOLE 0.4 % VA CREA: 1.0000 | TOPICAL_CREAM | Freq: Every day | VAGINAL | 0 refills | Status: DC

## 2016-09-12 NOTE — MAU Note (Signed)
Pt states she has had lower back pain and pelvic pain on her L side. She feels like a migraine is starting now and reports leakage of light pink fluid that began around 2 pm. Pt says fluid continues to leak. Reports good fetal movement. Pt states she is going through withdrawals from not being able to get subutex Rx filled. Last dose yesterday morning.

## 2016-09-12 NOTE — Discharge Instructions (Signed)
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
## 2016-09-13 LAB — GC/CHLAMYDIA PROBE AMP (~~LOC~~) NOT AT ARMC
Chlamydia: NEGATIVE
Neisseria Gonorrhea: NEGATIVE

## 2016-09-13 LAB — HIV ANTIBODY (ROUTINE TESTING W REFLEX): HIV Screen 4th Generation wRfx: NONREACTIVE

## 2016-09-13 LAB — RUBELLA SCREEN: RUBELLA: 6.58 {index} (ref >0.99)

## 2016-09-13 LAB — RPR: RPR: NONREACTIVE

## 2016-09-14 ENCOUNTER — Telehealth: Payer: Self-pay | Admitting: Advanced Practice Midwife

## 2016-09-14 ENCOUNTER — Encounter: Payer: Self-pay | Admitting: Advanced Practice Midwife

## 2016-09-14 NOTE — Telephone Encounter (Signed)
Called patient about her missed appointment. No answer. Sending certified letter.

## 2016-09-27 ENCOUNTER — Encounter (HOSPITAL_COMMUNITY): Payer: Self-pay | Admitting: *Deleted

## 2016-09-27 ENCOUNTER — Inpatient Hospital Stay (HOSPITAL_COMMUNITY)
Admission: AD | Admit: 2016-09-27 | Discharge: 2016-09-30 | Disposition: A | Discharge status: Other Acute Inpatient Hospital | Payer: Medicaid Other | Source: Ambulatory Visit | Attending: Family Medicine | Admitting: Family Medicine

## 2016-09-27 DIAGNOSIS — O99353 Diseases of the nervous system complicating pregnancy, third trimester: Secondary | ICD-10-CM | POA: Insufficient documentation

## 2016-09-27 DIAGNOSIS — O9989 Other specified diseases and conditions complicating pregnancy, childbirth and the puerperium: Secondary | ICD-10-CM | POA: Diagnosis not present

## 2016-09-27 DIAGNOSIS — Z833 Family history of diabetes mellitus: Secondary | ICD-10-CM | POA: Insufficient documentation

## 2016-09-27 DIAGNOSIS — F1120 Opioid dependence, uncomplicated: Secondary | ICD-10-CM | POA: Diagnosis not present

## 2016-09-27 DIAGNOSIS — F1414 Cocaine abuse with cocaine-induced mood disorder: Secondary | ICD-10-CM | POA: Diagnosis present

## 2016-09-27 DIAGNOSIS — F4310 Post-traumatic stress disorder, unspecified: Secondary | ICD-10-CM | POA: Insufficient documentation

## 2016-09-27 DIAGNOSIS — Z8249 Family history of ischemic heart disease and other diseases of the circulatory system: Secondary | ICD-10-CM | POA: Insufficient documentation

## 2016-09-27 DIAGNOSIS — G43909 Migraine, unspecified, not intractable, without status migrainosus: Secondary | ICD-10-CM | POA: Diagnosis not present

## 2016-09-27 DIAGNOSIS — O219 Vomiting of pregnancy, unspecified: Secondary | ICD-10-CM | POA: Insufficient documentation

## 2016-09-27 DIAGNOSIS — O99333 Smoking (tobacco) complicating pregnancy, third trimester: Secondary | ICD-10-CM | POA: Insufficient documentation

## 2016-09-27 DIAGNOSIS — F11220 Opioid dependence with intoxication, uncomplicated: Secondary | ICD-10-CM | POA: Diagnosis not present

## 2016-09-27 DIAGNOSIS — F17210 Nicotine dependence, cigarettes, uncomplicated: Secondary | ICD-10-CM | POA: Insufficient documentation

## 2016-09-27 DIAGNOSIS — R45851 Suicidal ideations: Secondary | ICD-10-CM | POA: Diagnosis not present

## 2016-09-27 DIAGNOSIS — F1220 Cannabis dependence, uncomplicated: Secondary | ICD-10-CM | POA: Diagnosis not present

## 2016-09-27 DIAGNOSIS — O99513 Diseases of the respiratory system complicating pregnancy, third trimester: Secondary | ICD-10-CM | POA: Diagnosis not present

## 2016-09-27 DIAGNOSIS — O99343 Other mental disorders complicating pregnancy, third trimester: Secondary | ICD-10-CM | POA: Insufficient documentation

## 2016-09-27 DIAGNOSIS — D573 Sickle-cell trait: Secondary | ICD-10-CM | POA: Insufficient documentation

## 2016-09-27 DIAGNOSIS — O99283 Endocrine, nutritional and metabolic diseases complicating pregnancy, third trimester: Secondary | ICD-10-CM | POA: Insufficient documentation

## 2016-09-27 DIAGNOSIS — Z3A38 38 weeks gestation of pregnancy: Secondary | ICD-10-CM | POA: Insufficient documentation

## 2016-09-27 DIAGNOSIS — F333 Major depressive disorder, recurrent, severe with psychotic symptoms: Secondary | ICD-10-CM | POA: Insufficient documentation

## 2016-09-27 DIAGNOSIS — O99323 Drug use complicating pregnancy, third trimester: Secondary | ICD-10-CM | POA: Insufficient documentation

## 2016-09-27 DIAGNOSIS — O3483 Maternal care for other abnormalities of pelvic organs, third trimester: Secondary | ICD-10-CM | POA: Insufficient documentation

## 2016-09-27 DIAGNOSIS — H409 Unspecified glaucoma: Secondary | ICD-10-CM | POA: Insufficient documentation

## 2016-09-27 DIAGNOSIS — F332 Major depressive disorder, recurrent severe without psychotic features: Secondary | ICD-10-CM | POA: Diagnosis not present

## 2016-09-27 DIAGNOSIS — Z590 Homelessness: Secondary | ICD-10-CM | POA: Insufficient documentation

## 2016-09-27 DIAGNOSIS — Z046 Encounter for general psychiatric examination, requested by authority: Secondary | ICD-10-CM

## 2016-09-27 DIAGNOSIS — F1424 Cocaine dependence with cocaine-induced mood disorder: Secondary | ICD-10-CM | POA: Diagnosis not present

## 2016-09-27 LAB — GC/CHLAMYDIA PROBE AMP (~~LOC~~) NOT AT ARMC
CHLAMYDIA, DNA PROBE: NEGATIVE
Neisseria Gonorrhea: NEGATIVE

## 2016-09-27 LAB — RAPID URINE DRUG SCREEN, HOSP PERFORMED
AMPHETAMINES: NOT DETECTED
BARBITURATES: POSITIVE — AB
BENZODIAZEPINES: NOT DETECTED
Cocaine: POSITIVE — AB
Opiates: POSITIVE — AB
TETRAHYDROCANNABINOL: POSITIVE — AB

## 2016-09-27 LAB — URINALYSIS, ROUTINE W REFLEX MICROSCOPIC
BACTERIA UA: NONE SEEN
Bilirubin Urine: NEGATIVE
Glucose, UA: >=500 mg/dL — AB
HGB URINE DIPSTICK: NEGATIVE
Ketones, ur: NEGATIVE mg/dL
Leukocytes, UA: NEGATIVE
Nitrite: NEGATIVE
PH: 5 (ref 5.0–8.0)
Protein, ur: NEGATIVE mg/dL
SPECIFIC GRAVITY, URINE: 1.015 (ref 1.005–1.030)

## 2016-09-27 LAB — WET PREP, GENITAL
SPERM: NONE SEEN
TRICH WET PREP: NONE SEEN
Yeast Wet Prep HPF POC: NONE SEEN

## 2016-09-27 LAB — POCT FERN TEST: POCT Fern Test: NEGATIVE

## 2016-09-27 MED ORDER — DIPHENHYDRAMINE HCL 50 MG/ML IJ SOLN
25.0000 mg | Freq: Once | INTRAMUSCULAR | Status: AC
  Administered 2016-09-27: 25 mg via INTRAVENOUS
  Filled 2016-09-27: qty 1

## 2016-09-27 MED ORDER — BUTALBITAL-APAP-CAFFEINE 50-325-40 MG PO TABS
1.0000 | ORAL_TABLET | Freq: Once | ORAL | Status: AC
  Administered 2016-09-27: 1 via ORAL
  Filled 2016-09-27: qty 1

## 2016-09-27 MED ORDER — DEXAMETHASONE SODIUM PHOSPHATE 10 MG/ML IJ SOLN
10.0000 mg | Freq: Once | INTRAMUSCULAR | Status: AC
Start: 2016-09-27 — End: 2016-09-27
  Administered 2016-09-27: 10 mg via INTRAVENOUS
  Filled 2016-09-27: qty 1

## 2016-09-27 MED ORDER — LACTATED RINGERS IV BOLUS (SEPSIS)
1000.0000 mL | Freq: Once | INTRAVENOUS | Status: AC
Start: 2016-09-27 — End: 2016-09-27
  Administered 2016-09-27: 1000 mL via INTRAVENOUS

## 2016-09-27 MED ORDER — BUPRENORPHINE HCL 8 MG SL SUBL
8.0000 mg | SUBLINGUAL_TABLET | Freq: Two times a day (BID) | SUBLINGUAL | Status: DC
  Administered 2016-09-27 – 2016-09-30 (×6): 8 mg via SUBLINGUAL
  Filled 2016-09-27 (×10): qty 1

## 2016-09-27 MED ORDER — PROMETHAZINE HCL 25 MG/ML IJ SOLN
25.0000 mg | Freq: Once | INTRAMUSCULAR | Status: AC
  Administered 2016-09-27: 25 mg via INTRAVENOUS
  Filled 2016-09-27: qty 1

## 2016-09-27 MED ORDER — PROMETHAZINE HCL 25 MG/ML IJ SOLN
12.5000 mg | Freq: Once | INTRAMUSCULAR | Status: AC
  Administered 2016-09-27: 12.5 mg via INTRAMUSCULAR
  Filled 2016-09-27: qty 1

## 2016-09-27 MED ORDER — DEXAMETHASONE SODIUM PHOSPHATE 10 MG/ML IJ SOLN
10.0000 mg | Freq: Once | INTRAMUSCULAR | Status: AC
  Administered 2016-09-27: 10 mg via INTRAVENOUS
  Filled 2016-09-27: qty 1

## 2016-09-27 MED ORDER — ACETAMINOPHEN 500 MG PO TABS
1000.0000 mg | ORAL_TABLET | Freq: Once | ORAL | Status: AC
  Administered 2016-09-27: 1000 mg via ORAL
  Filled 2016-09-27: qty 2

## 2016-09-27 NOTE — Progress Notes (Signed)
CSW was made aware in the late afternoon, that Dr. Adrian BlackwaterStinson had concerns about managing the psychiatric care of this patient wanted her moved to a psychiatric unit as soon as possible..  Earlier, Sherre PootKristin C, LCSW, TTS clinician, spoke with Dr. Adrian BlackwaterStinson and explained that she would fax a referral out to [facility] as that was the most likely treatment option for this pt.    [facility] has relayed both to Taylor LandingKristin c and to this Clinical research associatewriter that their doctor would review the patient's chart tomorrow morning to determine if they can accept her.  [facility], they have told us there will not be a bed available until Thursday.  CSW was asked to contact Dr. Adrian BlackwaterStinson regarding this patient's placement.  CSW explained the limit on psychiatric bed availability for pregnant women, particularly those who are in the advanced stages of gestation. Dr. Adrian BlackwaterStinson was interested in the patient going to other [facility] facilities.  This Clinical research associatewriter explained that this was very unlikely but suggested that Dr. Adrian BlackwaterStinson contact the Press photographerCharge Nurse at [facility].  Dr. Adrian BlackwaterStinson wanted to discuss with the [facility] EDP .  CSW gave Dr. Adrian BlackwaterStinson the number to call.  @20 :55 CSW received request to [facility] in order to talk to the patient.  CSW called and spoke to Press photographerCharge Nurse, who described the patient as "upset, not listening, walking in and out of her room and going outside to smoke cigarettes."  Charge Nurse also said patient was objecting to having a sitter and asked this Clinical research associatewriter to talk to her.  As the pt was already agitated and did not know this Clinical research associatewriter, CSW felt that would not be a useful intervention.  Charge Nurse asked CSW to speak to Dr. Adrian BlackwaterStinson again.  Dr Adrian BlackwaterStinson asked who was responsible for the patient, as the TTS team had recommended in-patient treatment.  CSW responded that (to the best of this writer's knowledge) as long as the patient was in their care, [facility] was  responsible for the patient.  Dr. Adrian BlackwaterStinson expressed frustration and concern stating that he "did not know how to keep this patient safe."  CSW explained again the difficulty of placing a pregnant patient, particularly one who was at [redacted] weeks pregnant.    CSW suggested that a request be put in for a psychiatric consult for tomorrow.  Dr. Adrian BlackwaterStinson requested that be done.    CSW entered a request in the MD consult log asking that a psychiatrist contact see the patient on 09/28/16.  Timmothy EulerJean T. Kaylyn LimSutter, MSW, LCSWA Clinical Social Work Disposition [phone]

## 2016-09-27 NOTE — BHH Counselor (Signed)
TTS faxed pt information out [facility]. Spoke to McCordLaurie in admissions who stated that she will have the doctor review. She states that there should be a discharge on Thursday and if pt meets criteria could be considered to come then.   [address] S[address] Nichols StreetKristin Rahima Fleishman, LPC, LCAS
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
## 2016-09-27 NOTE — Progress Notes (Signed)
Pt declined speaking at this time and stated that she had a headache.

## 2016-09-27 NOTE — MAU Provider Note (Signed)
Progress Note for Holding Patient  S:  Feels a little better after Benadryl and Decadron       Wants something to eat        Normal fetal movement        Occasional mild contractions       States takes Subutex 8mg  [facility]  O:   Vitals:   09/27/16 0151 09/27/16 0741 09/27/16 1032 09/27/16 1519  BP: 113/69 128/70 120/60 122/81  Pulse: 74 70 76 (!) 58  Resp: 18 20 16 18   Temp: 98.2 F (36.8 C)   98.1 F (36.7 C)  TempSrc: Oral   Oral  Weight:      Height:              Resting comfortably       Calm demeanor now.     A;  Single IUP at 5678w5d       Narcotic abuse, potential for withdrawal       Suicidal ideations with plan  P:   Continue to observe        Will order Subutex bid (consulted Pharmacy)         NST q day        Antenatal regular diet        Psych will round on her tomorrow per Dr Adrian BlackwaterStinson
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
## 2016-09-27 NOTE — Progress Notes (Signed)
CSW met with patient to complete an assessment for homelessness.  When CSW arrived, patient was resting in bed and communicated a serve headache.  Patient asked CSW to leave and return when patient had time for pain medication to react.  CSW left the room but immediately returned after CSW was informed that patient will need to be d/c soon.  Patient was polite, tearful, and receptive to meeting with CSW. When CSW returned, patient was vague with responses to CSW questions and reported that shelter was not patient's priority.  Patient stated that patient is an established patient at [facility], and they provide her with Subutex Rx. Patient reported having a scheduled appointment on Thursday, September 29, 2016.  CSW was able to call the urgent care clinic to confirm appointment day and time for patient.  CSW also requested a 2 day Rx for patient's Subutex and the clinic negated. However, the clinic did inform CSW that patient could come in for a walk-in on today to been seen by physician and  receive a new Rx. CSW informed patient of patient's options and patient communicated that patient did not have transportation to [place] at this time.  CSW attempted to process with patient options for transportation and every option and suggestion was declined by patient.  Patient became tearful and informed CSW that patient's only option was to go to [facility] in effort to receive Subutex treatment.  CSW assessed patient for SI and HI and patient informed CSW that patient is having SI/HI and has a plan.  Patient stated " I will go walk into traffic" (patient's bedside nurse was also present when patient communicated SI/HI). CSW informed CNM and request a telepsych consult with Behavioral Health.   Laurey Arrow, MSW, LCSW Clinical Social Work [phone]

## 2016-09-28 DIAGNOSIS — F332 Major depressive disorder, recurrent severe without psychotic features: Secondary | ICD-10-CM

## 2016-09-28 DIAGNOSIS — F11220 Opioid dependence with intoxication, uncomplicated: Secondary | ICD-10-CM | POA: Diagnosis not present

## 2016-09-28 DIAGNOSIS — F1414 Cocaine abuse with cocaine-induced mood disorder: Secondary | ICD-10-CM

## 2016-09-28 DIAGNOSIS — F1290 Cannabis use, unspecified, uncomplicated: Secondary | ICD-10-CM

## 2016-09-28 DIAGNOSIS — R45851 Suicidal ideations: Secondary | ICD-10-CM

## 2016-09-28 DIAGNOSIS — F17210 Nicotine dependence, cigarettes, uncomplicated: Secondary | ICD-10-CM | POA: Diagnosis not present

## 2016-09-28 MED ORDER — NICOTINE 21 MG/24HR TD PT24
21.0000 mg | MEDICATED_PATCH | Freq: Every day | TRANSDERMAL | Status: DC
  Administered 2016-09-28: 21 mg via TRANSDERMAL
  Filled 2016-09-28 (×3): qty 1

## 2016-09-28 MED ORDER — PROMETHAZINE HCL 25 MG PO TABS
25.0000 mg | ORAL_TABLET | Freq: Four times a day (QID) | ORAL | Status: DC | PRN
  Administered 2016-09-28 – 2016-09-30 (×7): 25 mg via ORAL
  Filled 2016-09-28 (×9): qty 1

## 2016-09-28 MED ORDER — BUTALBITAL-APAP-CAFFEINE 50-325-40 MG PO TABS
1.0000 | ORAL_TABLET | ORAL | Status: DC | PRN
  Administered 2016-09-28 – 2016-09-30 (×12): 1 via ORAL
  Filled 2016-09-28 (×13): qty 1

## 2016-09-28 NOTE — Progress Notes (Signed)
CSW spoke with Kerry DoryLaurie [facility] from [facility] who states the pt has been accepted for inpt treatment on 09/29/16. Call to report [phone] Accepting Dr. Kathleene HazelElizabeth Cox, MD Pt is able to arrive after 13:00 on 09/29/16.  Princess BruinsAquicha Teffany Blaszczyk, MSW, LCSWA CSW Disposition [phone]

## 2016-09-28 NOTE — Progress Notes (Signed)
Notified Dr Rene KocherEksir, on-call psychiatrist and was informed that he was not going to IVC her because he has not assessed her himself.

## 2016-09-28 NOTE — MAU Provider Note (Signed)
S/O:   Patient seen intermittently throughout the night            Has been awake most of night           Wants a TV to watch in her room, wants a shower.           Headache returned about 2am            Happy to be getting Subutex  Vitals:   09/27/16 0151 09/27/16 0741 09/27/16 1032 09/27/16 1519  BP: 113/69 128/70 120/60 122/81  Pulse: 74 70 76 (!) 58  Resp: 18 20 16 18   Temp: 98.2 F (36.8 C)   98.1 F (36.7 C)  TempSrc: Oral   Oral  Weight:      Height:        A:   SIngle IUP at 5928w6d        Narcotic abuse, potential for withdrawal, Subutex restarted        Suicidal Ideations with plan        No suicidal overtures tonight  P:   Continue to observe

## 2016-09-28 NOTE — Progress Notes (Signed)
Offered spiritual care services and pt declined speaking at this time.  Please page if needs arise.  Chaplain Dyanne CarrelKaty Sonoma Firkus, Bcc Pager, [phone] 4:21 PM    09/28/16 1600  Clinical Encounter Type  Visited With Patient

## 2016-09-28 NOTE — MAU Provider Note (Signed)
S: 31 y.o. [id number] @[redacted]w[redacted]d  is in observation in MAU for substance abuse and suicidal ideation on admission.  She is doing well and denies any pain, vaginal bleeding, LOF, n/v, or fever/chills.  [facility] Add[facility] the patient this afternoon and removed suicidal precautions.  The patient reported that she just told the RN on admission that she was suicidal because she wanted to get admitted so she could get help.  She reports the Subutex, started on this admission, is helping.  She has an inpatient admission planned for tomorrow as a [facility] pregnancy behavioral health evaluation/treatment.  She [facility] with a previous [facility] and is happy to [facility] and begin the program tomorrow.  She reports she is motivated to get help and work on her addiction because she cares about her children.  O: Patient Vitals for the past 24 hrs:  BP Pulse Resp  09/28/16 0600 120/74 (!) 59 18   VS reviewed, nursing note reviewed,  Constitutional: well developed, well nourished, no distress HEENT: normocephalic CV: normal rate Pulm/chest wall: normal effort Abdomen: soft Neuro: alert and oriented x 3 Skin: warm, dry Psych: affect normal  A: 1. Drug dependence complicating pregnancy in third trimester    P: NST daily, not yet done today, FHT wnl Q shift VS [facility] at 1:00 If pt desires to leave AMA, may involuntary commit patient per behavioral health  Sharen CounterLisa Leftwich-Kirby, CNM 7:01 PM

## 2016-09-28 NOTE — Progress Notes (Signed)
Everardo BealsBrad Jeter, [place] [employer] called here and stated they did a wellness check of the address she listed and spoke with a female individual, "old boyfriend" there that reported he had not seen her.   Notified Raelyn Moraolitta Dawson CNM.

## 2016-09-28 NOTE — Progress Notes (Addendum)
Disposition CSW spoke with Dr. Tawni LevyAronsen [phone]) to discuss pt's status and to get support for pt staying in the ER until she can be [facility].  Dr. Tawni LevyAronsen in agreement with this discharge plan and can be contacted if there are any questions or concerns.   CSW then spoke with Midwife, Minerva FesterLisa L, at [facility], who had already successfully advocated for pt [facility].  There is some concern that the pt may leave AMA but the Midwife has discussed this with the patient and the pt agrees to stay because she indicates she is motivated for treatment.    If patient does try and leave the ED again, before she is transported to [facility], she may be IVC'd. Dr. Ardeth SportsmanAlex Eksir, is the psychiatrist on call overnight.  He may be called at [phone])  if the EDP requires assistance. CSW spoke with Dr. Rene KocherEksir to advise.  CSW will [facility] blank [facility].  CSW has faxed IVC forms to Midwife, Misty StanleyLisa, in the event, pt attempts to leave the hospital again.  Papers MAY be faxed to the magistrate's office @ [phone] (after 5 PM) or [phone] (btwn 8A-5P)  For general questions for the Magistrate you may call   [phone] (after 5 PM) [phone] (8A-5P).    Patient is to be transported by [facility].  Call [phone] to arrange transportation to [facility] [place]. Pt may arrive after 1 PM on 09/29/16.    Per previous note entered by A. Duff: "CSW spoke with Kerry DoryLaurie Gardner from [facility] who states the pt has been accepted for inpt treatment on 09/29/16. Call to report [phone] Accepting Dr. Kathleene HazelElizabeth Cox, MD Pt is able to arrive after 13:00 on 09/29/16."    Carney BernJean T. Kaylyn LimSutter, MSW, LCSWA Clinical Social Work Disposition [phone]

## 2016-09-28 NOTE — Progress Notes (Signed)
CSW Doralee AlbinoAquicha D and Forest BeckerJean S spoke with Dr. Shela CommonsJ in order to discuss the disposition. Dr. Shela CommonsJ reports during the face to face consult with the pt, she denied SI and states she has [facility] to [facility] [place] [facility]/16 but she is unsure how she will get there. CSW advised Dr. Shela CommonsJ that [facility] and has an accepting physician and Misty StanleyLisa, the certified nurse midwife, is aware of the [facility]. CSW Forest BeckerJean S. Is taking over at 15:00 and will follow up with [facility] in order to discuss discharge plans for the pt.   Princess BruinsAquicha Sue Fernicola, MSW, LCSWA CSW Disposition [phone]

## 2016-09-28 NOTE — Progress Notes (Signed)
CSW [place] at 11:30 PM.  CPS Case Worker requested that [facility] [place] be notified if and when pt delivers at one of [facility].  Timmothy EulerJean T. Kaylyn LimSutter, MSW, LCSWA Clinical Social Work Disposition [phone]

## 2016-09-28 NOTE — Progress Notes (Signed)
CSW spoke with Sharen CounterLisa Leftwich-Kirby, certified nurse midwife, who states the pt was just seen by Dr. Shela CommonsJ and was "cleared." Case discussed with Marquette OldLaurie P., NP who agrees the pt will need inpt treatment. CSW explained the proccess to [place] who reports the pt is not IVC'd and she was under the impression the pt would have to be IVC'd prior to [facility]. CSW explained IVC vs VOL process and explained the pt has been [facility]. Misty StanleyLisa reports she will speak with the PA and the pt to discuss her willingness to get [facility] and Misty StanleyLisa will call back to CSW at [phone] to discuss any updates if the pt is going to be d/c or if she will receive [facility]pt treatment [facility]. If the pt is d/c, CSW to contact [facility] to inform them the pt is no longer coming.  Princess BruinsAquicha Makinley Muscato, MSW, LCSWA CSW Disposition [phone]

## 2016-09-28 NOTE — Progress Notes (Signed)
CSW notified Dr. Shela CommonsJ of the consult request today.  [name], MSW, LCSWA CSW Disposition [phone]

## 2016-09-28 NOTE — Progress Notes (Signed)
Pt returned to MAU, ran to her room, got a phone charger and went back to parking lot then came back in, went to bathroom, then came out and went to her room.  Pt stated she had been to see her kids. Notified Rolitta CNM (on unit) and house coverage Education administratorTanya RN.  Explained  that when [facility] let her take a walk earlier, that if she left the premises, that could be considered leaving AMA, and that she should have let us know that she was leaving. Pt denied any drug use while gone from here.

## 2016-09-28 NOTE — MAU Note (Signed)
Patient cleared by psychiatrist. Precautions dc/d. Patient requesting belongings; security called and bringing bag to patient.

## 2016-09-28 NOTE — Progress Notes (Signed)
CSW received call from Dr. Rene KocherEksir, psychiatrist on call.  Dr Rene KocherEksir stated that he received a call from [facility] telling him that the patient had told someone that she was going outside for walk and did not return.  Apparently a decision had been made earlier in the day to d/c the sitters that had been with the patient for much of her ED stay. There is no documentation of in the chart of when this decision was made.  CSW called [facility] and spoke with nurse assigned to pt at shift change, Adah PerlBenjy S.,  Nurse reports that she was told the last time pt was seen was at approximately 6:45 this evening.  Nurse related that pt told someone that she was going outside for a walk and did not return. CSW was advised that [facility] had attempted to contact pt's family.  CSW advised [facility] Nurse to contact [employer] and ask them to conduct a wellness check.  CSW conferred with Dr. Tawni LevyAronsen, who is in agreement.  CSW will place call to [facility] to make report.  If patient does not return to ED overnight, Daytime CSW has been asked to contact [facility] [phone] to cancel bed placement.  Timmothy EulerJean T. Kaylyn LimSutter, MSW, LCSWA Clinical Social Work Disposition [phone]
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
## 2016-09-28 NOTE — Progress Notes (Signed)
Notified Rolitta Arita Missawson CNM and house coverage Domenick Bookbinderanya Stallings RN of my call to on-call psychiatrist, Dr Rene KocherEksir.  Social Worker Carney BernJean called in, notified her pt has not returned. I contacted [facility] [employer] operator "Trinna Postlex" to do wellness check and pt's address provided.

## 2016-09-29 DIAGNOSIS — O99323 Drug use complicating pregnancy, third trimester: Secondary | ICD-10-CM

## 2016-09-29 NOTE — Progress Notes (Signed)
CSW spoke to psychiatrist, Dr. Louretta Shorten, who completed an assessment on pt yesterday morning and at that time did not feel patient met criteria for involuntary commitment.  CSW explained that pt. left the hospital yesterday evening and did not return for several hours, endangering herself and her unborn baby.  Given those circumstances, Dr. Louretta Shorten, now recommends that patient be IVC'd before she is transported to [facility] inpatient program.  CSW also contacted Dr. Ferne Reus, Director of Care Management for [facility], who is in agreement.  CSW notified [place] EDP, [place], and Transport planner, Raquel Sarna.  CSW faxed IVC forms to [facility] ED [phone]).  Areatha Keas. Judi Cong, MSW, [place] Work Disposition [phone]

## 2016-09-29 NOTE — Progress Notes (Signed)
Patient has been IVC'd and will be [facility].  CSW received a call from [facility]  indicating that [facility] will transport in the morning.  CSW called [facility] desk [phone]) and notified of delay in transport. Nurse was in agreement.    Timmothy EulerJean T. Kaylyn LimSutter, MSW, LCSWA Clinical Social Work Disposition [phone]

## 2016-09-29 NOTE — Progress Notes (Signed)
Notified [place] [employer] that pt has returned, spoke to Allstateperator "Davis".

## 2016-09-29 NOTE — MAU Note (Signed)
RN attempt to call report to [facility] facility; RN told that someone would call back for report around 330.

## 2016-09-29 NOTE — MAU Note (Addendum)
[facility] pick up scheduled for 4-415pm today. Denny PeonErin, NP in department and made aware.

## 2016-09-29 NOTE — Progress Notes (Signed)
CSW spoke with Kerry DoryLaurie Gardner from [facility] who states the pt still has been accepted for inpt treatment. Call to report [phone] at 3:00pm. Pt can arrive after 4:00pm. Accepting Dr. Kathleene HazelElizabeth Cox, MD   Vernie ShanksLauren Yarelis Ambrosino, LCSW Clinical Social Work [phone]

## 2016-09-29 NOTE — Progress Notes (Signed)
Informed by [name], RN that patient was not in her room when she came on tonight. The patient was allowed to take a walk outside, because "she was getting cramped" being in the small MAU room.  Patient has not been seen on the unit or around the hospital grounds since ~ 1845 tonight. Security and Engineer, manufacturingN staff outside actively looking for patient.  Ottis Stainanya Stalling, House coverage RN on unit instructing staff on appropriate steps to take.  [name], CNM 09/28/2016 9:00 PM

## 2016-09-29 NOTE — Progress Notes (Signed)
Asked pt if she wanted to continue with the previous plan [facility] tomorrow and pt states yes.

## 2016-09-29 NOTE — MAU Note (Signed)
IVC papers were served @ 2010. [facility] and they confirmed transport and will send an Technical sales engineerofficer to transport pt to next facility.   [facility] ID: [phone]

## 2016-09-29 NOTE — Progress Notes (Signed)
Fetal Tracing:  Baseline: 120 Variability: moderate Accelerations: 15x15 Decelerations: none  Toco: none   Reactive NST Judeth HornLawrence, Clarene Curran, NP

## 2016-09-29 NOTE — Progress Notes (Signed)
CSW left message with Marily MemosLaurie Garnder, intake coordinator for [facility] perin[facility] regarding status of referral. Awaiting return call. CSW also spoke with [facility] who reports no beds available on the unit at this time.   Vernie ShanksLauren Yuta Cipollone, LCSW Clinical Social Work [phone]

## 2016-09-29 NOTE — MAU Note (Addendum)
@  1020: Social Worker called MAU charge RN to inform that facility has moved patient bed placement from 1pm today to 4pm today with call report at 3pm.  Denny PeonErin NP in department and notified of time change.  @1022 : Rn called [facility] [phone]). Rn spoke with April whom states will pass message to second shift and that they would call with update on pick up time.  [facility] at [address] [place] per Heriberto AntiguaJean Sutter, LCSWA note.

## 2016-09-29 NOTE — MAU Note (Signed)
[place] [facility] called back and reported that the [employer] is unable to transport pt tonight because there are no officers on duty. Spoke to an Technical sales engineerofficer and he stated it will be around 10 am before anyone will be able to transport the pt. House coverage notified. Provider notified.   Transport Office: [phone]

## 2016-09-29 NOTE — Progress Notes (Signed)
Informed by Lucretia FieldJean Sutton, LCSW, that patient would need to be involuntarily committed. This recommendation comes from the psychiatrist who evaluated patient yesterday (Dr. Elsie SaasJonnalagadda) due to patient leaving premises for 5+ hours last night.  IVC papers filled out & [place]ed to [facility].   Judeth HornLawrence, Castor Gittleman, NP

## 2016-09-29 NOTE — MAU Note (Signed)
IVC papers obtained and faxed to magistrate. Erin NP provider at bedside discussing plan of care.  Sitter reinstated and safety environmental rounds completed with 2 RN's.

## 2016-09-29 NOTE — Progress Notes (Signed)
CEFM  FHR: 140 bpm / moderate variability / accels present / decels absent TOCO: rare UI noted  Ok to d/c NST.  Raelyn MoraRolitta Davanna He, CNM 09/29/2016 8:13 AM

## 2016-09-30 NOTE — MAU Note (Signed)
@  [id number] [name] called to inform RN of transportation time of around 10am. Paperwork confirmed.  @0857  RN called [facility] to give report. RN unavailable at the time; receptionist took information for call back.

## 2016-09-30 NOTE — MAU Note (Addendum)
@   [phone] Officer Montez Moritaarter arrived for transport Paperwork reviewed Misty StanleyLisa, CNM bedside and aware.  @[phone]  patient left with Loss adjuster, charteredfficer Carter for transport to [facility] in [place]. Full transfer report and IVC papers sent with Officer. Patient itemized belongings sent with officer.   @[phone]  RN called [facility] to give report. Tori RN given report on patient including last medication administration.

## 2016-09-30 NOTE — MAU Provider Note (Signed)
S: 31 y.o. [name] @[redacted]w[redacted]d  is in observation in MAU for substance abuse and suicidal ideation on admission. She presented on 6/5 with report of gush of fluid and irregular contractions.  Labor evaluation indicated membranes were not ruptured and she was not in active labor.  The patient reported suicidal ideations and asked for help from her drug addiction.  Placement [facility] at [facility] as an emergency room transfer for a patient involuntarily committed.  During her MAU stay she was started on Subutex and had involuntary commitment papers served.  She had 1 on 1 sitter and was cooperative and appropriate.  [name].  They are to assume care of her from a psychological and obstetric standpoint.  At the time of transfer she reported good fetal movement, denied regular contractions, leakage of fluid, vaginal bleeding, h/a, n/v, or fever/chills.  She walked with the officers out of MAU and was cooperative.  O: BP 125/80 (BP Location: Left Arm)   Pulse 76   Temp 97.7 F (36.5 C) (Oral) Comment: patient returned from smoking  Resp (!) 22   Ht 5\' 5"  (1.651 m)   Wt 153 lb (69.4 kg)   LMP 12/31/2015 (Approximate)   SpO2 100%   BMI 25.46 kg/m    VS reviewed, nursing note reviewed,  Constitutional: well developed, well nourished, no distress HEENT: normocephalic CV: normal rate Pulm/chest wall: normal effort Abdomen: soft Neuro: alert and oriented x 3 Skin: warm, dry Psych: affect normal  NST done 09/29/16 was reactive, FHR baseline 125, moderate variability, positive accels, no decels and rare mild contractions on toco  A: 1. Drug dependence complicating pregnancy in third trimester   2. Involuntary commitment    P: [facility] Labor precautions reviewed Pt stable at time of transfer  Sharen CounterLisa  Leftwich-Kirby, CNM 10:12 AM

## 2016-12-06 ENCOUNTER — Emergency Department (HOSPITAL_COMMUNITY)
Admission: EM | Admit: 2016-12-06 | Discharge: 2016-12-06 | Disposition: A | Discharge status: Home / Self Care | Payer: Medicaid Other | Attending: Emergency Medicine | Admitting: Emergency Medicine

## 2016-12-06 ENCOUNTER — Encounter (HOSPITAL_COMMUNITY): Payer: Self-pay | Admitting: Emergency Medicine

## 2016-12-06 DIAGNOSIS — Z79899 Other long term (current) drug therapy: Secondary | ICD-10-CM | POA: Insufficient documentation

## 2016-12-06 DIAGNOSIS — Z91040 Latex allergy status: Secondary | ICD-10-CM | POA: Insufficient documentation

## 2016-12-06 DIAGNOSIS — F17210 Nicotine dependence, cigarettes, uncomplicated: Secondary | ICD-10-CM | POA: Diagnosis not present

## 2016-12-06 DIAGNOSIS — R51 Headache: Secondary | ICD-10-CM | POA: Diagnosis not present

## 2016-12-06 DIAGNOSIS — R519 Headache, unspecified: Secondary | ICD-10-CM

## 2016-12-06 MED ORDER — PROCHLORPERAZINE EDISYLATE 5 MG/ML IJ SOLN
10.0000 mg | Freq: Once | INTRAMUSCULAR | Status: AC
  Administered 2016-12-06: 10 mg via INTRAVENOUS
  Filled 2016-12-06: qty 2

## 2016-12-06 MED ORDER — DEXAMETHASONE SODIUM PHOSPHATE 10 MG/ML IJ SOLN
10.0000 mg | Freq: Once | INTRAMUSCULAR | Status: AC
  Administered 2016-12-06: 10 mg via INTRAVENOUS
  Filled 2016-12-06: qty 1

## 2016-12-06 MED ORDER — BUTALBITAL-APAP-CAFFEINE 50-325-40 MG PO TABS
2.0000 | ORAL_TABLET | Freq: Once | ORAL | Status: AC
  Administered 2016-12-06: 2 via ORAL
  Filled 2016-12-06: qty 2

## 2016-12-06 MED ORDER — MAGNESIUM SULFATE IN D5W 1-5 GM/100ML-% IV SOLN
1.0000 g | Freq: Once | INTRAVENOUS | Status: AC
  Administered 2016-12-06: 1 g via INTRAVENOUS
  Filled 2016-12-06: qty 100

## 2016-12-06 MED ORDER — SODIUM CHLORIDE 0.9 % IV BOLUS (SEPSIS)
1000.0000 mL | Freq: Once | INTRAVENOUS | Status: AC
  Administered 2016-12-06: 1000 mL via INTRAVENOUS

## 2016-12-06 MED ORDER — BUTALBITAL-APAP-CAFFEINE 50-325-40 MG PO TABS: 1.0000 | ORAL_TABLET | Freq: Four times a day (QID) | ORAL | 0 refills | Status: DC | PRN

## 2016-12-06 MED ORDER — DIPHENHYDRAMINE HCL 50 MG/ML IJ SOLN
12.5000 mg | Freq: Once | INTRAMUSCULAR | Status: AC
  Administered 2016-12-06: 12.5 mg via INTRAVENOUS
  Filled 2016-12-06: qty 1
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
## 2016-12-06 NOTE — ED Provider Notes (Signed)
  Physical Exam  BP 134/86 (BP Location: Left Arm)   Pulse 60   Temp 98 F (36.7 C) (Oral)   Resp 18   Ht 5\' 4"  (1.626 m)   Wt 68 kg (150 lb)   LMP 12/31/2015 (Approximate)   SpO2 100%   Breastfeeding? Unknown   BMI 25.75 kg/m   Physical Exam  Constitutional: She appears well-developed and well-nourished. No distress.  Sleeping comfortably upon my arrival.  HENT:  Head: Normocephalic and atraumatic.  Eyes: Conjunctivae and EOM are normal. No scleral icterus.  Neck: Normal range of motion.  Pulmonary/Chest: Effort normal. No respiratory distress.  Neurological: She is alert.  Skin: No rash noted. She is not diaphoretic.  Psychiatric: She has a normal mood and affect.  Nursing note and vitals reviewed.   ED Course  Procedures  MDM Care handed off from previous provider. Briefly, patient presents to ED for evaluation of 2 day history of constant headache with associated nausea. She has tried Tylenol, NSAIDs and oral narcotics for symptoms. Patient given Compazine, Benadryl, Fioricet here in the ED with no improvement in symptoms. Subsequently given Decadron. We'll reassess at 0730.  0745: Patient reports no improvement in symptoms with Decadron. We'll give the magnesium infusion as suggested by previous provider.  1030: On reassessment after magnesium infusion, patient states that she is still having a headache. She is inquiring about another dose of Fioricet which she states helped her initially. Will give another dose and then reassess. She is ambulatory to restroom on her own.  1140: Patient reports improvement in symptoms with a second dose of Fioricet. She feels that she is ready to go home now. We'll discharge with prescription for Fioricet. Strict return precautions given.    Dietrich PatesKhatri, Cayleen Benjamin, PA-C 12/06/16 1141    Linwood DibblesKnapp, Jon, MD 12/09/16 1011

## 2016-12-06 NOTE — ED Triage Notes (Signed)
Pt reports to having headache for the last two days and has taken motrin, tylenol, and morphine 30mg  for pain with no relief.

## 2016-12-06 NOTE — Discharge Instructions (Signed)
Take Fioricet as needed as directed. Return to ED for severe headaches, vision changes, numbness, weakness, trouble walking, headache injuries or loss of consciousness.

## 2016-12-07 ENCOUNTER — Emergency Department (HOSPITAL_COMMUNITY)
Admission: EM | Admit: 2016-12-07 | Discharge: 2016-12-07 | Disposition: A | Discharge status: Home / Self Care | Payer: Medicaid Other | Attending: Emergency Medicine | Admitting: Emergency Medicine

## 2016-12-07 ENCOUNTER — Encounter (HOSPITAL_COMMUNITY): Payer: Self-pay

## 2016-12-07 DIAGNOSIS — Z91040 Latex allergy status: Secondary | ICD-10-CM | POA: Insufficient documentation

## 2016-12-07 DIAGNOSIS — F17210 Nicotine dependence, cigarettes, uncomplicated: Secondary | ICD-10-CM | POA: Diagnosis not present

## 2016-12-07 DIAGNOSIS — R51 Headache: Secondary | ICD-10-CM | POA: Diagnosis present

## 2016-12-07 DIAGNOSIS — R519 Headache, unspecified: Secondary | ICD-10-CM

## 2016-12-07 MED ORDER — KETOROLAC TROMETHAMINE 30 MG/ML IJ SOLN
30.0000 mg | Freq: Once | INTRAMUSCULAR | Status: AC
  Administered 2016-12-07: 30 mg via INTRAMUSCULAR
  Filled 2016-12-07: qty 1

## 2016-12-07 NOTE — ED Notes (Signed)
Bed: [facility] Expected date:  Expected time:  Means of arrival:  Comments: 
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
## 2016-12-07 NOTE — ED Triage Notes (Signed)
Pt complains of a severe migraine She states the medications she received yesterday worked but she was unable to get her prescriptions filled today

## 2016-12-07 NOTE — ED Notes (Signed)
Pt. Refused to get into hospital gown. RN,Nikki made aware.

## 2017-03-22 ENCOUNTER — Other Ambulatory Visit: Payer: Self-pay

## 2017-03-22 ENCOUNTER — Encounter (HOSPITAL_COMMUNITY): Payer: Self-pay | Admitting: Emergency Medicine

## 2017-03-22 ENCOUNTER — Emergency Department (HOSPITAL_COMMUNITY)
Admission: EM | Admit: 2017-03-22 | Discharge: 2017-03-22 | Disposition: A | Discharge status: Home / Self Care | Payer: Self-pay | Attending: Emergency Medicine | Admitting: Emergency Medicine

## 2017-03-22 DIAGNOSIS — Z79899 Other long term (current) drug therapy: Secondary | ICD-10-CM | POA: Insufficient documentation

## 2017-03-22 DIAGNOSIS — F17210 Nicotine dependence, cigarettes, uncomplicated: Secondary | ICD-10-CM | POA: Insufficient documentation

## 2017-03-22 DIAGNOSIS — K0889 Other specified disorders of teeth and supporting structures: Secondary | ICD-10-CM | POA: Insufficient documentation

## 2017-03-22 LAB — BASIC METABOLIC PANEL
Anion gap: 5 (ref 5–15)
BUN: 17 mg/dL (ref 6–20)
CALCIUM: 9 mg/dL (ref 8.9–10.3)
CHLORIDE: 107 mmol/L (ref 101–111)
CO2: 24 mmol/L (ref 22–32)
CREATININE: 0.72 mg/dL (ref 0.44–1.00)
GFR calc non Af Amer: >60 mL/min (ref >60)
Glucose, Bld: 98 mg/dL (ref 65–99)
Potassium: 4.3 mmol/L (ref 3.5–5.1)
SODIUM: 136 mmol/L (ref 135–145)

## 2017-03-22 MED ORDER — AMOXICILLIN 500 MG PO CAPS: 500.0000 mg | ORAL_CAPSULE | Freq: Three times a day (TID) | ORAL | 0 refills | Status: DC

## 2017-03-22 MED ORDER — BUTALBITAL-APAP-CAFFEINE 50-325-40 MG PO TABS: 2.0000 | ORAL_TABLET | ORAL | 0 refills | Status: DC | PRN

## 2017-03-22 MED ORDER — BUTALBITAL-APAP-CAFFEINE 50-325-40 MG PO TABS
2.0000 | ORAL_TABLET | Freq: Once | ORAL | Status: AC
  Administered 2017-03-22: 2 via ORAL
  Filled 2017-03-22: qty 2

## 2017-03-22 MED ORDER — HYDROCODONE-ACETAMINOPHEN 5-325 MG PO TABS: 2.0000 | ORAL_TABLET | Freq: Once | ORAL | Status: DC

## 2017-03-22 NOTE — ED Triage Notes (Signed)
Pt with R lower jaw pain / dental pain x 1 week, not able to see dentist. Pt states she has taken aleve and no relief for pain. Pt told EMS she is a heroin and cocaine abuser.

## 2017-03-22 NOTE — Discharge Instructions (Signed)
Take ibuprofen only as directed.
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
## 2017-04-13 ENCOUNTER — Emergency Department (HOSPITAL_COMMUNITY)
Admission: EM | Admit: 2017-04-13 | Discharge: 2017-04-13 | Disposition: A | Discharge status: Home / Self Care | Payer: Self-pay | Attending: Emergency Medicine | Admitting: Emergency Medicine

## 2017-04-13 ENCOUNTER — Other Ambulatory Visit: Payer: Self-pay

## 2017-04-13 ENCOUNTER — Encounter (HOSPITAL_COMMUNITY): Payer: Self-pay | Admitting: Nurse Practitioner

## 2017-04-13 DIAGNOSIS — F17210 Nicotine dependence, cigarettes, uncomplicated: Secondary | ICD-10-CM | POA: Insufficient documentation

## 2017-04-13 DIAGNOSIS — N72 Inflammatory disease of cervix uteri: Secondary | ICD-10-CM | POA: Insufficient documentation

## 2017-04-13 DIAGNOSIS — F1110 Opioid abuse, uncomplicated: Secondary | ICD-10-CM | POA: Insufficient documentation

## 2017-04-13 DIAGNOSIS — R112 Nausea with vomiting, unspecified: Secondary | ICD-10-CM

## 2017-04-13 DIAGNOSIS — R102 Pelvic and perineal pain: Secondary | ICD-10-CM | POA: Insufficient documentation

## 2017-04-13 DIAGNOSIS — F1410 Cocaine abuse, uncomplicated: Secondary | ICD-10-CM | POA: Insufficient documentation

## 2017-04-13 DIAGNOSIS — F1210 Cannabis abuse, uncomplicated: Secondary | ICD-10-CM | POA: Insufficient documentation

## 2017-04-13 DIAGNOSIS — G43909 Migraine, unspecified, not intractable, without status migrainosus: Secondary | ICD-10-CM | POA: Insufficient documentation

## 2017-04-13 DIAGNOSIS — G43409 Hemiplegic migraine, not intractable, without status migrainosus: Secondary | ICD-10-CM

## 2017-04-13 DIAGNOSIS — K029 Dental caries, unspecified: Secondary | ICD-10-CM | POA: Insufficient documentation

## 2017-04-13 LAB — I-STAT BETA HCG BLOOD, ED (MC, WL, AP ONLY)

## 2017-04-13 MED ORDER — ONDANSETRON 8 MG PO TBDP
8.0000 mg | ORAL_TABLET | Freq: Once | ORAL | Status: AC
  Administered 2017-04-13: 8 mg via ORAL
  Filled 2017-04-13: qty 1

## 2017-04-13 MED ORDER — CEFTRIAXONE SODIUM 250 MG IJ SOLR
250.0000 mg | Freq: Once | INTRAMUSCULAR | Status: AC
  Administered 2017-04-13: 250 mg via INTRAMUSCULAR
  Filled 2017-04-13: qty 250

## 2017-04-13 MED ORDER — ONDANSETRON 4 MG PO TBDP: 4.0000 mg | ORAL_TABLET | Freq: Once | ORAL | Status: DC | PRN

## 2017-04-13 MED ORDER — SODIUM CHLORIDE 0.9 % IV BOLUS (SEPSIS)
1000.0000 mL | Freq: Once | INTRAVENOUS | Status: AC
  Administered 2017-04-13: 1000 mL via INTRAVENOUS

## 2017-04-13 MED ORDER — BUTALBITAL-APAP-CAFFEINE 50-325-40 MG PO TABS: 2.0000 | ORAL_TABLET | ORAL | 0 refills | Status: DC | PRN

## 2017-04-13 MED ORDER — KETOROLAC TROMETHAMINE 15 MG/ML IJ SOLN
15.0000 mg | Freq: Once | INTRAMUSCULAR | Status: AC
Start: 2017-04-13 — End: 2017-04-13
  Administered 2017-04-13: 15 mg via INTRAVENOUS
  Filled 2017-04-13: qty 1

## 2017-04-13 MED ORDER — AMOXICILLIN 500 MG PO CAPS: 500.0000 mg | ORAL_CAPSULE | Freq: Three times a day (TID) | ORAL | 0 refills | Status: DC

## 2017-04-13 MED ORDER — AZITHROMYCIN 500 MG IV SOLR
500.0000 mg | Freq: Once | INTRAVENOUS | Status: AC
  Administered 2017-04-13: 500 mg via INTRAVENOUS
  Filled 2017-04-13: qty 500

## 2017-04-13 MED ORDER — PROMETHAZINE HCL 25 MG PO TABS: 25.0000 mg | ORAL_TABLET | Freq: Four times a day (QID) | ORAL | 0 refills | Status: DC | PRN

## 2017-04-13 MED ORDER — BUTALBITAL-APAP-CAFFEINE 50-325-40 MG PO TABS
1.0000 | ORAL_TABLET | Freq: Once | ORAL | Status: AC
  Administered 2017-04-13: 1 via ORAL
  Filled 2017-04-13: qty 1

## 2017-04-13 MED ORDER — LIDOCAINE HCL (PF) 1 % IJ SOLN
INTRAMUSCULAR | Status: AC
  Administered 2017-04-13: 0.9 mL
  Filled 2017-04-13: qty 5

## 2017-04-13 NOTE — ED Triage Notes (Signed)
Pt BIB ems with c/o nausea, vomiting, and migraines. Patient reported to ems that she did cocaine last night. Per ems when they arrived patient was sitting on porch and  walked out to truck without difficulty but did have an episode of vomiting prior to entering truck. She slept during in route without complications. She denies any pain. Patient is A&O x4. VS: 120/80, 18, 65, 98% RA, NSR [id number]. Patient got up off stretcher and went to bathroom upon arrival to triage. Ambulated without assistance.

## 2017-04-13 NOTE — ED Notes (Signed)
Bed: WTR5 Expected date:  Expected time:  Means of arrival:  Comments: 

## 2017-04-13 NOTE — ED Notes (Signed)
Patient noted walking in and outside during wait in lobby. Patient advised to remain in lobby area in case she is being called. Patient requested snacks, drink, and warm blanket. Advised she could not have anything to eat or drink however warm blanket was given.

## 2017-04-13 NOTE — ED Provider Notes (Addendum)
[facility]-EMERGENCY DEPT Provider Note: Lowella DellJ. Lane [name], MD, FACEP  CSN: [id number] MRN: [id number] ARRIVAL: 04/13/17 at 0309 ROOM: WA09/WA09   CHIEF COMPLAINT  Vomiting   HISTORY OF PRESENT ILLNESS  04/13/17 6:02 AM [name] is a 31 y.o. female with a history of migraines.  She is here with a 2-day history of nausea, vomiting, diarrhea and headaches.  She states the headaches are like her usual migraines but she attributes increased frequency to multiple carious upper teeth.  She has admitted to using cocaine last night.  Despite telling me she has not eaten in 2 days she was observed eating while in triage this morning.  She has not had any observed episodes of emesis since arriving in the ED.  She denied pain on arrival but is complaining of moderate to severe headache pain presently.  She is also complaining of vaginal pain and bleeding despite her regular occurring approximately 2 weeks ago.   Past Medical History:  Diagnosis Date  . Bartholin cyst   . Endometriosis   . Glaucoma   . History of PID   . Hx of migraines   . Ovarian cyst   . STD (female)    hx of chlamydia and gonorrhea  . Substance abuse (HCC)   . Vaginal Pap smear, abnormal    has not followed up    Past Surgical History:  Procedure Laterality Date  . DILATION AND CURETTAGE OF UTERUS    . FRACTURE SURGERY     left leg  . LAPAROSCOPY    . WISDOM TOOTH EXTRACTION      Family History  Problem Relation Age of Onset  . Diabetes Mother   . Hypertension Maternal Grandmother   . Heart disease Maternal Grandmother        great grandma  . Anesthesia problems Neg Hx   . Hypotension Neg Hx   . Malignant hyperthermia Neg Hx   . Pseudochol deficiency Neg Hx   . Alcohol abuse Neg Hx     Social History   Tobacco Use  . Smoking status: Current Every Day Smoker    Packs/day: 0.50    Years: 7.00    Pack years: 3.50    Types: Cigarettes  . Smokeless tobacco: Never Used  Substance Use Topics  . Alcohol  use: No    Comment: Denies ETOH use  . Drug use: Yes    Types: Marijuana, Cocaine, Heroin    Comment: patient states she last used cocaine and marijuana last night     Prior to Admission medications   Medication Sig Start Date End Date Taking? Authorizing Provider  acetaminophen (TYLENOL) 500 MG tablet Take 1,000 mg by mouth every 6 (six) hours as needed for mild pain, moderate pain or headache.   Yes [provider]  ibuprofen (ADVIL,MOTRIN) 200 MG tablet Take 200-400 mg by mouth every 6 (six) hours as needed for headache, mild pain or moderate pain.    Yes [provider]  naproxen sodium (ALEVE) 220 MG tablet Take 220-440 mg by mouth 2 (two) times daily as needed (pain).   Yes [provider]  amoxicillin (AMOXIL) 500 MG capsule Take 1 capsule (500 mg total) by mouth 3 (three) times daily. Patient not taking: Reported on 04/13/2017 03/22/17   Elson AreasSofia, Leslie K, PA-C  butalbital-acetaminophen-caffeine (FIORICET, ESGIC) 502-042-359550-325-40 MG tablet Take 2 tablets by mouth every 4 (four) hours as needed for headache. Patient not taking: Reported on 04/13/2017 03/22/17   Elson AreasSofia, Leslie K, PA-C  promethazine (PHENERGAN) 25 MG tablet Take 1 tablet (25 mg total) by mouth every 6 (six) hours as needed for nausea or vomiting. Patient not taking: Reported on 12/06/2016 09/12/16   Hurshel PartyLeftwich-Kirby, Lisa A, CNM  terconazole (TERAZOL 7) 0.4 % vaginal cream Place 1 applicator vaginally at bedtime. Patient not taking: Reported on 12/06/2016 09/12/16   Sharen CounterLeftwich-Kirby, Lisa A, CNM    Allergies Acyclovir and related; Darvocet [propoxyphene n-acetaminophen]; Doxycycline; Flexeril [cyclobenzaprine]; Metoclopramide; Naproxen; Latex; and Tramadol   REVIEW OF SYSTEMS  Negative except as noted here or in the History of Present Illness.   PHYSICAL EXAMINATION  Initial Vital Signs Blood pressure 107/90, pulse 89, temperature 98.3 F (36.8 C), resp. rate 20, last menstrual period 04/12/2017, SpO2  98 %, unknown if currently breastfeeding.  Examination General: Well-developed, well-nourished female in no acute distress; appearance consistent with age of record HENT: normocephalic; atraumaticl; multiple carious teeth Eyes: pupils equal, round and reactive to light; extraocular muscles intact Neck: supple Heart: regular rate and rhythm Lungs: clear to auscultation bilaterally Abdomen: soft; nondistended; mild diffuse tenderness; no masses or hepatosplenomegaly; bowel sounds present GU: Normal external genitalia; vaginal bleeding; cervical motion tenderness Extremities: No deformity; full range of motion; pulses normal Neurologic: Awake, alert and oriented; motor function intact in all extremities and symmetric; no facial droop Skin: Warm and dry Psychiatric: Normal mood and affect   RESULTS  Summary of this visit's results, reviewed by myself:   EKG Interpretation  Date/Time:    Ventricular Rate:    PR Interval:    QRS Duration:   QT Interval:    QTC Calculation:   R Axis:     Text Interpretation:        Laboratory Studies: Results for orders placed or performed during the hospital encounter of 04/13/17 (from the past 24 hour(s))  I-Stat beta hCG blood, ED     Status: None   Collection Time: 04/13/17  7:06 AM  Result Value Ref Range   I-stat hCG, quantitative <5.0 <5 mIU/mL   Comment 3           Imaging Studies: No results found.  ED COURSE  Nursing notes and initial vitals signs, including pulse oximetry, reviewed.  Vitals:   04/13/17 0309 04/13/17 0312 04/13/17 0725  BP: 107/90  (!) 145/87  Pulse: 89  65  Resp: 20  16  Temp: 98.3 F (36.8 C)    SpO2: 96% 98% 97%   7:29 AM Pregnancy test negative.  Patient repeatedly asking for pain medication but in light of her known substance abuse we will avoid narcotics.  We will administer IV Toradol for her headache and pelvic pain.  We will treat for cervicitis as this is likely the cause of her acute pelvic pain  with bleeding.  We will refer to oral surgery for treatment of her multiple carious teeth but she states she will not be able to follow-up due to lack of insurance.  PROCEDURES    ED DIAGNOSES     ICD-10-CM   1. Nausea and vomiting in adult R11.2   2. Sporadic migraine G43.409   3. Cervicitis N72   4. Dental caries K02.9        Javonda Suh, Jonny RuizJohn, MD 04/13/17 [id number]    Paula LibraMolpus, Azalynn Maxim, MD 04/13/17 [phone]

## 2017-04-13 NOTE — ED Notes (Signed)
Pt will go to sleep if not stimulated. When entering the room the pt is resting.

## 2017-04-14 LAB — GC/CHLAMYDIA PROBE AMP (~~LOC~~) NOT AT ARMC
Chlamydia: NEGATIVE
Neisseria Gonorrhea: POSITIVE — AB

## 2017-04-25 ENCOUNTER — Encounter (HOSPITAL_COMMUNITY): Payer: Self-pay | Admitting: Emergency Medicine

## 2017-04-25 ENCOUNTER — Emergency Department (HOSPITAL_COMMUNITY)
Admission: EM | Admit: 2017-04-25 | Discharge: 2017-04-26 | Disposition: A | Discharge status: Home / Self Care | Payer: Self-pay | Attending: Emergency Medicine | Admitting: Emergency Medicine

## 2017-04-25 ENCOUNTER — Other Ambulatory Visit: Payer: Self-pay

## 2017-04-25 DIAGNOSIS — R4182 Altered mental status, unspecified: Secondary | ICD-10-CM | POA: Insufficient documentation

## 2017-04-25 DIAGNOSIS — Z79899 Other long term (current) drug therapy: Secondary | ICD-10-CM | POA: Insufficient documentation

## 2017-04-25 DIAGNOSIS — F17210 Nicotine dependence, cigarettes, uncomplicated: Secondary | ICD-10-CM | POA: Insufficient documentation

## 2017-04-25 DIAGNOSIS — F1910 Other psychoactive substance abuse, uncomplicated: Secondary | ICD-10-CM | POA: Insufficient documentation

## 2017-04-25 LAB — COMPREHENSIVE METABOLIC PANEL
ALBUMIN: 4.5 g/dL (ref 3.5–5.0)
ALT: 36 U/L (ref 14–54)
ANION GAP: 11 (ref 5–15)
AST: 70 U/L — ABNORMAL HIGH (ref 15–41)
Alkaline Phosphatase: 77 U/L (ref 38–126)
BUN: 17 mg/dL (ref 6–20)
CHLORIDE: 103 mmol/L (ref 101–111)
CO2: 24 mmol/L (ref 22–32)
Calcium: 9.2 mg/dL (ref 8.9–10.3)
Creatinine, Ser: 0.89 mg/dL (ref 0.44–1.00)
GFR calc Af Amer: >60 mL/min (ref >60)
GFR calc non Af Amer: >60 mL/min (ref >60)
GLUCOSE: 86 mg/dL (ref 65–99)
POTASSIUM: 3.6 mmol/L (ref 3.5–5.1)
SODIUM: 138 mmol/L (ref 135–145)
TOTAL PROTEIN: 7.6 g/dL (ref 6.5–8.1)
Total Bilirubin: 0.9 mg/dL (ref 0.3–1.2)

## 2017-04-25 LAB — CBC WITH DIFFERENTIAL/PLATELET
BASOS ABS: 0 10*3/uL (ref 0.0–0.1)
Basophils Relative: 0 %
Eosinophils Absolute: 0.2 10*3/uL (ref 0.0–0.7)
Eosinophils Relative: 2 %
HEMATOCRIT: 39.3 % (ref 36.0–46.0)
Hemoglobin: 13.6 g/dL (ref 12.0–15.0)
LYMPHS ABS: 3.4 10*3/uL (ref 0.7–4.0)
LYMPHS PCT: 36 %
MCH: 29.6 pg (ref 26.0–34.0)
MCHC: 34.6 g/dL (ref 30.0–36.0)
MCV: 85.6 fL (ref 78.0–100.0)
MONOS PCT: 10 %
Monocytes Absolute: 0.9 10*3/uL (ref 0.1–1.0)
NEUTROS ABS: 4.9 10*3/uL (ref 1.7–7.7)
Neutrophils Relative %: 52 %
Platelets: 430 10*3/uL — ABNORMAL HIGH (ref 150–400)
RBC: 4.59 MIL/uL (ref 3.87–5.11)
RDW: 13.9 % (ref 11.5–15.5)
WBC: 9.4 10*3/uL (ref 4.0–10.5)

## 2017-04-25 LAB — RAPID URINE DRUG SCREEN, HOSP PERFORMED
Amphetamines: NOT DETECTED
BARBITURATES: NOT DETECTED
Benzodiazepines: POSITIVE — AB
COCAINE: POSITIVE — AB
Opiates: POSITIVE — AB
TETRAHYDROCANNABINOL: NOT DETECTED

## 2017-04-25 LAB — POC URINE PREG, ED: Preg Test, Ur: NEGATIVE

## 2017-04-25 MED ORDER — LORAZEPAM 2 MG/ML IJ SOLN
2.0000 mg | Freq: Once | INTRAMUSCULAR | Status: AC
  Administered 2017-04-25: 2 mg via INTRAVENOUS
  Filled 2017-04-25: qty 1
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
## 2017-04-25 NOTE — ED Notes (Signed)
At this time pt very anxious and agitated. Pt given IV Ativan and provided warm blanket.
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
## 2017-04-26 MED ORDER — LORAZEPAM 2 MG/ML IJ SOLN
2.0000 mg | Freq: Once | INTRAMUSCULAR | Status: AC
  Administered 2017-04-26: 2 mg via INTRAVENOUS
  Filled 2017-04-26: qty 1

## 2017-04-26 NOTE — ED Notes (Signed)
Pt found standing up out of bed eating peanut butter container. Peanut butter container removed from pt mouth and pt assisted back into bed and placed back onto monitor. Will continue to monitor.

## 2017-04-26 NOTE — Patient Outreach (Signed)
CPSS met with the patient and provided substance use recovery support. Patient was very sleepy and not interested in speaking with CPSS about substance use recovery resources. CPSS still provided and explained different resources with [facility]: a syringe exchange program in [place], a substance use recovery support group meeting list, and a list of outpatient/inpatient substance use recovery treatment centers. CPSS also provided [facility] contact information and informed the patient to feel free to contact [facility]PSS for further substance use recovery support.

## 2017-04-26 NOTE — ED Notes (Signed)
Pt given a [place] sandwich, applesauce, cheese, and apple juice

## 2017-04-26 NOTE — ED Provider Notes (Signed)
Signed out to d/c to home when gets up this AM.  Recheck pt - pt is awake and alert. Conversant w staff. Pt has eaten/drank this AM.  Vital signs normal.    SW and Peer Support were consulted and provided patient with outpatient resources.   Patient currently has normal/mood affect, no new c/o, and appears stable for d/c.      Kathy LaineSteinl, Kathy Kier, MD 04/26/17 [phone]

## 2017-04-26 NOTE — Progress Notes (Signed)
CSW met with patient via bedside to discuss housing concerns. Patient stated she had too much anxiety and did not want to speak at the moment. Due to patient being discharged, CSW left homeless shelter resources next to bedside.   Off note, CSW noted history of drug abuse in patients chart- CSW requested peer support consult to follow patient in community.   Kingsley Spittle, [facility] Emergency Room Clinical Social Worker [phone]

## 2017-04-26 NOTE — ED Notes (Signed)
Pt given warm blankets, cheese stick and ice water. Pts cords have been untangled and pt was placed back in a gown.

## 2017-04-26 NOTE — Discharge Instructions (Signed)
It was our pleasure to provide your ER care today - we hope that you feel better.  Use resources provided, including by social work and peer support services.  Also follow up with primary care doctor in the next 1-2 weeks.   Return to ER if worse, new symptoms, fevers, trouble breathing, or other medical emergency.

## 2017-04-29 ENCOUNTER — Other Ambulatory Visit: Payer: Self-pay

## 2017-04-29 ENCOUNTER — Emergency Department (HOSPITAL_COMMUNITY)
Admission: EM | Admit: 2017-04-29 | Discharge: 2017-04-30 | Disposition: A | Discharge status: Home / Self Care | Payer: Self-pay | Attending: Emergency Medicine | Admitting: Emergency Medicine

## 2017-04-29 ENCOUNTER — Encounter (HOSPITAL_COMMUNITY): Payer: Self-pay | Admitting: Emergency Medicine

## 2017-04-29 DIAGNOSIS — F1920 Other psychoactive substance dependence, uncomplicated: Secondary | ICD-10-CM

## 2017-04-29 DIAGNOSIS — F1910 Other psychoactive substance abuse, uncomplicated: Secondary | ICD-10-CM | POA: Insufficient documentation

## 2017-04-29 DIAGNOSIS — F17210 Nicotine dependence, cigarettes, uncomplicated: Secondary | ICD-10-CM | POA: Insufficient documentation

## 2017-04-29 DIAGNOSIS — F1414 Cocaine abuse with cocaine-induced mood disorder: Secondary | ICD-10-CM | POA: Diagnosis present

## 2017-04-29 DIAGNOSIS — F333 Major depressive disorder, recurrent, severe with psychotic symptoms: Secondary | ICD-10-CM | POA: Insufficient documentation

## 2017-04-29 DIAGNOSIS — F1120 Opioid dependence, uncomplicated: Secondary | ICD-10-CM | POA: Diagnosis present

## 2017-04-29 DIAGNOSIS — Z79899 Other long term (current) drug therapy: Secondary | ICD-10-CM | POA: Insufficient documentation

## 2017-04-29 DIAGNOSIS — R45851 Suicidal ideations: Secondary | ICD-10-CM | POA: Insufficient documentation

## 2017-04-29 LAB — COMPREHENSIVE METABOLIC PANEL
ALT: 28 U/L (ref 14–54)
AST: 29 U/L (ref 15–41)
Albumin: 3.9 g/dL (ref 3.5–5.0)
Alkaline Phosphatase: 58 U/L (ref 38–126)
Anion gap: 6 (ref 5–15)
BUN: 9 mg/dL (ref 6–20)
CHLORIDE: 107 mmol/L (ref 101–111)
CO2: 25 mmol/L (ref 22–32)
CREATININE: 0.75 mg/dL (ref 0.44–1.00)
Calcium: 8.9 mg/dL (ref 8.9–10.3)
GFR calc Af Amer: >60 mL/min (ref >60)
GFR calc non Af Amer: >60 mL/min (ref >60)
Glucose, Bld: 111 mg/dL — ABNORMAL HIGH (ref 65–99)
POTASSIUM: 3.4 mmol/L — AB (ref 3.5–5.1)
SODIUM: 138 mmol/L (ref 135–145)
Total Bilirubin: 0.7 mg/dL (ref 0.3–1.2)
Total Protein: 6.9 g/dL (ref 6.5–8.1)

## 2017-04-29 LAB — I-STAT BETA HCG BLOOD, ED (MC, WL, AP ONLY)

## 2017-04-29 LAB — CBC
HEMATOCRIT: 36.9 % (ref 36.0–46.0)
HEMOGLOBIN: 12.6 g/dL (ref 12.0–15.0)
MCH: 29.6 pg (ref 26.0–34.0)
MCHC: 34.1 g/dL (ref 30.0–36.0)
MCV: 86.6 fL (ref 78.0–100.0)
Platelets: 388 10*3/uL (ref 150–400)
RBC: 4.26 MIL/uL (ref 3.87–5.11)
RDW: 14.1 % (ref 11.5–15.5)
WBC: 7.6 10*3/uL (ref 4.0–10.5)

## 2017-04-29 LAB — RAPID URINE DRUG SCREEN, HOSP PERFORMED
Amphetamines: NOT DETECTED
BARBITURATES: NOT DETECTED
Benzodiazepines: NOT DETECTED
Cocaine: POSITIVE — AB
Opiates: POSITIVE — AB
TETRAHYDROCANNABINOL: POSITIVE — AB

## 2017-04-29 LAB — ETHANOL

## 2017-04-29 MED ORDER — LOPERAMIDE HCL 2 MG PO CAPS: 2.0000 mg | ORAL_CAPSULE | ORAL | Status: DC | PRN

## 2017-04-29 MED ORDER — TRAZODONE HCL 50 MG PO TABS
50.0000 mg | ORAL_TABLET | Freq: Every day | ORAL | Status: DC
  Administered 2017-04-29: 50 mg via ORAL
  Filled 2017-04-29: qty 1

## 2017-04-29 MED ORDER — METHOCARBAMOL 500 MG PO TABS
500.0000 mg | ORAL_TABLET | Freq: Three times a day (TID) | ORAL | Status: DC | PRN
  Administered 2017-04-29 – 2017-04-30 (×3): 500 mg via ORAL
  Filled 2017-04-29 (×3): qty 1

## 2017-04-29 MED ORDER — ONDANSETRON HCL 4 MG PO TABS: 4.0000 mg | ORAL_TABLET | Freq: Three times a day (TID) | ORAL | Status: DC | PRN

## 2017-04-29 MED ORDER — ONDANSETRON 4 MG PO TBDP: 4.0000 mg | ORAL_TABLET | Freq: Four times a day (QID) | ORAL | Status: DC | PRN

## 2017-04-29 MED ORDER — CLONIDINE HCL 0.1 MG PO TABS: 0.1000 mg | ORAL_TABLET | ORAL | Status: DC

## 2017-04-29 MED ORDER — NICOTINE 14 MG/24HR TD PT24
14.0000 mg | MEDICATED_PATCH | Freq: Every day | TRANSDERMAL | Status: DC
  Administered 2017-04-29 – 2017-04-30 (×2): 14 mg via TRANSDERMAL
  Filled 2017-04-29 (×2): qty 1

## 2017-04-29 MED ORDER — CLONIDINE HCL 0.1 MG PO TABS
0.1000 mg | ORAL_TABLET | Freq: Four times a day (QID) | ORAL | Status: DC
  Administered 2017-04-29 – 2017-04-30 (×4): 0.1 mg via ORAL
  Filled 2017-04-29 (×4): qty 1

## 2017-04-29 MED ORDER — DICYCLOMINE HCL 20 MG PO TABS: 20.0000 mg | ORAL_TABLET | Freq: Four times a day (QID) | ORAL | Status: DC | PRN

## 2017-04-29 MED ORDER — HYDROXYZINE HCL 25 MG PO TABS
25.0000 mg | ORAL_TABLET | Freq: Four times a day (QID) | ORAL | Status: DC | PRN
  Administered 2017-04-30 (×2): 25 mg via ORAL
  Filled 2017-04-29 (×2): qty 1

## 2017-04-29 MED ORDER — GABAPENTIN 300 MG PO CAPS
300.0000 mg | ORAL_CAPSULE | Freq: Two times a day (BID) | ORAL | Status: DC
  Administered 2017-04-29 – 2017-04-30 (×3): 300 mg via ORAL
  Filled 2017-04-29 (×3): qty 1

## 2017-04-29 MED ORDER — ALUM & MAG HYDROXIDE-SIMETH 200-200-20 MG/5ML PO SUSP: 30.0000 mL | Freq: Four times a day (QID) | ORAL | Status: DC | PRN

## 2017-04-29 MED ORDER — ACETAMINOPHEN 325 MG PO TABS
650.0000 mg | ORAL_TABLET | ORAL | Status: DC | PRN
  Administered 2017-04-29 – 2017-04-30 (×3): 650 mg via ORAL
  Filled 2017-04-29 (×4): qty 2

## 2017-04-29 MED ORDER — CLONIDINE HCL 0.1 MG PO TABS: 0.1000 mg | ORAL_TABLET | Freq: Every day | ORAL | Status: DC

## 2017-04-29 NOTE — ED Notes (Signed)
Patient restless not able to sit still.  She reports she is withdrawing from cocaine and heroin.  She says, "if they discharge me again, I will overdose."  Patient taking medications without difficulty and requesting food. Graham crackers and milk given to patient.

## 2017-04-29 NOTE — ED Notes (Addendum)
Made rounds on patient and she was sleeping with a bag of potato chips in her hand.  Half of food on tray had been eaten, so nurse removed tray and was going to give medications when patient woke up.  Patient woke up a few minutes later and became very agitated due to her tray being taken.  Patient also got impatient about not receiving any medication.  Patient given another sandwich to eat.  Medication given to patient and patient apologized saying she was sorry for the way she acted.
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
## 2017-04-29 NOTE — BH Assessment (Signed)
[facility] Assessment Progress Note  Case was staffed with Akintayo MD, who recommended patient be monitored for safety and observed. Patient will be seen by psychiatry in the a.m.

## 2017-04-29 NOTE — Patient Outreach (Signed)
Patient is still sleeping. This is the fourth time that CPSS has tried to meet with the patient where the patient was unable discuss inpatient substance use treatment options. Patient came into the ED requesting help with poly substance use and help getting her into inpatient substance use treatment. CPSS provided the same resources given to the patient on Wednesday 01/24/18. CPSS will follow up with the patient tomorrow morning when CPSS arrives at 11:00am.

## 2017-04-29 NOTE — ED Notes (Signed)
Patient is aware I need urine

## 2017-04-29 NOTE — Patient Outreach (Signed)
CPSS tried to meet with the patient and patient was very sleepy. When CPSS met with the patient earlier in the week on Wednesday 04/26/17, patient would not speak with CPSS because she was tired and just wanted to sleep. CPSS will try to meet with her and discuss substance use treatment options before 7:30pm.

## 2017-04-29 NOTE — ED Triage Notes (Signed)
Pt brought to ED by [employer] for polysubstance abuse  Along with suicidal ideation related to anxiety from substance abuse. Pt admit to crack use and heroin use over the last several days.

## 2017-04-29 NOTE — ED Notes (Signed)
Bed: [id number] Expected date:  Expected time:  Means of arrival:  Comments: [employer] with voluntary

## 2017-04-29 NOTE — ED Notes (Signed)
Pt asleep when writer took PRN medication
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
## 2017-04-29 NOTE — Patient Outreach (Signed)
CPSS tried to meet with the patient again. Patient was still very sleepy and tired. Patient stated that she did not use any of the substance use recovery resources given to her by CPSS on Wednesday 04/26/17. Patient stated that she is interested in detox/inpatient substance use treatment. Patient fell asleep half way through the assessment and asked for CPSS to come back later. This is the third time CPSS has met with the patient in the past 4 days where she has been not been able to participate in the assessment.  

## 2017-04-29 NOTE — ED Notes (Signed)
SBAR Report received from previous nurse. Pt received calm and visible on unit. Pt denies current SI/ HI, A/V H, depression, anxiety at this time, and appears otherwise stable and free of distress. Pt reminded of camera surveillance, q 15 min rounds, and rules of the milieu. Will continue to assess. 

## 2017-04-29 NOTE — ED Provider Notes (Signed)
Pt signed out by Dr. Preston FleetingGlick pending labs and disposition.  Pt's labs unremarkable other than drug screen + for opiates, cocaine, and marijuana.  TTS did see pt and recommend overnight observation with a reevaluation on 1/6 in the am.   Jacalyn LefevreHaviland, Alphonse Asbridge, MD 04/29/17 1228

## 2017-04-30 DIAGNOSIS — R4587 Impulsiveness: Secondary | ICD-10-CM

## 2017-04-30 DIAGNOSIS — F17210 Nicotine dependence, cigarettes, uncomplicated: Secondary | ICD-10-CM

## 2017-04-30 DIAGNOSIS — F1910 Other psychoactive substance abuse, uncomplicated: Secondary | ICD-10-CM

## 2017-04-30 DIAGNOSIS — R45851 Suicidal ideations: Secondary | ICD-10-CM

## 2017-04-30 DIAGNOSIS — F1210 Cannabis abuse, uncomplicated: Secondary | ICD-10-CM

## 2017-04-30 DIAGNOSIS — F1414 Cocaine abuse with cocaine-induced mood disorder: Secondary | ICD-10-CM
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
## 2017-04-30 NOTE — Discharge Instructions (Signed)
Follow up:  [facility] [address] [place], [place] [id number] [email or web address] [phone]

## 2017-04-30 NOTE — ED Notes (Signed)
Pt d/c home per MD order. This nurse to review pt discharge summary. Pt uninterested, verbally aggressive, threw discharge summary in the chair. Pt hyper focused on getting snacks and sandwiches from nursing staff. Pt not endorsing SI/HI/AVH. Personal property returned. Pt ambulatory off unit with [employer].

## 2017-04-30 NOTE — ED Notes (Signed)
Pt reports to this nurse "I would never hurt myself, I just said that so they would keep me, they always just let me go." Special checks q 15 mins in place for safety, Video monitoring in place. Will continue to monitor.
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
## 2017-06-15 ENCOUNTER — Other Ambulatory Visit: Payer: Self-pay

## 2017-06-15 ENCOUNTER — Emergency Department (HOSPITAL_COMMUNITY): Admission: EM | Admit: 2017-06-15 | Discharge: 2017-06-15 | Discharge status: Absent without leave | Payer: Self-pay

## 2017-06-15 ENCOUNTER — Encounter (HOSPITAL_COMMUNITY): Payer: Self-pay | Admitting: Emergency Medicine

## 2017-06-15 ENCOUNTER — Emergency Department (HOSPITAL_COMMUNITY)
Admission: EM | Admit: 2017-06-15 | Discharge: 2017-06-15 | Disposition: A | Discharge status: Home / Self Care | Payer: Self-pay | Attending: Emergency Medicine | Admitting: Emergency Medicine

## 2017-06-15 DIAGNOSIS — F329 Major depressive disorder, single episode, unspecified: Secondary | ICD-10-CM

## 2017-06-15 DIAGNOSIS — F17210 Nicotine dependence, cigarettes, uncomplicated: Secondary | ICD-10-CM | POA: Insufficient documentation

## 2017-06-15 DIAGNOSIS — F1910 Other psychoactive substance abuse, uncomplicated: Secondary | ICD-10-CM | POA: Insufficient documentation

## 2017-06-15 DIAGNOSIS — Z91040 Latex allergy status: Secondary | ICD-10-CM | POA: Insufficient documentation

## 2017-06-15 DIAGNOSIS — F1414 Cocaine abuse with cocaine-induced mood disorder: Secondary | ICD-10-CM | POA: Insufficient documentation

## 2017-06-15 LAB — COMPREHENSIVE METABOLIC PANEL
ALK PHOS: 76 U/L (ref 38–126)
ALT: 25 U/L (ref 14–54)
ANION GAP: 11 (ref 5–15)
AST: 36 U/L (ref 15–41)
Albumin: 4 g/dL (ref 3.5–5.0)
BILIRUBIN TOTAL: 0.3 mg/dL (ref 0.3–1.2)
BUN: 12 mg/dL (ref 6–20)
CALCIUM: 9.1 mg/dL (ref 8.9–10.3)
CO2: 23 mmol/L (ref 22–32)
CREATININE: 0.89 mg/dL (ref 0.44–1.00)
Chloride: 107 mmol/L (ref 101–111)
GFR calc non Af Amer: >60 mL/min (ref >60)
Glucose, Bld: 131 mg/dL — ABNORMAL HIGH (ref 65–99)
Potassium: 3.6 mmol/L (ref 3.5–5.1)
SODIUM: 141 mmol/L (ref 135–145)
TOTAL PROTEIN: 7.1 g/dL (ref 6.5–8.1)

## 2017-06-15 LAB — URINALYSIS, ROUTINE W REFLEX MICROSCOPIC
Bilirubin Urine: NEGATIVE
Glucose, UA: NEGATIVE mg/dL
Ketones, ur: 5 mg/dL — AB
Nitrite: NEGATIVE
PROTEIN: NEGATIVE mg/dL
Specific Gravity, Urine: 1.016 (ref 1.005–1.030)
pH: 6 (ref 5.0–8.0)

## 2017-06-15 LAB — CBC WITH DIFFERENTIAL/PLATELET
BASOS PCT: 0 %
Basophils Absolute: 0 10*3/uL (ref 0.0–0.1)
EOS ABS: 0.2 10*3/uL (ref 0.0–0.7)
Eosinophils Relative: 2 %
HCT: 36.7 % (ref 36.0–46.0)
Hemoglobin: 12.8 g/dL (ref 12.0–15.0)
Lymphocytes Relative: 21 %
Lymphs Abs: 2.2 10*3/uL (ref 0.7–4.0)
MCH: 30.9 pg (ref 26.0–34.0)
MCHC: 34.9 g/dL (ref 30.0–36.0)
MCV: 88.6 fL (ref 78.0–100.0)
MONO ABS: 0.6 10*3/uL (ref 0.1–1.0)
Monocytes Relative: 6 %
NEUTROS ABS: 7.7 10*3/uL (ref 1.7–7.7)
NEUTROS PCT: 71 %
PLATELETS: 500 10*3/uL — AB (ref 150–400)
RBC: 4.14 MIL/uL (ref 3.87–5.11)
RDW: 14.5 % (ref 11.5–15.5)
WBC: 10.7 10*3/uL — ABNORMAL HIGH (ref 4.0–10.5)

## 2017-06-15 LAB — I-STAT BETA HCG BLOOD, ED (MC, WL, AP ONLY): I-stat hCG, quantitative: <5 m[IU]/mL (ref <5)

## 2017-06-15 LAB — RAPID URINE DRUG SCREEN, HOSP PERFORMED
AMPHETAMINES: NOT DETECTED
Barbiturates: NOT DETECTED
Benzodiazepines: NOT DETECTED
COCAINE: POSITIVE — AB
Opiates: POSITIVE — AB
Tetrahydrocannabinol: POSITIVE — AB

## 2017-06-15 LAB — ETHANOL

## 2017-06-15 LAB — LIPASE, BLOOD: LIPASE: 34 U/L (ref 11–51)

## 2017-06-15 MED ORDER — ACETAMINOPHEN 500 MG PO TABS
1000.0000 mg | ORAL_TABLET | Freq: Once | ORAL | Status: AC
  Administered 2017-06-15: 1000 mg via ORAL
  Filled 2017-06-15: qty 2

## 2017-06-15 MED ORDER — LORAZEPAM 2 MG/ML IJ SOLN: 1.0000 mg | Freq: Once | INTRAMUSCULAR | Status: DC

## 2017-06-15 MED ORDER — NICOTINE 14 MG/24HR TD PT24: 14.0000 mg | MEDICATED_PATCH | Freq: Every day | TRANSDERMAL | Status: DC
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
## 2017-06-15 NOTE — ED Notes (Signed)
Bed: WA33 Expected date:  Expected time:  Means of arrival:  Comments: Hold for 25 

## 2017-06-15 NOTE — BH Assessment (Addendum)
[facility]  Per Juanetta BeetsJacqueline Norman, DO, this pt does not require psychiatric hospitalization at this time.  [facility].  This has been included in pt's discharge instructions.  Pt would also benefit from seeing Peer Support Specialists; they will be asked to speak to pt.  Pt's nurse has been notified.  Kathy Canninghomas Graysyn Bache, MA Triage Specialist [phone]   Addendum:  After discussion with [name] with Peer Support, it has been found that pt does not want substance abuse treatment referrals at this time.  This Clinical research associatewriter changed discharge instructions, replacing [facility].  Pt's nurse has been notified.  [name], [place] Behavioral Health Coordinator [phone]

## 2017-06-15 NOTE — Discharge Instructions (Signed)
For your behavioral health needs you are advised to follow up with [facility].  New patients are seen at their walk-in clinic.  Walk-in hours are Monday - Friday from 8:00 am - 12:00 pm, and from 1:00 pm - 3:00 pm.  Walk-in patients are seen on a first come, first served basis, so try to arrive as early as possible for the best chance of being seen the same day.  There is an initial fee of $22.50: ° °     [facility] °     [address] °     , [place] [address] °     [phone] °

## 2017-06-15 NOTE — Patient Outreach (Signed)
ED Peer Support Specialist Patient Intake (Complete at intake & 30-60 Day Follow-up)  Name: Kathy ParsonsSarah D [name]  MRN: [id number]  Age: 32 y.o.   Date of Admission: 06/15/2017  Intake: Initial Comments:      Primary Reason Admitted:depression, anxiety, poly substance use with cocaine, marijuana, and heroin  Lab values: Alcohol/ETOH: Negative Positive UDS? Yes Amphetamines: No Barbiturates: No Benzodiazepines: No Cocaine: Yes Opiates: Yes Cannabinoids: Yes  Demographic information: Gender: Female Ethnicity: African American Marital Status:   Insurance Status: Medicaid Control and instrumentation engineereceives non-medical governmental assistance (Work Engineer, agriculturalirst/Welfare, Sales executivefood stamps, etc.:   Lives with:   Living situation:    Reported Patient History: Patient reported health conditions:   Patient aware of HIV and hepatitis status:    In past year, has patient visited ED for any reason?    Number of ED visits:    Reason(s) for visit:    In past year, has patient been hospitalized for any reason?    Number of hospitalizations:    Reason(s) for hospitalization:    In past year, has patient been arrested?    Number of arrests:    Reason(s) for arrest:    In past year, has patient been incarcerated?    Number of incarcerations:    Reason(s) for incarceration:    In past year, has patient received medication-assisted treatment?    In past year, patient received the following treatments:    In past year, has patient received any harm reduction services?    Did this include any of the following?    In past year, has patient received care from a mental health provider for diagnosis other than SUD?    In past year, is this first time patient has overdosed?    Number of past overdoses:    In past year, is this first time patient has been hospitalized for an overdose?    Number of hospitalizations for overdose(s):    Is patient currently receiving treatment for a mental health diagnosis?    Patient reports  experiencing difficulty participating in SUD treatment:      Most important reason(s) for this difficulty?    Has patient received prior services for treatment?    In past, patient has received services from following agencies:    Plan of Care:  Suggested follow up at these agencies/treatment centers:    Other information: [facility] tried meeting with the patient, but the patient was too sleepy and did not want to talk to [facility]. [facility] tried meeting with the patient as well on 04/29/17 and 04/26/17 where the patient was too sleepy and did not want to talk with [facility]. The patient stated that she was not interested in help with substance use treatment resources. [facility] still provided a resource for [facility] and talked to the patient about the services they provide. [facility] also provided an NA meeting list, residential/outpatient substance use treatment services, and [facility] contact information. [facility] encouraged the patient to contact [facility] at anytime for substance use recovery support.    Bartholomew BoardsJohn Jera Headings, [facility]  06/15/2017 11:31 AM

## 2017-06-15 NOTE — Progress Notes (Signed)
Discharge instructions given. Pt verbalized understanding and all questions were answered.  
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
## 2017-06-29 ENCOUNTER — Emergency Department (HOSPITAL_COMMUNITY): Payer: Self-pay

## 2017-06-29 ENCOUNTER — Emergency Department (HOSPITAL_COMMUNITY)
Admission: EM | Admit: 2017-06-29 | Discharge: 2017-06-29 | Disposition: A | Discharge status: Home / Self Care | Payer: Self-pay | Attending: Emergency Medicine | Admitting: Emergency Medicine

## 2017-06-29 ENCOUNTER — Encounter (HOSPITAL_COMMUNITY): Payer: Self-pay

## 2017-06-29 DIAGNOSIS — Z91040 Latex allergy status: Secondary | ICD-10-CM | POA: Insufficient documentation

## 2017-06-29 DIAGNOSIS — F17210 Nicotine dependence, cigarettes, uncomplicated: Secondary | ICD-10-CM | POA: Insufficient documentation

## 2017-06-29 DIAGNOSIS — F32A Depression, unspecified: Secondary | ICD-10-CM

## 2017-06-29 DIAGNOSIS — R4689 Other symptoms and signs involving appearance and behavior: Secondary | ICD-10-CM | POA: Insufficient documentation

## 2017-06-29 DIAGNOSIS — R45851 Suicidal ideations: Secondary | ICD-10-CM | POA: Insufficient documentation

## 2017-06-29 DIAGNOSIS — F1410 Cocaine abuse, uncomplicated: Secondary | ICD-10-CM

## 2017-06-29 DIAGNOSIS — F329 Major depressive disorder, single episode, unspecified: Secondary | ICD-10-CM | POA: Insufficient documentation

## 2017-06-29 DIAGNOSIS — F1414 Cocaine abuse with cocaine-induced mood disorder: Secondary | ICD-10-CM | POA: Insufficient documentation

## 2017-06-29 DIAGNOSIS — R51 Headache: Secondary | ICD-10-CM

## 2017-06-29 DIAGNOSIS — R519 Headache, unspecified: Secondary | ICD-10-CM

## 2017-06-29 LAB — CBC
HCT: 35.8 % — ABNORMAL LOW (ref 36.0–46.0)
HEMOGLOBIN: 12.3 g/dL (ref 12.0–15.0)
MCH: 30.1 pg (ref 26.0–34.0)
MCHC: 34.4 g/dL (ref 30.0–36.0)
MCV: 87.5 fL (ref 78.0–100.0)
PLATELETS: 414 10*3/uL — AB (ref 150–400)
RBC: 4.09 MIL/uL (ref 3.87–5.11)
RDW: 14.4 % (ref 11.5–15.5)
WBC: 17.3 10*3/uL — ABNORMAL HIGH (ref 4.0–10.5)

## 2017-06-29 LAB — COMPREHENSIVE METABOLIC PANEL
ALK PHOS: 69 U/L (ref 38–126)
ALT: 23 U/L (ref 14–54)
ANION GAP: 8 (ref 5–15)
AST: 44 U/L — ABNORMAL HIGH (ref 15–41)
Albumin: 4 g/dL (ref 3.5–5.0)
BILIRUBIN TOTAL: 0.5 mg/dL (ref 0.3–1.2)
BUN: 17 mg/dL (ref 6–20)
CALCIUM: 8.9 mg/dL (ref 8.9–10.3)
CO2: 25 mmol/L (ref 22–32)
CREATININE: 0.77 mg/dL (ref 0.44–1.00)
Chloride: 107 mmol/L (ref 101–111)
GFR calc Af Amer: >60 mL/min (ref >60)
Glucose, Bld: 104 mg/dL — ABNORMAL HIGH (ref 65–99)
Potassium: 4 mmol/L (ref 3.5–5.1)
Sodium: 140 mmol/L (ref 135–145)
TOTAL PROTEIN: 7.2 g/dL (ref 6.5–8.1)

## 2017-06-29 LAB — I-STAT BETA HCG BLOOD, ED (MC, WL, AP ONLY): I-stat hCG, quantitative: <5 m[IU]/mL (ref <5)

## 2017-06-29 LAB — RAPID URINE DRUG SCREEN, HOSP PERFORMED
Amphetamines: NOT DETECTED
Barbiturates: NOT DETECTED
Benzodiazepines: NOT DETECTED
Cocaine: POSITIVE — AB
OPIATES: POSITIVE — AB
TETRAHYDROCANNABINOL: POSITIVE — AB

## 2017-06-29 LAB — ETHANOL

## 2017-06-29 LAB — SALICYLATE LEVEL: Salicylate Lvl: <7 mg/dL (ref 2.8–30.0)

## 2017-06-29 LAB — ACETAMINOPHEN LEVEL

## 2017-06-29 MED ORDER — ACETAMINOPHEN 500 MG PO TABS: 1000.0000 mg | ORAL_TABLET | Freq: Four times a day (QID) | ORAL | Status: DC | PRN

## 2017-06-29 MED ORDER — PROMETHAZINE HCL 25 MG/ML IJ SOLN
25.0000 mg | Freq: Once | INTRAMUSCULAR | Status: AC
  Administered 2017-06-29: 25 mg via INTRAMUSCULAR
  Filled 2017-06-29: qty 1

## 2017-06-29 MED ORDER — ACETAMINOPHEN 500 MG PO TABS
1000.0000 mg | ORAL_TABLET | Freq: Once | ORAL | Status: DC
Start: 2017-06-29 — End: 2017-06-29

## 2017-06-29 NOTE — ED Notes (Signed)
Bed: WTR5 Expected date:  Expected time:  Means of arrival:  Comments: 

## 2017-06-29 NOTE — ED Notes (Signed)
TCS came by to speak with pt, pt is still hard to wake up.
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
## 2017-06-29 NOTE — ED Notes (Signed)
Bed: WA27 Expected date:  Expected time:  Means of arrival:  Comments: 

## 2017-06-29 NOTE — Discharge Instructions (Signed)
For your behavioral health needs you are advised to follow up with [facility].  New patients are seen at their walk-in clinic.  Walk-in hours are Monday - Friday from 8:00 am - 12:00 pm, and from 1:00 pm - 3:00 pm.  Walk-in patients are seen on a first come, first served basis, so try to arrive as early as possible for the best chance of being seen the same day.  There is an initial fee of $22.50: ° °     [facility] °     [address] °     [place], [place] [address] °     [phone] °

## 2017-06-29 NOTE — BHH Counselor (Signed)
TTS writer attempted to assess patient. Patient continues to be hard to awake. TTS writer also attempted to assess patient earlier in the morning, during which time patient was also hard to awaken. TTS writer will attempt to assist patient later this date.

## 2017-06-29 NOTE — ED Notes (Signed)
Pt states that she had a migraine and fioricet helps them
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
## 2017-06-29 NOTE — Patient Outreach (Signed)
[facility] tried speaking with the patient to provide substance use recovery support and help with substance use recovery resources. Patient is very sleepy and does not want to speak with [facility]. [facility] has met with the patient on 04/26/17, 04/29/17, and 06/15/17 where the patient presented the same way where she is  too sleepy, not wanting to talk to [place]. [facility] still provided a resource for [facility] and talked to the patient about the services they provide. [facility]PSS also provided an NA meeting list, residential/outpatient substance use treatment services, and [facility] contact information. [facility] encouraged the patient to contact [facility] at anytime for substance use recovery support or help with resources.

## 2017-06-29 NOTE — ED Notes (Signed)
TTS came to talk to patient. Reports patient to out of it and cant assess at this time

## 2017-06-29 NOTE — ED Notes (Signed)
Dr Lynelle DoctorKnapp went to see the patient and she was trying to ask questions and exam the patient and everytime the patient answered Dr Lynelle DoctorKnapp she was raising her voice and not answering the questions she was asking.

## 2017-06-29 NOTE — BH Assessment (Signed)
[facility] Assessment Progress Note  Per Jamison Lord, DNP, this pt does not require psychiatric hospitalization at this time.  Pt is to be discharged from [facility] with recommendation to follow up with [facility].  This has been included in pt's discharge instructions.  Pt's nurse has been notified.  Maddyson Keil, MA Triage Specialist [phone]     

## 2017-06-30 ENCOUNTER — Encounter (HOSPITAL_COMMUNITY): Payer: Self-pay | Admitting: Emergency Medicine

## 2017-06-30 ENCOUNTER — Emergency Department (HOSPITAL_COMMUNITY)
Admission: EM | Admit: 2017-06-30 | Discharge: 2017-06-30 | Disposition: A | Discharge status: Home / Self Care | Payer: Medicaid Other | Attending: Emergency Medicine | Admitting: Emergency Medicine

## 2017-06-30 DIAGNOSIS — F1410 Cocaine abuse, uncomplicated: Secondary | ICD-10-CM | POA: Insufficient documentation

## 2017-06-30 DIAGNOSIS — F1210 Cannabis abuse, uncomplicated: Secondary | ICD-10-CM | POA: Insufficient documentation

## 2017-06-30 DIAGNOSIS — F419 Anxiety disorder, unspecified: Secondary | ICD-10-CM | POA: Insufficient documentation

## 2017-06-30 DIAGNOSIS — D573 Sickle-cell trait: Secondary | ICD-10-CM | POA: Insufficient documentation

## 2017-06-30 DIAGNOSIS — F17210 Nicotine dependence, cigarettes, uncomplicated: Secondary | ICD-10-CM | POA: Insufficient documentation

## 2017-06-30 DIAGNOSIS — G2402 Drug induced acute dystonia: Secondary | ICD-10-CM | POA: Insufficient documentation

## 2017-06-30 DIAGNOSIS — F410 Panic disorder [episodic paroxysmal anxiety] without agoraphobia: Secondary | ICD-10-CM

## 2017-06-30 MED ORDER — DIPHENHYDRAMINE HCL 50 MG/ML IJ SOLN
50.0000 mg | Freq: Once | INTRAMUSCULAR | Status: AC
  Administered 2017-06-30: 50 mg via INTRAMUSCULAR
  Filled 2017-06-30: qty 1

## 2017-06-30 MED ORDER — DIPHENHYDRAMINE HCL 50 MG/ML IJ SOLN: 50.0000 mg | Freq: Once | INTRAMUSCULAR | Status: DC

## 2017-06-30 MED ORDER — DIPHENHYDRAMINE HCL 25 MG PO TABS: ORAL_TABLET | ORAL | 0 refills | Status: DC

## 2017-06-30 NOTE — ED Triage Notes (Signed)
Patient c/o that she cant breath and that her tongue is swelling.  Patient reports she picked up a cigarette off the ground and smoked it and symptoms started shortly after. Patient restless and yelling out that she cant breath. Patient able to speak in full sentences. Pt 99% O2 on room air. Pat placed on 2l O2 for comfort.
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
## 2017-07-24 ENCOUNTER — Other Ambulatory Visit: Payer: Self-pay

## 2017-07-24 ENCOUNTER — Emergency Department (HOSPITAL_COMMUNITY)
Admission: EM | Admit: 2017-07-24 | Discharge: 2017-07-24 | Disposition: A | Discharge status: Home / Self Care | Payer: Medicaid Other | Attending: Emergency Medicine | Admitting: Emergency Medicine

## 2017-07-24 ENCOUNTER — Encounter (HOSPITAL_COMMUNITY): Payer: Self-pay | Admitting: Nurse Practitioner

## 2017-07-24 DIAGNOSIS — F1110 Opioid abuse, uncomplicated: Secondary | ICD-10-CM | POA: Insufficient documentation

## 2017-07-24 DIAGNOSIS — Z91040 Latex allergy status: Secondary | ICD-10-CM | POA: Insufficient documentation

## 2017-07-24 DIAGNOSIS — F1210 Cannabis abuse, uncomplicated: Secondary | ICD-10-CM | POA: Insufficient documentation

## 2017-07-24 DIAGNOSIS — F1410 Cocaine abuse, uncomplicated: Secondary | ICD-10-CM | POA: Insufficient documentation

## 2017-07-24 DIAGNOSIS — F1910 Other psychoactive substance abuse, uncomplicated: Secondary | ICD-10-CM | POA: Insufficient documentation

## 2017-07-24 DIAGNOSIS — F17210 Nicotine dependence, cigarettes, uncomplicated: Secondary | ICD-10-CM | POA: Insufficient documentation

## 2017-07-24 LAB — COMPREHENSIVE METABOLIC PANEL
ALBUMIN: 4.4 g/dL (ref 3.5–5.0)
ALT: 27 U/L (ref 14–54)
ANION GAP: 11 (ref 5–15)
AST: 39 U/L (ref 15–41)
Alkaline Phosphatase: 86 U/L (ref 38–126)
BUN: 20 mg/dL (ref 6–20)
CO2: 24 mmol/L (ref 22–32)
Calcium: 9.4 mg/dL (ref 8.9–10.3)
Chloride: 107 mmol/L (ref 101–111)
Creatinine, Ser: 0.85 mg/dL (ref 0.44–1.00)
GFR calc Af Amer: >60 mL/min (ref >60)
GFR calc non Af Amer: >60 mL/min (ref >60)
GLUCOSE: 69 mg/dL (ref 65–99)
POTASSIUM: 3.8 mmol/L (ref 3.5–5.1)
SODIUM: 142 mmol/L (ref 135–145)
TOTAL PROTEIN: 7.9 g/dL (ref 6.5–8.1)
Total Bilirubin: 0.8 mg/dL (ref 0.3–1.2)

## 2017-07-24 LAB — RAPID URINE DRUG SCREEN, HOSP PERFORMED
Amphetamines: POSITIVE — AB
BARBITURATES: NOT DETECTED
Benzodiazepines: NOT DETECTED
Cocaine: POSITIVE — AB
Opiates: POSITIVE — AB
Tetrahydrocannabinol: POSITIVE — AB

## 2017-07-24 LAB — CBC
HEMATOCRIT: 36.4 % (ref 36.0–46.0)
HEMOGLOBIN: 12.8 g/dL (ref 12.0–15.0)
MCH: 30.5 pg (ref 26.0–34.0)
MCHC: 35.2 g/dL (ref 30.0–36.0)
MCV: 86.9 fL (ref 78.0–100.0)
Platelets: 435 10*3/uL — ABNORMAL HIGH (ref 150–400)
RBC: 4.19 MIL/uL (ref 3.87–5.11)
RDW: 14.1 % (ref 11.5–15.5)
WBC: 15 10*3/uL — AB (ref 4.0–10.5)

## 2017-07-24 LAB — ACETAMINOPHEN LEVEL: Acetaminophen (Tylenol), Serum: <10 ug/mL — ABNORMAL LOW (ref 10–30)

## 2017-07-24 LAB — I-STAT BETA HCG BLOOD, ED (MC, WL, AP ONLY): I-stat hCG, quantitative: <5 m[IU]/mL (ref <5)

## 2017-07-24 LAB — SALICYLATE LEVEL: Salicylate Lvl: <7 mg/dL (ref 2.8–30.0)

## 2017-07-24 LAB — ETHANOL: Alcohol, Ethyl (B): <10 mg/dL (ref <10)

## 2017-07-24 NOTE — Discharge Instructions (Addendum)
Follow up community mental health resources °

## 2017-07-24 NOTE — ED Notes (Signed)
PATIENT WAS MEDICALLY CLEARED AND [employer] TOOK HER BACK TO JAIL

## 2017-07-24 NOTE — ED Notes (Signed)
GAVE PATIENT 4 PACKS OF GRAHAM CRACKERS

## 2017-07-24 NOTE — ED Triage Notes (Signed)
[facility] will [facility] with [employer] right after cleared. Patient took several types of drugs she states.

## 2017-07-24 NOTE — ED Notes (Signed)
Bed: [id number] Expected date:  Expected time:  Means of arrival:  Comments: Medical Clearance
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
## 2017-08-07 ENCOUNTER — Encounter (HOSPITAL_COMMUNITY): Payer: Self-pay | Admitting: Emergency Medicine

## 2017-08-07 ENCOUNTER — Encounter (HOSPITAL_COMMUNITY): Payer: Self-pay

## 2017-08-07 ENCOUNTER — Emergency Department (HOSPITAL_COMMUNITY)
Admission: EM | Admit: 2017-08-07 | Discharge: 2017-08-07 | Disposition: A | Discharge status: Home / Self Care | Payer: Medicaid Other | Attending: Emergency Medicine | Admitting: Emergency Medicine

## 2017-08-07 ENCOUNTER — Emergency Department (HOSPITAL_COMMUNITY)
Admission: EM | Admit: 2017-08-07 | Discharge: 2017-08-08 | Disposition: A | Discharge status: Other Acute Inpatient Hospital | Payer: Medicaid Other | Attending: Emergency Medicine | Admitting: Emergency Medicine

## 2017-08-07 ENCOUNTER — Other Ambulatory Visit: Payer: Self-pay

## 2017-08-07 DIAGNOSIS — F1220 Cannabis dependence, uncomplicated: Secondary | ICD-10-CM | POA: Insufficient documentation

## 2017-08-07 DIAGNOSIS — R45851 Suicidal ideations: Secondary | ICD-10-CM | POA: Insufficient documentation

## 2017-08-07 DIAGNOSIS — Z91040 Latex allergy status: Secondary | ICD-10-CM | POA: Insufficient documentation

## 2017-08-07 DIAGNOSIS — F1420 Cocaine dependence, uncomplicated: Secondary | ICD-10-CM | POA: Insufficient documentation

## 2017-08-07 DIAGNOSIS — F1910 Other psychoactive substance abuse, uncomplicated: Secondary | ICD-10-CM | POA: Insufficient documentation

## 2017-08-07 DIAGNOSIS — F1120 Opioid dependence, uncomplicated: Secondary | ICD-10-CM | POA: Insufficient documentation

## 2017-08-07 DIAGNOSIS — F1994 Other psychoactive substance use, unspecified with psychoactive substance-induced mood disorder: Secondary | ICD-10-CM

## 2017-08-07 DIAGNOSIS — F329 Major depressive disorder, single episode, unspecified: Secondary | ICD-10-CM | POA: Insufficient documentation

## 2017-08-07 DIAGNOSIS — F17210 Nicotine dependence, cigarettes, uncomplicated: Secondary | ICD-10-CM | POA: Insufficient documentation

## 2017-08-07 DIAGNOSIS — K0889 Other specified disorders of teeth and supporting structures: Secondary | ICD-10-CM

## 2017-08-07 DIAGNOSIS — F4310 Post-traumatic stress disorder, unspecified: Secondary | ICD-10-CM | POA: Insufficient documentation

## 2017-08-07 DIAGNOSIS — R1084 Generalized abdominal pain: Secondary | ICD-10-CM | POA: Insufficient documentation

## 2017-08-07 DIAGNOSIS — F332 Major depressive disorder, recurrent severe without psychotic features: Secondary | ICD-10-CM | POA: Insufficient documentation

## 2017-08-07 DIAGNOSIS — R1030 Lower abdominal pain, unspecified: Secondary | ICD-10-CM | POA: Insufficient documentation

## 2017-08-07 LAB — COMPREHENSIVE METABOLIC PANEL
ALBUMIN: 3.9 g/dL (ref 3.5–5.0)
ALK PHOS: 63 U/L (ref 38–126)
ALK PHOS: 70 U/L (ref 38–126)
ALT: 20 U/L (ref 14–54)
ALT: 18 U/L (ref 14–54)
ANION GAP: 8 (ref 5–15)
AST: 26 U/L (ref 15–41)
AST: 22 U/L (ref 15–41)
Albumin: 3.6 g/dL (ref 3.5–5.0)
Anion gap: 9 (ref 5–15)
BILIRUBIN TOTAL: 0.7 mg/dL (ref 0.3–1.2)
BILIRUBIN TOTAL: 0.5 mg/dL (ref 0.3–1.2)
BUN: 15 mg/dL (ref 6–20)
BUN: 18 mg/dL (ref 6–20)
CALCIUM: 9.3 mg/dL (ref 8.9–10.3)
CALCIUM: 8.8 mg/dL — AB (ref 8.9–10.3)
CHLORIDE: 106 mmol/L (ref 101–111)
CO2: 27 mmol/L (ref 22–32)
CO2: 22 mmol/L (ref 22–32)
CREATININE: 0.81 mg/dL (ref 0.44–1.00)
Chloride: 107 mmol/L (ref 101–111)
Creatinine, Ser: 0.78 mg/dL (ref 0.44–1.00)
GFR calc Af Amer: >60 mL/min (ref >60)
GFR calc Af Amer: >60 mL/min (ref >60)
GFR calc non Af Amer: >60 mL/min (ref >60)
GLUCOSE: 105 mg/dL — AB (ref 65–99)
Glucose, Bld: 92 mg/dL (ref 65–99)
Potassium: 3.6 mmol/L (ref 3.5–5.1)
Potassium: 4.3 mmol/L (ref 3.5–5.1)
Sodium: 142 mmol/L (ref 135–145)
Sodium: 137 mmol/L (ref 135–145)
Total Protein: 7.3 g/dL (ref 6.5–8.1)
Total Protein: 6.4 g/dL — ABNORMAL LOW (ref 6.5–8.1)

## 2017-08-07 LAB — RAPID URINE DRUG SCREEN, HOSP PERFORMED
AMPHETAMINES: NOT DETECTED
Amphetamines: NOT DETECTED
BARBITURATES: NOT DETECTED
Barbiturates: NOT DETECTED
Benzodiazepines: NOT DETECTED
Benzodiazepines: NOT DETECTED
Cocaine: POSITIVE — AB
Cocaine: POSITIVE — AB
Opiates: POSITIVE — AB
Opiates: POSITIVE — AB
Tetrahydrocannabinol: POSITIVE — AB
Tetrahydrocannabinol: NOT DETECTED

## 2017-08-07 LAB — CBC
HCT: 37.7 % (ref 36.0–46.0)
HEMATOCRIT: 37.1 % (ref 36.0–46.0)
Hemoglobin: 12.3 g/dL (ref 12.0–15.0)
Hemoglobin: 12.8 g/dL (ref 12.0–15.0)
MCH: 29.4 pg (ref 26.0–34.0)
MCH: 29.4 pg (ref 26.0–34.0)
MCHC: 33.2 g/dL (ref 30.0–36.0)
MCHC: 34 g/dL (ref 30.0–36.0)
MCV: 88.5 fL (ref 78.0–100.0)
MCV: 86.5 fL (ref 78.0–100.0)
PLATELETS: 421 10*3/uL — AB (ref 150–400)
Platelets: 437 10*3/uL — ABNORMAL HIGH (ref 150–400)
RBC: 4.19 MIL/uL (ref 3.87–5.11)
RBC: 4.36 MIL/uL (ref 3.87–5.11)
RDW: 14.2 % (ref 11.5–15.5)
RDW: 14 % (ref 11.5–15.5)
WBC: 11.2 10*3/uL — ABNORMAL HIGH (ref 4.0–10.5)
WBC: 5.4 10*3/uL (ref 4.0–10.5)

## 2017-08-07 LAB — ACETAMINOPHEN LEVEL: Acetaminophen (Tylenol), Serum: <10 ug/mL — ABNORMAL LOW (ref 10–30)

## 2017-08-07 LAB — ETHANOL: Alcohol, Ethyl (B): <10 mg/dL (ref <10)

## 2017-08-07 LAB — SALICYLATE LEVEL
Salicylate Lvl: <7 mg/dL (ref 2.8–30.0)
Salicylate Lvl: <7 mg/dL (ref 2.8–30.0)

## 2017-08-07 LAB — I-STAT BETA HCG BLOOD, ED (MC, WL, AP ONLY)

## 2017-08-07 MED ORDER — AMOXICILLIN 500 MG PO CAPS
500.0000 mg | ORAL_CAPSULE | Freq: Three times a day (TID) | ORAL | Status: DC
  Administered 2017-08-07 – 2017-08-08 (×3): 500 mg via ORAL
  Filled 2017-08-07 (×3): qty 1

## 2017-08-07 MED ORDER — ONDANSETRON HCL 4 MG PO TABS: 4.0000 mg | ORAL_TABLET | Freq: Three times a day (TID) | ORAL | Status: DC | PRN

## 2017-08-07 MED ORDER — CLONIDINE HCL 0.2 MG PO TABS
0.1000 mg | ORAL_TABLET | Freq: Once | ORAL | Status: AC
  Administered 2017-08-07: 0.1 mg via ORAL
  Filled 2017-08-07: qty 1

## 2017-08-07 MED ORDER — ACETAMINOPHEN 500 MG PO TABS
1000.0000 mg | ORAL_TABLET | Freq: Three times a day (TID) | ORAL | Status: DC | PRN
  Administered 2017-08-07 – 2017-08-08 (×2): 1000 mg via ORAL
  Filled 2017-08-07 (×2): qty 2

## 2017-08-07 MED ORDER — NICOTINE 21 MG/24HR TD PT24
21.0000 mg | MEDICATED_PATCH | Freq: Every day | TRANSDERMAL | Status: DC
  Administered 2017-08-07 – 2017-08-08 (×2): 21 mg via TRANSDERMAL
  Filled 2017-08-07 (×2): qty 1

## 2017-08-07 NOTE — ED Notes (Signed)
Pt discharged home. Discharged instructions read to pt who verbalized understanding. All belongings returned to pt. Not delusional and not responding to internal stimuli. Pt upset because, "I have no place to go." Security required to escorted pt to the ED exit.

## 2017-08-07 NOTE — ED Triage Notes (Signed)
Per EMS- Patient was found behind a gas station with no clothes on. Heroin use. Patient stated, "I don't want to live anymore." Patient also c/o abdominal pain.

## 2017-08-07 NOTE — ED Notes (Signed)
Bed: WTR5 Expected date:  Expected time:  Means of arrival:  Comments: 

## 2017-08-07 NOTE — ED Notes (Signed)
Pt has eaten two meals, and is currently in bed asleep.

## 2017-08-07 NOTE — ED Notes (Signed)
Pt crying out in pain in triage, reports bad teeth. Allergic to meds for pain in ed triage acute pain protocol, attempted to call MD for orders but no answer. Will try again later.

## 2017-08-07 NOTE — ED Notes (Signed)
This Clinical research associatewriter attempt to info PT of lab work. PT did not response when asked if ok to collect labs. PT unable to remain put while on bed.
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
## 2017-08-07 NOTE — ED Notes (Signed)
Placed in paper scrubs, wanded by security. Belongings at nurses station.

## 2017-08-07 NOTE — ED Triage Notes (Signed)
Pt arrives with [employer] after being IVC'd by Monday for suicidal ideation. [facility] and [facility]. [facility] and told them that she wants to walk in front of traffic to kill self.

## 2017-08-07 NOTE — ED Notes (Signed)
Heating pack given

## 2017-08-08 ENCOUNTER — Other Ambulatory Visit: Payer: Self-pay

## 2017-08-08 ENCOUNTER — Encounter (HOSPITAL_COMMUNITY): Payer: Self-pay | Admitting: *Deleted

## 2017-08-08 ENCOUNTER — Inpatient Hospital Stay (HOSPITAL_COMMUNITY)
Admission: AD | Admit: 2017-08-08 | Discharge: 2017-08-13 | DRG: 885 | Disposition: A | Discharge status: Home / Self Care | Payer: No Typology Code available for payment source | Attending: Psychiatry | Admitting: Psychiatry

## 2017-08-08 DIAGNOSIS — F1120 Opioid dependence, uncomplicated: Secondary | ICD-10-CM | POA: Diagnosis present

## 2017-08-08 DIAGNOSIS — F419 Anxiety disorder, unspecified: Secondary | ICD-10-CM | POA: Diagnosis present

## 2017-08-08 DIAGNOSIS — Z765 Malingerer [conscious simulation]: Secondary | ICD-10-CM | POA: Diagnosis not present

## 2017-08-08 DIAGNOSIS — Z91040 Latex allergy status: Secondary | ICD-10-CM

## 2017-08-08 DIAGNOSIS — G4700 Insomnia, unspecified: Secondary | ICD-10-CM | POA: Diagnosis not present

## 2017-08-08 DIAGNOSIS — F17210 Nicotine dependence, cigarettes, uncomplicated: Secondary | ICD-10-CM | POA: Diagnosis present

## 2017-08-08 DIAGNOSIS — Z888 Allergy status to other drugs, medicaments and biological substances status: Secondary | ICD-10-CM | POA: Diagnosis not present

## 2017-08-08 DIAGNOSIS — R51 Headache: Secondary | ICD-10-CM | POA: Diagnosis not present

## 2017-08-08 DIAGNOSIS — F1410 Cocaine abuse, uncomplicated: Secondary | ICD-10-CM | POA: Diagnosis present

## 2017-08-08 DIAGNOSIS — F4310 Post-traumatic stress disorder, unspecified: Secondary | ICD-10-CM | POA: Diagnosis present

## 2017-08-08 DIAGNOSIS — Z79899 Other long term (current) drug therapy: Secondary | ICD-10-CM

## 2017-08-08 DIAGNOSIS — Z590 Homelessness: Secondary | ICD-10-CM

## 2017-08-08 DIAGNOSIS — K0889 Other specified disorders of teeth and supporting structures: Secondary | ICD-10-CM | POA: Diagnosis present

## 2017-08-08 DIAGNOSIS — H409 Unspecified glaucoma: Secondary | ICD-10-CM | POA: Diagnosis present

## 2017-08-08 DIAGNOSIS — R450 Nervousness: Secondary | ICD-10-CM | POA: Diagnosis not present

## 2017-08-08 DIAGNOSIS — R45851 Suicidal ideations: Secondary | ICD-10-CM | POA: Diagnosis present

## 2017-08-08 DIAGNOSIS — D573 Sickle-cell trait: Secondary | ICD-10-CM | POA: Diagnosis present

## 2017-08-08 DIAGNOSIS — F332 Major depressive disorder, recurrent severe without psychotic features: Principal | ICD-10-CM | POA: Diagnosis present

## 2017-08-08 DIAGNOSIS — Z8742 Personal history of other diseases of the female genital tract: Secondary | ICD-10-CM

## 2017-08-08 DIAGNOSIS — Z62810 Personal history of physical and sexual abuse in childhood: Secondary | ICD-10-CM | POA: Diagnosis not present

## 2017-08-08 DIAGNOSIS — F1124 Opioid dependence with opioid-induced mood disorder: Secondary | ICD-10-CM | POA: Diagnosis not present

## 2017-08-08 DIAGNOSIS — Z885 Allergy status to narcotic agent status: Secondary | ICD-10-CM | POA: Diagnosis not present

## 2017-08-08 DIAGNOSIS — G479 Sleep disorder, unspecified: Secondary | ICD-10-CM | POA: Diagnosis present

## 2017-08-08 DIAGNOSIS — F1210 Cannabis abuse, uncomplicated: Secondary | ICD-10-CM | POA: Diagnosis not present

## 2017-08-08 DIAGNOSIS — Z62811 Personal history of psychological abuse in childhood: Secondary | ICD-10-CM | POA: Diagnosis not present

## 2017-08-08 DIAGNOSIS — Z813 Family history of other psychoactive substance abuse and dependence: Secondary | ICD-10-CM | POA: Diagnosis not present

## 2017-08-08 MED ORDER — ALUM & MAG HYDROXIDE-SIMETH 200-200-20 MG/5ML PO SUSP: 30.0000 mL | ORAL | Status: DC | PRN

## 2017-08-08 MED ORDER — ACETAMINOPHEN 325 MG PO TABS
650.0000 mg | ORAL_TABLET | Freq: Four times a day (QID) | ORAL | Status: DC | PRN
  Administered 2017-08-08 – 2017-08-12 (×11): 650 mg via ORAL
  Filled 2017-08-08 (×12): qty 2

## 2017-08-08 MED ORDER — ENSURE ENLIVE PO LIQD
237.0000 mL | Freq: Two times a day (BID) | ORAL | Status: DC
  Administered 2017-08-09 – 2017-08-11 (×3): 237 mL via ORAL

## 2017-08-08 MED ORDER — CLONIDINE HCL 0.1 MG PO TABS
0.1000 mg | ORAL_TABLET | Freq: Every day | ORAL | Status: DC
  Filled 2017-08-08 (×2): qty 1

## 2017-08-08 MED ORDER — CLONIDINE HCL 0.1 MG PO TABS
0.1000 mg | ORAL_TABLET | ORAL | Status: AC
  Administered 2017-08-11 – 2017-08-13 (×4): 0.1 mg via ORAL
  Filled 2017-08-08 (×5): qty 1

## 2017-08-08 MED ORDER — CLONIDINE HCL 0.1 MG PO TABS
0.1000 mg | ORAL_TABLET | Freq: Four times a day (QID) | ORAL | Status: AC
  Administered 2017-08-08 – 2017-08-11 (×10): 0.1 mg via ORAL
  Filled 2017-08-08 (×13): qty 1

## 2017-08-08 MED ORDER — TRAZODONE HCL 50 MG PO TABS
50.0000 mg | ORAL_TABLET | Freq: Every evening | ORAL | Status: DC | PRN
  Administered 2017-08-08 – 2017-08-09 (×2): 50 mg via ORAL
  Filled 2017-08-08 (×9): qty 1

## 2017-08-08 MED ORDER — IBUPROFEN 400 MG PO TABS
400.0000 mg | ORAL_TABLET | Freq: Once | ORAL | Status: AC
  Administered 2017-08-08: 400 mg via ORAL
  Filled 2017-08-08: qty 1

## 2017-08-08 MED ORDER — LORAZEPAM 1 MG PO TABS
1.0000 mg | ORAL_TABLET | Freq: Once | ORAL | Status: AC
  Administered 2017-08-08: 1 mg via ORAL
  Filled 2017-08-08: qty 1

## 2017-08-08 MED ORDER — NICOTINE 21 MG/24HR TD PT24
21.0000 mg | MEDICATED_PATCH | Freq: Every day | TRANSDERMAL | Status: DC
  Administered 2017-08-09 – 2017-08-11 (×3): 21 mg via TRANSDERMAL
  Filled 2017-08-08 (×8): qty 1

## 2017-08-08 MED ORDER — MAGNESIUM HYDROXIDE 400 MG/5ML PO SUSP: 30.0000 mL | Freq: Every day | ORAL | Status: DC | PRN

## 2017-08-08 NOTE — Progress Notes (Addendum)
[facility]  Dr. Greer EeArchna Kumar, MD is the accepting provider.  Dr. Jola Babinskilary, MD is the attending provider.  Call report to [phone]  [facility]   Pt may be transported by Law Enforcement Pt scheduled  [facility] 5:30Pm  Carney BernJean T. Kaylyn LimSutter, MSW, LCSWA Disposition Clinical Social Work [phone] (cell) [phone] (office)
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
## 2017-08-08 NOTE — ED Notes (Signed)
Regular Diet has been ordered for Dinner. 

## 2017-08-08 NOTE — ED Notes (Signed)
Snack and drink given to patient. 
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
## 2017-08-08 NOTE — ED Notes (Signed)
Pt care assumed, obtained verbal report.  Pt is resting and appears calm.  Sitter at bedside.

## 2017-08-08 NOTE — ED Notes (Signed)
Regular breakfast tray ordered.  

## 2017-08-08 NOTE — Tx Team (Signed)
Initial Treatment Plan 08/08/2017 9:39 PM [name] ZOX:[id number]    PATIENT STRESSORS: Legal issue Substance abuse Other: pt reports discord with boyfriend   PATIENT STRENGTHS: Communication skills Motivation for treatment/growth   PATIENT IDENTIFIED PROBLEMS: SI  Substance abuse    "housing"  "drug treatment"             DISCHARGE CRITERIA:  Improved stabilization in mood, thinking, and/or behavior Need for constant or close observation no longer present  PRELIMINARY DISCHARGE PLAN: Attend 12-step recovery group Placement in alternative living arrangements  PATIENT/FAMILY INVOLVEMENT: This treatment plan has been presented to and reviewed with the patient, [name].  The patient and family have been given the opportunity to ask questions and make suggestions.  Arrie AranChurch, Clista Rainford J, [place]RN 08/08/2017, 9:39 PM

## 2017-08-08 NOTE — ED Notes (Signed)
Pt is asking for something for her anxiety, pt have been resting asleep since this nurse came on at 1500.  Made pt aware that she cannot have anymore anxiety medicine at this time, no orders placed.  She also made aware that she is waiting for [employer] to [facility].  Pt verbalizes understanding.

## 2017-08-08 NOTE — ED Notes (Signed)
Regular Diet was ordered for Lunch. 

## 2017-08-09 ENCOUNTER — Encounter (HOSPITAL_COMMUNITY): Payer: Self-pay | Admitting: *Deleted

## 2017-08-09 DIAGNOSIS — Z62811 Personal history of psychological abuse in childhood: Secondary | ICD-10-CM

## 2017-08-09 DIAGNOSIS — F1120 Opioid dependence, uncomplicated: Secondary | ICD-10-CM

## 2017-08-09 DIAGNOSIS — F17210 Nicotine dependence, cigarettes, uncomplicated: Secondary | ICD-10-CM

## 2017-08-09 DIAGNOSIS — Z813 Family history of other psychoactive substance abuse and dependence: Secondary | ICD-10-CM

## 2017-08-09 DIAGNOSIS — F1410 Cocaine abuse, uncomplicated: Secondary | ICD-10-CM

## 2017-08-09 DIAGNOSIS — F419 Anxiety disorder, unspecified: Secondary | ICD-10-CM

## 2017-08-09 DIAGNOSIS — R450 Nervousness: Secondary | ICD-10-CM

## 2017-08-09 DIAGNOSIS — F332 Major depressive disorder, recurrent severe without psychotic features: Principal | ICD-10-CM

## 2017-08-09 DIAGNOSIS — Z62810 Personal history of physical and sexual abuse in childhood: Secondary | ICD-10-CM

## 2017-08-09 DIAGNOSIS — Z590 Homelessness: Secondary | ICD-10-CM

## 2017-08-09 DIAGNOSIS — G4700 Insomnia, unspecified: Secondary | ICD-10-CM

## 2017-08-09 LAB — LIPID PANEL
Cholesterol: 165 mg/dL (ref 0–200)
HDL: 72 mg/dL (ref >40)
LDL Cholesterol: 80 mg/dL (ref 0–99)
Total CHOL/HDL Ratio: 2.3 RATIO
Triglycerides: 67 mg/dL (ref <150)
VLDL: 13 mg/dL (ref 0–40)

## 2017-08-09 LAB — TSH: TSH: 0.105 u[IU]/mL — ABNORMAL LOW (ref 0.350–4.500)

## 2017-08-09 LAB — HEMOGLOBIN A1C
HEMOGLOBIN A1C: 5.6 % (ref 4.8–5.6)
MEAN PLASMA GLUCOSE: 114.02 mg/dL

## 2017-08-09 MED ORDER — CITALOPRAM HYDROBROMIDE 10 MG PO TABS
10.0000 mg | ORAL_TABLET | Freq: Every day | ORAL | Status: DC
  Administered 2017-08-09 – 2017-08-10 (×2): 10 mg via ORAL
  Filled 2017-08-09 (×5): qty 1

## 2017-08-09 MED ORDER — METHOCARBAMOL 500 MG PO TABS
500.0000 mg | ORAL_TABLET | Freq: Three times a day (TID) | ORAL | Status: DC
  Administered 2017-08-09 – 2017-08-13 (×12): 500 mg via ORAL
  Filled 2017-08-09 (×19): qty 1

## 2017-08-09 MED ORDER — GABAPENTIN 300 MG PO CAPS
300.0000 mg | ORAL_CAPSULE | Freq: Three times a day (TID) | ORAL | Status: DC
  Administered 2017-08-09 – 2017-08-10 (×5): 300 mg via ORAL
  Filled 2017-08-09 (×10): qty 1

## 2017-08-09 MED ORDER — BENZOCAINE 10 % MT GEL
Freq: Four times a day (QID) | OROMUCOSAL | Status: DC | PRN
  Administered 2017-08-09 (×2): 1 via OROMUCOSAL
  Administered 2017-08-09 – 2017-08-12 (×8): via OROMUCOSAL
  Administered 2017-08-12: 1 via OROMUCOSAL
  Filled 2017-08-09 (×2): qty 9.4

## 2017-08-09 MED ORDER — HYDROXYZINE HCL 50 MG PO TABS
50.0000 mg | ORAL_TABLET | Freq: Four times a day (QID) | ORAL | Status: DC | PRN
  Administered 2017-08-09 – 2017-08-11 (×8): 50 mg via ORAL
  Filled 2017-08-09 (×8): qty 1
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
## 2017-08-09 NOTE — Tx Team (Signed)
Interdisciplinary Treatment and Diagnostic Plan Update  08/09/2017 Time of Session: 0830AM CALLE SCHADER MRN: [id number]  Principal Diagnosis: MDD, recurrent, severe  Secondary Diagnoses: Active Problems:   MDD (major depressive disorder), recurrent episode, severe (HCC)   Current Medications:  Current Facility-Administered Medications  Medication Dose Route Frequency Provider Last Rate Last Dose  . acetaminophen (TYLENOL) tablet 650 mg  650 mg Oral Q6H PRN Laverle Hobby, PA-C   650 mg at 08/09/17 0804  . alum & mag hydroxide-simeth (MAALOX/MYLANTA) 200-200-20 MG/5ML suspension 30 mL  30 mL Oral Q4H PRN Starkes, Takia S, FNP      . benzocaine (ORAJEL) 10 % mucosal gel   Mouth/Throat QID PRN Laverle Hobby, PA-C      . cloNIDine (CATAPRES) tablet 0.1 mg  0.1 mg Oral QID Patriciaann Clan E, PA-C   0.1 mg at 08/09/17 0804   Followed by  . [START ON 08/11/2017] cloNIDine (CATAPRES) tablet 0.1 mg  0.1 mg Oral BH-qamhs Simon, Spencer E, PA-C       Followed by  . [START ON 08/14/2017] cloNIDine (CATAPRES) tablet 0.1 mg  0.1 mg Oral QAC breakfast Patriciaann Clan E, PA-C      . feeding supplement (ENSURE ENLIVE) (ENSURE ENLIVE) liquid 237 mL  237 mL Oral BID BM Cobos, Fernando A, MD      . hydrOXYzine (ATARAX/VISTARIL) tablet 50 mg  50 mg Oral Q6H PRN Patriciaann Clan E, PA-C   50 mg at 08/09/17 0805  . magnesium hydroxide (MILK OF MAGNESIA) suspension 30 mL  30 mL Oral Daily PRN Starkes, Takia S, FNP      . nicotine (NICODERM CQ - dosed in mg/24 hours) patch 21 mg  21 mg Transdermal Daily Cobos, Myer Peer, MD   21 mg at 08/09/17 0805  . traZODone (DESYREL) tablet 50 mg  50 mg Oral QHS,MR X 1 Laverle Hobby, PA-C   50 mg at 08/08/17 2113   PTA Medications: Medications Prior to Admission  Medication Sig Dispense Refill Last Dose  . acetaminophen (TYLENOL) 500 MG tablet Take 1,000 mg by mouth every 6 (six) hours as needed for mild pain, moderate pain or headache.   Past Week at Unknown time   . diphenhydrAMINE (BENADRYL) 25 MG tablet 1-2 tablets every 6 hours for the 24 hours (3/8-3/9) 20 tablet 0 08/06/2017 at Unknown time  . ibuprofen (ADVIL,MOTRIN) 200 MG tablet Take 200-400 mg by mouth every 6 (six) hours as needed for headache, mild pain or moderate pain.    Past Week at Unknown time    Patient Stressors: Legal issue Substance abuse Other: pt reports discord with boyfriend  Patient Strengths: Agricultural engineer for treatment/growth  Treatment Modalities: Medication Management, Group therapy, Case management,  1 to 1 session with clinician, Psychoeducation, Recreational therapy.   Physician Treatment Plan for Primary Diagnosis: MDD, recurrent, severe  Medication Management: Evaluate patient's response, side effects, and tolerance of medication regimen.  Therapeutic Interventions: 1 to 1 sessions, Unit Group sessions and Medication administration.  Evaluation of Outcomes: Not Met  Physician Treatment Plan for Secondary Diagnosis: Active Problems:   MDD (major depressive disorder), recurrent episode, severe (HCC)  Medication Management: Evaluate patient's response, side effects, and tolerance of medication regimen.  Therapeutic Interventions: 1 to 1 sessions, Unit Group sessions and Medication administration.  Evaluation of Outcomes: Not Met   RN Treatment Plan for Primary Diagnosis: MDD, recurrent, severe Long Term Goal(s): Knowledge of disease and therapeutic regimen to maintain health will improve  Short  Term Goals: Ability to remain free from injury will improve, Ability to demonstrate self-control, Ability to participate in decision making will improve and Ability to disclose and discuss suicidal ideas  Medication Management: RN will administer medications as ordered by provider, will assess and evaluate patient's response and provide education to patient for prescribed medication. RN will report any adverse and/or side effects to prescribing  provider.  Therapeutic Interventions: 1 on 1 counseling sessions, Psychoeducation, Medication administration, Evaluate responses to treatment, Monitor vital signs and CBGs as ordered, Perform/monitor CIWA, COWS, AIMS and Fall Risk screenings as ordered, Perform wound care treatments as ordered.  Evaluation of Outcomes: Not Met   LCSW Treatment Plan for Primary Diagnosis: MDD, recurrent, severe Long Term Goal(s): Safe transition to appropriate next level of care at discharge, Engage patient in therapeutic group addressing interpersonal concerns.  Short Term Goals: Engage patient in aftercare planning with referrals and resources, Facilitate patient progression through stages of change regarding substance use diagnoses and concerns and Identify triggers associated with mental health/substance abuse issues  Therapeutic Interventions: Assess for all discharge needs, 1 to 1 time with Social worker, Explore available resources and support systems, Assess for adequacy in community support network, Educate family and significant other(s) on suicide prevention, Complete Psychosocial Assessment, Interpersonal group therapy.  Evaluation of Outcomes: Not Met   Progress in Treatment: Attending groups: No.  New to unit. Continuing to assess. Participating in groups: No. Taking medication as prescribed: Yes. Toleration medication: Yes. Family/Significant other contact made: No, will contact:  family member if pt consents to collateral contact.  Patient understands diagnosis: No. poor insight and poor historian Discussing patient identified problems/goals with staff: Yes. Medical problems stabilized or resolved: Yes. Denies suicidal/homicidal ideation: No. Able to contract for safety.  Issues/concerns per patient self-inventory: No. Other: n/a  New problem(s) identified: No, Describe:  n/a  New Short Term/Long Term Goal(s): detox, medication management for mood stabilization; elimination of SI  thoughts; development of comprehensive mental wellness/sobriety plan.   Patient Goal: "to get sober and get help."   Discharge Plan or Barriers: CSW assessing for appropriate referrals. Pt is homeless and has no current providers. Pt was referred to [facility] care during last admission.   Reason for Continuation of Hospitalization: Anxiety Depression Medication stabilization Suicidal ideation Withdrawal symptoms  Estimated Length of Stay: Monday, 08/14/17  Attendees: Patient: [name] 08/09/2017 8:40 AM  Physician: Dr. Nancy Fetter MD; Dr. Parke Poisson MD 08/09/2017 8:40 AM  Nursing: Estill Bamberg RN; Grapeland RN 08/09/2017 8:40 AM  RN Care Manager:x 08/09/2017 8:40 AM  Social Worker: Press photographer, LCSW 08/09/2017 8:40 AM  Recreational Therapist: x 08/09/2017 8:40 AM  Other: Lindell Spar NP 08/09/2017 8:40 AM  Other:  08/09/2017 8:40 AM  Other: 08/09/2017 8:40 AM    Scribe for Treatment Team: Kimber Relic Smart, LCSW 08/09/2017 8:40 AM

## 2017-08-09 NOTE — Progress Notes (Signed)
D: Pt was in bed in her room upon initial approach.  Pt presents with labile affect and mood.  Pt reports "I feel droggy" and her goal is "just to get some rest."  She reports she has been eating and drinking well today.  Pt denies SI/HI, denies hallucinations, reports mouth pain of 9/10.  Pt has been isolative to her room for the majority of the evening.  She did not attend evening group.  A: Introduced self to pt.  Actively listened to pt and offered support and encouragement. Medication administered per order.  PRN medication administered for mouth pain and anxiety.  Q15 minute safety checks maintained.  R: Pt is safe on the unit.  Pt is compliant with medications.  Pt verbally contracts for safety.  Will continue to monitor and assess.

## 2017-08-09 NOTE — [facility] Group Notes (Signed)
[facility] LCSW Group Therapy Note  Date/Time   08/09/17   Type of Therapy/Topic:  Group Therapy:  Balance in Life  Participation Level:  Did not attend.  Description of Group:    This group will address the concept of balance and how it feels and looks when one is unbalanced. Patients will be encouraged to process areas in their lives that are out of balance, and identify reasons for remaining unbalanced. Facilitators will guide patients utilizing problem- solving interventions to address and correct the stressor making their life unbalanced. Understanding and applying boundaries will be explored and addressed for obtaining  and maintaining a balanced life. Patients will be encouraged to explore ways to assertively make their unbalanced needs known to significant others in their lives, using other group members and facilitator for support and feedback.  Therapeutic Goals: 1. Patient will identify two or more emotions or situations they have that consume much of in their lives. 2. Patient will identify signs/triggers that life has become out of balance:  3. Patient will identify two ways to set boundaries in order to achieve balance in their lives:  4. Patient will demonstrate ability to communicate their needs through discussion and/or role plays  Summary of Patient Progress: Patient did not attend.   Therapeutic Modalities:   Cognitive Behavioral Therapy Solution-Focused Therapy Assertiveness Training

## 2017-08-09 NOTE — Progress Notes (Signed)
Recreation Therapy Notes  Date: 4.17.19 Time: 9:30 a.m. Location: [address] Dayroom   Group Topic: Stress Management   Goal Area(s) Addresses:  Goal 1.1: To reduce stress  -Patient will feel a reduction in stress level  -Patient will understand the importance of stress management  -Patient will participate during stress management group      Intervention: Stress Management  Activity: Guided Imagery: Patients were in a peaceful environment with soft lighting enhancing patients mood. Patients listened to a deep concentration meditation from the calm app.  Education: Stress Management, Discharge Planning.    Education Outcome: Acknowledges edcuation/In group clarification offered/Needs additional education   Clinical Observations/Feedback:: Patient did not attend     Sheryle Hailarian Shandell Jallow, Recreation Therapy Intern   Sheryle HailDarian Taziyah Iannuzzi 08/09/2017 8:30 AM

## 2017-08-09 NOTE — Progress Notes (Signed)
NUTRITION ASSESSMENT  Pt identified as at risk on the Malnutrition Screen Tool  INTERVENTION: 1. Supplements: Continue Ensure Enlive po BID, each supplement provides 350 kcal and 20 grams of protein  NUTRITION DIAGNOSIS: Unintentional weight loss related to sub-optimal intake as evidenced by pt report.   Goal: Pt to meet >/= 90% of their estimated nutrition needs.  Monitor:  PO intake  Assessment:  Pt admitted with depression and polysubstance  Abuse (heroin, cocaine, etc). Pt reports 20 lb weight loss x 2 months. Per chart review, no scaled weights in records but weight loss seems to be the trend since 2018.  Pt [facility]. Ensure supplements have been ordered.   Height: Ht Readings from Last 1 Encounters:  08/08/17 5\' 4"  (1.626 m)    Weight: Wt Readings from Last 1 Encounters:  08/08/17 128 lb (58.1 kg)    Weight Hx: Wt Readings from Last 10 Encounters:  08/08/17 128 lb (58.1 kg)  08/07/17 120 lb (54.4 kg)  08/07/17 120 lb (54.4 kg)  07/24/17 120 lb (54.4 kg)  04/25/17 128 lb (58.1 kg)  12/06/16 150 lb (68 kg)  09/27/16 153 lb (69.4 kg)  09/12/16 161 lb (73 kg)  09/09/16 151 lb (68.5 kg)  07/19/16 151 lb (68.5 kg)    BMI:  Body mass index is 21.97 kg/m. Pt meets criteria for normal based on current BMI.  Estimated Nutritional Needs: Kcal: 25-30 kcal/kg Protein: > 1 gram protein/kg Fluid: 1 ml/kcal  Diet Order: Diet regular Room service appropriate? Yes; Fluid consistency: Thin Pt is also offered choice of unit snacks mid-morning and mid-afternoon.  Pt is eating as desired.   Lab results and medications reviewed.   Tilda FrancoLindsey Simya Tercero, MS, RD, LDN [facility] Clinical Dietitian Pager: [phone] After Hours Pager: [phone]

## 2017-08-09 NOTE — Progress Notes (Signed)
Patient ID: [name], female   DOB: 04-12-1986, 32 y.o.   MRN: [id number]  Nursing Progress Note [id number]  Data: Patient presents with anxious/irritable mood. Patient is assertive with her needs and report generalized muscle pain and lower tooth pain 9/10. Patient complains of anxiety and withdrawal symptoms. Patient complaint with scheduled medications. Patient reports passive SI but denies HI/AVH. Patient contracts for safety on the unit at this time. Patient provided but declined to complete their self-inventory sheet. Patient is isolative to her room and irritable with staff/peers.  Action: Patient educated about and provided medication per provider's orders. Patient safety maintained with q15 min safety checks. High fall risk precautions in place. Emotional support given. 1:1 interaction and active listening provided. Patient encouraged to attend meals and groups. Labs, vital signs and patient behavior monitored throughout shift. Patient encouraged to work on treatment plan and goals.  Response: Patient remains safe on the unit at this time. Will continue to support and monitor.
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
## 2017-08-10 DIAGNOSIS — F1124 Opioid dependence with opioid-induced mood disorder: Secondary | ICD-10-CM

## 2017-08-10 DIAGNOSIS — F1210 Cannabis abuse, uncomplicated: Secondary | ICD-10-CM

## 2017-08-10 LAB — T4, FREE: FREE T4: 0.65 ng/dL (ref 0.61–1.12)

## 2017-08-10 MED ORDER — AMOXICILLIN-POT CLAVULANATE 875-125 MG PO TABS
1.0000 | ORAL_TABLET | Freq: Two times a day (BID) | ORAL | Status: DC
  Administered 2017-08-10 – 2017-08-13 (×6): 1 via ORAL
  Filled 2017-08-10 (×5): qty 1
  Filled 2017-08-10: qty 14
  Filled 2017-08-10 (×2): qty 1
  Filled 2017-08-10: qty 14
  Filled 2017-08-10 (×2): qty 1

## 2017-08-10 MED ORDER — ACETAMINOPHEN 500 MG PO TABS
500.0000 mg | ORAL_TABLET | Freq: Once | ORAL | Status: AC
  Administered 2017-08-10: 500 mg via ORAL
  Filled 2017-08-10 (×2): qty 1

## 2017-08-10 MED ORDER — HYDROXYZINE HCL 50 MG PO TABS
50.0000 mg | ORAL_TABLET | Freq: Once | ORAL | Status: AC
  Administered 2017-08-10: 50 mg via ORAL
  Filled 2017-08-10 (×2): qty 1

## 2017-08-10 MED ORDER — VENLAFAXINE HCL ER 37.5 MG PO CP24
37.5000 mg | ORAL_CAPSULE | Freq: Every day | ORAL | Status: DC
  Filled 2017-08-10: qty 1

## 2017-08-10 MED ORDER — HYDROCORTISONE 1 % EX CREA
TOPICAL_CREAM | Freq: Two times a day (BID) | CUTANEOUS | Status: DC
  Administered 2017-08-11 (×2): via TOPICAL
  Filled 2017-08-10: qty 28

## 2017-08-10 MED ORDER — MIRTAZAPINE 7.5 MG PO TABS
7.5000 mg | ORAL_TABLET | Freq: Every day | ORAL | Status: DC
Start: 2017-08-10 — End: 2017-08-12
  Administered 2017-08-10 – 2017-08-11 (×2): 7.5 mg via ORAL
  Filled 2017-08-10 (×4): qty 1

## 2017-08-10 NOTE — Progress Notes (Signed)
Pt did not attend this evening's AA group because she was sleeping at the time of the group. 

## 2017-08-10 NOTE — Progress Notes (Signed)
Pt came to the med window crying stating that her teeth were hurting and she felt the whole heard was thrombin. Pt offered tylenol, but pt stated she wanted something stronger like xanax, hydrocodone because tylenol does not help. Pt took tylenol and heat pack.

## 2017-08-10 NOTE — [facility] Suicide Risk Assessment (Signed)
[facility] INPATIENT:  Family/Significant Other Suicide Prevention Education  Suicide Prevention Education:  Patient Refusal for Family/Significant Other Suicide Prevention Education: The patient [name] has refused to provide written consent for family/significant other to be provided Family/Significant Other Suicide Prevention Education during admission and/or prior to discharge.  Physician notified.  SPE completed with pt, as pt refused to consent to family contact. SPI pamphlet provided to pt and pt was encouraged to share information with support network, ask questions, and talk about any concerns relating to SPE. Pt denies access to guns/firearms and verbalized understanding of information provided. Mobile Crisis information also provided to pt.   Blythe Veach N Smart LCSW 08/10/2017, 11:03 AM

## 2017-08-10 NOTE — Progress Notes (Signed)
DAR NOTE: Patient presents with anxious affect and depressed mood.Pt has been isolating her self most of the shift, stayed in bed stating having muscle aches.  Denies auditory and visual hallucinations.  Rates depression at 6, hopelessness at 6, and anxiety at 6.  Maintained on routine safety checks.  Medications given as prescribed.  Support and encouragement offered as needed.  Will continue to monitor.

## 2017-08-10 NOTE — Progress Notes (Addendum)
Patient ID: [name], female   DOB: 10/05/1985, 32 y.o.   MRN: [id number] Have met with patient and have reviewed case with NP , Priscille Loveless . Please also refer to NP Progress Note. Reports depression, anxiety, feeling agitated, and ongoing symptoms of WDL, such as aches, myalgias, rhinorrhea. No vomiting . Vitals stable. At this time denies suicidal ideations, and is future oriented expressing interest in going to a rehab at discharge. We reviewed medication issues-  she does not feel Celexa is helping and is wanting to change to another antidepressant trial. She remembers she has been on Remeron in the past , does not remember having had side effects, and thinks it may have helped more.  She also reports she does not feel Neurontin is effective.    -D/C Celexa  -Start Remeron 7.5 mgrs QHS -Discontinue Neurontin  -She is expressing interest in STD work up as well as  HIV and Viral Hepatitis testing   F Cobos MD

## 2017-08-10 NOTE — Plan of Care (Signed)
  Problem: Safety: Goal: Periods of time without injury will increase Outcome: Progressing Note:  Pt has not harmed self or others tonight.  She denies SI/HI and verbally contracts for safety.    

## 2017-08-10 NOTE — [facility] Group Notes (Signed)
LCSW Group Therapy Note   08/10/2017 1:15pm   Type of Therapy and Topic:  Group Therapy:  Overcoming Obstacles   Participation Level:  Did Not Attend-pt invited. Chose to remain in bed.    Description of Group:    In this group patients will be encouraged to explore what they see as obstacles to their own wellness and recovery. They will be guided to discuss their thoughts, feelings, and behaviors related to these obstacles. The group will process together ways to cope with barriers, with attention given to specific choices patients can make. Each patient will be challenged to identify changes they are motivated to make in order to overcome their obstacles. This group will be process-oriented, with patients participating in exploration of their own experiences as well as giving and receiving support and challenge from other group members.   Therapeutic Goals: 1. Patient will identify personal and current obstacles as they relate to admission. 2. Patient will identify barriers that currently interfere with their wellness or overcoming obstacles.  3. Patient will identify feelings, thought process and behaviors related to these barriers. 4. Patient will identify two changes they are willing to make to overcome these obstacles:      Summary of Patient Progress  x    Therapeutic Modalities:   Cognitive Behavioral Therapy Solution Focused Therapy Motivational Interviewing Relapse Prevention Therapy  Ledell PeoplesHeather N Smart, LCSW 08/10/2017 12:16 PM

## 2017-08-10 NOTE — Progress Notes (Signed)
Pt is tearful and in distress upon approach.  She continues to complain of sharp, shooting, and throbbing pain in her teeth, especially on the R side.  She reports this pain is radiating to her R ear and pt has tissue in R ear.  When asked why she has tissue in R ear, she states "to stop the wind because it makes it hurt more." Hot and cold food and fluids make pain worse. Concern of potential infection relayed to on-site provider due to poor dentition and symptoms pt has reported.  Pt has been started on Augmentin scheduled, see MAR.  Tylenol 500 mg POX1 and Vistaril 50 mg POX1 were ordered and administered.  Pain medication options are limited due to past reported allergies.  She did receive Ibuprofen 400 mg POX1 while in ED but NSAID still listed as allergy.  Pt also voices concern of itching related to eczema to bilateral arms.  She scratched R arm superficially and drew blood.  On-site provider notified and pt will start hydrocortisone cream scheduled tomorrow.  Pt informed of importance of need for urine specimen and she verbalized understanding.  Pt has specimen cup and reports she will provide specimen when able to.  Pt reports she has been eating and drinking well today.  Pt denies SI/HI and hallucinations.  Her goal tonight is to "sleep good."    Actively listened to pt and provided emotional support and reassurance.  Medication administered per order.  PRN medication administered for mouth pain.  PO fluids encouraged and pt provided with Gatorade.  Q15 minute safety checks maintained.  Lorae verbally contracts for safety and expresses gratitude for interventions performed.  Pt is safe on the unit.  Will continue to monitor and assess.
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
## 2017-08-11 LAB — CBC WITH DIFFERENTIAL/PLATELET
BASOS PCT: 0 %
Basophils Absolute: 0 10*3/uL (ref 0.0–0.1)
Eosinophils Absolute: 0.1 10*3/uL (ref 0.0–0.7)
Eosinophils Relative: 1 %
HEMATOCRIT: 36.1 % (ref 36.0–46.0)
HEMOGLOBIN: 12.1 g/dL (ref 12.0–15.0)
LYMPHS ABS: 3 10*3/uL (ref 0.7–4.0)
LYMPHS PCT: 40 %
MCH: 29.6 pg (ref 26.0–34.0)
MCHC: 33.5 g/dL (ref 30.0–36.0)
MCV: 88.3 fL (ref 78.0–100.0)
MONO ABS: 0.8 10*3/uL (ref 0.1–1.0)
Monocytes Relative: 10 %
NEUTROS PCT: 47 %
Neutro Abs: 3.5 10*3/uL (ref 1.7–7.7)
Platelets: 424 10*3/uL — ABNORMAL HIGH (ref 150–400)
RBC: 4.09 MIL/uL (ref 3.87–5.11)
RDW: 15.1 % (ref 11.5–15.5)
WBC: 7.4 10*3/uL (ref 4.0–10.5)

## 2017-08-11 LAB — T3, FREE: T3, Free: 2 pg/mL (ref 2.0–4.4)

## 2017-08-11 LAB — T4: T4, Total: 4.6 ug/dL (ref 4.5–12.0)

## 2017-08-11 MED ORDER — TRAZODONE HCL 50 MG PO TABS
50.0000 mg | ORAL_TABLET | Freq: Once | ORAL | Status: AC
  Administered 2017-08-11: 50 mg via ORAL
  Filled 2017-08-11 (×2): qty 1

## 2017-08-11 MED ORDER — LORAZEPAM 0.5 MG PO TABS
0.5000 mg | ORAL_TABLET | Freq: Four times a day (QID) | ORAL | Status: DC | PRN
  Administered 2017-08-11 – 2017-08-13 (×8): 0.5 mg via ORAL
  Filled 2017-08-11 (×8): qty 1

## 2017-08-11 MED ORDER — VENLAFAXINE HCL ER 37.5 MG PO CP24
37.5000 mg | ORAL_CAPSULE | Freq: Every day | ORAL | Status: DC
  Administered 2017-08-11 – 2017-08-12 (×2): 37.5 mg via ORAL
  Filled 2017-08-11 (×4): qty 1

## 2017-08-11 NOTE — [facility] Group Notes (Signed)
LCSW Group Therapy Note  08/11/2017 1:15pm  Type of Therapy and Topic:  Group Therapy:  Feelings around Relapse and Recovery  Participation Level:  Did Not Attend--pt invited. Chose to remain in bed.    Description of Group:    Patients in this group will discuss emotions they experience before and after a relapse. They will process how experiencing these feelings, or avoidance of experiencing them, relates to having a relapse. Facilitator will guide patients to explore emotions they have related to recovery. Patients will be encouraged to process which emotions are more powerful. They will be guided to discuss the emotional reaction significant others in their lives may have to their relapse or recovery. Patients will be assisted in exploring ways to respond to the emotions of others without this contributing to a relapse.  Therapeutic Goals: 1. Patient will identify two or more emotions that lead to a relapse for them 2. Patient will identify two emotions that result when they relapse 3. Patient will identify two emotions related to recovery 4. Patient will demonstrate ability to communicate their needs through discussion and/or role plays   Summary of Patient Progress:  x   Therapeutic Modalities:   Cognitive Behavioral Therapy Solution-Focused Therapy Assertiveness Training Relapse Prevention Therapy   Ledell PeoplesHeather N Smart, LCSW 08/11/2017 10:43 AM

## 2017-08-11 NOTE — Progress Notes (Signed)
Recreation Therapy Notes  Date: 4.19.19 Time: 9:30 a.m. Location: [address] Dayroom   Group Topic: Stress Management   Goal Area(s) Addresses:  Goal 1.1: To reduce stress  -Patient will feel a reduction in stress level  -Patient will understand the importance of stress management  -Patient will participate during stress management group      Intervention: Stress Management  Activity: Meditation: Patients were in a peaceful environment with soft lighting enhancing patients mood. Patients listened to a body scan meditation from the calm app to help release tension and stress.  Education: Stress Management, Discharge Planning.    Education Outcome: Acknowledges edcuation/In group clarification offered/Needs additional education   Clinical Observations/Feedback:: Patient did not attend     Ladell Lea, Recreation Therapy Intern   [name] 08/11/2017 9:26 AM 

## 2017-08-11 NOTE — Progress Notes (Signed)
DAR NOTE: Patient presents with anxious affect and depressed mood. Pt complained of teeth aches, pt stared on antibiotic amoxillin last night, pt also complained of itches and rush all over the body, pt stated that when she is withdrawing from drugs, normally she develops rush and itchiness. Hydrocortisone cream ordered. Pt stated she wants to be discharged because she can manage her pain well out side. Denies auditory and visual hallucinations.  Rates depression at 7, hopelessness at 6, and anxiety at 9.  Maintained on routine safety checks.  Medications given as prescribed.  Support and encouragement offered as needed. Did not attend group. States goal for today is "rest mouth."  Will continue to monitor.

## 2017-08-11 NOTE — Progress Notes (Signed)
Pt attended AA meeting.  
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
## 2017-08-11 NOTE — Progress Notes (Signed)
Patient ID: Kathy OxfordSarah D XXXBigelow, female   DOB: May 29, 1985, 32 y.o.   MRN: [id number]  Pt currently presents with a labile affect and cooperative, anxious behavior. Pt reports to writer that their goal is to "figure out how to get all of my teeth pulled." Pt reports ongoing nerve pain. Pt reports that she spoke with MD about getting "Fioricet for my headaches." Pt denies one currently. Pt remains in dayroom, interacts with peers pleasantly. Eats snack. Pt reports good sleep with current medication regimen.   Pt provided with medications per providers orders. Pt's labs and vitals were monitored throughout the night. Pt given a 1:1 about emotional and mental status. Pt supported and encouraged to express concerns and questions. Pt educated on medications and pain management.  Pt's safety ensured with 15 minute and environmental checks. Pt currently denies SI/HI and A/V hallucinations. Pt verbally agrees to seek staff if SI/HI or A/VH occurs and to consult with staff before acting on any harmful thoughts. Will continue POC.

## 2017-08-12 DIAGNOSIS — R51 Headache: Secondary | ICD-10-CM

## 2017-08-12 LAB — HEPATITIS C ANTIBODY: HCV Ab: <0.1 s/co ratio (ref 0.0–0.9)

## 2017-08-12 LAB — HEPATITIS B SURFACE ANTIGEN: Hepatitis B Surface Ag: NEGATIVE

## 2017-08-12 LAB — HIV ANTIBODY (ROUTINE TESTING W REFLEX): HIV SCREEN 4TH GENERATION: NONREACTIVE

## 2017-08-12 LAB — RPR: RPR Ser Ql: NONREACTIVE

## 2017-08-12 MED ORDER — VENLAFAXINE HCL ER 75 MG PO CP24
75.0000 mg | ORAL_CAPSULE | Freq: Every day | ORAL | Status: DC
  Administered 2017-08-13: 75 mg via ORAL
  Filled 2017-08-12: qty 7
  Filled 2017-08-12 (×2): qty 1

## 2017-08-12 MED ORDER — ASPIRIN-ACETAMINOPHEN-CAFFEINE 250-250-65 MG PO TABS
2.0000 | ORAL_TABLET | Freq: Four times a day (QID) | ORAL | Status: DC | PRN
  Administered 2017-08-12 – 2017-08-13 (×3): 2 via ORAL
  Filled 2017-08-12 (×3): qty 2

## 2017-08-12 MED ORDER — MIRTAZAPINE 15 MG PO TABS
15.0000 mg | ORAL_TABLET | Freq: Every day | ORAL | Status: DC
  Administered 2017-08-12: 15 mg via ORAL
  Filled 2017-08-12: qty 1
  Filled 2017-08-12: qty 7
  Filled 2017-08-12: qty 1

## 2017-08-12 NOTE — Progress Notes (Signed)
Adult Psychoeducational Group Note  Date:  08/12/2017 Time:  4:00 PM  Group Topic/Focus: Cognitive Distortion  Managing Feelings:   The focus of this group is to identify what feelings patients have difficulty handling and develop a plan to handle them in a healthier way upon discharge.  Participation Level:  Active  Participation Quality:  Sharing  Affect:  Appropriate  Cognitive:  Alert  Insight: Improving  Engagement in Group:  Improving  Modes of Intervention:  Discussion and Education  Additional Comments:  Pt came to group late and participated.  Mingo AmberLindsey Ashleymarie Granderson 08/12/2017, 5:10 PM

## 2017-08-12 NOTE — Progress Notes (Signed)
D. Pt pleasant but anxious on approach, complaint of anxiety.  Pt did not attend evening AA group, spent time working in her workbook.  Pt observed interacting appropriately with peers on the unit.  Pt denies SI/HI/AVH at this time.  A.  Support and encouragement offered, medication given as ordered  R.  Pt remains safe on the unit, will continue to monitor.

## 2017-08-12 NOTE — [facility] Group Notes (Signed)
Pt was invited but did not attend orientation/goals group. 

## 2017-08-12 NOTE — Progress Notes (Signed)
Patient ID: [name], female   DOB: 04/19/1986, 32 y.o.   MRN: [id number]  Nursing Progress Note [id number]  Data: Patient presents with flat/sad affect and depressed mood. Patient complaint with scheduled medications. Patient denies SI/HI/AVH. Patient contracts for safety on the unit at this time. Patient provided but declined to complete their self-inventory sheet. Patient with consistent tooth pain and withdrawal symptoms. Patient appears anxious and requests Ativan. Patient is isolative to her room for most of the morning but did get up and attend meals/RN group.  Action: Patient educated about and provided medication per provider's orders. Patient safety maintained with q15 min safety checks. High fall risk precautions in place. Emotional support given. 1:1 interaction and active listening provided. Patient encouraged to attend meals and groups. Labs, vital signs and patient behavior monitored throughout shift. Patient encouraged to work on treatment plan and goals.  Response: Patient remains safe on the unit at this time. Will continue to support and monitor.

## 2017-08-12 NOTE — Progress Notes (Signed)
Patient did not attend the evening speaker AA meeting. Pt was notified that group was beginning but returned to her room to shower.
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
## 2017-08-12 NOTE — [facility] Group Notes (Signed)
LCSW Group Therapy Note  08/12/2017    9:15-10:00AM  Type of Therapy and Topic:  Group Therapy: Anger Cues and Responses  Participation Level:  Did Not Attend   Description of Group:   In this group, patients learned how to recognize the physical, cognitive, emotional, and behavioral responses they have to anger-provoking situations.  They identified a recent time they became angry and how they reacted.  They analyzed how their reaction was possibly beneficial and how it was possibly unhelpful.  The group discussed a variety of healthier coping skills that could help with such a situation in the future.  Deep breathing was practiced briefly.  Therapeutic Goals: 1. Patients will remember their last incident of anger and how they felt emotionally and physically, what their thoughts were at the time, and how they behaved. 2. Patients will identify how their behavior at that time worked for them, as well as how it worked against them. 3. Patients will explore possible new behaviors to use in future anger situations. 4. Patients will learn that anger itself is normal and cannot be eliminated, and that healthier reactions can assist with resolving conflict rather than worsening situations.  Summary of Patient Progress:  N/A  Therapeutic Modalities:   Cognitive Behavioral Therapy  Nidya Bouyer J Grossman-Orr  08/12/2017 12:00pm  

## 2017-08-12 NOTE — Plan of Care (Signed)
Problem: Health Behavior/Discharge Planning: Goal: Compliance with treatment plan for underlying cause of condition will improve Intervention: Patient encouraged to take medications as prescribed and attend groups. Patient encouraged to be active in their recovery. Outcome: Patient is taking medications as prescribed. Patient not attending groups regularly. 08/12/2017 11:14 AM - Progressing by Ferrel Loganollazo, Dalton Mille A, RN   Problem: Safety: Goal: Periods of time without injury will increase Intervention: Patient contracts for safety on the unit. High fall risk precautions in place. Safety monitored with q15 minute checks. Outcome: Patient remains safe on the unit at this time. 08/12/2017 11:14 AM - Progressing by Ferrel Loganollazo, Kirt Chew A, RN

## 2017-08-13 MED ORDER — MIRTAZAPINE 15 MG PO TABS: 15.0000 mg | ORAL_TABLET | Freq: Every day | ORAL | 0 refills | Status: DC

## 2017-08-13 MED ORDER — VENLAFAXINE HCL ER 75 MG PO CP24: 75.0000 mg | ORAL_CAPSULE | Freq: Every day | ORAL | 0 refills | Status: DC

## 2017-08-13 MED ORDER — AMOXICILLIN-POT CLAVULANATE 875-125 MG PO TABS: 1.0000 | ORAL_TABLET | Freq: Two times a day (BID) | ORAL | 0 refills | Status: DC

## 2017-08-13 NOTE — [facility] Group Notes (Signed)
Pt was invited but did not attend orientation/goals group. 

## 2017-08-13 NOTE — Progress Notes (Signed)
  [facility] :  Will you be returning to the same living situation after discharge:  Yes,  per patient wants to return to same situation, will stay with sister tonight and does not know where she will go afterward At discharge, do you have transportation home?: Yes,  sister Do you have the ability to pay for your medications: No.  States there are no issues, but actually has no income, no insurance  Release of information consent forms completed and turned in to Medical Records by CSW.   Patient to Follow up at: Follow-up Information    [facility] Follow up on 08/18/2017.   Specialty:  Behavioral Health Why:  Hospital follow-up on Friday, 4/26 at 8:00AM. Please bring: photo ID, social security card, and any proof of income if you have it. Thank you.  Contact information: [address] [place] [id number] [phone]           Next level of care provider has [facility] to [facility] Link:no  Safety Planning and Suicide Prevention discussed: No.  Declined, provided to patient only  Have you used any form of tobacco in the last 30 days? (Cigarettes, Smokeless Tobacco, Cigars, and/or Pipes): Yes  Has patient been referred to the Quitline?: Patient refused referral  Patient has been referred for addiction treatment: Yes  Lynnell ChadMareida J Grossman-Orr, LCSW 08/13/2017, 12:23 PM

## 2017-08-13 NOTE — Progress Notes (Signed)
Patient ID: Cloyde ReamsSarah Denise Boen, female   DOB: 03/05/1986, 32 y.o.   MRN: [id number]  Discharge Note  D) Patient discharged to lobby on 08/13/2017 2:45 PM. Patient states readiness for discharge. Patient denies SI/HI, AVH and is not delusional or psychotic. Patient in no acute distress. Patient has completed their Suicide Safety Plan. Patient provided an opportunity to complete and return Patient Satisfaction Survey.  A) Written and verbal discharge instructions given to the patient. Patient accepting to information and verbalized understanding. Patient agrees to the discharge plan. Opportunity for questions and concerns presented to patient. Patient denied any further questions or concerns. All belongings returned to patient. Patient signed for return of belongings and discharge paperwork. Patient provided a copy of their Suicide Safety Plan and has completed a Patient Satisfaction Survey.  R) Patient safely escorted to the lobby. [facility] samples, prescriptions, personal belongings, follow-up appointment in place and discharge paperwork.
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
## 2017-08-13 NOTE — [facility] Group Notes (Signed)
[facility] Group Notes: (Clinical Social Work)   08/13/2017      Type of Therapy:  Group Therapy   Participation Level:  Did Not Attend despite MHT prompting   Ambrose MantleMareida Grossman-Orr, LCSW 08/13/2017, 12:22 PM

## 2017-08-13 NOTE — Progress Notes (Signed)
Patient ID: Kathy OxfordSarah D XXXBigelow, female   DOB: [birth year], 32 y.o.   MRN: [id number]  Nursing Progress Note [id number]  Data: Patient presents with anxious mood and slept late this morning. Patient complaint with scheduled medications and requests PRN medications as frequently as ordered. Patient denies SI/HI/AVH. Patient contracts for safety on the unit at this time. Patient complains of tooth pain 8/10. PRN medications provided. Patient completed self-inventory sheet and rates depression, hopelessness, and anxiety 3,3,7 respectively. Patient rates their sleep and appetite as fair/good respectively. Patient states goal for today is to "leave with a good attitude".   Action: Patient educated about and provided medication per provider's orders. Patient safety maintained with q15 min safety checks. High fall risk precautions in place. Emotional support given. 1:1 interaction and active listening provided. Patient encouraged to attend meals and groups. Labs, vital signs and patient behavior monitored throughout shift. Patient encouraged to work on treatment plan and goals.  Response: Patient remains safe on the unit at this time. Will continue to support and monitor.
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
## 2017-08-13 NOTE — Plan of Care (Signed)
Problem: Safety: Goal: Periods of time without injury will increase Intervention: Patient contracts for safety on the unit. High fall risk precautions in place. Safety monitored with q15 minute checks. Outcome: Patient remains safe on the unit at this time. 08/13/2017 10:42 AM - Progressing by Ferrel Loganollazo, Ezra Denne A, RN

## 2017-08-14 LAB — GC/CHLAMYDIA PROBE AMP (~~LOC~~) NOT AT ARMC
Chlamydia: NEGATIVE
Neisseria Gonorrhea: NEGATIVE
TRICH (WINDOWPATH): POSITIVE — AB

## 2017-09-01 ENCOUNTER — Encounter (HOSPITAL_COMMUNITY): Payer: Self-pay

## 2017-09-01 ENCOUNTER — Emergency Department (HOSPITAL_COMMUNITY)
Admission: EM | Admit: 2017-09-01 | Discharge: 2017-09-01 | Disposition: A | Discharge status: Home / Self Care | Payer: Self-pay | Attending: Emergency Medicine | Admitting: Emergency Medicine

## 2017-09-01 ENCOUNTER — Emergency Department (HOSPITAL_COMMUNITY): Payer: Self-pay

## 2017-09-01 DIAGNOSIS — K08419 Partial loss of teeth due to trauma, unspecified class: Secondary | ICD-10-CM | POA: Insufficient documentation

## 2017-09-01 DIAGNOSIS — Y939 Activity, unspecified: Secondary | ICD-10-CM | POA: Insufficient documentation

## 2017-09-01 DIAGNOSIS — Z91040 Latex allergy status: Secondary | ICD-10-CM | POA: Insufficient documentation

## 2017-09-01 DIAGNOSIS — Y929 Unspecified place or not applicable: Secondary | ICD-10-CM | POA: Insufficient documentation

## 2017-09-01 DIAGNOSIS — F17210 Nicotine dependence, cigarettes, uncomplicated: Secondary | ICD-10-CM | POA: Insufficient documentation

## 2017-09-01 DIAGNOSIS — Z79899 Other long term (current) drug therapy: Secondary | ICD-10-CM | POA: Insufficient documentation

## 2017-09-01 DIAGNOSIS — Y999 Unspecified external cause status: Secondary | ICD-10-CM | POA: Insufficient documentation

## 2017-09-01 MED ORDER — ACETAMINOPHEN 325 MG PO TABS
650.0000 mg | ORAL_TABLET | Freq: Once | ORAL | Status: AC
  Administered 2017-09-01: 650 mg via ORAL
  Filled 2017-09-01: qty 2

## 2017-09-01 MED ORDER — ACETAMINOPHEN ER 650 MG PO TBCR: 650.0000 mg | EXTENDED_RELEASE_TABLET | Freq: Three times a day (TID) | ORAL | 0 refills | Status: DC | PRN

## 2017-09-01 MED ORDER — AMOXICILLIN 500 MG PO CAPS: 500.0000 mg | ORAL_CAPSULE | Freq: Three times a day (TID) | ORAL | 0 refills | Status: DC

## 2017-09-01 NOTE — ED Notes (Addendum)
Pt asking for graham crackers. Pt reminded that she may have oral trauma so need to wait.
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
## 2017-09-14 ENCOUNTER — Other Ambulatory Visit: Payer: Self-pay

## 2017-09-14 ENCOUNTER — Encounter (HOSPITAL_COMMUNITY): Payer: Self-pay | Admitting: *Deleted

## 2017-09-14 ENCOUNTER — Emergency Department (HOSPITAL_COMMUNITY)
Admission: EM | Admit: 2017-09-14 | Discharge: 2017-09-14 | Disposition: A | Discharge status: Home / Self Care | Payer: Medicaid Other | Attending: Emergency Medicine | Admitting: Emergency Medicine

## 2017-09-14 DIAGNOSIS — F1110 Opioid abuse, uncomplicated: Secondary | ICD-10-CM | POA: Insufficient documentation

## 2017-09-14 DIAGNOSIS — F1210 Cannabis abuse, uncomplicated: Secondary | ICD-10-CM | POA: Insufficient documentation

## 2017-09-14 DIAGNOSIS — M545 Low back pain, unspecified: Secondary | ICD-10-CM

## 2017-09-14 DIAGNOSIS — F1410 Cocaine abuse, uncomplicated: Secondary | ICD-10-CM | POA: Insufficient documentation

## 2017-09-14 DIAGNOSIS — F17210 Nicotine dependence, cigarettes, uncomplicated: Secondary | ICD-10-CM | POA: Insufficient documentation

## 2017-09-14 MED ORDER — ACETAMINOPHEN 500 MG PO TABS
1000.0000 mg | ORAL_TABLET | Freq: Once | ORAL | Status: AC
  Administered 2017-09-14: 1000 mg via ORAL
  Filled 2017-09-14: qty 2
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
## 2017-09-14 NOTE — ED Notes (Signed)
ED Provider at bedside. 

## 2017-09-14 NOTE — Discharge Instructions (Signed)
You have been seen in the Emergency Department (ED)  today for back pain.  Your workup and exam have not shown any acute abnormalities and you are likely suffering from muscle strain or possible problems with your discs, but there is no treatment that will fix your symptoms at this time.  Please take Motrin (ibuprofen) as needed for your pain according to the instructions written on the box.  Alternatively, for the next five days you can take  three times daily with meals (it may upset your stomach).  Please follow up with your doctor as soon as possible regarding today's ED visit and your back pain.  Return to the ED for worsening back pain, fever, weakness or numbness of either leg, or if you develop either (1) an inability to urinate or have bowel movements, or (2) loss of your ability to control your bathroom functions (if you start having "accidents"), or if you develop other new symptoms that concern you.  Substance Abuse Treatment Programs  Intensive Outpatient Programs [facility]     [address]      [place], [place]                   [phone]       [facility] [address] [place], [place] [phone]  [facility]     (Inpatient and outpatient)     [address]           [phone]    [facility] [phone] (Suboxone and Methadone)  [address]      [place], [place] [address]      [phone]       [address] [place], [place] [phone]  [facility] (Outpatient/Inpatient, [facility])    (insurance only) [phone]             [facility] (Groups & Residential) [place], [place] [phone]     [facility]     [address]     [place], [place]      [phone]       [facility] (for caregivers and family) [phone] Laurell Joseph[address] [place], [place] [phone]      Residential Treatment  Programs [facility]      [address] [place], [place] [address]  [phone]       [facility] [address] [place], [place] [address] [phone]  [facility] [place]        [phone]       [facility] [phone]  [facility] ([facility]             [address]                                         [place], [place]                                                [phone] or [phone]                               [facility] [place] [address] [place], [address] [phone]  [facility]    [address]      [place], [place]     [phone]       [facility] [address] [place], [place] [phone]  [facility]   [address] [place], [place] [address]     [phone]      Admissions: 8am-3pm M-F  [facility] ([facility]) [address] [place], [place] [phone]  [facility]: Residential Program [phone] Days)   [place], [place]      [phone] or [phone]     [facility]: [facility] [place], [place] (Walk in Hours over the weekend or by referral)  [facility] [address] [place], [place], [place] [address] [phone]  Crisis Mobile: [facility]:  [phone] (for crisis response 24 hours a day) [facility] Hotline:      [phone] Outpatient Psychiatry and Counseling  [facility]: Mobile Crisis Management 24 hours:  [phone]  [facility] of the [employer] sliding scale fee and walk in schedule: M-F 8am-12pm/1pm-3pm [address]  [place], [place] [address] [phone]  [facility] [address] [place], [place] [address] [phone]  [facility] ([facility] [employer])- new patient walk-in appointments available Monday - Friday 8am -3pm.          [address] [place], [place]  [address] [phone] or crisis line- [phone]  [facility] Services/ Intensive Outpatient Therapy Program [address] [place], [place] [address] [phone]  [facility]                  Crisis Services      [phone] [address]     [place], [place] [address]                 [facility]   [facility] [phone]. [address] [place], [place] [address]   [employer] of Care          [address] Laura  [place], [place] [address]       [phone]  [facility] [address] [place], [place] [address] [phone]  [facility]    [address]    [place], [place] [address]     [phone]       Andee Poles, MD     [address]     [place] [address]     [phone]       [facility] [address] [place] [address]  [facility]     [address] [place], [place]      [phone]       [facility] Eulogio Ditch, MD [address] [place], [place] [address] [phone]  [facility]     [address]     [place], [place] [address]     [phone]       [facility] [address] [place], [place] [address] [phone] Resources for Temporary Residential Assistance/Crisis Centers  DAY CENTERS [facility]) M-F 8am-3pm   [address] [place], [place] [address]   [phone] Services include: laundry, barbering, support groups, case management, phone  & computer access, showers, AA/NA mtgs, mental health/substance abuse nurse, job  skills class, disability information, VA assistance, spiritual classes, etc.   HOMELESS SHELTERS  [facility]     [facility]   [address] [place] [address] [place]      [phone]              [employer] (women and children)       [address] [place], [place] [address] [phone] [email or web address] for application and process Application Required  [facility]   [address]    [place] [address]     [phone]                    [facility] of [place] [address] [place], [place] [address] [phone] application appt.) Application Required  [facility] (women only)    [address]     [place], [place] [address]     [phone]      Intake starts 6pm daily Need valid ID, SSC, & Police report [employer] [address] [place], [place] [phone] Application Required  [employer] (men only)     [address]      [place], [place]     [phone]       [facility] [place] (Pregnant women only) [address] [place], [place] [phone]  [facility]      [address]      [place], [place] [address]     [phone]             [facility] [address] [place], [place] [phone] 90 day commitment/SA/Application process  [facility](men only)     [address]     [place], [place]     [phone]       Check-in at [facility] [address] [place], [place] [address] [phone] Men/Women/Women and Children must be there by 7 pm  [facility] [place], [place] [phone]

## 2017-10-01 ENCOUNTER — Emergency Department (HOSPITAL_COMMUNITY): Payer: Self-pay

## 2017-10-01 ENCOUNTER — Emergency Department (HOSPITAL_COMMUNITY)
Admission: EM | Admit: 2017-10-01 | Discharge: 2017-10-01 | Disposition: A | Discharge status: Home / Self Care | Payer: Self-pay | Attending: Emergency Medicine | Admitting: Emergency Medicine

## 2017-10-01 ENCOUNTER — Other Ambulatory Visit: Payer: Self-pay

## 2017-10-01 DIAGNOSIS — D573 Sickle-cell trait: Secondary | ICD-10-CM | POA: Insufficient documentation

## 2017-10-01 DIAGNOSIS — Z79899 Other long term (current) drug therapy: Secondary | ICD-10-CM | POA: Insufficient documentation

## 2017-10-01 DIAGNOSIS — F17210 Nicotine dependence, cigarettes, uncomplicated: Secondary | ICD-10-CM | POA: Insufficient documentation

## 2017-10-01 DIAGNOSIS — F419 Anxiety disorder, unspecified: Secondary | ICD-10-CM | POA: Insufficient documentation

## 2017-10-01 DIAGNOSIS — R0602 Shortness of breath: Secondary | ICD-10-CM | POA: Insufficient documentation

## 2017-10-01 DIAGNOSIS — F410 Panic disorder [episodic paroxysmal anxiety] without agoraphobia: Secondary | ICD-10-CM

## 2017-10-01 LAB — URINALYSIS, ROUTINE W REFLEX MICROSCOPIC
BACTERIA UA: NONE SEEN
Bilirubin Urine: NEGATIVE
Glucose, UA: NEGATIVE mg/dL
Ketones, ur: NEGATIVE mg/dL
Leukocytes, UA: NEGATIVE
NITRITE: NEGATIVE
PROTEIN: NEGATIVE mg/dL
Specific Gravity, Urine: 1.012 (ref 1.005–1.030)
pH: 6 (ref 5.0–8.0)

## 2017-10-01 LAB — RAPID URINE DRUG SCREEN, HOSP PERFORMED
AMPHETAMINES: NOT DETECTED
Barbiturates: NOT DETECTED
Benzodiazepines: NOT DETECTED
Cocaine: POSITIVE — AB
OPIATES: NOT DETECTED
Tetrahydrocannabinol: POSITIVE — AB

## 2017-10-01 LAB — CBC WITH DIFFERENTIAL/PLATELET
Basophils Absolute: 0 10*3/uL (ref 0.0–0.1)
Basophils Relative: 0 %
Eosinophils Absolute: 0.1 10*3/uL (ref 0.0–0.7)
Eosinophils Relative: 1 %
HEMATOCRIT: 37.9 % (ref 36.0–46.0)
HEMOGLOBIN: 12.6 g/dL (ref 12.0–15.0)
LYMPHS ABS: 3 10*3/uL (ref 0.7–4.0)
Lymphocytes Relative: 33 %
MCH: 29.4 pg (ref 26.0–34.0)
MCHC: 33.2 g/dL (ref 30.0–36.0)
MCV: 88.3 fL (ref 78.0–100.0)
MONOS PCT: 9 %
Monocytes Absolute: 0.8 10*3/uL (ref 0.1–1.0)
NEUTROS ABS: 5.1 10*3/uL (ref 1.7–7.7)
NEUTROS PCT: 57 %
Platelets: 658 10*3/uL — ABNORMAL HIGH (ref 150–400)
RBC: 4.29 MIL/uL (ref 3.87–5.11)
RDW: 14.9 % (ref 11.5–15.5)
WBC: 9 10*3/uL (ref 4.0–10.5)

## 2017-10-01 LAB — BASIC METABOLIC PANEL
ANION GAP: 8 (ref 5–15)
BUN: 16 mg/dL (ref 6–20)
CHLORIDE: 109 mmol/L (ref 101–111)
CO2: 26 mmol/L (ref 22–32)
Calcium: 8.8 mg/dL — ABNORMAL LOW (ref 8.9–10.3)
Creatinine, Ser: 0.89 mg/dL (ref 0.44–1.00)
GFR calc non Af Amer: >60 mL/min (ref >60)
Glucose, Bld: 126 mg/dL — ABNORMAL HIGH (ref 65–99)
Potassium: 4.2 mmol/L (ref 3.5–5.1)
Sodium: 143 mmol/L (ref 135–145)

## 2017-10-01 LAB — PREGNANCY, URINE: Preg Test, Ur: NEGATIVE

## 2017-10-01 MED ORDER — LORAZEPAM 1 MG PO TABS
1.0000 mg | ORAL_TABLET | Freq: Once | ORAL | Status: AC
  Administered 2017-10-01: 1 mg via ORAL
  Filled 2017-10-01: qty 1

## 2017-10-01 MED ORDER — HYDROXYZINE HCL 25 MG PO TABS: 25.0000 mg | ORAL_TABLET | Freq: Four times a day (QID) | ORAL | 0 refills | Status: DC | PRN

## 2017-10-01 MED ORDER — HYDROXYZINE HCL 25 MG PO TABS
50.0000 mg | ORAL_TABLET | Freq: Once | ORAL | Status: AC
  Administered 2017-10-01: 50 mg via ORAL
  Filled 2017-10-01: qty 2
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
## 2017-10-01 NOTE — ED Notes (Signed)
Bed: WA10 Expected date: 10/01/17 Expected time: 7:38 PM Means of arrival:  Comments: EMS 73F anxiety

## 2017-10-01 NOTE — ED Triage Notes (Signed)
Pt to ED via [facility] with c/o of severe anxiety attack. Pt is yelling out "please help me, please help me I am having a severe anxiety attack please please help me" Pt called [facility] with complaints of not being able to breath. Pt admits to using crack 3 days ago, but denies any use today.  Pt was coughing, spitting, and not wanting anyone to leave her. Pt stated to [facility] that she wanted to stop using crack, calling out to GOD that she needs help doesn't want to keep using but has no help.  Pt is homeless and wants help, a place to sleep, and wants something to calm her nerves. Pt A&O x4

## 2017-10-01 NOTE — Discharge Instructions (Signed)
Substance Abuse Treatment Programs ° °Intensive Outpatient Programs °[facility]     °[address]      °[place], [place]                   °[phone]      ° °[facility] °[address] °[place], [place] °336-379-7146 ° °[facility]     °(Inpatient and outpatient)     °[address]           °[phone]   ° °[facility] °336-288-1484 (Suboxone and Methadone) ° °[address]      °[place], [place] [address]      °336-882-2125      ° °[address] °[place], [place] °852-3033 ° °[facility] (Outpatient/Inpatient, Chemical)    °(insurance only) [phone]      °       °[facility] (Groups & Residential) °[place], [place] °336-389-1413 ° °   °[facility]     °[address]     °[place], [place]      °[phone]      ° °[facility] (for caregivers and family) °[address] °[place], [place] °336-299-4655 ° ° ° ° ° °Residential Treatment Programs °[facility]      °[address], [place], [place] [address]  °([phone]      ° °[facility] °[address] [place], [place] [address] °[phone] ° °[facility]        °336-248-8914      ° °[facility] °1-800-659-3381 ° °[facility]             °[address]                                         °[place], [place]                                                °877-615-2722 or 336-784-9470                              ° °[facility] [place] °[address] °[place], [address] °1.877.941.8954 ° °[facility]    °[address]      °[place], [place]     °336-273-5306      ° °[facility] °[address] °[place] °336-285-9073 ° °[facility]   °[address]     °[place], [place] [address]     °336-899-1550      °Admissions: 8am-3pm M-F ° °[facility] °[address] °[place],  [place] °336-227-7417 ° °[facility]: Residential Program (90 Days)   °[place], [place]      °336-725-8389 or 800-758-6077    ° °[facility] °[place], [place] °(Walk in Hours over the weekend or by referral) ° °[facility] °[address], [place], [place] [address] °(336) [phone] ° °Crisis Mobile: [facility]:  1-877-626-1772 (for crisis response 24 hours a day) °[facility] Hotline:      1-800-256-2452 °Outpatient Psychiatry and Counseling ° °[facility]: Mobile Crisis   Management 24 hours:  1-877-626-1772 ° °[facility] sliding scale fee and walk in schedule: M-F 8am-12pm/1pm-3pm °[address]  °[place], [place] [address] °336-387-6161 ° °[facility] °[address] °[place], [place] [address] °336-703-9650 ° °[facility] ([facility] new patient walk-in appointments available Monday - Friday 8am -3pm.          °[address] °[place], [place] [address] °336-676-6840 or crisis line- 336-676-6905 ° °[facility]/ Intensive Outpatient Therapy Program °[address] °[place], [place] [address] °336-832-9804 ° °[facility]                  °Crisis Services      °336.641.4993      °[address]     °[place], [place] [address]                ° °[facility]   °[facility] °[phone] °[address] °[place], [place] [address] ° ° °[facility]          °[address],  °[place], [place] [address]       °[phone] ° °[facility] °[address] °[place], [place] [address] °336-292-1510 ° °[facility]    °[address]    °[place], [place] [address]     °336-632-3505      ° °Parish McKinney, MD     °[address]     °[place] [address]     °336-282-1251     °  °[facility] °[address] °[place] [address] ° °[facility]     °[address]     °[place], [place]      °336-542-2076      ° °[facility] °Shamsher Ahluwalia, MD °[address] °[place], [place] [address] °336-420-9558 ° °[facility]     °[address]     °[place], [place] [address]     °336-274-1237      ° °[facility] °[address] °[place], [place] [address] °336-854-4450 °Resources for Temporary Residential Assistance/Crisis Centers ° °DAY CENTERS °[facility] °M-F 8am-3pm   °[address] [place], [place] [address]   336-332-0824 °Services include: laundry, barbering, support groups, case management, phone  & computer access, showers, AA/NA mtgs, mental health/substance abuse nurse, job skills class, disability information, VA assistance, spiritual classes, etc.  ° °HOMELESS SHELTERS ° °[facility]     °[facility]   °[address], [place]     °336.271.5959       °       °[facility] (women and children)       °[address] °[place], [place] [address] °336-275-0820 °[email or web address] for application and process °Application Required ° °[facility]   °[address]    °[place] [address]     °336.886.4922       °             °[facility] °[address] °, [place] [address] °336.273.5572 °336-235-0363(schedule application appt.) °Application Required ° °[facility] (women only)    °[address]     °[place], [place] [address]     °336-884-1039      °  Intake starts 6pm daily °Need valid ID, SSC, & Police report °[facility] [place] °[address] °[place], [place] °336-881-5420 °Application Required ° °[facility] (men only)     °[address]      °[place], [place]     °336.748.1962      ° °[facility] °(Pregnant women only) °[address] °[place], [place] °336-275-0206 ° °[facility]      °[address]      °[place], [place] [address]     °336-722-9951      °       °[facility] °[address] °[place], [place] °336-723-1848 °90 day commitment/SA/Application process ° °[facility](men only)     °[address]     °[place], [place]     °336-748-1962       °Check-in at 7pm     °       °[facility] °[address] °[place], [place] [address] °336-248-6684 °Men/Women/Women and Children must be there by 7 pm ° °[facility] °[place],  °336-722-8721                ° °

## 2017-10-02 ENCOUNTER — Encounter (HOSPITAL_COMMUNITY): Payer: Self-pay

## 2017-10-02 ENCOUNTER — Other Ambulatory Visit: Payer: Self-pay

## 2017-10-02 ENCOUNTER — Emergency Department (HOSPITAL_COMMUNITY)
Admission: EM | Admit: 2017-10-02 | Discharge: 2017-10-02 | Disposition: A | Discharge status: Home / Self Care | Payer: Medicaid Other | Attending: Emergency Medicine | Admitting: Emergency Medicine

## 2017-10-02 DIAGNOSIS — F419 Anxiety disorder, unspecified: Secondary | ICD-10-CM | POA: Insufficient documentation

## 2017-10-02 DIAGNOSIS — Z91040 Latex allergy status: Secondary | ICD-10-CM | POA: Insufficient documentation

## 2017-10-02 DIAGNOSIS — Z79899 Other long term (current) drug therapy: Secondary | ICD-10-CM | POA: Insufficient documentation

## 2017-10-02 DIAGNOSIS — F17210 Nicotine dependence, cigarettes, uncomplicated: Secondary | ICD-10-CM | POA: Insufficient documentation

## 2017-10-02 MED ORDER — HYDROXYZINE HCL 25 MG PO TABS
25.0000 mg | ORAL_TABLET | Freq: Once | ORAL | Status: AC
  Administered 2017-10-02: 25 mg via ORAL
  Filled 2017-10-02: qty 1

## 2017-10-02 NOTE — ED Triage Notes (Signed)
Patient states, "I was here yesterday and my anxiety is really bad."

## 2017-10-02 NOTE — Discharge Instructions (Addendum)
Please return for any problem. Please refrain from using drugs such as cocaine or heroin - they will only harm you in the long run. Please follow up as instructed in 3-4 days.

## 2017-10-02 NOTE — ED Notes (Signed)
Pt d/c and verbalized f/u instructions. Pt given bus pass.

## 2017-10-02 NOTE — ED Notes (Signed)
Patient left the ED Lobby per registration and was gone for a few minutes. Patient came back to registration and stated that she went to the "bus stop and felt too anxious." patient then went back to registration with her tongue out stating she could not breathe, but was able to talk in complete sentences.
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
## 2017-10-02 NOTE — ED Notes (Signed)
Bed: WTR8 Expected date:  Expected time:  Means of arrival:  Comments: 

## 2017-10-25 ENCOUNTER — Encounter (HOSPITAL_COMMUNITY): Payer: Self-pay | Admitting: Emergency Medicine

## 2017-10-25 ENCOUNTER — Emergency Department (HOSPITAL_COMMUNITY)
Admission: EM | Admit: 2017-10-25 | Discharge: 2017-10-25 | Disposition: A | Discharge status: Home / Self Care | Payer: Medicaid Other | Attending: Emergency Medicine | Admitting: Emergency Medicine

## 2017-10-25 ENCOUNTER — Other Ambulatory Visit: Payer: Self-pay

## 2017-10-25 DIAGNOSIS — F17210 Nicotine dependence, cigarettes, uncomplicated: Secondary | ICD-10-CM | POA: Insufficient documentation

## 2017-10-25 DIAGNOSIS — R451 Restlessness and agitation: Secondary | ICD-10-CM | POA: Insufficient documentation

## 2017-10-25 DIAGNOSIS — R4182 Altered mental status, unspecified: Secondary | ICD-10-CM | POA: Insufficient documentation

## 2017-10-25 DIAGNOSIS — F1410 Cocaine abuse, uncomplicated: Secondary | ICD-10-CM | POA: Insufficient documentation

## 2017-10-25 DIAGNOSIS — F1110 Opioid abuse, uncomplicated: Secondary | ICD-10-CM | POA: Insufficient documentation

## 2017-10-25 DIAGNOSIS — F1910 Other psychoactive substance abuse, uncomplicated: Secondary | ICD-10-CM

## 2017-10-25 DIAGNOSIS — Z79899 Other long term (current) drug therapy: Secondary | ICD-10-CM | POA: Insufficient documentation

## 2017-10-25 LAB — RAPID URINE DRUG SCREEN, HOSP PERFORMED
Amphetamines: NOT DETECTED
BENZODIAZEPINES: NOT DETECTED
Cocaine: POSITIVE — AB
Opiates: POSITIVE — AB
Tetrahydrocannabinol: POSITIVE — AB

## 2017-10-25 LAB — COMPREHENSIVE METABOLIC PANEL
ALBUMIN: 4.7 g/dL (ref 3.5–5.0)
ALT: 28 U/L (ref 0–44)
ANION GAP: 10 (ref 5–15)
AST: 29 U/L (ref 15–41)
Alkaline Phosphatase: 64 U/L (ref 38–126)
BILIRUBIN TOTAL: 1.1 mg/dL (ref 0.3–1.2)
BUN: 26 mg/dL — ABNORMAL HIGH (ref 6–20)
CO2: 28 mmol/L (ref 22–32)
Calcium: 9.8 mg/dL (ref 8.9–10.3)
Chloride: 105 mmol/L (ref 98–111)
Creatinine, Ser: 1.11 mg/dL — ABNORMAL HIGH (ref 0.44–1.00)
GFR calc Af Amer: >60 mL/min (ref >60)
GFR calc non Af Amer: >60 mL/min (ref >60)
Glucose, Bld: 81 mg/dL (ref 70–99)
POTASSIUM: 4.2 mmol/L (ref 3.5–5.1)
Sodium: 143 mmol/L (ref 135–145)
TOTAL PROTEIN: 8.8 g/dL — AB (ref 6.5–8.1)

## 2017-10-25 LAB — CBC
HCT: 35.7 % — ABNORMAL LOW (ref 36.0–46.0)
Hemoglobin: 11.9 g/dL — ABNORMAL LOW (ref 12.0–15.0)
MCH: 29.1 pg (ref 26.0–34.0)
MCHC: 33.3 g/dL (ref 30.0–36.0)
MCV: 87.3 fL (ref 78.0–100.0)
Platelets: 453 10*3/uL — ABNORMAL HIGH (ref 150–400)
RBC: 4.09 MIL/uL (ref 3.87–5.11)
RDW: 14.9 % (ref 11.5–15.5)
WBC: 10.1 10*3/uL (ref 4.0–10.5)

## 2017-10-25 LAB — CBG MONITORING, ED: GLUCOSE-CAPILLARY: 77 mg/dL (ref 70–99)

## 2017-10-25 LAB — SALICYLATE LEVEL: Salicylate Lvl: <7 mg/dL (ref 2.8–30.0)

## 2017-10-25 LAB — I-STAT BETA HCG BLOOD, ED (MC, WL, AP ONLY)

## 2017-10-25 LAB — ACETAMINOPHEN LEVEL: Acetaminophen (Tylenol), Serum: <10 ug/mL — ABNORMAL LOW (ref 10–30)

## 2017-10-25 LAB — ETHANOL: Alcohol, Ethyl (B): <10 mg/dL (ref <10)

## 2017-10-25 MED ORDER — IBUPROFEN 800 MG PO TABS
800.0000 mg | ORAL_TABLET | Freq: Once | ORAL | Status: AC
  Administered 2017-10-25: 800 mg via ORAL
  Filled 2017-10-25: qty 1

## 2017-10-25 MED ORDER — LORAZEPAM 2 MG/ML IJ SOLN
2.0000 mg | Freq: Once | INTRAMUSCULAR | Status: AC
  Administered 2017-10-25: 2 mg via INTRAMUSCULAR
  Filled 2017-10-25: qty 1

## 2017-10-25 MED ORDER — LORAZEPAM 0.5 MG PO TABS
0.5000 mg | ORAL_TABLET | Freq: Once | ORAL | Status: AC
  Administered 2017-10-25: 0.5 mg via ORAL
  Filled 2017-10-25: qty 1

## 2017-10-25 NOTE — ED Notes (Signed)
Patient is asleep. Sitter at bedside. Sitter orders are in as 1:1 observation.

## 2017-10-25 NOTE — ED Notes (Signed)
Patient eating crackers and able to tolerate PO fluid.

## 2017-10-25 NOTE — Progress Notes (Signed)
[facility] aware of consult for addiction services. CPSS provided pt with information. If additional needs arise, please reconsult CSW.   Montine CircleKelsy Nancyjo Givhan, Silverio LayLCSWA [facility]  [phone]

## 2017-10-25 NOTE — ED Notes (Signed)
Patient A/Ox4. Patient no longer thrashing around but still has arm and leg twitches. Patient following commands.

## 2017-10-25 NOTE — ED Notes (Signed)
Peer support will come back to assess patient when she is more alert.

## 2017-10-25 NOTE — ED Notes (Addendum)
Patient ambulated to bathroom.

## 2017-10-25 NOTE — ED Notes (Signed)
Patient still thrashing around. Unable to get vitals at this time.

## 2017-10-25 NOTE — ED Notes (Addendum)
Unable to keep cardiac monitor on patient. Patient still thrashing around when touched. Cardiac monitors would be a risk to patients health if patient were to get tangled. [facility]

## 2017-10-25 NOTE — ED Notes (Addendum)
Pt is looking for her pink puma flip flops. Shoes are not in bag with belongings that were given back to pt. All pt belonging cabinets were checked for shoes, none were found. Pt became agitated and refused to sign for discharge. Pt sat in wheelchair and requested something to eat on the way out. Pt was given some applesauce. Discharge papers were reviewed with pt.

## 2017-10-25 NOTE — Patient Outreach (Signed)
Pt was unable to be assessed at the moment. Pt was still under the influence at the time.  CPSS will follow up with Pt at a later time.

## 2017-10-25 NOTE — ED Notes (Signed)
Unable to obtain Vital signs or put pt on monitor at this time.

## 2017-10-25 NOTE — ED Notes (Signed)
Pt is requesting a sandwich and some cheese. Pt is A&O x4 at this time and is able to follow commands.

## 2017-10-25 NOTE — Patient Outreach (Signed)
CPSS made a few attempts to speak with Pt and to gain information from Pt. CPSS was unable to do so, CPSS left information for treatment services and contact information for Pt follow up with CPSS once Pt is in a better state.

## 2017-10-25 NOTE — Discharge Instructions (Addendum)
Your evaluated in the emergency department for your agitation in the setting of polysubstance abuse.  You were improved after some medication and some rest in the emergency department.  Your were valuated by peer support which discussed with you options for drug dependence.  There is help if you need it.  Please do not use dirty needles, this could cause you a severe infection to your skin, heart or spinal cord.  This could kill you or leave you permanently disabled.    [facility] [facility] to the Opioid Problem ([facility]STOP) Fixed; mobile; peer-based Anders GrantChase Holleman [phone] [email or web address] Fixed site exchange at [facility], Wednesdays (2-5pm) and Thursdays (3-8pm). [address] [place], [place] [phone] Call or text to arrange mobile and peer exchange, Mondays (1-4pm) and Fridays (4-7pm). [place]

## 2017-10-25 NOTE — ED Notes (Signed)
Pt was placed on bedpan but did not void at this time.
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
## 2017-10-25 NOTE — ED Notes (Signed)
Bed: [id number] Expected date:  Expected time:  Means of arrival:  Comments: EMS/heroin use
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
## 2017-10-25 NOTE — ED Notes (Signed)
Patient able to say her name and birth date. Patient unable to sit still and is throwing her self around. When asked what drugs she has taken she states,"everything". Sitter 1:1 observation ordered.

## 2017-10-25 NOTE — ED Notes (Signed)
Called [phone] in TCU. Peer support is gone for the day. Patient is too intoxicated to have a conversation and be assessed. Looks like patient will be boarding in the ED until tomorrow when patient can be re-evaluated for placement.

## 2017-10-25 NOTE — ED Notes (Signed)
Pt ambulated to bathroom with one assist.

## 2017-10-25 NOTE — ED Notes (Addendum)
Discontinued non-violent restraints. Patient no longer thrashing around and is trying to be corporative

## 2017-11-01 ENCOUNTER — Emergency Department (HOSPITAL_COMMUNITY)
Admission: EM | Admit: 2017-11-01 | Discharge: 2017-11-02 | Disposition: A | Discharge status: Home / Self Care | Payer: Self-pay | Attending: Emergency Medicine | Admitting: Emergency Medicine

## 2017-11-01 DIAGNOSIS — T401X4A Poisoning by heroin, undetermined, initial encounter: Secondary | ICD-10-CM | POA: Insufficient documentation

## 2017-11-01 DIAGNOSIS — R4182 Altered mental status, unspecified: Secondary | ICD-10-CM | POA: Insufficient documentation

## 2017-11-01 DIAGNOSIS — E876 Hypokalemia: Secondary | ICD-10-CM | POA: Insufficient documentation

## 2017-11-01 DIAGNOSIS — T405X4A Poisoning by cocaine, undetermined, initial encounter: Secondary | ICD-10-CM | POA: Insufficient documentation

## 2017-11-01 DIAGNOSIS — T50901A Poisoning by unspecified drugs, medicaments and biological substances, accidental (unintentional), initial encounter: Secondary | ICD-10-CM

## 2017-11-01 DIAGNOSIS — F1910 Other psychoactive substance abuse, uncomplicated: Secondary | ICD-10-CM

## 2017-11-01 LAB — I-STAT CHEM 8, ED
BUN: 12 mg/dL (ref 6–20)
CHLORIDE: 101 mmol/L (ref 98–111)
CREATININE: 1.2 mg/dL — AB (ref 0.44–1.00)
Calcium, Ion: 1.14 mmol/L — ABNORMAL LOW (ref 1.15–1.40)
Glucose, Bld: 114 mg/dL — ABNORMAL HIGH (ref 70–99)
HCT: 37 % (ref 36.0–46.0)
Hemoglobin: 12.6 g/dL (ref 12.0–15.0)
POTASSIUM: 2.6 mmol/L — AB (ref 3.5–5.1)
Sodium: 140 mmol/L (ref 135–145)
TCO2: 26 mmol/L (ref 22–32)

## 2017-11-01 LAB — I-STAT BETA HCG BLOOD, ED (MC, WL, AP ONLY)

## 2017-11-01 MED ORDER — POTASSIUM CHLORIDE 10 MEQ/100ML IV SOLN
10.0000 meq | INTRAVENOUS | Status: AC
  Administered 2017-11-01 – 2017-11-02 (×3): 10 meq via INTRAVENOUS
  Filled 2017-11-01 (×3): qty 100

## 2017-11-01 MED ORDER — LORAZEPAM 2 MG/ML IJ SOLN
1.0000 mg | Freq: Once | INTRAMUSCULAR | Status: AC
  Administered 2017-11-01: 2 mg via INTRAVENOUS

## 2017-11-01 MED ORDER — LORAZEPAM 2 MG/ML IJ SOLN
INTRAMUSCULAR | Status: AC
  Filled 2017-11-01: qty 1

## 2017-11-01 MED ORDER — ZIPRASIDONE MESYLATE 20 MG IM SOLR
10.0000 mg | Freq: Once | INTRAMUSCULAR | Status: AC
  Administered 2017-11-01: 10 mg via INTRAMUSCULAR
  Filled 2017-11-01: qty 20

## 2017-11-01 MED ORDER — STERILE WATER FOR INJECTION IJ SOLN
INTRAMUSCULAR | Status: AC
  Filled 2017-11-01: qty 10

## 2017-11-01 MED ORDER — POTASSIUM CHLORIDE CRYS ER 20 MEQ PO TBCR
40.0000 meq | EXTENDED_RELEASE_TABLET | Freq: Once | ORAL | Status: AC
  Administered 2017-11-02: 40 meq via ORAL
  Filled 2017-11-01: qty 2

## 2017-11-01 MED ORDER — SODIUM CHLORIDE 0.9 % IV BOLUS
1000.0000 mL | Freq: Once | INTRAVENOUS | Status: AC
  Administered 2017-11-01: 1000 mL via INTRAVENOUS

## 2017-11-01 NOTE — ED Provider Notes (Addendum)
[facility]   CSN: [id number] Arrival date & time: 11/01/17  1836     History   Chief Complaint Chief Complaint  Patient presents with  . Altered Mental Status  . Drug Overdose    HPI Kathy ReamsSarah Denise [name] is a 32 y.o. female.  HPI   32 yo F with PMHx chronic polysubstance abuse here with altered mental status.  Patient reportedly has been combative in route and received Versed.  She is unable to provide history.  According to EMS report, they were called out to a local hotel because the patient was found "acting up."  The patient was reportedly agitated.  They reportedly used cocaine and heroin.  Patient was initially agitated with EMS and received Versed.  She then became apneic and had bagging for several minutes.  She is now able to respond, but remains altered and confused.  She is unable to provide history on my assessment.  No past medical history on file.  There are no active problems to display for this patient.   PMHx: Polysubstance abuse  PSHx: non contributory   OB History   None      Home Medications    Prior to Admission medications   Not on File    Family History No family history on file.  Social History Social History   Tobacco Use  . Smoking status: Not on file  Substance Use Topics  . Alcohol use: Not on file  . Drug use: Not on file     Allergies   Acyclovir and related; Cyclobenzaprine; Doxycycline; Metoclopramide; Naproxen; Oxycodone-acetaminophen; Latex; and Tramadol   Review of Systems Review of Systems  Unable to perform ROS: Mental status change     Physical Exam Updated Vital Signs BP (!) 126/98   Pulse 94   Temp (!) 97.2 F (36.2 C) (Temporal)   Resp (!) 21   SpO2 100%   Physical Exam  Constitutional: She appears well-developed and well-nourished. She appears distressed.  Disheveled, intoxicated  HENT:  Head: Normocephalic and atraumatic.  Eyes:  Conjunctivae are normal.  Pupils pinpoint bilaterally  Neck: Neck supple.  Cardiovascular: Normal rate, regular rhythm and normal heart sounds. Exam reveals no friction rub.  No murmur heard. Pulmonary/Chest: Effort normal and breath sounds normal. No respiratory distress. She has no wheezes. She has no rales.  Abdominal: She exhibits no distension.  Musculoskeletal: She exhibits no edema.  Neurological: She exhibits normal muscle tone.  Intermittently agitated, then sonorous.  Moves all extremities.  Face appears symmetric.  She is able to intermittently ask questions and answer them appropriately, then becomes inappropriate.  Intermittently screaming.  Skin: Skin is warm. Capillary refill takes less than 2 seconds.  Psychiatric: She has a normal mood and affect.  Nursing note and vitals reviewed.    ED Treatments / Results  Labs (all labs ordered are listed, but only abnormal results are displayed) Labs Reviewed  I-STAT CHEM 8, ED - Abnormal; Notable for the following components:      Result Value   Potassium 2.6 (*)    Creatinine, Ser 1.20 (*)    Glucose, Bld 114 (*)    Calcium, Ion 1.14 (*)    All other components within normal limits  RAPID URINE DRUG SCREEN, HOSP PERFORMED  I-STAT BETA HCG BLOOD, ED ([facility], [facility], [facility] ONLY)    EKG EKG Interpretation  Date/Time:  Wednesday November 01 2017 18:43:27 EDT Ventricular Rate:  96 PR Interval:  QRS Duration: 76 QT Interval:  411 QTC Calculation: 520 R Axis:   39 Text Interpretation:  Sinus rhythm Consider right atrial enlargement Nonspecific T abnormalities, lateral leads Prolonged QT interval No old tracing to compare Confirmed by Shaune Pollack [phone]) on 11/01/2017 9:36:04 PM   Radiology No results found.  Procedures .Critical Care Performed by: Shaune Pollack, MD Authorized by: Shaune Pollack, MD   Critical care provider statement:    Critical care time (minutes):  35   Critical care time was exclusive of:  Separately  billable procedures and treating other patients and teaching time   Critical care was necessary to treat or prevent imminent or life-threatening deterioration of the following conditions:  Toxidrome   Critical care was time spent personally by me on the following activities:  Development of treatment plan with patient or surrogate, discussions with consultants, evaluation of patient's response to treatment, examination of patient, obtaining history from patient or surrogate, ordering and performing treatments and interventions, ordering and review of laboratory studies, ordering and review of radiographic studies, pulse oximetry, re-evaluation of patient's condition and review of old charts   I assumed direction of critical care for this patient from another provider in my specialty: no     (including critical care time)  Medications Ordered in ED Medications  potassium chloride 10 mEq in 100 mL IVPB (10 mEq Intravenous New Bag/Given 11/01/17 2236)  ziprasidone (GEODON) injection 10 mg (0 mg Intramuscular Hold 11/01/17 2123)  sodium chloride 0.9 % bolus 1,000 mL (1,000 mLs Intravenous New Bag/Given 11/01/17 1959)  LORazepam (ATIVAN) injection 1 mg (2 mg Intravenous Given 11/01/17 1950)     Initial Impression / Assessment and Plan / ED Course  I have reviewed the triage vital signs and the nursing notes.  Pertinent labs & imaging results that were available during my care of the patient were reviewed by me and considered in my medical decision making (see chart for details).     32 year old female here with polysubstance overdose.  Patient extremely agitated per EMS, but is now somnolent after receiving Versed.  IV placed.  Patient then became increasingly agitated, requiring four-point restraints, IV Ativan, and Geodon.  Patient now calm and cooperative.  Will plan to continue to monitor her for sobriety.  No apparent head trauma.  Lab work shows hypokalemia which is suspect is due to poor p.o.  intake, and she is been given potassium here.  Will give her p.o. potassium when she is clinically sober and able to eat.  Final Clinical Impressions(s) / ED Diagnoses   Final diagnoses:  Polysubstance abuse [facility])  Hypokalemia    ED Discharge Orders    None       Shaune Pollack, MD 11/01/17 2248    Shaune Pollack, MD 11/02/17 1616

## 2017-11-01 NOTE — ED Notes (Signed)
Soft wrist restraints applied to 4 extremities; two finger breadths fit under, slip knot applied to non-moving portion of bed frame; cardiac monitor reapplied to patient, will continue to monitor
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
## 2017-11-02 LAB — RAPID URINE DRUG SCREEN, HOSP PERFORMED
Amphetamines: NOT DETECTED
Benzodiazepines: POSITIVE — AB
Cocaine: POSITIVE — AB
OPIATES: POSITIVE — AB
Tetrahydrocannabinol: POSITIVE — AB

## 2017-11-02 MED ORDER — POTASSIUM CHLORIDE CRYS ER 20 MEQ PO TBCR: 20.0000 meq | EXTENDED_RELEASE_TABLET | Freq: Every day | ORAL | 0 refills | Status: DC

## 2017-11-02 NOTE — ED Provider Notes (Signed)
Patient seen by Dr. Erma HeritageIsaacs after unintentional overdose on multiple narcotics.  She has been monitored for an extended period of time, now is awake and alert.  She is ambulatory, safe for discharge.   Gilda CreasePollina, Christopher J, MD 11/02/17 [phone]

## 2017-11-02 NOTE — ED Notes (Addendum)
Pt. Ambulatory to bathroom and ate a [place] sandwich with apple sauce and a coke. MD made aware

## 2017-12-15 ENCOUNTER — Emergency Department (HOSPITAL_COMMUNITY)
Admission: EM | Admit: 2017-12-15 | Discharge: 2017-12-16 | Disposition: A | Discharge status: Home / Self Care | Payer: Self-pay | Attending: Emergency Medicine | Admitting: Emergency Medicine

## 2017-12-15 ENCOUNTER — Other Ambulatory Visit: Payer: Self-pay

## 2017-12-15 ENCOUNTER — Encounter (HOSPITAL_COMMUNITY): Payer: Self-pay | Admitting: Emergency Medicine

## 2017-12-15 DIAGNOSIS — W01198A Fall on same level from slipping, tripping and stumbling with subsequent striking against other object, initial encounter: Secondary | ICD-10-CM | POA: Insufficient documentation

## 2017-12-15 DIAGNOSIS — F17210 Nicotine dependence, cigarettes, uncomplicated: Secondary | ICD-10-CM | POA: Insufficient documentation

## 2017-12-15 DIAGNOSIS — K0889 Other specified disorders of teeth and supporting structures: Secondary | ICD-10-CM | POA: Insufficient documentation

## 2017-12-15 DIAGNOSIS — Z79899 Other long term (current) drug therapy: Secondary | ICD-10-CM | POA: Insufficient documentation

## 2017-12-15 DIAGNOSIS — S0990XA Unspecified injury of head, initial encounter: Secondary | ICD-10-CM | POA: Insufficient documentation

## 2017-12-15 DIAGNOSIS — Z91040 Latex allergy status: Secondary | ICD-10-CM | POA: Insufficient documentation

## 2017-12-15 DIAGNOSIS — Y939 Activity, unspecified: Secondary | ICD-10-CM | POA: Insufficient documentation

## 2017-12-15 DIAGNOSIS — Y999 Unspecified external cause status: Secondary | ICD-10-CM | POA: Insufficient documentation

## 2017-12-15 DIAGNOSIS — Y92002 Bathroom of unspecified non-institutional (private) residence single-family (private) house as the place of occurrence of the external cause: Secondary | ICD-10-CM | POA: Insufficient documentation

## 2017-12-15 NOTE — ED Notes (Signed)
No response when called for room 

## 2017-12-15 NOTE — ED Triage Notes (Signed)
Pt BIB [facility] with c/o mouth pain, hx dental caries. Pt reports that she has a headache. States she had a mechanical fall x 2 days ago hitting her head, denies LOC, A&O x 4, ambulatory without difficulty.

## 2017-12-16 ENCOUNTER — Other Ambulatory Visit: Payer: Self-pay

## 2017-12-16 ENCOUNTER — Encounter (HOSPITAL_COMMUNITY): Payer: Self-pay | Admitting: Emergency Medicine

## 2017-12-16 ENCOUNTER — Emergency Department (HOSPITAL_COMMUNITY): Payer: Self-pay

## 2017-12-16 ENCOUNTER — Emergency Department (HOSPITAL_COMMUNITY)
Admission: EM | Admit: 2017-12-16 | Discharge: 2017-12-16 | Disposition: A | Discharge status: Home / Self Care | Payer: Self-pay | Attending: Emergency Medicine | Admitting: Emergency Medicine

## 2017-12-16 DIAGNOSIS — F1910 Other psychoactive substance abuse, uncomplicated: Secondary | ICD-10-CM

## 2017-12-16 DIAGNOSIS — F32A Depression, unspecified: Secondary | ICD-10-CM

## 2017-12-16 DIAGNOSIS — F1420 Cocaine dependence, uncomplicated: Secondary | ICD-10-CM | POA: Insufficient documentation

## 2017-12-16 DIAGNOSIS — F17210 Nicotine dependence, cigarettes, uncomplicated: Secondary | ICD-10-CM | POA: Insufficient documentation

## 2017-12-16 DIAGNOSIS — F332 Major depressive disorder, recurrent severe without psychotic features: Secondary | ICD-10-CM | POA: Insufficient documentation

## 2017-12-16 DIAGNOSIS — F329 Major depressive disorder, single episode, unspecified: Secondary | ICD-10-CM

## 2017-12-16 DIAGNOSIS — R45851 Suicidal ideations: Secondary | ICD-10-CM | POA: Insufficient documentation

## 2017-12-16 LAB — CBC WITH DIFFERENTIAL/PLATELET
ABS IMMATURE GRANULOCYTES: 0 10*3/uL (ref 0.0–0.1)
BASOS ABS: 0 10*3/uL (ref 0.0–0.1)
BASOS PCT: 0 %
Eosinophils Absolute: 0.1 10*3/uL (ref 0.0–0.7)
Eosinophils Relative: 2 %
HCT: 34.4 % — ABNORMAL LOW (ref 36.0–46.0)
HEMOGLOBIN: 11.3 g/dL — AB (ref 12.0–15.0)
IMMATURE GRANULOCYTES: 0 %
LYMPHS PCT: 36 %
Lymphs Abs: 1.7 10*3/uL (ref 0.7–4.0)
MCH: 28.2 pg (ref 26.0–34.0)
MCHC: 32.8 g/dL (ref 30.0–36.0)
MCV: 85.8 fL (ref 78.0–100.0)
Monocytes Absolute: 0.4 10*3/uL (ref 0.1–1.0)
Monocytes Relative: 8 %
NEUTROS ABS: 2.4 10*3/uL (ref 1.7–7.7)
NEUTROS PCT: 54 %
Platelets: 538 10*3/uL — ABNORMAL HIGH (ref 150–400)
RBC: 4.01 MIL/uL (ref 3.87–5.11)
RDW: 14.6 % (ref 11.5–15.5)
WBC: 4.6 10*3/uL (ref 4.0–10.5)

## 2017-12-16 LAB — COMPREHENSIVE METABOLIC PANEL
ALBUMIN: 3.2 g/dL — AB (ref 3.5–5.0)
ALT: 20 U/L (ref 0–44)
ANION GAP: 7 (ref 5–15)
AST: 26 U/L (ref 15–41)
Alkaline Phosphatase: 63 U/L (ref 38–126)
BUN: 11 mg/dL (ref 6–20)
CHLORIDE: 110 mmol/L (ref 98–111)
CO2: 24 mmol/L (ref 22–32)
Calcium: 8.6 mg/dL — ABNORMAL LOW (ref 8.9–10.3)
Creatinine, Ser: 0.91 mg/dL (ref 0.44–1.00)
GFR calc Af Amer: >60 mL/min (ref >60)
GFR calc non Af Amer: >60 mL/min (ref >60)
GLUCOSE: 83 mg/dL (ref 70–99)
POTASSIUM: 3.5 mmol/L (ref 3.5–5.1)
Sodium: 141 mmol/L (ref 135–145)
Total Bilirubin: 0.6 mg/dL (ref 0.3–1.2)
Total Protein: 5.9 g/dL — ABNORMAL LOW (ref 6.5–8.1)

## 2017-12-16 LAB — ETHANOL

## 2017-12-16 LAB — I-STAT BETA HCG BLOOD, ED (MC, WL, AP ONLY): I-stat hCG, quantitative: <5 m[IU]/mL (ref <5)

## 2017-12-16 MED ORDER — PENICILLIN V POTASSIUM 500 MG PO TABS: 500.0000 mg | ORAL_TABLET | Freq: Four times a day (QID) | ORAL | 0 refills | Status: AC

## 2017-12-16 MED ORDER — IBUPROFEN 800 MG PO TABS
800.0000 mg | ORAL_TABLET | Freq: Once | ORAL | Status: AC
  Administered 2017-12-16: 800 mg via ORAL
  Filled 2017-12-16: qty 1

## 2017-12-16 NOTE — Discharge Instructions (Signed)
Choose a dentist from the attached list.  Return to the ED if you develop difficulty breathing, difficulty swallowing or other concerns.
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
## 2017-12-16 NOTE — Discharge Instructions (Addendum)
You were evaluated in the Emergency Department and after careful evaluation, we did not find any emergent condition requiring admission or further testing in the hospital. ° °Please return to the Emergency Department if you experience any worsening of your condition.  We encourage you to follow up with a primary care provider.  Thank you for allowing us to be a part of your care. °

## 2017-12-16 NOTE — ED Notes (Signed)
Patient transported to CT 

## 2017-12-16 NOTE — ED Triage Notes (Signed)
Pt. Stated, I need to talk to someone about being suicidal.  Pt left 2 hours ago after being D/C'd
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
## 2017-12-16 NOTE — ED Notes (Signed)
Pt given happy meal and sprite per request

## 2017-12-16 NOTE — ED Provider Notes (Signed)
[facility] Emergency Department Provider Note MRN:  [id number]  Arrival date & time: 12/16/17     Chief Complaint   Suicidal   History of Present Illness   [name] is a 32 y.o. year-old female with a history of depression presenting to the ED with chief complaint of suicidal ideation.  Patient endorsing worsening depression for the past 2 days.  Denies any specific triggers.  Endorsing constant suicidal thoughts during this time, with specific plan to walk in front of traffic.  Denies auditory or visual hallucinations, no HI.  Endorsing continued cocaine and heroin abuse.  Denies any specific bodily pain at this time.  Review of Systems  A complete 10 system review of systems was obtained and all systems are negative except as noted in the HPI and PMH.   Patient's Health History    Past Medical History:  Diagnosis Date  . Bartholin cyst   . Endometriosis   . Glaucoma   . History of PID   . Hx of migraines   . Ovarian cyst   . STD (female)    hx of chlamydia and gonorrhea  . Substance abuse (HCC)   . Vaginal Pap smear, abnormal    has not followed up    Past Surgical History:  Procedure Laterality Date  . DILATION AND CURETTAGE OF UTERUS    . FRACTURE SURGERY     left leg  . LAPAROSCOPY    . WISDOM TOOTH EXTRACTION      Family History  Problem Relation Age of Onset  . Diabetes Mother   . Hypertension Maternal Grandmother   . Heart disease Maternal Grandmother        great grandma  . Anesthesia problems Neg Hx   . Hypotension Neg Hx   . Malignant hyperthermia Neg Hx   . Pseudochol deficiency Neg Hx   . Alcohol abuse Neg Hx     Social History   Socioeconomic History  . Marital status: Single    Spouse name: Not on file  . Number of children: Not on file  . Years of education: Not on file  . Highest education level: Not on file  Occupational History  . Not on file  Social Needs  . Financial resource strain: Not on file  .  Food insecurity:    Worry: Not on file    Inability: Not on file  . Transportation needs:    Medical: Not on file    Non-medical: Not on file  Tobacco Use  . Smoking status: Current Every Day Smoker    Packs/day: 1.00    Years: 7.00    Pack years: 7.00    Types: Cigarettes  . Smokeless tobacco: Never Used  Substance and Sexual Activity  . Alcohol use: No    Comment: Denies ETOH use  . Drug use: Yes    Types: Marijuana, Cocaine, Heroin    Comment: heroin, cocaine, and mrijuana 3 days ago.  Marland Kitchen Sexual activity: Yes    Birth control/protection: None  Lifestyle  . Physical activity:    Days per week: Not on file    Minutes per session: Not on file  . Stress: Not on file  Relationships  . Social connections:    Talks on phone: Not on file    Gets together: Not on file    Attends religious service: Not on file    Active member of club or organization: Not on file    Attends meetings of clubs  or organizations: Not on file    Relationship status: Not on file  . Intimate partner violence:    Fear of current or ex partner: Not on file    Emotionally abused: Not on file    Physically abused: Not on file    Forced sexual activity: Not on file  Other Topics Concern  . Not on file  Social History Narrative  . Not on file     Physical Exam  Vital Signs and Nursing Notes reviewed Vitals:   12/16/17 0719  BP: 140/84  Pulse: 87  Resp: 20  Temp: 98.2 F (36.8 C)  SpO2: 98%    CONSTITUTIONAL: Well-appearing, NAD NEURO:  Alert and oriented x 3, no focal deficits EYES:  eyes equal and reactive ENT/NECK:  no LAD, no JVD CARDIO: Regular rate, well-perfused, normal S1 and S2 PULM:  CTAB no wheezing or rhonchi GI/GU:  normal bowel sounds, non-distended, non-tender MSK/SPINE:  No gross deformities, no edema SKIN:  no rash, atraumatic PSYCH: Depressed speech and behavior  Diagnostic and Interventional Summary    EKG Interpretation  Date/Time:    Ventricular Rate:    PR  Interval:    QRS Duration:   QT Interval:    QTC Calculation:   R Axis:     Text Interpretation:        Labs Reviewed  COMPREHENSIVE METABOLIC PANEL - Abnormal; Notable for the following components:      Result Value   Calcium 8.6 (*)    Total Protein 5.9 (*)    Albumin 3.2 (*)    All other components within normal limits  CBC WITH DIFFERENTIAL/PLATELET - Abnormal; Notable for the following components:   Hemoglobin 11.3 (*)    HCT 34.4 (*)    Platelets 538 (*)    All other components within normal limits  ETHANOL  RAPID URINE DRUG SCREEN, HOSP PERFORMED  I-STAT BETA HCG BLOOD, ED ([facility], [facility], [facility] ONLY)    No orders to display    Medications - No data to display   Procedures Critical Care  ED Course and Medical Decision Making  I have reviewed the triage vital signs and the nursing notes.  Pertinent labs & imaging results that were available during my care of the patient were reviewed by me and considered in my medical decision making (see below for details). Clinical Course as of Dec 17 934  Sat Dec 16, 2017  0800 Suicidal with specific plan, here voluntarily, TTS consulted, anticipating quick medical clearance and voluntary psych admission.   [MB]  [phone] Evaluated by TTS, per their evaluation patient was actually more interested in rehab from her drug addictions, her suicidality that she endorsed was intended to facilitate getting her an inpatient bed for detox.  Patient has been contracted for safety, promises not to kill herself, and if her thoughts return, she will return to the emergency department prior to acting on these thoughts.  [facility] has lined up outpatient resources, will discharge.After the discussed management above, the patient was determined to be safe for discharge.  The patient was in agreement with this plan and all questions regarding their care were answered.  ED return precautions were discussed and the patient will return to the ED with any significant  worsening of condition.   [MB]    Clinical Course User Index [MB] Pilar Plate Elmer Sow, MD     Elmer Sow. Pilar Plate, MD [facility] Emergency Medicine [facility] [email or web address]  Final Clinical Impressions(s) / ED Diagnoses  ICD-10-CM   1. Polysubstance abuse (HCC) F19.10   2. Depression, unspecified depression type F32.9   3. Suicidal thoughts R45.851     ED Discharge Orders    None         Sabas SousBero, Jenniferann Stuckert M, MD 12/16/17 [phone]
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
## 2017-12-16 NOTE — ED Notes (Signed)
Pt speaking with TTS 

## 2017-12-19 ENCOUNTER — Encounter (HOSPITAL_COMMUNITY): Payer: Self-pay | Admitting: Emergency Medicine

## 2017-12-19 ENCOUNTER — Emergency Department (HOSPITAL_COMMUNITY)
Admission: EM | Admit: 2017-12-19 | Discharge: 2017-12-20 | Disposition: A | Discharge status: Home / Self Care | Payer: Medicaid Other | Attending: Emergency Medicine | Admitting: Emergency Medicine

## 2017-12-19 ENCOUNTER — Other Ambulatory Visit: Payer: Self-pay

## 2017-12-19 DIAGNOSIS — F17210 Nicotine dependence, cigarettes, uncomplicated: Secondary | ICD-10-CM | POA: Insufficient documentation

## 2017-12-19 DIAGNOSIS — T7421XA Adult sexual abuse, confirmed, initial encounter: Secondary | ICD-10-CM

## 2017-12-19 DIAGNOSIS — R102 Pelvic and perineal pain: Secondary | ICD-10-CM | POA: Insufficient documentation

## 2017-12-19 DIAGNOSIS — F1910 Other psychoactive substance abuse, uncomplicated: Secondary | ICD-10-CM | POA: Insufficient documentation

## 2017-12-19 DIAGNOSIS — Z91040 Latex allergy status: Secondary | ICD-10-CM | POA: Insufficient documentation

## 2017-12-19 LAB — CBC WITH DIFFERENTIAL/PLATELET
BASOS PCT: 0 %
Basophils Absolute: 0 10*3/uL (ref 0.0–0.1)
Eosinophils Absolute: 0.2 10*3/uL (ref 0.0–0.7)
Eosinophils Relative: 2 %
HEMATOCRIT: 40.9 % (ref 36.0–46.0)
HEMOGLOBIN: 13.7 g/dL (ref 12.0–15.0)
LYMPHS ABS: 2.7 10*3/uL (ref 0.7–4.0)
LYMPHS PCT: 29 %
MCH: 28.7 pg (ref 26.0–34.0)
MCHC: 33.5 g/dL (ref 30.0–36.0)
MCV: 85.6 fL (ref 78.0–100.0)
Monocytes Absolute: 1 10*3/uL (ref 0.1–1.0)
Monocytes Relative: 10 %
NEUTROS ABS: 5.7 10*3/uL (ref 1.7–7.7)
NEUTROS PCT: 59 %
Platelets: 564 10*3/uL — ABNORMAL HIGH (ref 150–400)
RBC: 4.78 MIL/uL (ref 3.87–5.11)
RDW: 15.2 % (ref 11.5–15.5)
WBC: 9.5 10*3/uL (ref 4.0–10.5)

## 2017-12-19 LAB — I-STAT BETA HCG BLOOD, ED (MC, WL, AP ONLY): I-stat hCG, quantitative: <5 m[IU]/mL (ref <5)

## 2017-12-19 LAB — COMPREHENSIVE METABOLIC PANEL
ALBUMIN: 4.2 g/dL (ref 3.5–5.0)
ALT: 21 U/L (ref 0–44)
ANION GAP: 10 (ref 5–15)
AST: 29 U/L (ref 15–41)
Alkaline Phosphatase: 74 U/L (ref 38–126)
BUN: 10 mg/dL (ref 6–20)
CHLORIDE: 106 mmol/L (ref 98–111)
CO2: 29 mmol/L (ref 22–32)
Calcium: 9.4 mg/dL (ref 8.9–10.3)
Creatinine, Ser: 0.8 mg/dL (ref 0.44–1.00)
GFR calc Af Amer: >60 mL/min (ref >60)
GFR calc non Af Amer: >60 mL/min (ref >60)
GLUCOSE: 80 mg/dL (ref 70–99)
POTASSIUM: 3.8 mmol/L (ref 3.5–5.1)
SODIUM: 145 mmol/L (ref 135–145)
TOTAL PROTEIN: 8 g/dL (ref 6.5–8.1)
Total Bilirubin: 0.6 mg/dL (ref 0.3–1.2)

## 2017-12-19 LAB — RAPID URINE DRUG SCREEN, HOSP PERFORMED
Amphetamines: NOT DETECTED
Barbiturates: NOT DETECTED
Benzodiazepines: POSITIVE — AB
Cocaine: POSITIVE — AB
Opiates: POSITIVE — AB
Tetrahydrocannabinol: POSITIVE — AB

## 2017-12-19 LAB — ETHANOL: Alcohol, Ethyl (B): <10 mg/dL (ref <10)
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
## 2017-12-19 NOTE — ED Provider Notes (Signed)
Signout from Dr. Rubin PayorPickering.  Patient seen on prior shift and medicated for agitation.  Plan now is for SANE nurse evaluation and then likely discharge when this is completed.  Clinical Course as of Dec 21 1503  Tue Dec 19, 2017  1724 SANE nurse came by to see the patient but she was unable to stay awake for interview.  They said they would stop by later again to check on patient.   [MB]  2105 Rechecked on patient.  She is sleeping again although the tech at said she did ambulate to the bathroom once.  It sounds like SANE nurse has not been able to evaluate the patient because of her somnolence.   [MB]  2311 Apparently the SANE nurses come to evaluate the patient now that she is more alert and the patient is declining an evaluation.   [MB]    Clinical Course User Index [MB] Terrilee FilesButler, Michael C, MD      Terrilee FilesButler, Michael C, MD 12/20/17 [phone]

## 2017-12-19 NOTE — Consult Note (Signed)
Received a call from Dr. Rubin PayorPickering requesting SANE consult. He reported patient is awake, alert and oriented and could be discharged after consult.
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
## 2017-12-19 NOTE — Consult Note (Signed)
Called to check on patient status. Patient is reported to still be sleeping soundly. [facility].
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
## 2017-12-19 NOTE — SANE Note (Signed)
FNE contacted by Nurse Reagan at approximately 2230 and informed that patient had walked to bathroom.  FNE responded to patient room.  FNE explained dynamics of exam to patient.  Patient declined services at this time.  Declination form signed by patient at 2310.
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
## 2017-12-19 NOTE — ED Notes (Signed)
Patient refusing in and out cath at this time. Pure wick placed on patient.
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
## 2017-12-19 NOTE — Progress Notes (Signed)
12/19/17  1635  Patient refused to speak with SANE nurse.

## 2017-12-19 NOTE — SANE Note (Signed)
Called to check on status of patient.  Staff informed FNE that patient is still asleep.
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
## 2017-12-19 NOTE — ED Notes (Signed)
Pt declined SANE services.  Dr. Charm BargesButler informed.

## 2017-12-20 ENCOUNTER — Emergency Department (HOSPITAL_COMMUNITY)
Admission: EM | Admit: 2017-12-20 | Discharge: 2017-12-20 | Disposition: A | Discharge status: Home / Self Care | Payer: Medicaid Other | Attending: Emergency Medicine | Admitting: Emergency Medicine

## 2017-12-20 ENCOUNTER — Encounter (HOSPITAL_COMMUNITY): Payer: Self-pay | Admitting: Emergency Medicine

## 2017-12-20 DIAGNOSIS — F1210 Cannabis abuse, uncomplicated: Secondary | ICD-10-CM | POA: Insufficient documentation

## 2017-12-20 DIAGNOSIS — F1410 Cocaine abuse, uncomplicated: Secondary | ICD-10-CM | POA: Insufficient documentation

## 2017-12-20 DIAGNOSIS — R45851 Suicidal ideations: Secondary | ICD-10-CM | POA: Insufficient documentation

## 2017-12-20 DIAGNOSIS — F1120 Opioid dependence, uncomplicated: Secondary | ICD-10-CM | POA: Insufficient documentation

## 2017-12-20 DIAGNOSIS — Z91040 Latex allergy status: Secondary | ICD-10-CM | POA: Insufficient documentation

## 2017-12-20 DIAGNOSIS — F419 Anxiety disorder, unspecified: Secondary | ICD-10-CM | POA: Insufficient documentation

## 2017-12-20 DIAGNOSIS — F329 Major depressive disorder, single episode, unspecified: Secondary | ICD-10-CM | POA: Insufficient documentation

## 2017-12-20 DIAGNOSIS — F17210 Nicotine dependence, cigarettes, uncomplicated: Secondary | ICD-10-CM | POA: Insufficient documentation

## 2017-12-20 NOTE — ED Notes (Signed)
Bed: [facility]4 Expected date:  Expected time:  Means of arrival:  Comments: 
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
## 2017-12-20 NOTE — ED Notes (Signed)
Pt provided sandwich, grahams, milk & Sprite.  Pt transported to lobby via w/c.

## 2017-12-20 NOTE — ED Triage Notes (Signed)
Pt was sleeping in the lobby after being discharged last night, woke up went and told registration that she was suicidal, then went back to sleep in lobby until staff called her name for registration. Pt reports that she been having SI for couple days now bc she has court on 8/29. Reports that she "will probably walk out in to traffic".   Pt adds that she has headache.  Pt making grunting noises while being triaged.

## 2018-01-04 ENCOUNTER — Emergency Department (HOSPITAL_COMMUNITY)
Admission: EM | Admit: 2018-01-04 | Discharge: 2018-01-06 | Disposition: A | Discharge status: Other Acute Inpatient Hospital | Payer: Self-pay | Attending: Emergency Medicine | Admitting: Emergency Medicine

## 2018-01-04 ENCOUNTER — Encounter (HOSPITAL_COMMUNITY): Payer: Self-pay | Admitting: Emergency Medicine

## 2018-01-04 ENCOUNTER — Emergency Department (HOSPITAL_COMMUNITY): Payer: Self-pay

## 2018-01-04 DIAGNOSIS — F17210 Nicotine dependence, cigarettes, uncomplicated: Secondary | ICD-10-CM | POA: Insufficient documentation

## 2018-01-04 DIAGNOSIS — R4689 Other symptoms and signs involving appearance and behavior: Secondary | ICD-10-CM

## 2018-01-04 DIAGNOSIS — Z91040 Latex allergy status: Secondary | ICD-10-CM | POA: Insufficient documentation

## 2018-01-04 DIAGNOSIS — F1910 Other psychoactive substance abuse, uncomplicated: Secondary | ICD-10-CM | POA: Insufficient documentation

## 2018-01-04 DIAGNOSIS — R45851 Suicidal ideations: Secondary | ICD-10-CM | POA: Insufficient documentation

## 2018-01-04 DIAGNOSIS — R456 Violent behavior: Secondary | ICD-10-CM | POA: Insufficient documentation

## 2018-01-04 DIAGNOSIS — R4182 Altered mental status, unspecified: Secondary | ICD-10-CM | POA: Insufficient documentation

## 2018-01-04 DIAGNOSIS — F1920 Other psychoactive substance dependence, uncomplicated: Secondary | ICD-10-CM

## 2018-01-04 DIAGNOSIS — R451 Restlessness and agitation: Secondary | ICD-10-CM | POA: Insufficient documentation

## 2018-01-04 DIAGNOSIS — M791 Myalgia, unspecified site: Secondary | ICD-10-CM | POA: Insufficient documentation

## 2018-01-04 DIAGNOSIS — F1414 Cocaine abuse with cocaine-induced mood disorder: Secondary | ICD-10-CM | POA: Diagnosis present

## 2018-01-04 DIAGNOSIS — Z046 Encounter for general psychiatric examination, requested by authority: Secondary | ICD-10-CM | POA: Insufficient documentation

## 2018-01-04 DIAGNOSIS — F329 Major depressive disorder, single episode, unspecified: Secondary | ICD-10-CM | POA: Insufficient documentation

## 2018-01-04 DIAGNOSIS — F1120 Opioid dependence, uncomplicated: Secondary | ICD-10-CM | POA: Diagnosis present

## 2018-01-04 LAB — CBC WITH DIFFERENTIAL/PLATELET
BASOS ABS: 0 10*3/uL (ref 0.0–0.1)
BASOS PCT: 0 %
EOS PCT: 1 %
Eosinophils Absolute: 0.1 10*3/uL (ref 0.0–0.7)
HCT: 36.1 % (ref 36.0–46.0)
Hemoglobin: 12.2 g/dL (ref 12.0–15.0)
LYMPHS PCT: 17 %
Lymphs Abs: 2.2 10*3/uL (ref 0.7–4.0)
MCH: 28.5 pg (ref 26.0–34.0)
MCHC: 33.8 g/dL (ref 30.0–36.0)
MCV: 84.3 fL (ref 78.0–100.0)
MONO ABS: 1 10*3/uL (ref 0.1–1.0)
Monocytes Relative: 8 %
Neutro Abs: 9.2 10*3/uL — ABNORMAL HIGH (ref 1.7–7.7)
Neutrophils Relative %: 74 %
Platelets: 571 10*3/uL — ABNORMAL HIGH (ref 150–400)
RBC: 4.28 MIL/uL (ref 3.87–5.11)
RDW: 15.4 % (ref 11.5–15.5)
WBC: 12.4 10*3/uL — ABNORMAL HIGH (ref 4.0–10.5)

## 2018-01-04 LAB — ETHANOL

## 2018-01-04 LAB — COMPREHENSIVE METABOLIC PANEL
ALT: 56 U/L — ABNORMAL HIGH (ref 0–44)
AST: 47 U/L — AB (ref 15–41)
Albumin: 4.2 g/dL (ref 3.5–5.0)
Alkaline Phosphatase: 85 U/L (ref 38–126)
Anion gap: 12 (ref 5–15)
BILIRUBIN TOTAL: 1 mg/dL (ref 0.3–1.2)
BUN: 16 mg/dL (ref 6–20)
CO2: 24 mmol/L (ref 22–32)
CREATININE: 1.08 mg/dL — AB (ref 0.44–1.00)
Calcium: 9.5 mg/dL (ref 8.9–10.3)
Chloride: 108 mmol/L (ref 98–111)
GFR calc Af Amer: >60 mL/min (ref >60)
Glucose, Bld: 102 mg/dL — ABNORMAL HIGH (ref 70–99)
Potassium: 4.1 mmol/L (ref 3.5–5.1)
Sodium: 144 mmol/L (ref 135–145)
Total Protein: 7.7 g/dL (ref 6.5–8.1)

## 2018-01-04 LAB — SALICYLATE LEVEL: Salicylate Lvl: <7 mg/dL (ref 2.8–30.0)

## 2018-01-04 LAB — I-STAT BETA HCG BLOOD, ED (MC, WL, AP ONLY): I-stat hCG, quantitative: <5 m[IU]/mL (ref <5)

## 2018-01-04 LAB — ACETAMINOPHEN LEVEL: Acetaminophen (Tylenol), Serum: <10 ug/mL — ABNORMAL LOW (ref 10–30)

## 2018-01-04 MED ORDER — STERILE WATER FOR INJECTION IJ SOLN
INTRAMUSCULAR | Status: AC
  Administered 2018-01-04: 10 mL
  Filled 2018-01-04: qty 10

## 2018-01-04 MED ORDER — ZIPRASIDONE MESYLATE 20 MG IM SOLR
20.0000 mg | Freq: Once | INTRAMUSCULAR | Status: AC
  Administered 2018-01-04: 20 mg via INTRAMUSCULAR
  Filled 2018-01-04: qty 20

## 2018-01-04 NOTE — Progress Notes (Signed)
TTS went to assess pt. Pt is sleeping and unable to be aroused. TTS to assess once pt is alert. Per triage, Patient found rolling around in grass. Given Versed 5mg  IM.  Princess BruinsAquicha Dantavious Snowball, MSW, LCSW Therapeutic Triage Specialist  [phone]
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
## 2018-01-04 NOTE — ED Triage Notes (Signed)
[facility] of [facility] behavior. Patient found rolling around in grass. Given Versed 5mg  IM.

## 2018-01-05 LAB — RAPID URINE DRUG SCREEN, HOSP PERFORMED
AMPHETAMINES: NOT DETECTED
Barbiturates: NOT DETECTED
Benzodiazepines: POSITIVE — AB
Cocaine: POSITIVE — AB
Opiates: POSITIVE — AB
TETRAHYDROCANNABINOL: NOT DETECTED

## 2018-01-05 MED ORDER — MIRTAZAPINE 30 MG PO TABS
15.0000 mg | ORAL_TABLET | Freq: Every day | ORAL | Status: DC
  Administered 2018-01-05: 15 mg via ORAL
  Filled 2018-01-05: qty 1

## 2018-01-05 MED ORDER — VENLAFAXINE HCL ER 75 MG PO CP24
75.0000 mg | ORAL_CAPSULE | Freq: Every day | ORAL | Status: DC
  Administered 2018-01-06: 75 mg via ORAL
  Filled 2018-01-05: qty 1

## 2018-01-05 MED ORDER — HALOPERIDOL 2 MG PO TABS
2.0000 mg | ORAL_TABLET | Freq: Two times a day (BID) | ORAL | Status: DC
  Administered 2018-01-05 – 2018-01-06 (×3): 2 mg via ORAL
  Filled 2018-01-05 (×4): qty 1

## 2018-01-05 MED ORDER — IBUPROFEN 200 MG PO TABS
600.0000 mg | ORAL_TABLET | Freq: Four times a day (QID) | ORAL | Status: DC | PRN
  Administered 2018-01-05 (×3): 600 mg via ORAL
  Filled 2018-01-05 (×2): qty 3

## 2018-01-05 MED ORDER — HYDROXYZINE HCL 25 MG PO TABS
25.0000 mg | ORAL_TABLET | Freq: Four times a day (QID) | ORAL | Status: DC | PRN
  Administered 2018-01-05 – 2018-01-06 (×2): 25 mg via ORAL
  Filled 2018-01-05 (×2): qty 1
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
## 2018-01-05 NOTE — Progress Notes (Signed)
Pt still sleeping. Unable to be assessed. TTS attempted to speak with pt's nurse, no answer when called.   Princess BruinsAquicha Magdeline Prange, MSW, LCSW Therapeutic Triage Specialist  [phone]'

## 2018-01-05 NOTE — ED Provider Notes (Signed)
TTS recommends inpatient.   Roxy HorsemanBrowning, Johntavius Shepard, PA-C 01/05/18 [phone]
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
## 2018-01-05 NOTE — ED Notes (Signed)
Patient currently denies SI/HI/AVH. Sandwich and soda given. Plan of care discussed. Encouragement and support provided and safety maintain. Q 15 min safety checks in place and video monitoring.

## 2018-01-06 ENCOUNTER — Inpatient Hospital Stay (HOSPITAL_COMMUNITY)
Admission: AD | Admit: 2018-01-06 | Discharge: 2018-01-08 | DRG: 897 | Disposition: A | Discharge status: Home / Self Care | Payer: Federal, State, Local not specified - Other | Source: Intra-hospital | Attending: Psychiatry | Admitting: Psychiatry

## 2018-01-06 ENCOUNTER — Other Ambulatory Visit: Payer: Self-pay

## 2018-01-06 ENCOUNTER — Encounter (HOSPITAL_COMMUNITY): Payer: Self-pay | Admitting: *Deleted

## 2018-01-06 DIAGNOSIS — F329 Major depressive disorder, single episode, unspecified: Secondary | ICD-10-CM | POA: Diagnosis present

## 2018-01-06 DIAGNOSIS — F17210 Nicotine dependence, cigarettes, uncomplicated: Secondary | ICD-10-CM | POA: Diagnosis present

## 2018-01-06 DIAGNOSIS — H409 Unspecified glaucoma: Secondary | ICD-10-CM | POA: Diagnosis present

## 2018-01-06 DIAGNOSIS — F1320 Sedative, hypnotic or anxiolytic dependence, uncomplicated: Secondary | ICD-10-CM | POA: Diagnosis present

## 2018-01-06 DIAGNOSIS — F1120 Opioid dependence, uncomplicated: Secondary | ICD-10-CM | POA: Diagnosis present

## 2018-01-06 DIAGNOSIS — F1414 Cocaine abuse with cocaine-induced mood disorder: Secondary | ICD-10-CM

## 2018-01-06 DIAGNOSIS — Z79899 Other long term (current) drug therapy: Secondary | ICD-10-CM

## 2018-01-06 DIAGNOSIS — Z590 Homelessness: Secondary | ICD-10-CM | POA: Diagnosis not present

## 2018-01-06 DIAGNOSIS — F4310 Post-traumatic stress disorder, unspecified: Secondary | ICD-10-CM | POA: Diagnosis present

## 2018-01-06 DIAGNOSIS — Z8249 Family history of ischemic heart disease and other diseases of the circulatory system: Secondary | ICD-10-CM | POA: Diagnosis not present

## 2018-01-06 DIAGNOSIS — Z8742 Personal history of other diseases of the female genital tract: Secondary | ICD-10-CM

## 2018-01-06 DIAGNOSIS — F1290 Cannabis use, unspecified, uncomplicated: Secondary | ICD-10-CM

## 2018-01-06 DIAGNOSIS — F1994 Other psychoactive substance use, unspecified with psychoactive substance-induced mood disorder: Secondary | ICD-10-CM | POA: Diagnosis not present

## 2018-01-06 DIAGNOSIS — R45851 Suicidal ideations: Secondary | ICD-10-CM | POA: Diagnosis present

## 2018-01-06 DIAGNOSIS — Z881 Allergy status to other antibiotic agents status: Secondary | ICD-10-CM | POA: Diagnosis not present

## 2018-01-06 DIAGNOSIS — G4700 Insomnia, unspecified: Secondary | ICD-10-CM | POA: Diagnosis present

## 2018-01-06 DIAGNOSIS — D573 Sickle-cell trait: Secondary | ICD-10-CM | POA: Diagnosis present

## 2018-01-06 DIAGNOSIS — Z813 Family history of other psychoactive substance abuse and dependence: Secondary | ICD-10-CM

## 2018-01-06 DIAGNOSIS — Z814 Family history of other substance abuse and dependence: Secondary | ICD-10-CM | POA: Diagnosis not present

## 2018-01-06 DIAGNOSIS — Z886 Allergy status to analgesic agent status: Secondary | ICD-10-CM | POA: Diagnosis not present

## 2018-01-06 DIAGNOSIS — Z888 Allergy status to other drugs, medicaments and biological substances status: Secondary | ICD-10-CM

## 2018-01-06 DIAGNOSIS — Z833 Family history of diabetes mellitus: Secondary | ICD-10-CM

## 2018-01-06 DIAGNOSIS — Z885 Allergy status to narcotic agent status: Secondary | ICD-10-CM

## 2018-01-06 DIAGNOSIS — Z9889 Other specified postprocedural states: Secondary | ICD-10-CM

## 2018-01-06 DIAGNOSIS — Z91040 Latex allergy status: Secondary | ICD-10-CM

## 2018-01-06 MED ORDER — DICYCLOMINE HCL 20 MG PO TABS
20.0000 mg | ORAL_TABLET | Freq: Four times a day (QID) | ORAL | Status: DC | PRN
  Administered 2018-01-06: 20 mg via ORAL
  Filled 2018-01-06: qty 1

## 2018-01-06 MED ORDER — VENLAFAXINE HCL ER 75 MG PO CP24
75.0000 mg | ORAL_CAPSULE | Freq: Every day | ORAL | Status: DC
  Administered 2018-01-07 – 2018-01-08 (×2): 75 mg via ORAL
  Filled 2018-01-06 (×3): qty 1
  Filled 2018-01-06: qty 7

## 2018-01-06 MED ORDER — ALUM & MAG HYDROXIDE-SIMETH 200-200-20 MG/5ML PO SUSP: 30.0000 mL | ORAL | Status: DC | PRN

## 2018-01-06 MED ORDER — DICYCLOMINE HCL 20 MG PO TABS
20.0000 mg | ORAL_TABLET | Freq: Four times a day (QID) | ORAL | Status: DC | PRN
  Administered 2018-01-07 – 2018-01-08 (×2): 20 mg via ORAL
  Filled 2018-01-06 (×2): qty 1

## 2018-01-06 MED ORDER — LOPERAMIDE HCL 2 MG PO CAPS: 2.0000 mg | ORAL_CAPSULE | ORAL | Status: DC | PRN

## 2018-01-06 MED ORDER — METHOCARBAMOL 500 MG PO TABS
500.0000 mg | ORAL_TABLET | Freq: Three times a day (TID) | ORAL | Status: DC | PRN
  Administered 2018-01-06: 500 mg via ORAL
  Filled 2018-01-06: qty 1

## 2018-01-06 MED ORDER — HALOPERIDOL 2 MG PO TABS
2.0000 mg | ORAL_TABLET | Freq: Two times a day (BID) | ORAL | Status: DC
  Administered 2018-01-07 – 2018-01-08 (×3): 2 mg via ORAL
  Filled 2018-01-06 (×4): qty 1
  Filled 2018-01-06: qty 14
  Filled 2018-01-06 (×2): qty 1
  Filled 2018-01-06: qty 14

## 2018-01-06 MED ORDER — CLONIDINE HCL 0.1 MG PO TABS
0.1000 mg | ORAL_TABLET | ORAL | Status: DC
  Filled 2018-01-06 (×4): qty 1

## 2018-01-06 MED ORDER — ONDANSETRON 4 MG PO TBDP: 4.0000 mg | ORAL_TABLET | Freq: Four times a day (QID) | ORAL | Status: DC | PRN

## 2018-01-06 MED ORDER — HYDROXYZINE HCL 25 MG PO TABS: 25.0000 mg | ORAL_TABLET | Freq: Four times a day (QID) | ORAL | Status: DC | PRN

## 2018-01-06 MED ORDER — CLONIDINE HCL 0.1 MG PO TABS
0.1000 mg | ORAL_TABLET | Freq: Four times a day (QID) | ORAL | Status: DC
  Administered 2018-01-06: 0.1 mg via ORAL
  Filled 2018-01-06: qty 1

## 2018-01-06 MED ORDER — ONDANSETRON 4 MG PO TBDP
4.0000 mg | ORAL_TABLET | Freq: Four times a day (QID) | ORAL | Status: DC | PRN
  Administered 2018-01-08: 4 mg via ORAL
  Filled 2018-01-06: qty 1

## 2018-01-06 MED ORDER — HYDROXYZINE HCL 25 MG PO TABS: 25.0000 mg | ORAL_TABLET | Freq: Three times a day (TID) | ORAL | Status: DC | PRN

## 2018-01-06 MED ORDER — CLONIDINE HCL 0.1 MG PO TABS: 0.1000 mg | ORAL_TABLET | ORAL | Status: DC

## 2018-01-06 MED ORDER — CLONIDINE HCL 0.1 MG PO TABS
0.1000 mg | ORAL_TABLET | Freq: Four times a day (QID) | ORAL | Status: AC
  Administered 2018-01-06 – 2018-01-08 (×6): 0.1 mg via ORAL
  Filled 2018-01-06 (×9): qty 1

## 2018-01-06 MED ORDER — HYDROXYZINE HCL 25 MG PO TABS
25.0000 mg | ORAL_TABLET | Freq: Four times a day (QID) | ORAL | Status: DC | PRN
  Administered 2018-01-06 – 2018-01-08 (×4): 25 mg via ORAL
  Filled 2018-01-06 (×3): qty 1
  Filled 2018-01-06: qty 10
  Filled 2018-01-06 (×2): qty 1

## 2018-01-06 MED ORDER — TRAZODONE HCL 50 MG PO TABS
50.0000 mg | ORAL_TABLET | Freq: Every evening | ORAL | Status: DC | PRN
  Administered 2018-01-06 – 2018-01-07 (×2): 50 mg via ORAL
  Filled 2018-01-06: qty 7
  Filled 2018-01-06 (×2): qty 1

## 2018-01-06 MED ORDER — MAGNESIUM HYDROXIDE 400 MG/5ML PO SUSP: 30.0000 mL | Freq: Every day | ORAL | Status: DC | PRN

## 2018-01-06 MED ORDER — METHOCARBAMOL 500 MG PO TABS
500.0000 mg | ORAL_TABLET | Freq: Three times a day (TID) | ORAL | Status: DC | PRN
  Administered 2018-01-06 – 2018-01-08 (×4): 500 mg via ORAL
  Filled 2018-01-06 (×5): qty 1

## 2018-01-06 MED ORDER — CLONIDINE HCL 0.1 MG PO TABS: 0.1000 mg | ORAL_TABLET | Freq: Every day | ORAL | Status: DC

## 2018-01-06 NOTE — Tx Team (Signed)
Initial Treatment Plan 01/06/2018 6:09 PM [name] [id number]    PATIENT STRESSORS: Medication change or noncompliance Substance abuse   PATIENT STRENGTHS: Ability for insight General fund of knowledge   PATIENT IDENTIFIED PROBLEMS: Depression     Substance Abuse                 DISCHARGE CRITERIA:  Improved stabilization in mood, thinking, and/or behavior Need for constant or close observation no longer present  PRELIMINARY DISCHARGE PLAN: Return to previous living arrangement  PATIENT/FAMILY INVOLVEMENT: This treatment plan has been presented to and reviewed with the patient, Kathy ReamsSarah Denise [name], and/or family member.  The patient and family have been given the opportunity to ask questions and make suggestions.  Buford DresserForrest, Coreena Rubalcava Shanta, RN 01/06/2018, 6:09 PM

## 2018-01-06 NOTE — Progress Notes (Signed)
Patient ID: [name], female   DOB: 08-11-1985, 32 y.o.   MRN: [id number]   32 year old black female admitted [facility] that she was homeless and needed some help. At time of admission pt was flat, depressed, irritable, and guarded. Pt would not engage in admission, she reported no to everything. Pt reported that she was not depressed, no on any drugs or alcohol. Pt reported that she just wanted to be left alone, she just wanted to go to bed. This writer attempted to ask questions pt started crying and would not answer anything else. Pt was positive for benzos, opiates, and cocaine. Pt was brought to unit, once on the unit she went straight to bed and went to sleep. No issues or concerns noted.
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
## 2018-01-06 NOTE — ED Notes (Signed)
Patient reports she feels bad today.  When asked by nurse to describe how she feels patient reported that she "aches all over."  Patient thinks she is withdrawing from heroin and requested her prn vistaril to help her rest.

## 2018-01-06 NOTE — Progress Notes (Signed)
[facility]  Bed: 300-2  Call for report: [phone]  Enid Cutterharlotte Shaquanda Graves, LCSW-A Clinical Social Worker [phone]

## 2018-01-06 NOTE — Progress Notes (Signed)
Patient has been in bed resting, she didinot attend group and writer had to wake her in order to see how she was doing. She c/o having withdrawals from heroin and was aching, cramping and sweating. She received prn for this and returned to her room to rest. Safety maintained on unit with 15 min checks.

## 2018-01-06 NOTE — ED Notes (Signed)
[facility], Moshe SalisburyMichael RN.  Nurse advised not to send patient until 5pm.

## 2018-01-07 DIAGNOSIS — Z813 Family history of other psychoactive substance abuse and dependence: Secondary | ICD-10-CM

## 2018-01-07 DIAGNOSIS — F1994 Other psychoactive substance use, unspecified with psychoactive substance-induced mood disorder: Secondary | ICD-10-CM

## 2018-01-07 MED ORDER — CHLORDIAZEPOXIDE HCL 25 MG PO CAPS: 25.0000 mg | ORAL_CAPSULE | Freq: Four times a day (QID) | ORAL | Status: DC | PRN

## 2018-01-07 NOTE — [facility] Group Notes (Signed)
[facility] Group Notes:  (Nursing/MHT/Case Management/Adjunct)  Date:  01/07/2018  Time:  3:16 PM  Type of Therapy:  Played a non-competitive learning/communication board game that foster learning skills as well as self expression.  Participation Level:  Did not attend  Participation Quality:  N/A  Affect:  N/A  Cognitive:  N/A  Insight:  N/A  Engagement in Group:  N/A  Modes of Intervention:  N/A  Summary of Progress/Problems:  Starleen BlueDoris  Nea Gittens 01/07/2018, 3:16 PM
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
## 2018-01-07 NOTE — [facility] Group Notes (Signed)
Pt was invited but did not attend therapeutic/relaxation group. 

## 2018-01-07 NOTE — [facility] Group Notes (Signed)
Pt was invited but did not attend orientation/goals group. 

## 2018-01-07 NOTE — Progress Notes (Signed)
Patient has been isolative to her room. She did not attend AA meeting and only got up to request her medication and got a snack before returning to her room. Patient was given a pitcher of gatorade and encouraged to drink it to keep herself hydrated due to medications received tonight. Writer encouraged her shower and she reported that she doesn't feel like it. Support given and safety maintained on unit with 15 min checks.
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
## 2018-01-07 NOTE — [facility] Group Notes (Signed)
[facility] LCSW Group Therapy Note  Date/Time:  01/07/2018 10:00-11:00AM  Type of Therapy and Topic:  Group Therapy:  Healthy and Unhealthy Supports  Participation Level:  Did Not Attend   Description of Group:  Patients in this group were introduced to the idea of adding a variety of healthy supports to address the various needs in their lives.Patients discussed what additional healthy supports could be helpful in their recovery and wellness after discharge in order to prevent future hospitalizations.   An emphasis was placed on using counselor, doctor, therapy groups, 12-step groups, and problem-specific support groups to expand supports.  They also worked as a group on developing a specific plan for several patients to deal with unhealthy supports through boundary-setting, psychoeducation with loved ones, and even termination of relationships.   Therapeutic Goals:   1)  discuss importance of adding supports to stay well once out of the hospital  2)  compare healthy versus unhealthy supports and identify some examples of each  3)  generate ideas and descriptions of healthy supports that can be added  4)  offer mutual support about how to address unhealthy supports  5)  encourage active participation in and adherence to discharge plan    Summary of Patient Progress:  Did not attend Therapeutic Modalities:   Motivational Interviewing Brief Solution-Focused Therapy  Karesha Trzcinski D Sheilia Reznick         

## 2018-01-07 NOTE — Progress Notes (Deleted)
Patient ID: Kathy ReamsSarah Denise [name], female   DOB: March 25, 1986, 32 y.o.   MRN: [id number] Pt's dressing changes completed. Epidermis scraped off from left knee to calf area, right knee also with scraped off skin, b/l hip areas with scraped epidermal areas as will as other smaller areas of road rash to the b/l lower and upper extremities.  Old dressings taken off, areas cleansed with saline, silvadene oitment applied as ordered, areas covered with vaseline gauze, sterile 4x4 dressings and ABD dressings.  Pt was medicated with Atarax 50mg , Tylenol 650mg  and Oxycodone 5mg  40minutes prior to dressing changes and she tolerated dressing changes well.  Q15 minute checks remain in place for safety, will continue to monitor.
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
## 2018-01-07 NOTE — Progress Notes (Signed)
Patient ID: [name], female   DOB: 04-30-1985, 32 y.o.   MRN: [id number]  D: Patient denies SI/HI/AVH, complained of generalized aches and pain of 8/10 related to her withdrawals from substances of abuse, and was observed to have frequent sneezing, runny nose, and restlessness.  Pt is reclusive to her room, reports that she ate minimal breakfast.  A: Pt was medicated with all meds as scheduled, and was medicated with Robaxin 400mg  for generalized body aches, Bentyl for complaints of stomach cramps, and Clonidine 0.1mg  for opiate withdrawal symptoms. Pt is being monitored on Q15 minute checks for safety.  R: Will continue to monitor on Q15 minute safety checks.

## 2018-01-08 MED ORDER — TRAZODONE HCL 50 MG PO TABS: 50.0000 mg | ORAL_TABLET | Freq: Every evening | ORAL | 0 refills | Status: AC | PRN

## 2018-01-08 MED ORDER — HYDROXYZINE HCL 25 MG PO TABS: 25.0000 mg | ORAL_TABLET | Freq: Four times a day (QID) | ORAL | 0 refills | Status: AC | PRN

## 2018-01-08 MED ORDER — NICOTINE 21 MG/24HR TD PT24
21.0000 mg | MEDICATED_PATCH | Freq: Every day | TRANSDERMAL | Status: DC
  Administered 2018-01-08: 21 mg via TRANSDERMAL
  Filled 2018-01-08 (×2): qty 1

## 2018-01-08 MED ORDER — HALOPERIDOL 2 MG PO TABS: 2.0000 mg | ORAL_TABLET | Freq: Two times a day (BID) | ORAL | 0 refills | Status: AC

## 2018-01-08 MED ORDER — VENLAFAXINE HCL ER 75 MG PO CP24: 75.0000 mg | ORAL_CAPSULE | Freq: Every day | ORAL | 0 refills | Status: AC

## 2018-01-08 NOTE — Progress Notes (Signed)
D:  Patient's self inventory sheet, patient has fair sleep, sleep medication helpful.  Good appetite, normal energy level, good concentration.  Denied depression and hopeless, rated anxiety 3.  Denied withdrawals.  Denied SI.  Denied physical problems.  Denied physical pain.  Pain medication helpful.  Goal is discharge.  Plans to talk to nurses/ MDs.  Does have discharge plans. A:  Medications administered per MD orders.  Emotional support and encouragement given patient. R:  Patient denied SI and HI, contracts for safety.  Denied A/V hallucinations.  Safety maintained with 15 minute checks. Patient excited to be discharged today.

## 2018-01-08 NOTE — Progress Notes (Signed)
Recreation Therapy Notes  Date: 9.16.19 Time: 0930 Location: [address] Dayroom  Group Topic: Stress Management  Goal Area(s) Addresses:  Patient will verbalize importance of using healthy stress management.  Patient will identify positive emotions associated with healthy stress management.   Intervention: Stress Management  Activity : Guided Imagery.  LRT introduced the stress management technique of guided imagery.  LRT read [name] script for [name] forest meditation.  Patients were to follow along as the script was read to engage in the activity.  Education:  Stress Management, Discharge Planning.   Education Outcome: Acknowledges edcuation/In group clarification offered/Needs additional education  Clinical Observations/Feedback: Pt did not attend group.    [name], LRT/CTRS         [name] 01/08/2018 12:15 PM 

## 2018-01-08 NOTE — Progress Notes (Signed)
Discharge Note:  Patient discharged with bus ticket.  Patient denied SI and HI.  Denied A/V hallucinations.  Denied pain.  Suicide prevention information given and discussed with patient who stated she understood and had no questions.  Suicide information given with phone numbers and web sites for suicide prevention for patient to keep, review and use if needed.  Patient stated she received all her belongings, clothing, toiletries, misc items, prescriptions, medications, etc.  Patient stated she [facility] all [facility].  All required discharge information given to patient at discharge. Patient filled out survey and put in black box.

## 2018-01-08 NOTE — Progress Notes (Signed)
Patient did not attend the evening speaker AA meeting. Pt was notified that group was beginning but remained in bed.   
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
## 2018-01-08 NOTE — Progress Notes (Signed)
  [facility] :  Will you be returning to the same living situation after discharge:  Yes,  home At discharge, do you have transportation home?: Yes,  bus Do you have the ability to pay for your medications: Yes,  mental health  Release of information consent forms completed and submitted to medical records by CSW.   Patient to Follow up at: Follow-up Information    Patient declined referrals and plans to follow-up on her own. Follow up.           [facility] care [facility]  Safety Planning and Suicide Prevention discussed: Yes,  SPE completed with pt; pt declined to consent to collateral contact.   Have you used any form of tobacco in the last 30 days? (Cigarettes, Smokeless Tobacco, Cigars, and/or Pipes): No  Has patient been referred to the Quitline?: N/A patient is not a smoker  Patient has been referred for addiction treatment: Pt. refused referral  Rona RavensHeather S Marnie Fazzino, LCSW 01/08/2018, 1:03 PM

## 2018-01-08 NOTE — Tx Team (Addendum)
Interdisciplinary Treatment and Diagnostic Plan Update  01/08/2018 Time of Session: 1610RU [name] MRN: [id number]  Principal Diagnosis: Substance Induced Mood Disorder  Secondary Diagnoses: Active Problems:   Substance induced mood disorder (HCC)   Current Medications:  Current Facility-Administered Medications  Medication Dose Route Frequency Provider Last Rate Last Dose  . alum & mag hydroxide-simeth (MAALOX/MYLANTA) 200-200-20 MG/5ML suspension 30 mL  30 mL Oral Q4H PRN Laveda Abbe, NP      . chlordiazePOXIDE (LIBRIUM) capsule 25 mg  25 mg Oral QID PRN Antonieta Pert, MD      . cloNIDine (CATAPRES) tablet 0.1 mg  0.1 mg Oral QID Laveda Abbe, NP   0.1 mg at 01/07/18 2116   Followed by  . cloNIDine (CATAPRES) tablet 0.1 mg  0.1 mg Oral BH-qamhs Laveda Abbe, NP       Followed by  . [START ON 01/11/2018] cloNIDine (CATAPRES) tablet 0.1 mg  0.1 mg Oral QAC breakfast Laveda Abbe, NP      . dicyclomine (BENTYL) tablet 20 mg  20 mg Oral Q6H PRN Laveda Abbe, NP   20 mg at 01/07/18 0847  . haloperidol (HALDOL) tablet 2 mg  2 mg Oral BID Laveda Abbe, NP   2 mg at 01/07/18 1546  . hydrOXYzine (ATARAX/VISTARIL) tablet 25 mg  25 mg Oral Q6H PRN Antonieta Pert, MD   25 mg at 01/08/18 0641  . loperamide (IMODIUM) capsule 2-4 mg  2-4 mg Oral PRN Laveda Abbe, NP      . magnesium hydroxide (MILK OF MAGNESIA) suspension 30 mL  30 mL Oral Daily PRN Laveda Abbe, NP      . methocarbamol (ROBAXIN) tablet 500 mg  500 mg Oral Q8H PRN Laveda Abbe, NP   500 mg at 01/08/18 0641  . ondansetron (ZOFRAN-ODT) disintegrating tablet 4 mg  4 mg Oral Q6H PRN Laveda Abbe, NP      . traZODone (DESYREL) tablet 50 mg  50 mg Oral QHS PRN Laveda Abbe, NP   50 mg at 01/07/18 2116  . venlafaxine XR (EFFEXOR-XR) 24 hr capsule 75 mg  75 mg Oral Q breakfast Laveda Abbe, NP   75 mg at 01/07/18  9147   PTA Medications: Medications Prior to Admission  Medication Sig Dispense Refill Last Dose  . acetaminophen (TYLENOL 8 HOUR) 650 MG CR tablet Take 1 tablet (650 mg total) by mouth every 8 (eight) hours as needed for pain. (Patient not taking: Reported on 12/16/2017) 21 tablet 0 Unknown at Unknown time  . amoxicillin (AMOXIL) 500 MG capsule Take 1 capsule (500 mg total) by mouth 3 (three) times daily. (Patient not taking: Reported on 12/16/2017) 21 capsule 0 Unknown at Unknown time  . hydrOXYzine (ATARAX/VISTARIL) 25 MG tablet Take 1 tablet (25 mg total) by mouth every 6 (six) hours as needed for anxiety. (Patient not taking: Reported on 12/16/2017) 12 tablet 0 Unknown at Unknown time  . mirtazapine (REMERON) 15 MG tablet Take 1 tablet (15 mg total) by mouth at bedtime. For mood control (Patient not taking: Reported on 12/16/2017) 30 tablet 0 Unknown at Unknown time  . potassium chloride SA (K-DUR,KLOR-CON) 20 MEQ tablet Take 1 tablet (20 mEq total) by mouth daily. (Patient not taking: Reported on 12/16/2017) 7 tablet 0 Unknown at Unknown time  . venlafaxine XR (EFFEXOR-XR) 75 MG 24 hr capsule Take 1 capsule (75 mg total) by mouth daily with breakfast. For mood control (Patient  not taking: Reported on 12/16/2017) 30 capsule 0 Unknown at Unknown time    Patient Stressors: Medication change or noncompliance Substance abuse  Patient Strengths: Ability for insight General fund of knowledge  Treatment Modalities: Medication Management, Group therapy, Case management,  1 to 1 session with clinician, Psychoeducation, Recreational therapy.   Physician Treatment Plan for Primary Diagnosis:  Substance Induced Mood Disorder Long Term Goal(s): Improvement in symptoms so as ready for discharge Improvement in symptoms so as ready for discharge   Short Term Goals: Ability to identify changes in lifestyle to reduce recurrence of condition will improve Ability to verbalize feelings will improve Ability to  disclose and discuss suicidal ideas Ability to demonstrate self-control will improve Ability to identify and develop effective coping behaviors will improve Ability to maintain clinical measurements within normal limits will improve Compliance with prescribed medications will improve Ability to identify triggers associated with substance abuse/mental health issues will improve Ability to identify changes in lifestyle to reduce recurrence of condition will improve Ability to verbalize feelings will improve Ability to disclose and discuss suicidal ideas Ability to demonstrate self-control will improve Ability to identify and develop effective coping behaviors will improve Ability to maintain clinical measurements within normal limits will improve Compliance with prescribed medications will improve Ability to identify triggers associated with substance abuse/mental health issues will improve  Medication Management: Evaluate patient's response, side effects, and tolerance of medication regimen.  Therapeutic Interventions: 1 to 1 sessions, Unit Group sessions and Medication administration.  Evaluation of Outcomes: Adequate for discharge   Physician Treatment Plan for Secondary Diagnosis: Active Problems:   Substance induced mood disorder (HCC)  Long Term Goal(s): Improvement in symptoms so as ready for discharge Improvement in symptoms so as ready for discharge   Short Term Goals: Ability to identify changes in lifestyle to reduce recurrence of condition will improve Ability to verbalize feelings will improve Ability to disclose and discuss suicidal ideas Ability to demonstrate self-control will improve Ability to identify and develop effective coping behaviors will improve Ability to maintain clinical measurements within normal limits will improve Compliance with prescribed medications will improve Ability to identify triggers associated with substance abuse/mental health issues will  improve Ability to identify changes in lifestyle to reduce recurrence of condition will improve Ability to verbalize feelings will improve Ability to disclose and discuss suicidal ideas Ability to demonstrate self-control will improve Ability to identify and develop effective coping behaviors will improve Ability to maintain clinical measurements within normal limits will improve Compliance with prescribed medications will improve Ability to identify triggers associated with substance abuse/mental health issues will improve     Medication Management: Evaluate patient's response, side effects, and tolerance of medication regimen.  Therapeutic Interventions: 1 to 1 sessions, Unit Group sessions and Medication administration.  Evaluation of Outcomes: Adequate for discharge   RN Treatment Plan for Primary Diagnosis:  Substance Induced Mood Disorder Long Term Goal(s): Knowledge of disease and therapeutic regimen to maintain health will improve  Short Term Goals: Ability to remain free from injury will improve, Ability to demonstrate self-control, Ability to participate in decision making will improve and Ability to verbalize feelings will improve  Medication Management: RN will administer medications as ordered by provider, will assess and evaluate patient's response and provide education to patient for prescribed medication. RN will report any adverse and/or side effects to prescribing provider.  Therapeutic Interventions: 1 on 1 counseling sessions, Psychoeducation, Medication administration, Evaluate responses to treatment, Monitor vital signs and CBGs as ordered, Perform/monitor CIWA,  COWS, AIMS and Fall Risk screenings as ordered, Perform wound care treatments as ordered.  Evaluation of Outcomes:  Adequate for discharge    LCSW Treatment Plan for Primary Diagnosis:  Substance Induced Mood Disorder Long Term Goal(s): Safe transition to appropriate next level of care at discharge, Engage  patient in therapeutic group addressing interpersonal concerns.  Short Term Goals: Engage patient in aftercare planning with referrals and resources, Increase emotional regulation, Facilitate patient progression through stages of change regarding substance use diagnoses and concerns and Identify triggers associated with mental health/substance abuse issues  Therapeutic Interventions: Assess for all discharge needs, 1 to 1 time with Social worker, Explore available resources and support systems, Assess for adequacy in community support network, Educate family and significant other(s) on suicide prevention, Complete Psychosocial Assessment, Interpersonal group therapy.  Evaluation of Outcomes: Adequate for discharge  Progress in Treatment: Attending groups: No. Participating in groups: No. Taking medication as prescribed: Yes. Toleration medication: Yes. Family/Significant other contact made: No, will contact:  family member if pt consents to collateral contact.  Patient understands diagnosis: Yes. Discussing patient identified problems/goals with staff: Yes. Medical problems stabilized or resolved: Yes. Denies suicidal/homicidal ideation: No. Passive SI/Able to contract for safety on the unit.  Issues/concerns per patient self-inventory: No. Other: n/a   New problem(s) identified: No, Describe:  n/a  New Short Term/Long Term Goal(s): detox, medication management for mood stabilization; elimination of SI thoughts; development of comprehensive mental wellness/sobriety plan.   Patient Goals:  "to discharge."  Discharge Plan or Barriers: PT DECLINED ALL REFERRALS. Pt was [facility] in [facility] and has been in the ED 10x since then. Pt reports she is homeless and has been sleeping outside. Not current with any outpatient providers. [facility] pamphlet, Mobile Crisis information, and AA/NA information provided to patient for additional community support and resources.   Reason for  Continuation of Hospitalization: none   Estimated Length of Stay: Monday, 01/08/18  Attendees: Patient: 01/08/2018 8:39 AM  Physician: Dr. Jola Babinski MD; Dr. Altamese Arbyrd MD 01/08/2018 8:39 AM  Nursing: Meriam Sprague RN; Olivetter RN 01/08/2018 8:39 AM  RN Care Manager:x 01/08/2018 8:39 AM  Social Worker: Corrie Mckusick LCSW 01/08/2018 8:39 AM  Recreational Therapist: x 01/08/2018 8:39 AM  Other: Reola Calkins NP; Hillery Jacks NP 01/08/2018 8:39 AM  Other:  01/08/2018 8:39 AM  Other: 01/08/2018 8:39 AM    Scribe for Treatment Team: Rona Ravens, LCSW 01/08/2018 8:39 AM

## 2018-01-08 NOTE — [facility] Suicide Risk Assessment (Signed)
[facility] INPATIENT:  Family/Significant Other Suicide Prevention Education  Suicide Prevention Education:  Patient Refusal for Family/Significant Other Suicide Prevention Education: The patient [name] has refused to provide written consent for family/significant other to be provided Family/Significant Other Suicide Prevention Education during admission and/or prior to discharge.  Physician notified.  SPE completed with pt, as pt refused to consent to family contact. SPI pamphlet provided to pt and pt was encouraged to share information with support network, ask questions, and talk about any concerns relating to SPE. Pt denies access to guns/firearms and verbalized understanding of information provided. Mobile Crisis information also provided to pt.   Rona RavensHeather S Thedora Rings LCSW 01/08/2018, 1:02 PM

## 2018-01-21 ENCOUNTER — Emergency Department (HOSPITAL_COMMUNITY)
Admission: EM | Admit: 2018-01-21 | Discharge: 2018-01-21 | Disposition: A | Discharge status: Home / Self Care | Payer: Medicaid Other | Attending: Emergency Medicine | Admitting: Emergency Medicine

## 2018-01-21 ENCOUNTER — Encounter (HOSPITAL_COMMUNITY): Payer: Self-pay | Admitting: Emergency Medicine

## 2018-01-21 DIAGNOSIS — Z91040 Latex allergy status: Secondary | ICD-10-CM | POA: Insufficient documentation

## 2018-01-21 DIAGNOSIS — F1210 Cannabis abuse, uncomplicated: Secondary | ICD-10-CM | POA: Insufficient documentation

## 2018-01-21 DIAGNOSIS — F1410 Cocaine abuse, uncomplicated: Secondary | ICD-10-CM | POA: Insufficient documentation

## 2018-01-21 DIAGNOSIS — F1110 Opioid abuse, uncomplicated: Secondary | ICD-10-CM | POA: Insufficient documentation

## 2018-01-21 DIAGNOSIS — F23 Brief psychotic disorder: Secondary | ICD-10-CM | POA: Insufficient documentation

## 2018-01-21 DIAGNOSIS — R4182 Altered mental status, unspecified: Secondary | ICD-10-CM | POA: Insufficient documentation

## 2018-01-21 DIAGNOSIS — F17210 Nicotine dependence, cigarettes, uncomplicated: Secondary | ICD-10-CM | POA: Insufficient documentation

## 2018-01-21 DIAGNOSIS — Z79899 Other long term (current) drug therapy: Secondary | ICD-10-CM | POA: Insufficient documentation

## 2018-01-21 LAB — CBC WITH DIFFERENTIAL/PLATELET
ABS IMMATURE GRANULOCYTES: 0 10*3/uL (ref 0.0–0.1)
BASOS PCT: 0 %
Basophils Absolute: 0 10*3/uL (ref 0.0–0.1)
EOS ABS: 0.2 10*3/uL (ref 0.0–0.7)
EOS PCT: 2 %
HCT: 35.7 % — ABNORMAL LOW (ref 36.0–46.0)
Hemoglobin: 11.7 g/dL — ABNORMAL LOW (ref 12.0–15.0)
Immature Granulocytes: 0 %
Lymphocytes Relative: 33 %
Lymphs Abs: 2.7 10*3/uL (ref 0.7–4.0)
MCH: 28.3 pg (ref 26.0–34.0)
MCHC: 32.8 g/dL (ref 30.0–36.0)
MCV: 86.2 fL (ref 78.0–100.0)
MONO ABS: 0.8 10*3/uL (ref 0.1–1.0)
MONOS PCT: 9 %
NEUTROS ABS: 4.5 10*3/uL (ref 1.7–7.7)
Neutrophils Relative %: 56 %
Platelets: 412 10*3/uL — ABNORMAL HIGH (ref 150–400)
RBC: 4.14 MIL/uL (ref 3.87–5.11)
RDW: 15.5 % (ref 11.5–15.5)
WBC: 8.1 10*3/uL (ref 4.0–10.5)

## 2018-01-21 LAB — COMPREHENSIVE METABOLIC PANEL
ALT: 22 U/L (ref 0–44)
ANION GAP: 10 (ref 5–15)
AST: 32 U/L (ref 15–41)
Albumin: 3.9 g/dL (ref 3.5–5.0)
Alkaline Phosphatase: 65 U/L (ref 38–126)
BUN: 13 mg/dL (ref 6–20)
CHLORIDE: 106 mmol/L (ref 98–111)
CO2: 23 mmol/L (ref 22–32)
Calcium: 8.8 mg/dL — ABNORMAL LOW (ref 8.9–10.3)
Creatinine, Ser: 0.85 mg/dL (ref 0.44–1.00)
GFR calc Af Amer: >60 mL/min (ref >60)
GFR calc non Af Amer: >60 mL/min (ref >60)
GLUCOSE: 80 mg/dL (ref 70–99)
POTASSIUM: 3.6 mmol/L (ref 3.5–5.1)
Sodium: 139 mmol/L (ref 135–145)
Total Bilirubin: 0.8 mg/dL (ref 0.3–1.2)
Total Protein: 6.8 g/dL (ref 6.5–8.1)

## 2018-01-21 LAB — I-STAT BETA HCG BLOOD, ED (MC, WL, AP ONLY): I-stat hCG, quantitative: <5 m[IU]/mL (ref <5)

## 2018-01-21 LAB — ACETAMINOPHEN LEVEL: Acetaminophen (Tylenol), Serum: <10 ug/mL — ABNORMAL LOW (ref 10–30)

## 2018-01-21 LAB — SALICYLATE LEVEL

## 2018-01-21 LAB — RAPID URINE DRUG SCREEN, HOSP PERFORMED
AMPHETAMINES: NOT DETECTED
BENZODIAZEPINES: POSITIVE — AB
Barbiturates: NOT DETECTED
COCAINE: POSITIVE — AB
Opiates: POSITIVE — AB
TETRAHYDROCANNABINOL: POSITIVE — AB

## 2018-01-21 LAB — ETHANOL: Alcohol, Ethyl (B): <10 mg/dL (ref <10)

## 2018-01-21 MED ORDER — MIDAZOLAM HCL 2 MG/2ML IJ SOLN
4.0000 mg | Freq: Once | INTRAMUSCULAR | Status: AC
  Administered 2018-01-21: 4 mg via INTRAMUSCULAR
  Filled 2018-01-21: qty 4

## 2018-01-21 MED ORDER — STERILE WATER FOR INJECTION IJ SOLN
INTRAMUSCULAR | Status: AC
  Administered 2018-01-21: 11:00:00
  Filled 2018-01-21: qty 10

## 2018-01-21 MED ORDER — ACETAMINOPHEN 325 MG PO TABS: 650.0000 mg | ORAL_TABLET | ORAL | Status: DC | PRN

## 2018-01-21 MED ORDER — MIDAZOLAM HCL 2 MG/2ML IJ SOLN
INTRAMUSCULAR | Status: AC
  Filled 2018-01-21: qty 2

## 2018-01-21 MED ORDER — ONDANSETRON HCL 4 MG PO TABS: 4.0000 mg | ORAL_TABLET | Freq: Three times a day (TID) | ORAL | Status: DC | PRN

## 2018-01-21 MED ORDER — ALUM & MAG HYDROXIDE-SIMETH 200-200-20 MG/5ML PO SUSP: 30.0000 mL | Freq: Four times a day (QID) | ORAL | Status: DC | PRN

## 2018-01-21 MED ORDER — OLANZAPINE 10 MG IM SOLR
10.0000 mg | Freq: Once | INTRAMUSCULAR | Status: AC
  Administered 2018-01-21: 10 mg via INTRAMUSCULAR
  Filled 2018-01-21 (×2): qty 10

## 2018-01-21 NOTE — ED Notes (Signed)
Pt sat up in bed and stated that she needed to use the bathroom. This tech and RN went in to the room to help place pt on bedpan. Placed pt on bedpan. Pt do not use restroom. Placed a brief on pt.

## 2018-01-21 NOTE — ED Notes (Signed)
Sitter at the bedside.

## 2018-01-21 NOTE — Discharge Instructions (Addendum)
Follow up as directed by behavioral health

## 2018-01-21 NOTE — ED Notes (Signed)
IVC papers will not be issued per Magistrate - pt being d/c'd.

## 2018-01-21 NOTE — ED Notes (Signed)
Staffing called, safety sitter not available until 1500.

## 2018-01-21 NOTE — BH Assessment (Signed)
Tele Assessment Note   Patient Name: [name] MRN: [id number] Referring Physician: Adela Lank Location of Patient: [facility] Location of Provider: Behavioral Health TTS Department  [name] is a 32 y.o. female, in ED due to altered mental status. Pt is well known to the system and generally presents with the same complaints. She is a polysubstance abuser and is homeless. She usually comes to the ED with suicidal plans to walk into traffic. Pt appeared distraught at the onset of assessment. She was writhing and moaning on the bed. She refused to participate in assessment until she was given something to eat. RN advised she would get her something to eat while she participated in assessment. After attempting to engage pt, she sat up in the bed and participated, albeit minimally, in assessment. Today, she denies SI to this clinician ("no, not at this second"). Pt denies HI or AVH. Pt is unsure why she came to the ED, although she said that she wanted to come to the hospital. Pt reported "I can't put it into words, I'm just angry". Pt acknowledges daily drug use, but didn't want to elaborate, saying, "I was just here a few weeks ago for the same thing. Everything is in my chart."  Disposition pending psych review.   Diagnosis: Polysubstance abuse  Past Medical History:  Past Medical History:  Diagnosis Date  . Bartholin cyst   . Endometriosis   . Glaucoma   . History of PID   . Hx of migraines   . Ovarian cyst   . STD (female)    hx of chlamydia and gonorrhea  . Substance abuse (HCC)   . Vaginal Pap smear, abnormal    has not followed up    Past Surgical History:  Procedure Laterality Date  . DILATION AND CURETTAGE OF UTERUS    . FRACTURE SURGERY     left leg  . LAPAROSCOPY    . WISDOM TOOTH EXTRACTION      Family History:  Family History  Problem Relation Age of Onset  . Diabetes Mother   . Hypertension Maternal Grandmother   . Heart disease Maternal Grandmother         great grandma  . Anesthesia problems Neg Hx   . Hypotension Neg Hx   . Malignant hyperthermia Neg Hx   . Pseudochol deficiency Neg Hx   . Alcohol abuse Neg Hx     Social History:  reports that she has been smoking cigarettes. She has a 7.00 pack-year smoking history. She has never used smokeless tobacco. She reports that she has current or past drug history. Drugs: Marijuana, Cocaine, and Heroin. She reports that she does not drink alcohol.  Additional Social History:  Alcohol / Drug Use Pain Medications: see MAR Prescriptions: see MAR Over the Counter: see MAR History of alcohol / drug use?: Yes Substance #1 Name of Substance 1: Cocaine  1 - Age of First Use: 20s 1 - Duration: ongoing 1 - Last Use / Amount: labs positive on arrival to ED  Substance #2 Name of Substance 2: Opiates 2 - Age of First Use: 59s 2 - Duration: ongoing 2 - Last Use / Amount: labs positive on arrival  Substance #3 Name of Substance 3: Benzodiazepines 3 - Duration: ongoing 3 - Last Use / Amount: labs positive on arrival to ED   CIWA: CIWA-Ar BP: 127/88 Pulse Rate: 77 COWS:    Allergies:  Allergies  Allergen Reactions  . Acyclovir And Related Swelling and Other (See  Comments)    Reaction:  Facial/tongue swelling  . Acyclovir And Related Swelling  . Darvocet [Propoxyphene N-Acetaminophen] Hives  . Doxycycline Swelling and Other (See Comments)    Reaction:  Facial/tongue swelling  . Doxycycline Swelling  . Flexeril [Cyclobenzaprine] Nausea And Vomiting  . Metoclopramide Hives  . Naproxen Hives  . Oxycodone-Acetaminophen Hives  . Latex Rash  . Tramadol Rash    Home Medications:  (Not in a hospital admission)  OB/GYN Status:  No LMP recorded.  General Assessment Data Location of Assessment: [facility] ED TTS Assessment: In system Is this a Tele or Face-to-Face Assessment?: Tele Assessment Is this an Initial Assessment or a Re-assessment for this encounter?: Initial Assessment Patient  Accompanied by:: N/A Language Other than English: No Living Arrangements: Homeless/Shelter What gender do you identify as?: Female Marital status: Single Pregnancy Status: No Living Arrangements: Other (Comment) Can pt return to current living arrangement?: Yes Admission Status: Voluntary Is patient capable of signing voluntary admission?: Yes Referral Source: Self/Family/Friend     Crisis Care Plan Living Arrangements: Other (Comment) Name of Psychiatrist: N/A  Name of Therapist: N/A   Education Status Is patient currently in school?: No Highest grade of school patient has completed: 9th grade Is the patient employed, unemployed or receiving disability?: Unemployed  Risk to self with the past 6 months Suicidal Ideation: No-Not Currently/Within Last 6 Months Has patient been a risk to self within the past 6 months prior to admission? : No Suicidal Intent: No-Not Currently/Within Last 6 Months Has patient had any suicidal intent within the past 6 months prior to admission? : No Is patient at risk for suicide?: No Suicidal Plan?: No Has patient had any suicidal plan within the past 6 months prior to admission? : Yes Access to Means: No Previous Attempts/Gestures: Yes Triggers for Past Attempts: Other personal contacts, Unpredictable Intentional Self Injurious Behavior: None Family Suicide History: No Recent stressful life event(s): Financial Problems, Other (Comment) Persecutory voices/beliefs?: No Depression: Yes Depression Symptoms: Despondent, Tearfulness, Feeling worthless/self pity, Feeling angry/irritable Substance abuse history and/or treatment for substance abuse?: Yes Suicide prevention information given to non-admitted patients: Not applicable  Risk to Others within the past 6 months Homicidal Ideation: No Does patient have any lifetime risk of violence toward others beyond the six months prior to admission? : No Thoughts of Harm to Others: No Current Homicidal  Intent: No Current Homicidal Plan: No Access to Homicidal Means: No History of harm to others?: No Assessment of Violence: None Noted Does patient have access to weapons?: No Criminal Charges Pending?: No Does patient have a court date: No Is patient on probation?: No  Psychosis Hallucinations: None noted Delusions: None noted  Mental Status Report Appearance/Hygiene: Disheveled Eye Contact: Fair Motor Activity: Unremarkable Speech: Logical/coherent Mood: Depressed, Anxious, Labile, Irritable Affect: Appropriate to circumstance Anxiety Level: Moderate Thought Processes: Coherent Judgement: Impaired Orientation: Person, Place, Time, Situation, Appropriate for developmental age Obsessive Compulsive Thoughts/Behaviors: None  Cognitive Functioning Concentration: Decreased Memory: Unable to Assess Is patient IDD: No Insight: Fair Impulse Control: Fair Appetite: Good Vegetative Symptoms: None  ADLScreening [facility] Assessment Services) Patient's cognitive ability adequate to safely complete daily activities?: Yes Patient able to express need for assistance with ADLs?: Yes Independently performs ADLs?: Yes (appropriate for developmental age)  Prior Inpatient Therapy Prior Inpatient Therapy: Yes Prior Therapy Dates: 2019 Prior Therapy Facilty/Provider(s): [facility]  Reason for Treatment: MDD, SUBSTANCE ABUSE   Prior Outpatient Therapy Prior Outpatient Therapy: Yes Prior Therapy Dates: UNKNOWN Prior Therapy Facilty/Provider(s): [facility] Reason for  Treatment: MED MANAGEMENT Does patient have an ACCT team?: No Does patient have Intensive In-House Services?  : No Does patient have [facility] services? : No Does patient have P4[facility] services?: No  ADL Screening (condition at time of admission) Patient's cognitive ability adequate to safely complete daily activities?: Yes Is the patient deaf or have difficulty hearing?: No Does the patient have difficulty seeing, even when wearing  glasses/contacts?: No Does the patient have difficulty concentrating, remembering, or making decisions?: No Patient able to express need for assistance with ADLs?: Yes Does the patient have difficulty dressing or bathing?: No Independently performs ADLs?: Yes (appropriate for developmental age) Does the patient have difficulty walking or climbing stairs?: No Weakness of Legs: None Weakness of Arms/Hands: None  Home Assistive Devices/Equipment Home Assistive Devices/Equipment: None    Abuse/Neglect Assessment (Assessment to be complete while patient is alone) Physical Abuse: Denies Verbal Abuse: Denies Sexual Abuse: Denies Exploitation of patient/patient's resources: Denies Self-Neglect: Denies Values / Beliefs Cultural Requests During Hospitalization: None Spiritual Requests During Hospitalization: None   Advance Directives (For Healthcare) Does Patient Have a Medical Advance Directive?: No Would patient like information on creating a medical advance directive?: No - Patient declined          Disposition:  Disposition Initial Assessment Completed for this Encounter: Yes  This service was provided via telemedicine using a 2-way, interactive audio and video technology.  Names of all persons participating in this telemedicine service and their role in this encounter.   Laddie Aquas 01/21/2018 4:00 PM

## 2018-01-21 NOTE — ED Notes (Signed)
Pt was able to stand and use bedside toilet. Pt stated, "I am angry and want to be left alone. I am hungry and want something to eat." Pt unhooked herself from monitor and would not let this tech place her back on the monitor, RN notified. Pt screaming out and crying. Sitter at beside and back in bed.

## 2018-01-21 NOTE — ED Notes (Signed)
Pt given [place] sandwich, multiple packs of crackers, and water.

## 2018-01-21 NOTE — Progress Notes (Signed)
Patient seen by me via tele-psych and I have consulted with Dr. Lucianne Muss.  Patient denies any suicidal or homicidal ideations.  Patient also denies any hallucinations.  Patient states that she was using drugs late into the morning and that she came to the hospital.  Patient's UDS was positive for benzos and opiates cocaine and marijuana.  Patient was discharged from a [facility] on 01/08/2018.  Patient stated that she was not compliant with her medications the day after she left.  Patient stated she started using drugs again immediately after she left.  However, patient does not meet inpatient criteria and is psychiatrically cleared.  Patient is referred to follow up with her outpatient providers and seek outpatient services for drug abuse.  I contacted Dr. Jodi Mourning and notified him of our recommendations.

## 2018-01-21 NOTE — ED Notes (Signed)
Patient verbalizes understanding of discharge instructions. Opportunity for questioning and answers were provided. Armband removed by staff, pt discharged from ED to home via public transportation.

## 2018-01-21 NOTE — ED Notes (Signed)
Obtained intial vital signs upon arrival. Pt would not keep monitor on. Will repeat vitals every hour.

## 2018-01-21 NOTE — ED Notes (Signed)
Pt now placed on monitor since pt was given medicine to help calm her down. Pt now resting.
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
## 2018-01-21 NOTE — ED Notes (Signed)
Pt refused vital signs.
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
## 2018-01-22 ENCOUNTER — Other Ambulatory Visit: Payer: Self-pay

## 2018-01-22 ENCOUNTER — Encounter (HOSPITAL_COMMUNITY): Payer: Self-pay | Admitting: Emergency Medicine

## 2018-01-22 ENCOUNTER — Emergency Department (HOSPITAL_COMMUNITY)
Admission: EM | Admit: 2018-01-22 | Discharge: 2018-01-23 | Disposition: A | Discharge status: Home / Self Care | Payer: Medicaid Other | Attending: Emergency Medicine | Admitting: Emergency Medicine

## 2018-01-22 DIAGNOSIS — R45851 Suicidal ideations: Secondary | ICD-10-CM | POA: Insufficient documentation

## 2018-01-22 DIAGNOSIS — F332 Major depressive disorder, recurrent severe without psychotic features: Secondary | ICD-10-CM | POA: Insufficient documentation

## 2018-01-22 DIAGNOSIS — F17210 Nicotine dependence, cigarettes, uncomplicated: Secondary | ICD-10-CM | POA: Insufficient documentation

## 2018-01-22 DIAGNOSIS — Z79899 Other long term (current) drug therapy: Secondary | ICD-10-CM | POA: Insufficient documentation

## 2018-01-22 DIAGNOSIS — F1110 Opioid abuse, uncomplicated: Secondary | ICD-10-CM | POA: Insufficient documentation

## 2018-01-22 LAB — COMPREHENSIVE METABOLIC PANEL
ALK PHOS: 63 U/L (ref 38–126)
ALT: 21 U/L (ref 0–44)
AST: 27 U/L (ref 15–41)
Albumin: 3.5 g/dL (ref 3.5–5.0)
Anion gap: 6 (ref 5–15)
BUN: 12 mg/dL (ref 6–20)
CHLORIDE: 107 mmol/L (ref 98–111)
CO2: 25 mmol/L (ref 22–32)
CREATININE: 0.87 mg/dL (ref 0.44–1.00)
Calcium: 9 mg/dL (ref 8.9–10.3)
GFR calc non Af Amer: >60 mL/min (ref >60)
GLUCOSE: 141 mg/dL — AB (ref 70–99)
Potassium: 4.1 mmol/L (ref 3.5–5.1)
SODIUM: 138 mmol/L (ref 135–145)
Total Bilirubin: 0.4 mg/dL (ref 0.3–1.2)
Total Protein: 6.8 g/dL (ref 6.5–8.1)

## 2018-01-22 LAB — CBC
HEMATOCRIT: 38.6 % (ref 36.0–46.0)
Hemoglobin: 12.6 g/dL (ref 12.0–15.0)
MCH: 28.2 pg (ref 26.0–34.0)
MCHC: 32.6 g/dL (ref 30.0–36.0)
MCV: 86.4 fL (ref 78.0–100.0)
Platelets: 431 10*3/uL — ABNORMAL HIGH (ref 150–400)
RBC: 4.47 MIL/uL (ref 3.87–5.11)
RDW: 15.8 % — ABNORMAL HIGH (ref 11.5–15.5)
WBC: 6.9 10*3/uL (ref 4.0–10.5)

## 2018-01-22 LAB — RAPID URINE DRUG SCREEN, HOSP PERFORMED
Amphetamines: NOT DETECTED
BARBITURATES: NOT DETECTED
Benzodiazepines: POSITIVE — AB
Cocaine: POSITIVE — AB
Opiates: POSITIVE — AB
Tetrahydrocannabinol: POSITIVE — AB

## 2018-01-22 LAB — I-STAT BETA HCG BLOOD, ED (MC, WL, AP ONLY)
I-stat hCG, quantitative: <5 m[IU]/mL (ref <5)
I-stat hCG, quantitative: <5 m[IU]/mL (ref <5)

## 2018-01-22 LAB — ETHANOL: Alcohol, Ethyl (B): <10 mg/dL (ref <10)

## 2018-01-22 MED ORDER — HALOPERIDOL 1 MG PO TABS
2.0000 mg | ORAL_TABLET | Freq: Once | ORAL | Status: AC
  Administered 2018-01-22: 2 mg via ORAL
  Filled 2018-01-22: qty 2

## 2018-01-22 MED ORDER — ACETAMINOPHEN 500 MG PO TABS
1000.0000 mg | ORAL_TABLET | Freq: Once | ORAL | Status: AC
  Administered 2018-01-22: 1000 mg via ORAL
  Filled 2018-01-22: qty 2

## 2018-01-22 MED ORDER — VENLAFAXINE HCL ER 75 MG PO CP24
75.0000 mg | ORAL_CAPSULE | Freq: Once | ORAL | Status: AC
  Administered 2018-01-22: 75 mg via ORAL
  Filled 2018-01-22: qty 1

## 2018-01-22 MED ORDER — ACETAMINOPHEN 325 MG PO TABS
650.0000 mg | ORAL_TABLET | Freq: Once | ORAL | Status: AC
  Administered 2018-01-22: 650 mg via ORAL
  Filled 2018-01-22: qty 2

## 2018-01-22 MED ORDER — LORAZEPAM 1 MG PO TABS
1.0000 mg | ORAL_TABLET | Freq: Once | ORAL | Status: AC
  Administered 2018-01-22: 1 mg via ORAL
  Filled 2018-01-22: qty 1

## 2018-01-22 MED ORDER — HYDROXYZINE HCL 25 MG PO TABS
25.0000 mg | ORAL_TABLET | Freq: Once | ORAL | Status: AC
  Administered 2018-01-22: 25 mg via ORAL
  Filled 2018-01-22: qty 1

## 2018-01-22 NOTE — ED Notes (Signed)
Prt resting quietly with sitter at door.

## 2018-01-22 NOTE — ED Notes (Signed)
tts done pt c/o of being  Having h/a . Dr aware and diet tray ordered

## 2018-01-22 NOTE — ED Notes (Addendum)
Pt ambulates to bathroom with steady gait

## 2018-01-22 NOTE — ED Notes (Signed)
Pt continues to c/o dental pain althogh had slept and was able to eat 100% of diet.
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
## 2018-01-22 NOTE — ED Notes (Signed)
Per tts PT MEETS INPT REQUIREMEMNTS

## 2018-01-22 NOTE — ED Notes (Signed)
Pharm tech called to confirm home medications-[name],RN

## 2018-01-22 NOTE — ED Triage Notes (Signed)
Pt to ER for persistent SI. Also reports mouth pain

## 2018-01-22 NOTE — ED Notes (Signed)
Pt states that if we are getting blood why do we need urine. I informed pt that the blood and urine are 2 different tests. Pt stated "well I don;'t need to go so ya'll can wait". RN informed

## 2018-01-22 NOTE — ED Notes (Signed)
ED Provider at bedside. 

## 2018-01-22 NOTE — ED Notes (Signed)
REPORT TO cRYSTAL RN

## 2018-01-22 NOTE — BH Assessment (Signed)
[facility] Assessment Progress Note  Patient referred to the the following facilities for consideration:  [facility],  [facility],  [place],  [facility],  [place],  [address] [place]  [facility]

## 2018-01-22 NOTE — BH Assessment (Signed)
Tele Assessment Note   Patient Name: [name] MRN: [id number] Referring Physician: Denton [name] Location of Patient: [facility] Location of Provider: Behavioral Health TTS Department  [name] is a 32 y.o. female, in ED due to depression and associated SI. Pt was assessed in the ED less than 24 hours ago for AMS associated with her drug use. Pt denied SI, HI, AVH and was d/c. Pt reports that she stayed in the waiting room and got sad. Pt is very tearful throughout assessment. Pt reports "I'm scared I'm gonna hurt myself" and "I'm really depressed and don't want to be alone". Pt outlines plans of suicide, such as walking into traffic and cutting her wrists. Pt does not feel safe to be d/c b/c she says "I can't even think to get my thoughts straight to go somewhere". No HI, AVH.   Case staffed with Fransisca Kaufmann, PMHNP, and pt is recommended for IP treatment. Pt's RN, Arline Asp, notified of disposition.   Diagnosis: F33.2  MDD; Polysubstance abuse (opioid, cocaine, benzos, THC)  Past Medical History:  Past Medical History:  Diagnosis Date  . Bartholin cyst   . Endometriosis   . Glaucoma   . History of PID   . Hx of migraines   . Ovarian cyst   . STD (female)    hx of chlamydia and gonorrhea  . Substance abuse (HCC)   . Vaginal Pap smear, abnormal    has not followed up    Past Surgical History:  Procedure Laterality Date  . DILATION AND CURETTAGE OF UTERUS    . FRACTURE SURGERY     left leg  . LAPAROSCOPY    . WISDOM TOOTH EXTRACTION      Family History:  Family History  Problem Relation Age of Onset  . Diabetes Mother   . Hypertension Maternal Grandmother   . Heart disease Maternal Grandmother        great grandma  . Anesthesia problems Neg Hx   . Hypotension Neg Hx   . Malignant hyperthermia Neg Hx   . Pseudochol deficiency Neg Hx   . Alcohol abuse Neg Hx     Social History:  reports that she has been smoking cigarettes. She has a 7.00 pack-year smoking  history. She has never used smokeless tobacco. She reports that she has current or past drug history. Drugs: Marijuana, Cocaine, and Heroin. She reports that she does not drink alcohol.  Additional Social History:  Alcohol / Drug Use Pain Medications: see MAR Prescriptions: see MAR Over the Counter: see MAR History of alcohol / drug use?: Yes Substance #1 Name of Substance 1: Cocaine  1 - Age of First Use: 20s 1 - Duration: ongoing 1 - Last Use / Amount: labs positive on arrival to ED  Substance #2 Name of Substance 2: Opiates 2 - Age of First Use: 32s 2 - Duration: ongoing 2 - Last Use / Amount: labs positive on arrival  Substance #3 Name of Substance 3: Benzodiazepines 3 - Duration: ongoing 3 - Last Use / Amount: labs positive on arrival to ED   CIWA: CIWA-Ar BP: (!) 131/98 Pulse Rate: 79 COWS:    Allergies:  Allergies  Allergen Reactions  . Acyclovir And Related Swelling and Other (See Comments)    Reaction:  Facial/tongue swelling  . Acyclovir And Related Swelling  . Darvocet [Propoxyphene N-Acetaminophen] Hives  . Doxycycline Swelling and Other (See Comments)    Reaction:  Facial/tongue swelling  . Doxycycline Swelling  . Flexeril [Cyclobenzaprine] Nausea  And Vomiting  . Metoclopramide Hives  . Naproxen Hives  . Oxycodone-Acetaminophen Hives  . Latex Rash  . Tramadol Rash    Home Medications:  (Not in a hospital admission)  OB/GYN Status:  No LMP recorded.  General Assessment Data Location of Assessment: [facility] ED TTS Assessment: In system Is this a Tele or Face-to-Face Assessment?: Tele Assessment Is this an Initial Assessment or a Re-assessment for this encounter?: Initial Assessment Patient Accompanied by:: N/A Language Other than English: No Living Arrangements: Homeless/Shelter What gender do you identify as?: Female Marital status: Single Pregnancy Status: No Living Arrangements: Other (Comment) Can pt return to current living arrangement?:  Yes Admission Status: Voluntary Is patient capable of signing voluntary admission?: Yes Referral Source: Self/Family/Friend Insurance type: Medicaid     Crisis Care Plan Living Arrangements: Other (Comment) Name of Psychiatrist: N/A  Name of Therapist: N/A   Education Status Is patient currently in school?: No Highest grade of school patient has completed: 9th grade Is the patient employed, unemployed or receiving disability?: Unemployed  Risk to self with the past 6 months Suicidal Ideation: Yes-Currently Present Has patient been a risk to self within the past 6 months prior to admission? : No Suicidal Intent: No-Not Currently/Within Last 6 Months Has patient had any suicidal intent within the past 6 months prior to admission? : No Is patient at risk for suicide?: Yes Suicidal Plan?: Yes-Currently Present Has patient had any suicidal plan within the past 6 months prior to admission? : No Specify Current Suicidal Plan: walk into traffic; cut wrists Access to Means: Yes What has been your use of drugs/alcohol within the last 12 months?: daily polysubstance use Previous Attempts/Gestures: Yes How many times?: 1 Triggers for Past Attempts: Other personal contacts, Unpredictable Intentional Self Injurious Behavior: None Family Suicide History: No Recent stressful life event(s): Financial Problems Persecutory voices/beliefs?: No Depression: Yes Depression Symptoms: Despondent, Tearfulness, Loss of interest in usual pleasures, Feeling worthless/self pity, Feeling angry/irritable Substance abuse history and/or treatment for substance abuse?: Yes Suicide prevention information given to non-admitted patients: Not applicable  Risk to Others within the past 6 months Homicidal Ideation: No Does patient have any lifetime risk of violence toward others beyond the six months prior to admission? : No Thoughts of Harm to Others: No Current Homicidal Intent: No Current Homicidal Plan:  No Access to Homicidal Means: No History of harm to others?: No Assessment of Violence: None Noted Does patient have access to weapons?: No Criminal Charges Pending?: No Does patient have a court date: No Is patient on probation?: No  Psychosis Hallucinations: None noted Delusions: None noted  Mental Status Report Appearance/Hygiene: Disheveled Eye Contact: Poor Motor Activity: Unremarkable Speech: Unremarkable Level of Consciousness: Crying, Irritable Mood: Depressed, Irritable Affect: Appropriate to circumstance Anxiety Level: Moderate Thought Processes: Coherent, Relevant Judgement: Partial Orientation: Person, Place, Time, Situation, Appropriate for developmental age Obsessive Compulsive Thoughts/Behaviors: None  Cognitive Functioning Concentration: Fair Memory: Unable to Assess Is patient IDD: No Insight: Fair Impulse Control: Poor Appetite: Good Have you had any weight changes? : No Change Sleep: No Change Total Hours of Sleep: 7 Vegetative Symptoms: None  ADLScreening [facility] Assessment Services) Patient's cognitive ability adequate to safely complete daily activities?: Yes Patient able to express need for assistance with ADLs?: Yes Independently performs ADLs?: Yes (appropriate for developmental age)  Prior Inpatient Therapy Prior Inpatient Therapy: Yes Prior Therapy Dates: 2019 Prior Therapy Facilty/Provider(s): [facility]  Reason for Treatment: MDD, SUBSTANCE ABUSE   Prior Outpatient Therapy Prior Outpatient Therapy: Yes  Prior Therapy Dates: UNKNOWN Prior Therapy Facilty/Provider(s): [facility] Reason for Treatment: MED MANAGEMENT Does patient have an ACCT team?: No Does patient have Intensive In-House Services?  : No Does patient have [facility] services? : No Does patient have P4CC services?: No  ADL Screening (condition at time of admission) Patient's cognitive ability adequate to safely complete daily activities?: Yes Is the patient deaf or have difficulty  hearing?: No Does the patient have difficulty seeing, even when wearing glasses/contacts?: No Does the patient have difficulty concentrating, remembering, or making decisions?: No Patient able to express need for assistance with ADLs?: Yes Does the patient have difficulty dressing or bathing?: No Independently performs ADLs?: Yes (appropriate for developmental age) Does the patient have difficulty walking or climbing stairs?: No Weakness of Legs: None Weakness of Arms/Hands: None  Home Assistive Devices/Equipment Home Assistive Devices/Equipment: None    Abuse/Neglect Assessment (Assessment to be complete while patient is alone) Physical Abuse: Denies Verbal Abuse: Denies Sexual Abuse: Denies Exploitation of patient/patient's resources: Denies Self-Neglect: Denies Values / Beliefs Cultural Requests During Hospitalization: None Spiritual Requests During Hospitalization: None   Advance Directives (For Healthcare) Does Patient Have a Medical Advance Directive?: No Would patient like information on creating a medical advance directive?: No - Patient declined          Disposition:  Disposition Initial Assessment Completed for this Encounter: Yes  This service was provided via telemedicine using a 2-way, interactive audio and video technology.  Names of all persons participating in this telemedicine service and their role in this encounter.   Laddie Aquas 01/22/2018 10:37 AM

## 2018-01-23 ENCOUNTER — Other Ambulatory Visit: Payer: Self-pay

## 2018-01-23 MED ORDER — ACETAMINOPHEN 325 MG PO TABS
650.0000 mg | ORAL_TABLET | ORAL | Status: DC | PRN
  Administered 2018-01-23: 650 mg via ORAL
  Filled 2018-01-23: qty 2

## 2018-01-23 NOTE — BH Assessment (Signed)
[facility] Assessment Progress Note    Patient was seen by Dr. Lucianne Muss via tele-psych.  Patient has been psychiatrically cleared for discharge.  Patient denies current SI/HI/Psychosis.  Patient can contract for safety. Patient  Is seeking assistance with her drug problem as well as having a safe and stable place to live.  Patient can be discharged with a referral to the [facility] for continued services.

## 2018-01-23 NOTE — ED Notes (Signed)
Telepsych being performed. 

## 2018-01-23 NOTE — ED Notes (Signed)
Pt ambulated to desk from bathroom - asking if she may have med for anxiety - specified "Ativan". RN attempting to reach [facility] for med rec.

## 2018-01-23 NOTE — ED Notes (Signed)
Pt. Provided a sandwich bag and sprite for snack.

## 2018-01-23 NOTE — Progress Notes (Addendum)
12:03pm-CSW spoke with [facility] to inform them of pt expressing that pt doesn't have a ID at this time. CSW reached out to [facility] to see if they will still take pt with no ID. CSW unable to speak with someone at this time. CSW try [facility] once more.   CSW consulted by RN as pt is suppose to go to [facility] today. CSW spoke with pt concerning ID and pt reports that pt does not have a ID. CSW reached back out to dispositions to see if pt can go to the facility without the ID. CSW to follow up with Lillia Abed at [facility] regarding this matter.    [name], MSW, LCSW-A Emergency Department Clinical Social Worker [phone]

## 2018-01-23 NOTE — ED Notes (Addendum)
Pt requesting Tylenol and for c/o headache. Dr Jeraldine Loots aware - order received for Tylenol 650mg  po every 4 hours prn.

## 2018-01-23 NOTE — ED Notes (Signed)
Patient has been sleeping all night and would slightly respond when RN attempted to assses her earlier in the shift; pt stumbles out of room stating she needs something for a HA and anxiety because she is unable to go back to sleep; EDP notified of patient's quest-[name],RN

## 2018-01-23 NOTE — ED Notes (Signed)
Pt changed into personal clothing - Security escorting pt. Bus pass given.

## 2018-01-23 NOTE — ED Notes (Signed)
Pt noted to be lying on bed quietly. Does not appear to be responding to any internal stimuli. Pt ambulated to door - asking "Where is my anxiety medicine?" Advised pt she had Telepsych performed and they are aware. Pt returned to lying on bed.

## 2018-01-23 NOTE — ED Notes (Addendum)
Requested for Sharyl Nimrod [facility] Counselor, to request Med Rec from NP, [facility]. Pt aware of delay.

## 2018-01-23 NOTE — ED Notes (Signed)
ALL belongings - 2 labeled belongings bags and roller walker - NO Valuables noted - returned to pt - Pt signed verifying all items present. Pt voiced understanding and agreement w/tx plan. Pt given info re: IRC - voiced understanding to follow up w/getting her ID and then contacting [facility].

## 2018-01-23 NOTE — ED Provider Notes (Signed)
Patient cleared for discharge by psychiatry.  Follow-up in place.  Discharged in good condition.   Virgina Norfolk, DO 01/23/18 1253

## 2018-01-23 NOTE — ED Notes (Signed)
Pt. Out at desk, breathing heavy, requesting anxiety medicine.

## 2018-04-28 IMAGING — CR DG CHEST 2V
2 series · 2 of 2 positions shown · non-contrast
Comparison: 01/15/2011 and 10/04/2009 CT

CLINICAL DATA: Productive cough for 3 days. Sore throat. Fever.
Early pregnancy.

EXAM:
CHEST  2 VIEW

[chest pa]
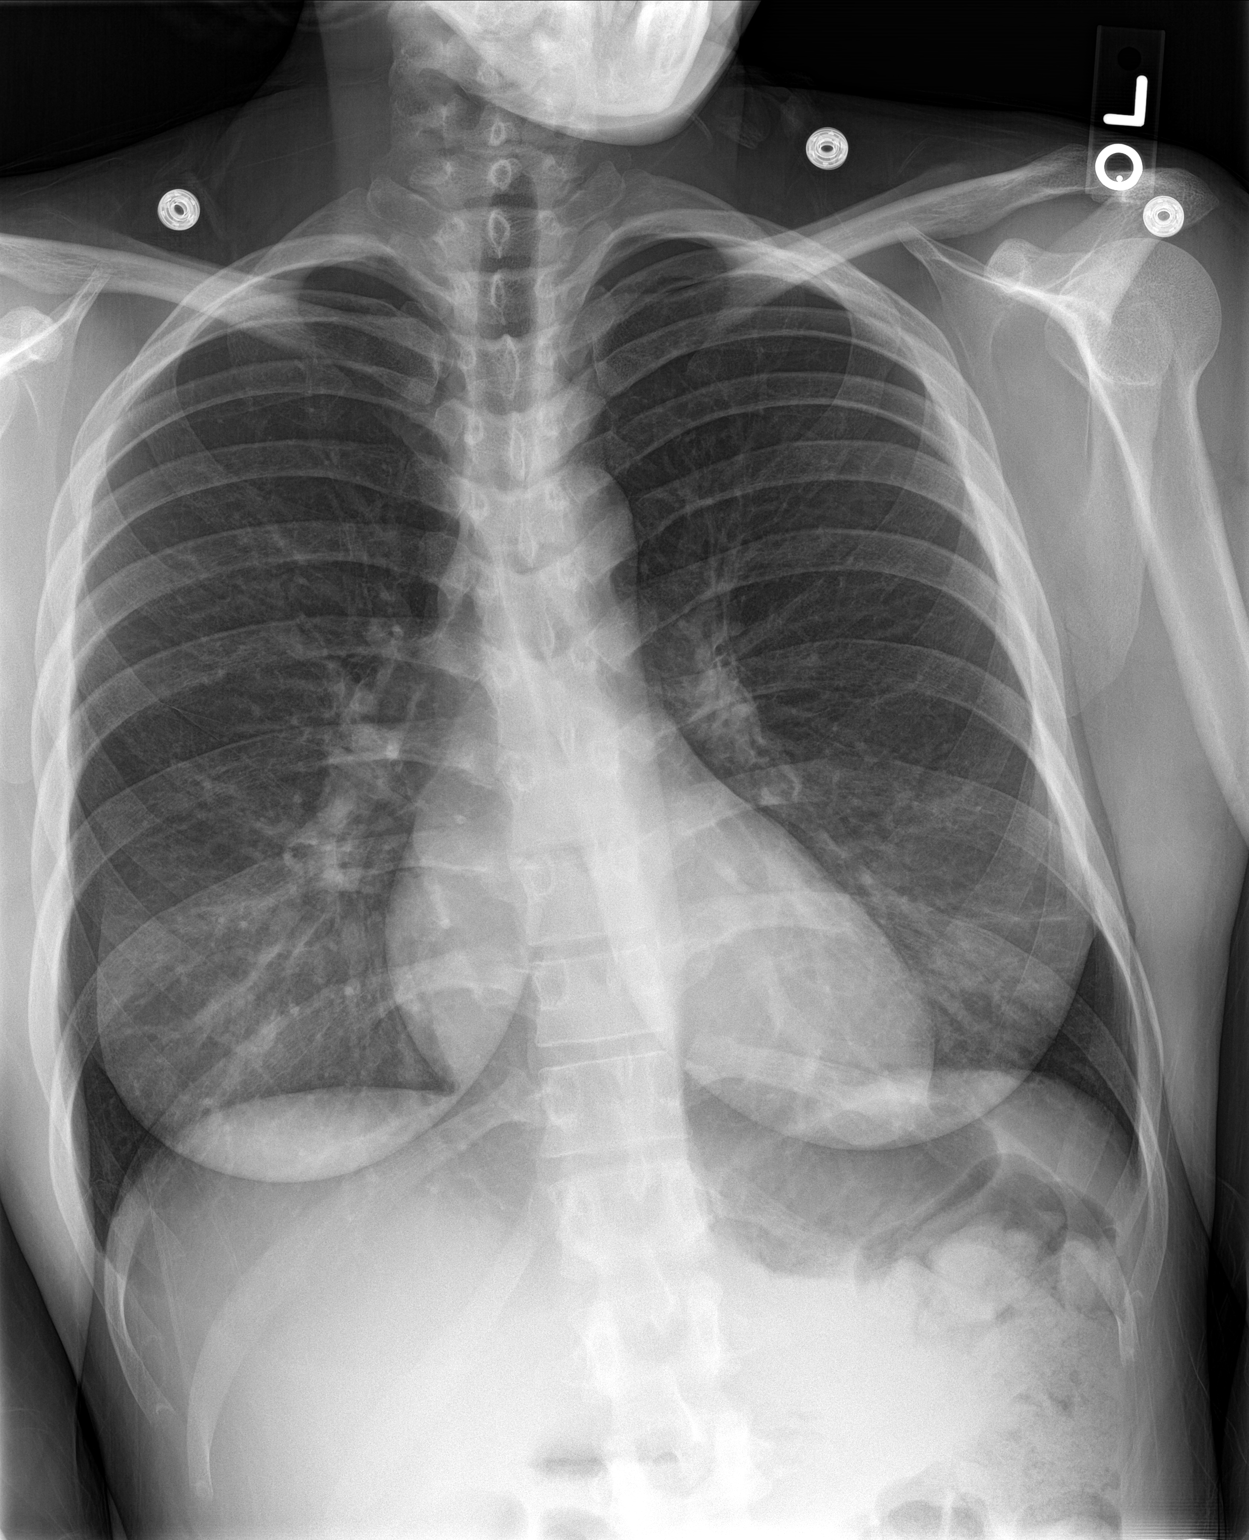

[chest lat]
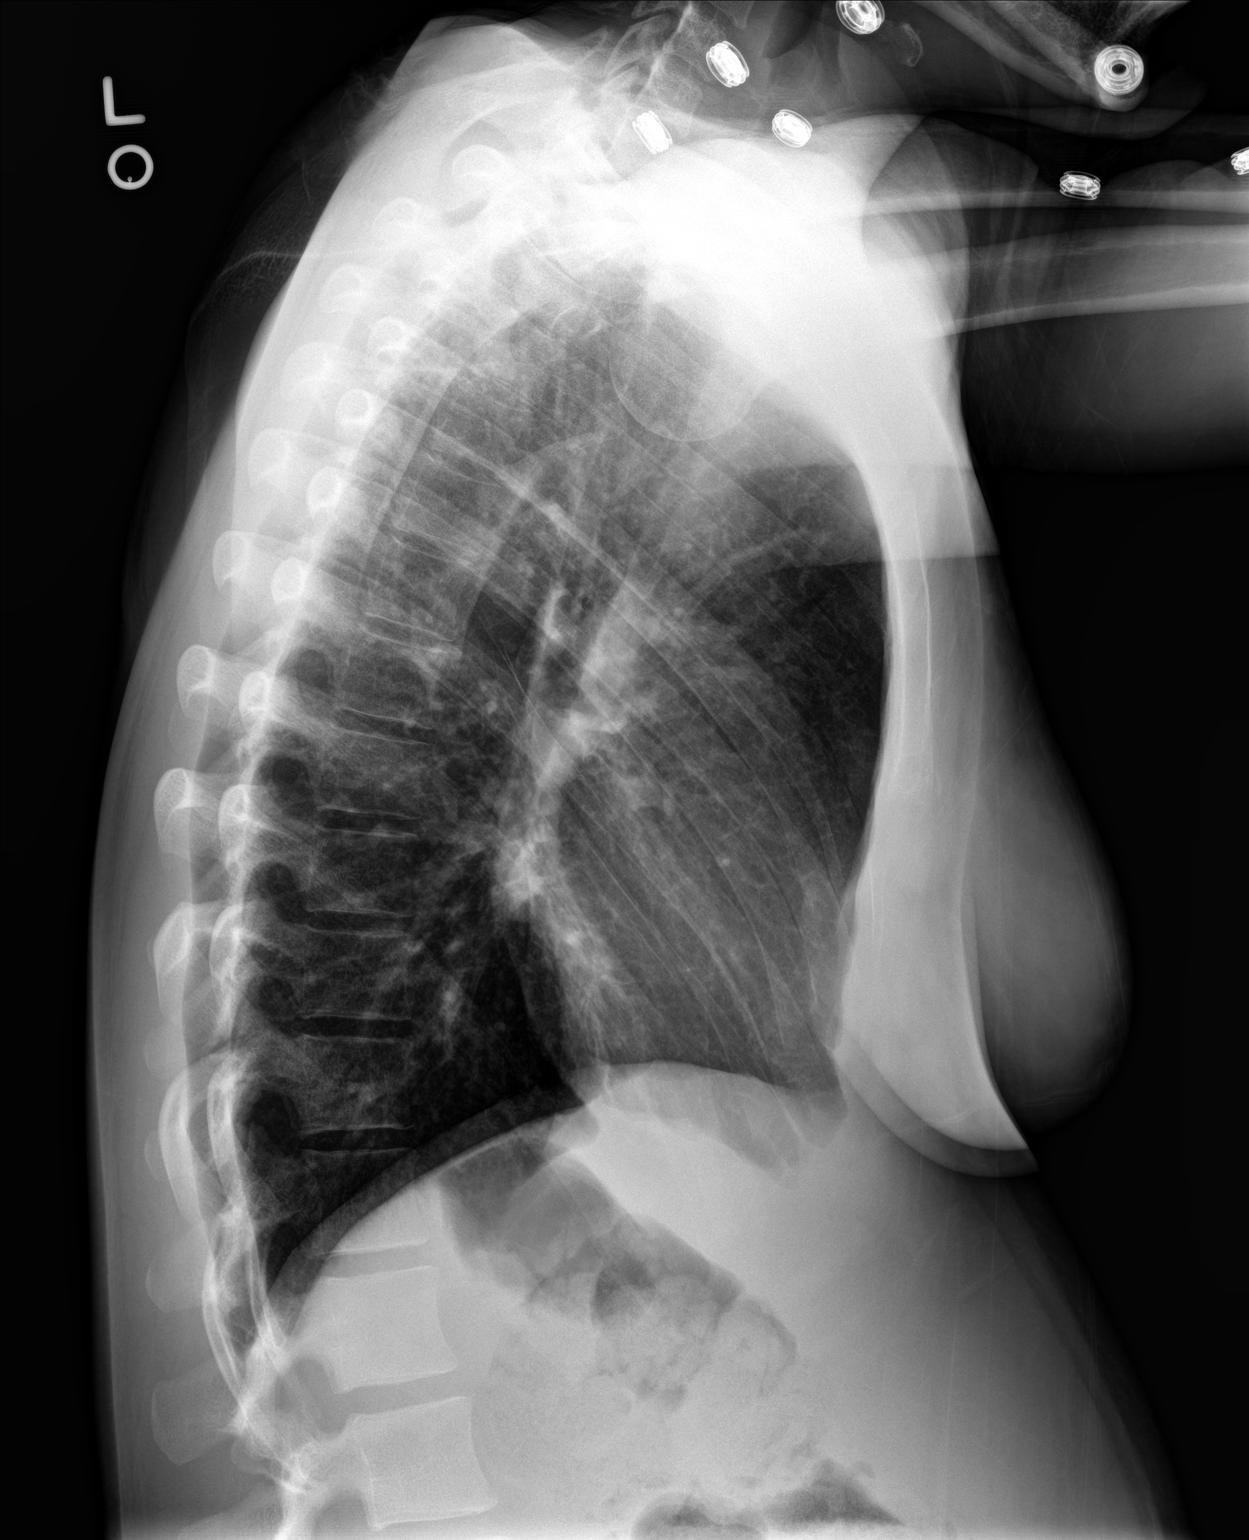

[2 of 2 positions shown; findings below may reference images not displayed]

FINDINGS: The patient's abdomen was shielded.

The lungs appear clear. Cardiac and mediastinal contours normal. No
pleural effusion identified.
IMPRESSION: 1.  No significant abnormality identified.

## 2018-05-15 ENCOUNTER — Emergency Department (HOSPITAL_COMMUNITY)
Admission: EM | Admit: 2018-05-15 | Discharge: 2018-05-16 | Disposition: A | Discharge status: Home / Self Care | Payer: Medicaid Other | Attending: Emergency Medicine | Admitting: Emergency Medicine

## 2018-05-15 ENCOUNTER — Encounter (HOSPITAL_COMMUNITY): Payer: Self-pay

## 2018-05-15 DIAGNOSIS — F17210 Nicotine dependence, cigarettes, uncomplicated: Secondary | ICD-10-CM | POA: Insufficient documentation

## 2018-05-15 DIAGNOSIS — Z91040 Latex allergy status: Secondary | ICD-10-CM | POA: Insufficient documentation

## 2018-05-15 DIAGNOSIS — F1994 Other psychoactive substance use, unspecified with psychoactive substance-induced mood disorder: Secondary | ICD-10-CM | POA: Diagnosis present

## 2018-05-15 DIAGNOSIS — D573 Sickle-cell trait: Secondary | ICD-10-CM | POA: Insufficient documentation

## 2018-05-15 DIAGNOSIS — F1414 Cocaine abuse with cocaine-induced mood disorder: Secondary | ICD-10-CM | POA: Insufficient documentation

## 2018-05-15 DIAGNOSIS — R45851 Suicidal ideations: Secondary | ICD-10-CM | POA: Insufficient documentation

## 2018-05-15 DIAGNOSIS — F1120 Opioid dependence, uncomplicated: Secondary | ICD-10-CM | POA: Insufficient documentation

## 2018-05-15 LAB — COMPREHENSIVE METABOLIC PANEL
ALK PHOS: 55 U/L (ref 38–126)
ALT: 17 U/L (ref 0–44)
AST: 25 U/L (ref 15–41)
Albumin: 4.2 g/dL (ref 3.5–5.0)
Anion gap: 9 (ref 5–15)
BUN: 14 mg/dL (ref 6–20)
CALCIUM: 8.6 mg/dL — AB (ref 8.9–10.3)
CO2: 24 mmol/L (ref 22–32)
CREATININE: 1.07 mg/dL — AB (ref 0.44–1.00)
Chloride: 107 mmol/L (ref 98–111)
GFR calc Af Amer: >60 mL/min (ref >60)
Glucose, Bld: 90 mg/dL (ref 70–99)
Potassium: 3.2 mmol/L — ABNORMAL LOW (ref 3.5–5.1)
Sodium: 140 mmol/L (ref 135–145)
Total Bilirubin: 0.3 mg/dL (ref 0.3–1.2)
Total Protein: 7.4 g/dL (ref 6.5–8.1)

## 2018-05-15 LAB — CBC WITH DIFFERENTIAL/PLATELET
ABS IMMATURE GRANULOCYTES: 0.08 10*3/uL — AB (ref 0.00–0.07)
Basophils Absolute: 0 10*3/uL (ref 0.0–0.1)
Basophils Relative: 0 %
Eosinophils Absolute: 0 10*3/uL (ref 0.0–0.5)
Eosinophils Relative: 0 %
HEMATOCRIT: 36.8 % (ref 36.0–46.0)
HEMOGLOBIN: 11.4 g/dL — AB (ref 12.0–15.0)
Immature Granulocytes: 1 %
LYMPHS ABS: 3.5 10*3/uL (ref 0.7–4.0)
LYMPHS PCT: 22 %
MCH: 28.9 pg (ref 26.0–34.0)
MCHC: 31 g/dL (ref 30.0–36.0)
MCV: 93.2 fL (ref 80.0–100.0)
MONO ABS: 1.6 10*3/uL — AB (ref 0.1–1.0)
MONOS PCT: 10 %
Neutro Abs: 11 10*3/uL — ABNORMAL HIGH (ref 1.7–7.7)
Neutrophils Relative %: 67 %
Platelets: 399 10*3/uL (ref 150–400)
RBC: 3.95 MIL/uL (ref 3.87–5.11)
RDW: 15.9 % — ABNORMAL HIGH (ref 11.5–15.5)
WBC: 16.2 10*3/uL — ABNORMAL HIGH (ref 4.0–10.5)
nRBC: 0 % (ref 0.0–0.2)

## 2018-05-15 LAB — HCG, QUANTITATIVE, PREGNANCY: hCG, Beta Chain, Quant, S: <1 m[IU]/mL (ref <5)

## 2018-05-15 LAB — ACETAMINOPHEN LEVEL: Acetaminophen (Tylenol), Serum: <10 ug/mL — ABNORMAL LOW (ref 10–30)

## 2018-05-15 LAB — ETHANOL: Alcohol, Ethyl (B): <10 mg/dL (ref <10)

## 2018-05-15 LAB — SALICYLATE LEVEL: Salicylate Lvl: <7 mg/dL (ref 2.8–30.0)

## 2018-05-15 MED ORDER — HALOPERIDOL LACTATE 5 MG/ML IJ SOLN
5.0000 mg | Freq: Once | INTRAMUSCULAR | Status: AC
  Administered 2018-05-15: 5 mg via INTRAVENOUS
  Filled 2018-05-15: qty 1
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
## 2018-05-15 NOTE — ED Notes (Signed)
Bed: TM22 Expected date:  Expected time:  Means of arrival:  Comments: SI, heroin and cocaine

## 2018-05-15 NOTE — ED Triage Notes (Signed)
Pt brought in for laying in the road, stating she wants to kill herself. 5mg  versed given, therefore unable to answer any questions at this time. Pt admitted to crack and heroine use tonight.

## 2018-05-16 LAB — RAPID URINE DRUG SCREEN, HOSP PERFORMED
AMPHETAMINES: NOT DETECTED
Barbiturates: NOT DETECTED
Benzodiazepines: POSITIVE — AB
Cocaine: POSITIVE — AB
Opiates: POSITIVE — AB
TETRAHYDROCANNABINOL: POSITIVE — AB

## 2018-05-16 NOTE — BH Assessment (Signed)
[facility] Assessment Progress Note  Per [name], DO, this pt does not require psychiatric hospitalization at this time.  Pt is to be discharged from [facility] with recommendation to follow up with [facility].  This has been included in pt's discharge instructions.  Pt's nurse has been notified.  Chriss Mannan, MA Triage Specialist [phone]     

## 2018-05-16 NOTE — ED Notes (Signed)
RN attempted to do a suicide shift assessment with patient. Pt aggravated with nurse and dozing off between questions and not giving appropriate answers. RN will attempt to reassess later patients emotional and mental well being. Pt's sitter is attempting to get patient to eat her breakfast tray at this time.

## 2018-05-16 NOTE — Discharge Instructions (Signed)
For your mental health needs, you are advised to follow up with [facility].  New and returning patients are seen at their walk-in clinic.  Walk-in hours are Monday - Friday from 8:00 am - 3:00 pm.  Walk-in patients are seen on a first come, first served basis.  Try to arrive as early as possible for the best chance of being seen the same day: ° °     [facility] °     [address] °     [place], [place] [address] °     [phone] °
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
## 2018-05-16 NOTE — ED Notes (Signed)
Psych team at bedside .
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
## 2018-05-16 NOTE — Progress Notes (Signed)
Per Jeanice Lim, Nurse note:  "Pt brought in for laying in the road, stating she wants to kill herself. 5mg  versed given, therefore unable to answer any questions at this time. Pt admitted to crack and heroine use tonight."   TTS will attempt to assess later.

## 2018-05-16 NOTE — ED Notes (Signed)
Pt sitting up in bed, drowsy on approach, dozing off at times, acknowledged she was tired, but forwards little with this nurse. noted pt ate 75% of breakfast tray. Safety Sitter present. Will continue to monitor.

## 2018-05-16 NOTE — ED Notes (Signed)
Pt dressed into burgundy scrubs and belongings placed into nurses station cabinets.

## 2018-06-24 DEATH — deceased

## 2018-09-19 ENCOUNTER — Encounter: Payer: Self-pay | Admitting: *Deleted

## 2019-12-12 IMAGING — CT CT HEAD W/O CM
4 series · 17 of 47 positions shown, 19 images · non-contrast
Comparison: 09/01/2017

CLINICAL DATA: Headache fall

EXAM:
CT HEAD WITHOUT CONTRAST
TECHNIQUE: Contiguous axial images were obtained from the base of the skull
through the vertex without intravenous contrast.

[Series 3: head without · axial · non-contrast · 0.44mm/px · z∈[+971,+1091]mm · 7 of 34 slices shown, 9 images]
[im 5/34  brain]
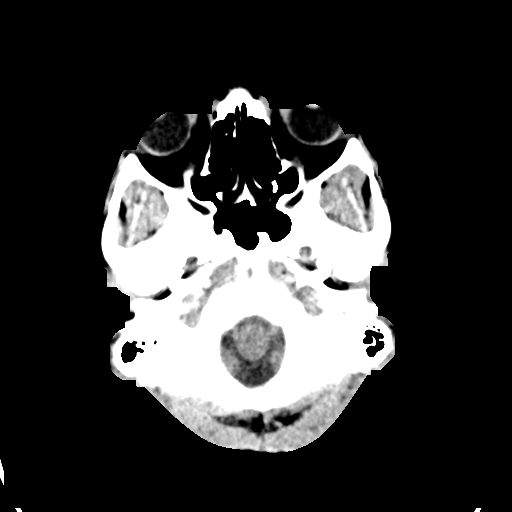
[im 5/34  bone]
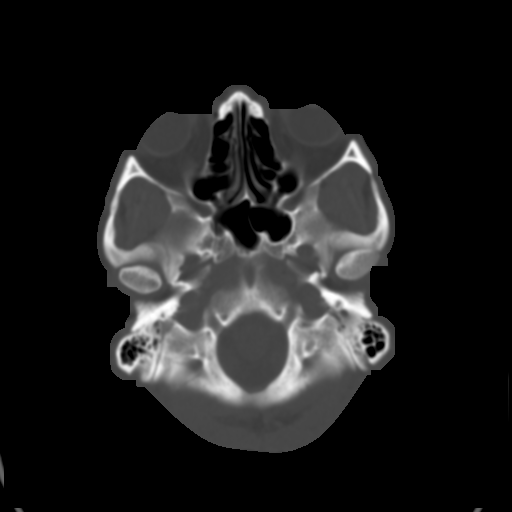
[im 9/34  brain]
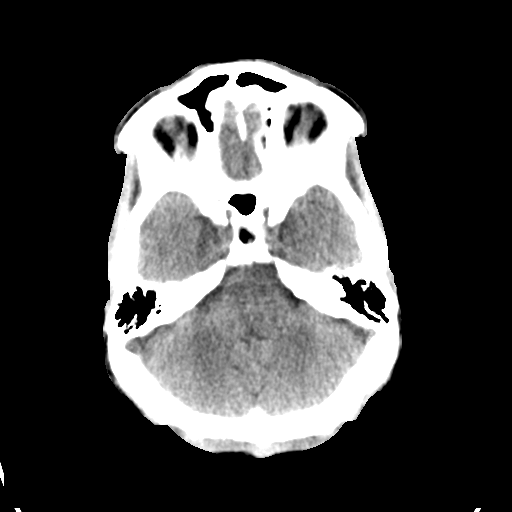
[im 13/34  brain]
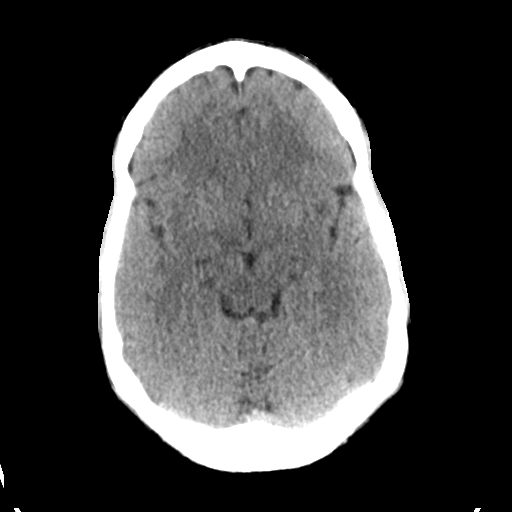
[im 17/34  brain]
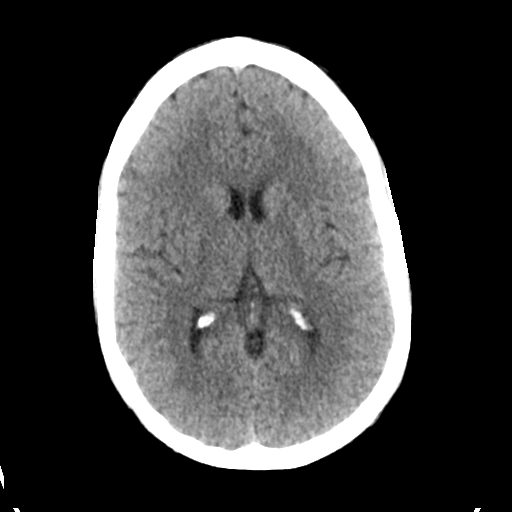
[im 21/34  brain]
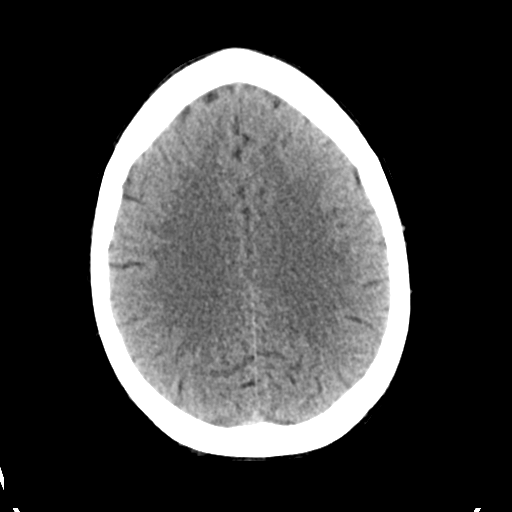
[im 21/34  bone]
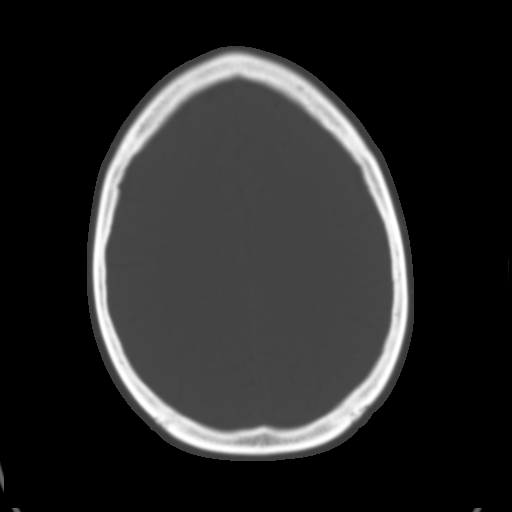
[im 25/34  brain]
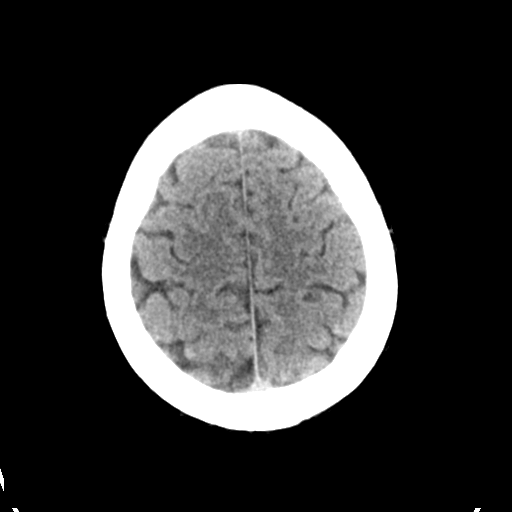
[im 29/34  brain]
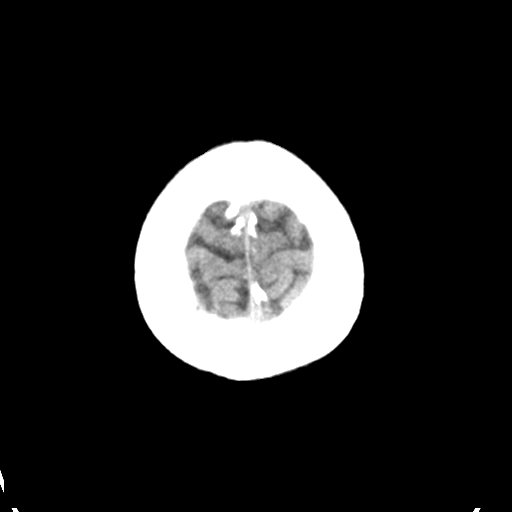

[Series 4: head bone · axial · 0.44mm/px · z∈[+967,+1025]mm · 4 of 84 slices shown]
[im 9/84  bone]
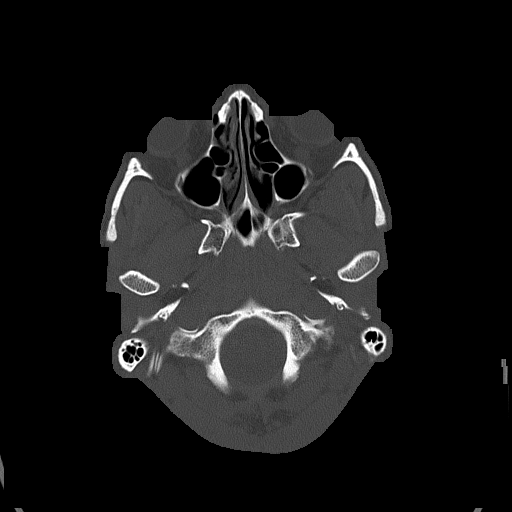
[im 17/84  bone]
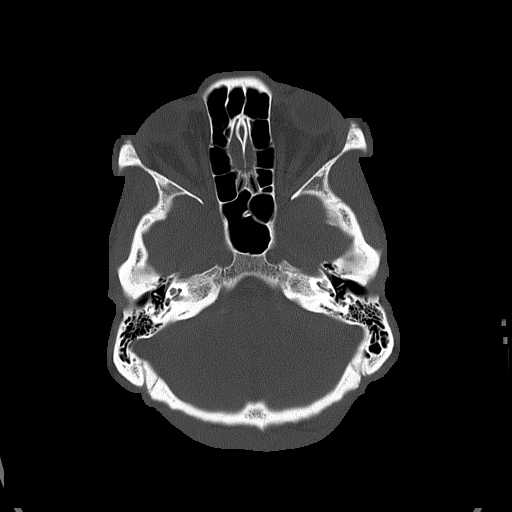
[im 25/84  bone]
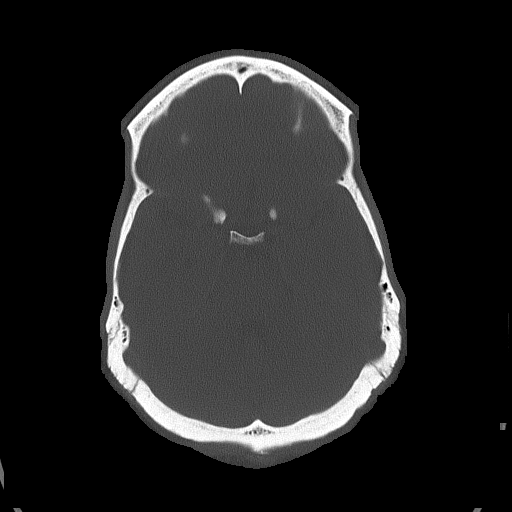
[im 38/84  bone]
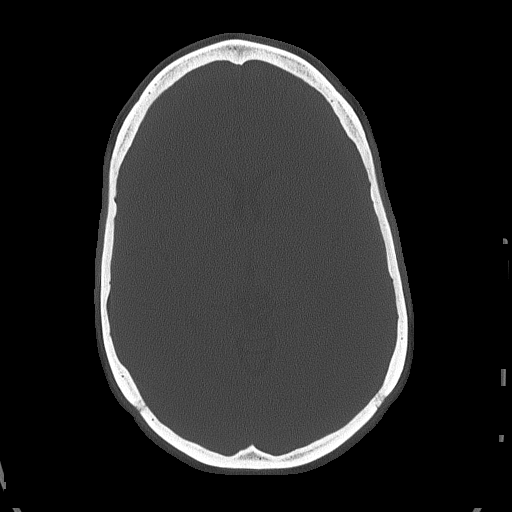

[Series 5: head without cor · coronal · non-contrast · 0.36mm/px · 3 of 75 slices shown]
[im 25/75  brain]
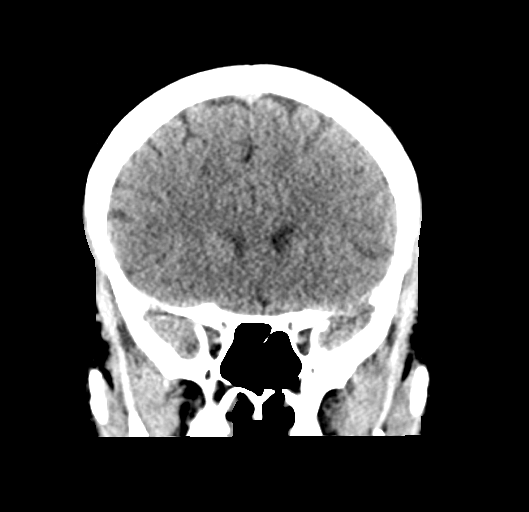
[im 33/75  brain]
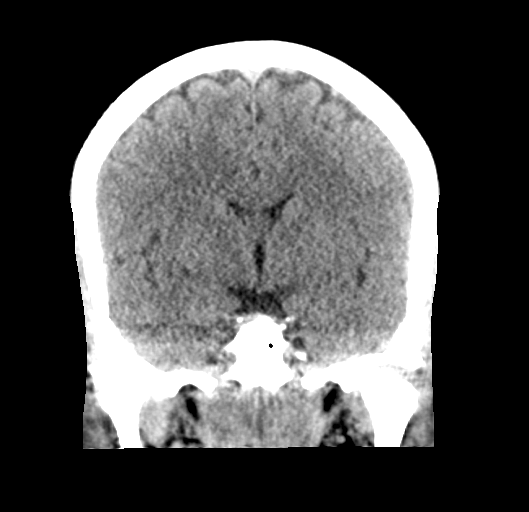
[im 42/75  brain]
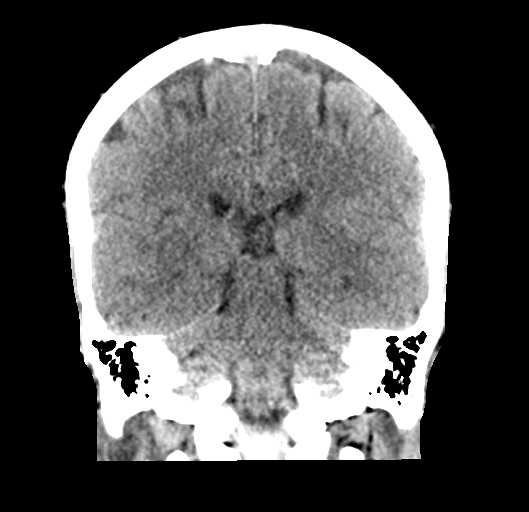

[Series 6: head without sag · sagittal · non-contrast · 0.35mm/px · 3 of 57 slices shown]
[im 19/57  brain]
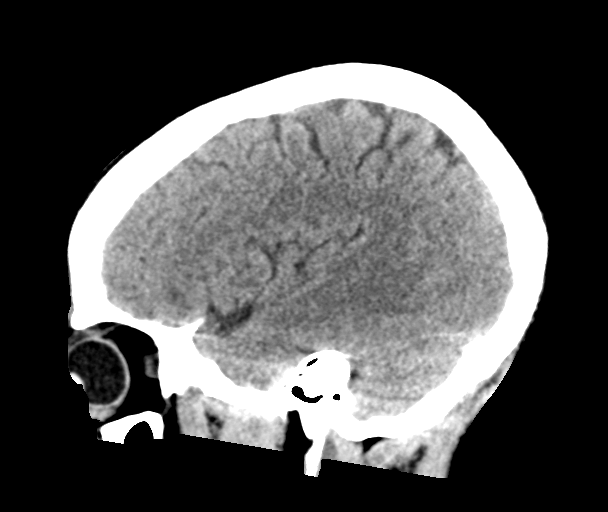
[im 29/57  brain]
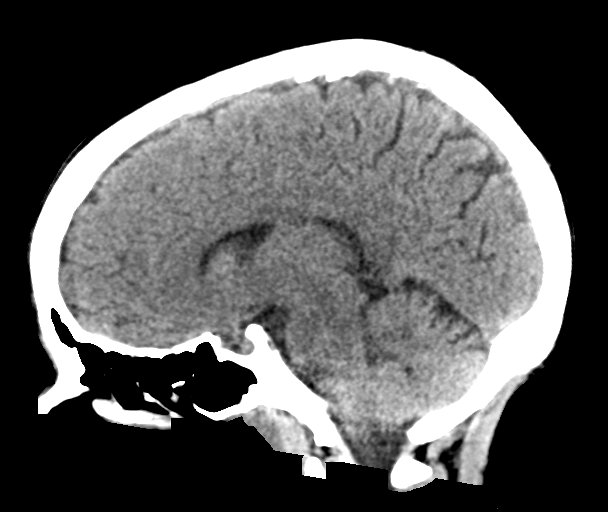
[im 38/57  brain]
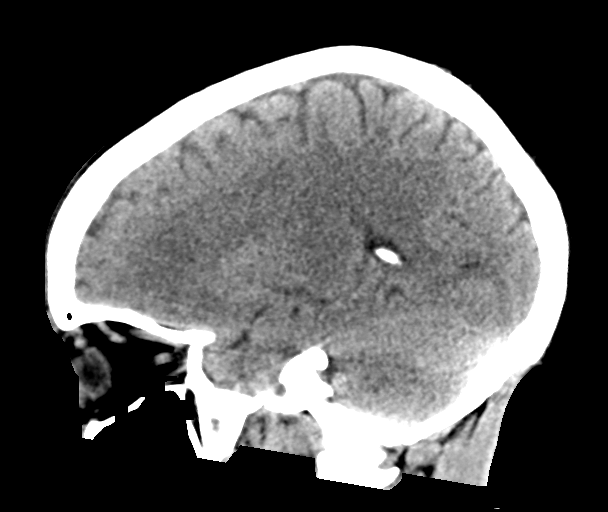

[17 of 47 positions shown; findings below may reference images not displayed]

FINDINGS: Brain: No evidence of acute infarction, hemorrhage, hydrocephalus,
extra-axial collection or mass lesion/mass effect.

Vascular: No hyperdense vessel or unexpected calcification.

Skull: Normal. Negative for fracture or focal lesion.

Sinuses/Orbits: Mucosal thickening in the maxillary and ethmoid
sinuses.

Other: None
IMPRESSION: Negative non contrasted CT appearance of the brain
